# Patient Record
Sex: Female | Born: 1960 | Race: White | Hispanic: No | Marital: Single | Smoking: Current every day smoker
Health system: Southern US, Community
[De-identification: ages and names within clinical notes are randomized; demographics above are authoritative.]

## PROBLEM LIST (undated history)

## (undated) DIAGNOSIS — F329 Major depressive disorder, single episode, unspecified: Secondary | ICD-10-CM

## (undated) DIAGNOSIS — J329 Chronic sinusitis, unspecified: Secondary | ICD-10-CM

## (undated) DIAGNOSIS — T7840XA Allergy, unspecified, initial encounter: Secondary | ICD-10-CM

## (undated) DIAGNOSIS — R1115 Cyclical vomiting syndrome unrelated to migraine: Secondary | ICD-10-CM

## (undated) DIAGNOSIS — N951 Menopausal and female climacteric states: Secondary | ICD-10-CM

## (undated) DIAGNOSIS — F32A Depression, unspecified: Secondary | ICD-10-CM

## (undated) DIAGNOSIS — K219 Gastro-esophageal reflux disease without esophagitis: Secondary | ICD-10-CM

## (undated) DIAGNOSIS — N879 Dysplasia of cervix uteri, unspecified: Secondary | ICD-10-CM

## (undated) DIAGNOSIS — L309 Dermatitis, unspecified: Secondary | ICD-10-CM

## (undated) DIAGNOSIS — G43909 Migraine, unspecified, not intractable, without status migrainosus: Secondary | ICD-10-CM

## (undated) DIAGNOSIS — M5030 Other cervical disc degeneration, unspecified cervical region: Secondary | ICD-10-CM

## (undated) DIAGNOSIS — R112 Nausea with vomiting, unspecified: Secondary | ICD-10-CM

## (undated) DIAGNOSIS — Z9889 Other specified postprocedural states: Secondary | ICD-10-CM

## (undated) DIAGNOSIS — I10 Essential (primary) hypertension: Secondary | ICD-10-CM

## (undated) DIAGNOSIS — M1990 Unspecified osteoarthritis, unspecified site: Secondary | ICD-10-CM

## (undated) HISTORY — DX: Other cervical disc degeneration, unspecified cervical region: M50.30

## (undated) HISTORY — DX: Dysplasia of cervix uteri, unspecified: N87.9

## (undated) HISTORY — DX: Major depressive disorder, single episode, unspecified: F32.9

## (undated) HISTORY — PX: CERVICAL BIOPSY: SHX590

## (undated) HISTORY — DX: Gastro-esophageal reflux disease without esophagitis: K21.9

## (undated) HISTORY — DX: Unspecified osteoarthritis, unspecified site: M19.90

## (undated) HISTORY — PX: COLPOSCOPY: SHX161

## (undated) HISTORY — DX: Depression, unspecified: F32.A

## (undated) HISTORY — DX: Dermatitis, unspecified: L30.9

## (undated) HISTORY — DX: Allergy, unspecified, initial encounter: T78.40XA

## (undated) HISTORY — DX: Chronic sinusitis, unspecified: J32.9

## (undated) HISTORY — PX: KNEE SURGERY: SHX244

## (undated) HISTORY — PX: COLONOSCOPY W/ POLYPECTOMY: SHX1380

## (undated) HISTORY — DX: Migraine, unspecified, not intractable, without status migrainosus: G43.909

## (undated) HISTORY — DX: Menopausal and female climacteric states: N95.1

## (undated) HISTORY — PX: NECK SURGERY: SHX720

---

## 1988-11-18 HISTORY — PX: TONSILLECTOMY: SHX5217

## 1990-11-18 HISTORY — PX: NASAL SINUS SURGERY: SHX719

## 1998-06-16 ENCOUNTER — Other Ambulatory Visit: Admission: RE | Admit: 1998-06-16 | Discharge: 1998-06-16 | Payer: Self-pay | Admitting: *Deleted

## 1999-08-02 ENCOUNTER — Other Ambulatory Visit: Admission: RE | Admit: 1999-08-02 | Discharge: 1999-08-02 | Payer: Self-pay | Admitting: *Deleted

## 2001-05-23 ENCOUNTER — Encounter: Payer: Self-pay | Admitting: Emergency Medicine

## 2001-05-23 ENCOUNTER — Emergency Department (HOSPITAL_COMMUNITY): Admission: EM | Admit: 2001-05-23 | Discharge: 2001-05-23 | Payer: Self-pay

## 2001-05-26 ENCOUNTER — Other Ambulatory Visit: Admission: RE | Admit: 2001-05-26 | Discharge: 2001-05-26 | Payer: Self-pay

## 2002-04-17 ENCOUNTER — Emergency Department (HOSPITAL_COMMUNITY): Admission: EM | Admit: 2002-04-17 | Discharge: 2002-04-17 | Payer: Self-pay | Admitting: Emergency Medicine

## 2002-06-28 ENCOUNTER — Other Ambulatory Visit: Admission: RE | Admit: 2002-06-28 | Discharge: 2002-06-28 | Payer: Self-pay | Admitting: Obstetrics and Gynecology

## 2004-03-12 ENCOUNTER — Emergency Department (HOSPITAL_COMMUNITY): Admission: EM | Admit: 2004-03-12 | Discharge: 2004-03-12 | Payer: Self-pay

## 2005-09-17 ENCOUNTER — Other Ambulatory Visit: Admission: RE | Admit: 2005-09-17 | Discharge: 2005-09-17 | Payer: Self-pay | Admitting: Obstetrics and Gynecology

## 2006-04-29 ENCOUNTER — Other Ambulatory Visit: Admission: RE | Admit: 2006-04-29 | Discharge: 2006-04-29 | Payer: Self-pay | Admitting: Obstetrics and Gynecology

## 2008-06-15 ENCOUNTER — Other Ambulatory Visit: Admission: RE | Admit: 2008-06-15 | Discharge: 2008-06-15 | Payer: Self-pay | Admitting: Obstetrics and Gynecology

## 2008-11-15 ENCOUNTER — Encounter: Admission: RE | Admit: 2008-11-15 | Discharge: 2008-11-15 | Payer: Self-pay | Admitting: Internal Medicine

## 2009-05-30 ENCOUNTER — Ambulatory Visit: Payer: Self-pay | Admitting: Obstetrics and Gynecology

## 2009-07-26 ENCOUNTER — Other Ambulatory Visit: Admission: RE | Admit: 2009-07-26 | Discharge: 2009-07-26 | Payer: Self-pay | Admitting: Obstetrics and Gynecology

## 2009-07-26 ENCOUNTER — Ambulatory Visit: Payer: Self-pay | Admitting: Obstetrics and Gynecology

## 2009-07-26 ENCOUNTER — Encounter: Payer: Self-pay | Admitting: Obstetrics and Gynecology

## 2010-04-26 ENCOUNTER — Ambulatory Visit: Payer: Self-pay | Admitting: Obstetrics and Gynecology

## 2010-08-02 ENCOUNTER — Ambulatory Visit: Payer: Self-pay | Admitting: Obstetrics and Gynecology

## 2010-10-30 ENCOUNTER — Ambulatory Visit: Payer: Self-pay | Admitting: Obstetrics and Gynecology

## 2010-12-05 ENCOUNTER — Ambulatory Visit
Admission: RE | Admit: 2010-12-05 | Discharge: 2010-12-05 | Payer: Self-pay | Source: Home / Self Care | Attending: Obstetrics and Gynecology | Admitting: Obstetrics and Gynecology

## 2010-12-05 ENCOUNTER — Other Ambulatory Visit
Admission: RE | Admit: 2010-12-05 | Discharge: 2010-12-05 | Payer: Self-pay | Source: Home / Self Care | Admitting: Obstetrics and Gynecology

## 2010-12-05 ENCOUNTER — Other Ambulatory Visit: Payer: Self-pay | Admitting: Obstetrics and Gynecology

## 2011-05-10 ENCOUNTER — Other Ambulatory Visit: Payer: Self-pay | Admitting: Obstetrics and Gynecology

## 2011-06-10 ENCOUNTER — Other Ambulatory Visit: Payer: Self-pay

## 2011-06-10 ENCOUNTER — Other Ambulatory Visit: Payer: Self-pay | Admitting: Obstetrics and Gynecology

## 2011-06-10 DIAGNOSIS — Z139 Encounter for screening, unspecified: Secondary | ICD-10-CM

## 2011-06-11 DIAGNOSIS — G43909 Migraine, unspecified, not intractable, without status migrainosus: Secondary | ICD-10-CM | POA: Insufficient documentation

## 2011-06-11 DIAGNOSIS — N951 Menopausal and female climacteric states: Secondary | ICD-10-CM | POA: Insufficient documentation

## 2011-06-12 ENCOUNTER — Encounter: Payer: Self-pay | Admitting: Obstetrics and Gynecology

## 2011-06-21 ENCOUNTER — Encounter: Payer: Self-pay | Admitting: Obstetrics and Gynecology

## 2011-07-08 ENCOUNTER — Other Ambulatory Visit: Payer: Self-pay | Admitting: Obstetrics and Gynecology

## 2011-07-08 DIAGNOSIS — F418 Other specified anxiety disorders: Secondary | ICD-10-CM

## 2011-07-09 NOTE — Telephone Encounter (Signed)
Refill on alprazolam 0.25 #30 called to [facility] [phone] no refills per TF

## 2011-07-10 ENCOUNTER — Other Ambulatory Visit: Payer: Self-pay | Admitting: *Deleted

## 2011-08-20 ENCOUNTER — Other Ambulatory Visit: Payer: Self-pay | Admitting: Gynecology

## 2011-09-26 ENCOUNTER — Ambulatory Visit (HOSPITAL_COMMUNITY): Admit: 2011-09-26 | Payer: Self-pay | Admitting: Gastroenterology

## 2011-09-26 ENCOUNTER — Encounter (HOSPITAL_COMMUNITY): Payer: Self-pay

## 2011-09-26 SURGERY — COLONOSCOPY
Anesthesia: Moderate Sedation

## 2011-10-22 ENCOUNTER — Other Ambulatory Visit: Payer: Self-pay | Admitting: Obstetrics and Gynecology

## 2011-11-15 ENCOUNTER — Other Ambulatory Visit: Payer: Self-pay | Admitting: Obstetrics and Gynecology

## 2011-11-15 NOTE — Telephone Encounter (Signed)
rx called in

## 2011-12-23 ENCOUNTER — Other Ambulatory Visit: Payer: Self-pay | Admitting: Obstetrics and Gynecology

## 2012-01-06 ENCOUNTER — Other Ambulatory Visit (HOSPITAL_COMMUNITY)
Admission: RE | Admit: 2012-01-06 | Discharge: 2012-01-06 | Disposition: A | Discharge status: Home / Self Care | Payer: BC Managed Care – PPO | Source: Ambulatory Visit | Attending: Obstetrics and Gynecology | Admitting: Obstetrics and Gynecology

## 2012-01-06 ENCOUNTER — Ambulatory Visit (INDEPENDENT_AMBULATORY_CARE_PROVIDER_SITE_OTHER): Payer: BC Managed Care – PPO | Admitting: Obstetrics and Gynecology

## 2012-01-06 ENCOUNTER — Encounter: Payer: Self-pay | Admitting: Obstetrics and Gynecology

## 2012-01-06 VITALS — BP 130/90 | Ht 65.0 in | Wt 182.0 lb

## 2012-01-06 DIAGNOSIS — L309 Dermatitis, unspecified: Secondary | ICD-10-CM | POA: Insufficient documentation

## 2012-01-06 DIAGNOSIS — Z01419 Encounter for gynecological examination (general) (routine) without abnormal findings: Secondary | ICD-10-CM | POA: Insufficient documentation

## 2012-01-06 DIAGNOSIS — M8430XA Stress fracture, unspecified site, initial encounter for fracture: Secondary | ICD-10-CM

## 2012-01-06 DIAGNOSIS — Z13820 Encounter for screening for osteoporosis: Secondary | ICD-10-CM

## 2012-01-06 DIAGNOSIS — F329 Major depressive disorder, single episode, unspecified: Secondary | ICD-10-CM | POA: Insufficient documentation

## 2012-01-06 DIAGNOSIS — N879 Dysplasia of cervix uteri, unspecified: Secondary | ICD-10-CM | POA: Insufficient documentation

## 2012-01-06 DIAGNOSIS — J45909 Unspecified asthma, uncomplicated: Secondary | ICD-10-CM | POA: Insufficient documentation

## 2012-01-06 DIAGNOSIS — F32A Depression, unspecified: Secondary | ICD-10-CM | POA: Insufficient documentation

## 2012-01-06 DIAGNOSIS — M84378A Stress fracture, left toe(s), initial encounter for fracture: Secondary | ICD-10-CM

## 2012-01-06 LAB — URINALYSIS W MICROSCOPIC + REFLEX CULTURE
Bilirubin Urine: NEGATIVE
Casts: NONE SEEN
Glucose, UA: NEGATIVE mg/dL
Leukocytes, UA: NEGATIVE
Protein, ur: NEGATIVE mg/dL
WBC, UA: NONE SEEN WBC/hpf (ref <3)
pH: 7 (ref 5.0–8.0)

## 2012-01-06 NOTE — Progress Notes (Signed)
Patient came to see me today for her annual GYN exam. She is much happier on the Evamist then she was on oral estrogen. She uses 3 sprays a day. She does notice when she wakes up that she is more emotional. It goes away when she takes her Evamist in the morning. She is having no bleeding with her progesterone. She is having no vaginal dryness. She just saw her PCP and had all her lab work done. Her blood pressure goes up when she smokes and she is trying to stop again. She is up-to-date on mammograms. She had a bone density 11 years ago after stress fracture of her toe. She has not had one since then. She is having no pelvic pain.  Physical examination: Kennon Portela present. HEENT within normal limits. Neck: Thyroid not large. No masses. Supraclavicular nodes: not enlarged. Breasts: Examined in both sitting midline position. No skin changes and no masses. Abdomen: Soft no guarding rebound or masses or hernia. Pelvic: External: Within normal limits. BUS: Within normal limits. Vaginal:within normal limits. Good estrogen effect. No evidence of cystocele rectocele or enterocele. Cervix: clean. Uterus: Normal size and shape. Adnexa: No masses. Rectovaginal exam: Confirmatory and negative. Extremities: Within normal limits.  Assessment: Menopausal symptoms  Plan: She will do 2 sprays of estrogen in the morning and one at night to try to eliminate the emotional response in the morning. She will continue her progesterone. She was scheduled bone density.

## 2012-01-09 ENCOUNTER — Ambulatory Visit (INDEPENDENT_AMBULATORY_CARE_PROVIDER_SITE_OTHER): Payer: BC Managed Care – PPO

## 2012-01-09 DIAGNOSIS — M949 Disorder of cartilage, unspecified: Secondary | ICD-10-CM

## 2012-01-09 DIAGNOSIS — Z13820 Encounter for screening for osteoporosis: Secondary | ICD-10-CM

## 2012-01-09 DIAGNOSIS — M8580 Other specified disorders of bone density and structure, unspecified site: Secondary | ICD-10-CM

## 2012-01-09 DIAGNOSIS — M899 Disorder of bone, unspecified: Secondary | ICD-10-CM

## 2012-01-14 ENCOUNTER — Other Ambulatory Visit: Payer: Self-pay | Admitting: Gastroenterology

## 2012-01-16 ENCOUNTER — Other Ambulatory Visit: Payer: Self-pay | Admitting: Obstetrics and Gynecology

## 2012-01-20 ENCOUNTER — Other Ambulatory Visit: Payer: Self-pay | Admitting: Obstetrics and Gynecology

## 2012-01-21 NOTE — Telephone Encounter (Signed)
rx called in

## 2012-04-01 ENCOUNTER — Other Ambulatory Visit (INDEPENDENT_AMBULATORY_CARE_PROVIDER_SITE_OTHER): Payer: Self-pay | Admitting: Otolaryngology

## 2012-04-01 DIAGNOSIS — R1310 Dysphagia, unspecified: Secondary | ICD-10-CM

## 2012-04-03 ENCOUNTER — Other Ambulatory Visit (HOSPITAL_COMMUNITY): Payer: BC Managed Care – PPO

## 2012-04-06 ENCOUNTER — Other Ambulatory Visit (HOSPITAL_COMMUNITY): Payer: BC Managed Care – PPO

## 2012-04-10 ENCOUNTER — Ambulatory Visit (HOSPITAL_COMMUNITY)
Admission: RE | Admit: 2012-04-10 | Discharge: 2012-04-10 | Disposition: A | Discharge status: Home / Self Care | Payer: BC Managed Care – PPO | Source: Ambulatory Visit | Attending: Otolaryngology | Admitting: Otolaryngology

## 2012-04-10 DIAGNOSIS — K449 Diaphragmatic hernia without obstruction or gangrene: Secondary | ICD-10-CM | POA: Insufficient documentation

## 2012-04-10 DIAGNOSIS — K224 Dyskinesia of esophagus: Secondary | ICD-10-CM | POA: Insufficient documentation

## 2012-04-10 DIAGNOSIS — R1310 Dysphagia, unspecified: Secondary | ICD-10-CM | POA: Insufficient documentation

## 2012-04-25 ENCOUNTER — Emergency Department (HOSPITAL_COMMUNITY)
Admission: EM | Admit: 2012-04-25 | Discharge: 2012-04-25 | Disposition: A | Discharge status: Home / Self Care | Payer: BC Managed Care – PPO | Attending: Emergency Medicine | Admitting: Emergency Medicine

## 2012-04-25 DIAGNOSIS — R1013 Epigastric pain: Secondary | ICD-10-CM | POA: Insufficient documentation

## 2012-04-25 DIAGNOSIS — F17200 Nicotine dependence, unspecified, uncomplicated: Secondary | ICD-10-CM | POA: Insufficient documentation

## 2012-04-25 DIAGNOSIS — L259 Unspecified contact dermatitis, unspecified cause: Secondary | ICD-10-CM | POA: Insufficient documentation

## 2012-04-25 DIAGNOSIS — G43909 Migraine, unspecified, not intractable, without status migrainosus: Secondary | ICD-10-CM | POA: Insufficient documentation

## 2012-04-25 DIAGNOSIS — Z79899 Other long term (current) drug therapy: Secondary | ICD-10-CM | POA: Insufficient documentation

## 2012-04-25 DIAGNOSIS — J45909 Unspecified asthma, uncomplicated: Secondary | ICD-10-CM | POA: Insufficient documentation

## 2012-04-25 DIAGNOSIS — R1115 Cyclical vomiting syndrome unrelated to migraine: Secondary | ICD-10-CM | POA: Insufficient documentation

## 2012-04-25 LAB — URINE MICROSCOPIC-ADD ON

## 2012-04-25 LAB — CBC
HCT: 46.4 % — ABNORMAL HIGH (ref 36.0–46.0)
Hemoglobin: 16.4 g/dL — ABNORMAL HIGH (ref 12.0–15.0)
MCHC: 35.3 g/dL (ref 30.0–36.0)
RDW: 13.8 % (ref 11.5–15.5)
WBC: 16 10*3/uL — ABNORMAL HIGH (ref 4.0–10.5)

## 2012-04-25 LAB — COMPREHENSIVE METABOLIC PANEL
ALT: 33 U/L (ref 0–35)
AST: 26 U/L (ref 0–37)
Albumin: 4.5 g/dL (ref 3.5–5.2)
Alkaline Phosphatase: 77 U/L (ref 39–117)
CO2: 19 mEq/L (ref 19–32)
Chloride: 99 mEq/L (ref 96–112)
GFR calc non Af Amer: >90 mL/min (ref >90)
Potassium: 3.9 mEq/L (ref 3.5–5.1)
Total Bilirubin: 0.6 mg/dL (ref 0.3–1.2)

## 2012-04-25 LAB — DIFFERENTIAL
Basophils Absolute: 0 10*3/uL (ref 0.0–0.1)
Basophils Relative: 0 % (ref 0–1)
Lymphocytes Relative: 11 % — ABNORMAL LOW (ref 12–46)
Neutro Abs: 13.5 10*3/uL — ABNORMAL HIGH (ref 1.7–7.7)
Neutrophils Relative %: 84 % — ABNORMAL HIGH (ref 43–77)

## 2012-04-25 LAB — URINALYSIS, ROUTINE W REFLEX MICROSCOPIC
Bilirubin Urine: NEGATIVE
Glucose, UA: NEGATIVE mg/dL
Hgb urine dipstick: NEGATIVE
Ketones, ur: >80 mg/dL — AB
Leukocytes, UA: NEGATIVE
pH: 8.5 — ABNORMAL HIGH (ref 5.0–8.0)

## 2012-04-25 MED ORDER — HYDROMORPHONE HCL PF 1 MG/ML IJ SOLN
1.0000 mg | Freq: Once | INTRAMUSCULAR | Status: AC
  Administered 2012-04-25: 1 mg via INTRAVENOUS
  Filled 2012-04-25: qty 1

## 2012-04-25 MED ORDER — PROMETHAZINE HCL 25 MG PO TABS: 25.0000 mg | ORAL_TABLET | Freq: Four times a day (QID) | ORAL | Status: DC | PRN

## 2012-04-25 MED ORDER — SODIUM CHLORIDE 0.9 % IV BOLUS (SEPSIS)
1000.0000 mL | Freq: Once | INTRAVENOUS | Status: AC
  Administered 2012-04-25: 1000 mL via INTRAVENOUS

## 2012-04-25 MED ORDER — ONDANSETRON HCL 4 MG/2ML IJ SOLN
4.0000 mg | Freq: Once | INTRAMUSCULAR | Status: AC
  Administered 2012-04-25: 4 mg via INTRAVENOUS
  Filled 2012-04-25: qty 2

## 2012-04-25 MED ORDER — SODIUM CHLORIDE 0.9 % IV SOLN: Freq: Once | INTRAVENOUS | Status: DC

## 2012-04-25 MED ORDER — PANTOPRAZOLE SODIUM 40 MG IV SOLR
40.0000 mg | Freq: Once | INTRAVENOUS | Status: AC
  Administered 2012-04-25: 40 mg via INTRAVENOUS
  Filled 2012-04-25: qty 40

## 2012-04-25 NOTE — ED Notes (Signed)
Pt reports a history of cyclic vomiting. Pt reports symptoms always present in the same fashion. Pt reports vomiting, nausea and central upper abdominal pain began this AM and has persisted until now. Pt actively vomiting. Pt diaphoretic, tachypenic and anxious. Pt stating "please help me" and reports feeling weak. Pt reports dilaudid and an antinausea medication are the only thing that help her symptoms. Pt denies taking medications for pain or nausea prior to arrival at hospital. Pt reports her diagnosis of cyclic vomiting was made after ruling out pancreatitis.

## 2012-04-25 NOTE — ED Provider Notes (Signed)
History     CSN: [id number]  Arrival date & time 04/25/12  1356   First MD Initiated Contact with Patient 04/25/12 1424      Chief Complaint  Patient presents with  . Emesis    "cyclic vomiting"    (Consider location/radiation/quality/duration/timing/severity/associated sxs/prior treatment) Patient is a 51 y.o. female presenting with vomiting. The history is provided by the patient.  Emesis   She had onset this morning of epigastric pain and vomiting with diaphoresis. Pain is moderate to severe and she rates it at 8/10. She denies diarrhea. This is typical of a flare up of her cyclic vomiting syndrome. She has not had a flareup in quite some time. She has not taken anything to try and help her symptoms. Nothing makes her feel better and nothing makes her feel worse. Epigastric pain does radiate to the back and into her chest.  Past Medical History  Diagnosis Date  . Menopausal symptoms   . Migraines   . Recurrent sinus infections   . Depression   . Asthma   . Eczema   . Cervical dysplasia     Past Surgical History  Procedure Date  . Tonsillectomy 1990  . Nasal sinus surgery 1992  . Colposcopy     Family History  Problem Relation Age of Onset  . Heart disease Mother   . Hypertension Father   . Breast cancer Maternal Aunt   . Diabetes Paternal Grandfather   . Heart disease Paternal Grandfather     History  Substance Use Topics  . Smoking status: Current Everyday Smoker -- 1.5 packs/day    Types: Cigarettes  . Smokeless tobacco: Not on file  . Alcohol Use: Yes     rare    OB History    Grav Para Term Preterm Abortions TAB SAB Ect Mult Living   1    1           Review of Systems  Gastrointestinal: Positive for vomiting.  All other systems reviewed and are negative.    Allergies  Avelox  Home Medications   Current Outpatient Rx  Name Route Sig Dispense Refill  . ALPRAZOLAM 0.25 MG PO TABS  TAKE ONE TABLET BY MOUTH EVERY 8 HOURS AS NEEDED 30 tablet  3  . CARISOPRODOL 350 MG PO TABS Oral Take 350 mg by mouth as needed.    Marland Kitchen ZYRTEC PO Oral Take by mouth.      . ESTRADIOL 2 MG PO TABS Oral Take 2 mg by mouth daily.      Marland Kitchen EVAMIST 1.53 MG/SPRAY TD SOLN  USE 3 SPRAYS DAILY AS DIRECTED ON INSIDE OF FOREARM 9 mL 11  . MEDROXYPROGESTERONE ACETATE 5 MG PO TABS Oral Take 5 mg by mouth daily. Daily day 1-12 of every month.     Marland Kitchen NICOTINE POLACRILEX 4 MG MT GUM Oral Take 4 mg by mouth as needed.      Marland Kitchen PERCOCET PO Oral Take by mouth. prn     . SUDAFED 12 HOUR PO Oral Take by mouth.    . IMITREX PO Oral Take by mouth.      . VENLAFAXINE HCL 75 MG PO TABS Oral Take 75 mg by mouth 2 (two) times daily.       BP 142/81  Pulse 112  Temp(Src) 98.4 F (36.9 C) (Oral)  Resp 22  Ht 5\' 5"  (1.651 m)  Wt 185 lb (83.915 kg)  BMI 30.79 kg/m2  SpO2 100%  Physical Exam  Nursing  note and vitals reviewed.  51 year old female who appears uncomfortable. Vital signs are significant for tachycardia with heart rate of 112, tachypnea with respiratory rate of 22, and borderline systolic hypertension with blood pressure 142/81. Oxygen saturation is 100% which is normal. Head is normocephalic and atraumatic. PERRLA, EOMI. There is no scleral icterus. Neck is nontender and supple. Back is nontender. Lungs are clear without rales, wheezes, rhonchi. Heart has regular rate rhythm without murmur. Abdomen is soft, flat, with mild epigastric tenderness. There is no rebound or guarding. There is no hepatosplenomegaly. Peristalsis is decreased. There's no CVA tenderness. Extremities have no cyanosis or edema, full range of motion is present. Skin is warm and diaphoretic without rash. Neurologic: She is anxious and appears to be in pain. Cranial nerves are intact. There no motor or sensory deficits.  ED Course  Procedures (including critical care time)  Results for orders placed during the hospital encounter of 04/25/12  CBC      Component Value Range   WBC 16.0 (*) 4.0 - 10.5  (K/uL)   RBC 5.42 (*) 3.87 - 5.11 (MIL/uL)   Hemoglobin 16.4 (*) 12.0 - 15.0 (g/dL)   HCT 16.1 (*) 09.6 - 46.0 (%)   MCV 85.6  78.0 - 100.0 (fL)   MCH 30.3  26.0 - 34.0 (pg)   MCHC 35.3  30.0 - 36.0 (g/dL)   RDW 04.5  40.9 - 81.1 (%)   Platelets 332  150 - 400 (K/uL)  DIFFERENTIAL      Component Value Range   Neutrophils Relative 84 (*) 43 - 77 (%)   Neutro Abs 13.5 (*) 1.7 - 7.7 (K/uL)   Lymphocytes Relative 11 (*) 12 - 46 (%)   Lymphs Abs 1.7  0.7 - 4.0 (K/uL)   Monocytes Relative 3  3 - 12 (%)   Monocytes Absolute 0.5  0.1 - 1.0 (K/uL)   Eosinophils Relative 2  0 - 5 (%)   Eosinophils Absolute 0.3  0.0 - 0.7 (K/uL)   Basophils Relative 0  0 - 1 (%)   Basophils Absolute 0.0  0.0 - 0.1 (K/uL)  COMPREHENSIVE METABOLIC PANEL      Component Value Range   Sodium 135  135 - 145 (mEq/L)   Potassium 3.9  3.5 - 5.1 (mEq/L)   Chloride 99  96 - 112 (mEq/L)   CO2 19  19 - 32 (mEq/L)   Glucose, Bld 109 (*) 70 - 99 (mg/dL)   BUN 11  6 - 23 (mg/dL)   Creatinine, Ser 9.14  0.50 - 1.10 (mg/dL)   Calcium 9.9  8.4 - 78.2 (mg/dL)   Total Protein 7.9  6.0 - 8.3 (g/dL)   Albumin 4.5  3.5 - 5.2 (g/dL)   AST 26  0 - 37 (U/L)   ALT 33  0 - 35 (U/L)   Alkaline Phosphatase 77  39 - 117 (U/L)   Total Bilirubin 0.6  0.3 - 1.2 (mg/dL)   GFR calc non Af Amer >90  >90 (mL/min)   GFR calc Af Amer >90  >90 (mL/min)  LIPASE, BLOOD      Component Value Range   Lipase 13  11 - 59 (U/L)  URINALYSIS, ROUTINE W REFLEX MICROSCOPIC      Component Value Range   Color, Urine YELLOW  YELLOW    APPearance CLEAR  CLEAR    Specific Gravity, Urine 1.024  1.005 - 1.030    pH 8.5 (*) 5.0 - 8.0    Glucose, UA NEGATIVE  NEGATIVE (mg/dL)   Hgb urine dipstick NEGATIVE  NEGATIVE    Bilirubin Urine NEGATIVE  NEGATIVE    Ketones, ur >80 (*) NEGATIVE (mg/dL)   Protein, ur 30 (*) NEGATIVE (mg/dL)   Urobilinogen, UA 0.2  0.0 - 1.0 (mg/dL)   Nitrite NEGATIVE  NEGATIVE    Leukocytes, UA NEGATIVE  NEGATIVE   URINE  MICROSCOPIC-ADD ON      Component Value Range   Squamous Epithelial / LPF RARE  RARE    WBC, UA 0-2  <3 (WBC/hpf)   RBC / HPF 0-2  <3 (RBC/hpf)   Bacteria, UA RARE  RARE    Urine-Other MUCOUS PRESENT         1. Cyclic vomiting syndrome       MDM  Apparent exacerbation of cyclic vomiting syndrome. I reviewed her records and do not find any prior visits for same although she states that she is always a comes to the [facility] cone system for her flareups.  After IV fluids, IV ondansetron, IV pantoprazole, and IV hydromorphone, she feels much better and wants to try some oral fluids.  Laboratory workup is unremarkable. She will be sent home with a prescription for promethazine tablets.    Dione Booze, MD 04/25/12 [phone]

## 2012-04-25 NOTE — Discharge Instructions (Signed)
Cyclic Vomiting Syndrome Cyclic vomiting syndrome (CVS) is a benign condition in which patients experience bouts or cycles of severe nausea and vomiting that last for hours or even days. The bouts of nausea and vomiting alternate with longer periods of no symptoms and generally good health. CVS occurs mostly in children, but can affect adults. CVS has no known cause. Each episode is typically similar to the previous ones. The episodes tend to:   Start at about the same time of day.   Last the same length of time.   Present the same symptoms at the same level of intensity.  CVS can begin at any age in children and adults. CVS usually starts between the ages of 29 and 34. In adults, episodes tend to occur less often than they do in children, but they last longer. Furthermore, the events or situations that trigger episodes in adults cannot always be pinpointed as easily as they can in children. THE FOUR PHASES OF CVS 1. Prodrome.  2. Episode.  3. Recovery.  4. Symptom-free interval.  The prodrome phase signals that an episode of nausea and vomiting is about to begin. This phase can last from just a few minutes to several hours. This phase is often marked by belly (abdominal) pain. Sometimes taking medicine early in the prodrome phase can stop an episode in progress. However, sometimes there is no warning. A person may simply wake up in the middle of the night or early morning and begin vomiting. The episode phase consists of:  Severe vomiting.   Nausea.   Gagging (retching).  The recovery phase begins when the nausea and vomiting stop. Healthy color, appetite, and energy return. The symptom-free interval phase is the period between episodes when no symptoms are present. TRIGGERS Episodes can be triggered by an infection or event. Examples of triggers include:  Infections.   Colds, allergies, sinus problems, and the flu.   Eating certain foods such as chocolate or cheese.   Foods with MSG  or preservatives.   Fast foods.   Pre-packaged foods.   Foods with low nutritional value (junk foods).   Overeating.   Eating just before going to bed.   Hot weather.   Dehydration.   Not enough sleep or poor sleep quality.   Physical exhaustion.   Menstruation.   Motion sickness.   Emotional stress (school or home difficulties).   Excitement or stress.  SYMPTOMS  The main symptoms of CVS are:  Severe vomiting.   Nausea.   Gagging (retching).  Episodes usually begin at night or the first thing in the morning. Episodes may include vomiting or retching up to 5 or 6 times an hour during the worst of the episode. Episodes usually last anywhere from 1 to 4 days. Episodes can last for up to 10 days. Other symptoms include:  Paleness.   Exhaustion.   Listlessness.   Abdominal pain.   Loose stools or diarrhea.  Sometimes the nausea and vomiting are so severe that a person appears to be almost unconscious. Sensitivity to light, headache, fever, dizziness, may also accompany an episode. In addition, the vomiting may cause drooling and excessive thirst. Drinking water usually leads to more vomiting, though the water can dilute the acid in the vomit, making the episode a little less painful. Continuous vomiting can lead to dehydration, which means that the body has lost excessive water and salts. DIAGNOSIS  CVS is hard to diagnose because there are no clear tests to identify it. A caregiver must  diagnose CVS by looking at symptoms and medical history. A caregiver must exclude more common diseases or disorders that can also cause nausea and vomiting. Also, diagnosis takes time because caregivers need to identify a pattern or cycle to the vomiting. CVS AND MIGRAINE The relationship between migraine and CVS is still unclear. Medical researchers believe that they are related for 3 reasons: 1. Migraine headaches (which cause severe pain in the head), abdominal migraine (which causes  stomach pain), and CVS are all marked by severe symptoms that start quickly and end abruptly, followed by longer periods without pain or other symptoms.  2. Many of the situations that trigger CVS also trigger migraines. Those triggers include stress and excitement.  3. Research has shown that many children with CVS either have a family history of migraine or develop migraines as they grow older.  Because of the similarities between migraine and CVS, caregivers treat some people with severe CVS with drugs that are also used for migraine headaches. The drugs are designed to:  Prevent episodes.   Reduce their frequency.   Lessen their severity.  TREATMENT  CVS cannot be cured. Treatment varies, but people with CVS should get plenty of rest and sleep and take medications that prevent, stop, or lessen the vomiting episodes and other symptoms. Once a vomiting episode begins, treatment is supportive. It helps to stay in bed and sleep in a dark, quiet room. Severe nausea and vomiting may require hospitalization and intravenous (IV) fluids to prevent dehydration. Relaxing medications (sedatives) may help if the nausea continues. Sometimes, during the prodrome phase, it is possible to stop an episode from happening altogether. Only take over-the-counter or prescription medicines for pain, discomfort or fever as directed by your caregiver. Do not give aspirin to children. During the recovery phase, drinking water and replacing lost electrolytes (salts in the blood) are very important. Electrolytes are salts that the body needs to function well and stay healthy. Symptoms during the recovery phase can vary. Some people find that their appetites return to normal immediately, while others need to begin by drinking clear liquids and then move slowly to solid food. People whose episodes are frequent and long-lasting may be treated during the symptom-free intervals in an effort to prevent or ease future episodes.  Medications that help people with migraine headaches are sometimes used during this phase, but they do not work for everyone. Taking the medicine daily for 1 to 2 months may be necessary to see if it helps. The symptom-free phase is a good time to eliminate anything known to trigger an episode. For example, if episodes are brought on by stress or excitement, this period is the time to find ways to reduce stress and stay calm. If sinus problems or allergies cause episodes, those conditions should be treated. The triggers listed above should be avoided or prevented. RELATED COMPLICATIONS The severe vomiting that defines CVS is a risk factor for several complications:  Dehydration. Vomiting causes the body to lose water quickly.   Electrolyte imbalance. Vomiting also causes the body to lose the important salts it needs to keep working properly.   Peptic esophagitis. The tube that connects the mouth to the stomach (esophagus) becomes injured from the stomach acid that comes up with the vomit.   Hematemesis. The esophagus becomes irritated and bleeds, so blood mixes with the vomit.   Mallory-Weiss tear. The lower end of the esophagus may tear open or the stomach may bruise from vomiting or retching.   Tooth decay.  The acid in the vomit can hurt the teeth by corroding the tooth enamel.  SEEK MEDICAL CARE IF: You have questions or problems. Document Released: 01/13/2002 Document Revised: 10/24/2011 Document Reviewed: 02/11/2011 [facility] Patient Information 2012 [place], [place].  Promethazine tablets What is this medicine? PROMETHAZINE (proe METH a zeen) is an antihistamine. It is used to treat allergic reactions and to treat or prevent nausea and vomiting from illness or motion sickness. It is also used to make you sleep before surgery, and to help treat pain or nausea after surgery. This medicine may be used for other purposes; ask your health care provider or pharmacist if you have questions. What  should I tell my health care provider before I take this medicine? They need to know if you have any of these conditions: -glaucoma -high blood pressure or heart disease -kidney disease -liver disease -lung or breathing disease, like asthma -prostate trouble -pain or difficulty passing urine -seizures -an unusual or allergic reaction to promethazine or phenothiazines, other medicines, foods, dyes, or preservatives -pregnant or trying to get pregnant -breast-feeding How should I use this medicine? Take this medicine by mouth with a glass of water. Follow the directions on the prescription label. Take your doses at regular intervals. Do not take your medicine more often than directed. Talk to your pediatrician regarding the use of this medicine in children. Special care may be needed. This medicine should not be given to infants and children younger than 54 years old. Overdosage: If you think you have taken too much of this medicine contact a poison control center or emergency room at once. NOTE: This medicine is only for you. Do not share this medicine with others. What if I miss a dose? If you miss a dose, take it as soon as you can. If it is almost time for your next dose, take only that dose. Do not take double or extra doses. What may interact with this medicine? Do not take this medicine with any of the following medications: -medicines called MAO Inhibitors like Nardil, Parnate, Marplan, Eldepryl -other phenothiazines like trimethobenzamide This medicine may also interact with the following medications: -barbiturates like phenobarbital -bromocriptine -certain antidepressants -certain antihistamines used in allergy or cold medicines -epinephrine -levodopa -medicines for sleep -medicines for mental problems and psychotic disturbances -medicines for movement abnormalities as in Parkinson's disease, or for gastrointestinal problems -muscle relaxants -prescription pain  medicines This list may not describe all possible interactions. Give your health care provider a list of all the medicines, herbs, non-prescription drugs, or dietary supplements you use. Also tell them if you smoke, drink alcohol, or use illegal drugs. Some items may interact with your medicine. What should I watch for while using this medicine? Tell your doctor or health care professional if your symptoms do not start to get better in 1 to 2 days. You may get drowsy or dizzy. Do not drive, use machinery, or do anything that needs mental alertness until you know how this medicine affects you. To reduce the risk of dizzy or fainting spells, do not stand or sit up quickly, especially if you are an older patient. Alcohol may increase dizziness and drowsiness. Avoid alcoholic drinks. Your mouth may get dry. Chewing sugarless gum or sucking hard candy, and drinking plenty of water may help. Contact your doctor if the problem does not go away or is severe. This medicine may cause dry eyes and blurred vision. If you wear contact lenses you may feel some discomfort. Lubricating drops may help.  See your eye doctor if the problem does not go away or is severe. This medicine can make you more sensitive to the sun. Keep out of the sun. If you cannot avoid being in the sun, wear protective clothing and use sunscreen. Do not use sun lamps or tanning beds/booths. If you are diabetic, check your blood-sugar levels regularly. What side effects may I notice from receiving this medicine? Side effects that you should report to your doctor or health care professional as soon as possible: -blurred vision -irregular heartbeat, palpitations or chest pain -muscle or facial twitches -pain or difficulty passing urine -seizures -skin rash -slowed or shallow breathing -unusual bleeding or bruising -yellowing of the eyes or skin Side effects that usually do not require medical attention (report to your doctor or health care  professional if they continue or are bothersome): -headache -nightmares, agitation, nervousness, excitability, not able to sleep (these are more likely in children) -stuffy nose This list may not describe all possible side effects. Call your doctor for medical advice about side effects. You may report side effects to FDA at 1-800-FDA-1088. Where should I keep my medicine? Keep out of the reach of children. Store at room temperature, between 20 and 25 degrees C (68 and 77 degrees F). Protect from light. Throw away any unused medicine after the expiration date. NOTE: This sheet is a summary. It may not cover all possible information. If you have questions about this medicine, talk to your doctor, pharmacist, or health care provider.  2012, [employer]. (06/02/2008 12:05:26 PM)

## 2012-04-25 NOTE — ED Notes (Signed)
Started this am with N/V and upper abd. Pain. Chills and diaphoresis. Previously has been dx'd as "cyclic vomiting".

## 2012-05-25 ENCOUNTER — Other Ambulatory Visit: Payer: Self-pay | Admitting: *Deleted

## 2012-05-25 MED ORDER — ALPRAZOLAM 0.25 MG PO TABS: 0.2500 mg | ORAL_TABLET | Freq: Three times a day (TID) | ORAL | Status: DC | PRN

## 2012-05-25 NOTE — Telephone Encounter (Signed)
rx called in

## 2012-08-28 ENCOUNTER — Other Ambulatory Visit: Payer: Self-pay | Admitting: Obstetrics and Gynecology

## 2012-08-28 MED ORDER — ALPRAZOLAM 0.25 MG PO TABS: 0.2500 mg | ORAL_TABLET | Freq: Three times a day (TID) | ORAL | Status: DC | PRN

## 2012-08-28 NOTE — Telephone Encounter (Signed)
Refills called into pharmacy

## 2012-10-09 ENCOUNTER — Ambulatory Visit: Payer: BC Managed Care – PPO | Admitting: Obstetrics and Gynecology

## 2012-11-11 ENCOUNTER — Encounter (HOSPITAL_COMMUNITY): Payer: Self-pay | Admitting: Emergency Medicine

## 2012-11-11 ENCOUNTER — Emergency Department (HOSPITAL_COMMUNITY)
Admission: EM | Admit: 2012-11-11 | Discharge: 2012-11-11 | Disposition: A | Discharge status: Home / Self Care | Payer: BC Managed Care – PPO | Attending: Emergency Medicine | Admitting: Emergency Medicine

## 2012-11-11 DIAGNOSIS — J45909 Unspecified asthma, uncomplicated: Secondary | ICD-10-CM | POA: Insufficient documentation

## 2012-11-11 DIAGNOSIS — Z872 Personal history of diseases of the skin and subcutaneous tissue: Secondary | ICD-10-CM | POA: Insufficient documentation

## 2012-11-11 DIAGNOSIS — R6883 Chills (without fever): Secondary | ICD-10-CM | POA: Insufficient documentation

## 2012-11-11 DIAGNOSIS — F3289 Other specified depressive episodes: Secondary | ICD-10-CM | POA: Insufficient documentation

## 2012-11-11 DIAGNOSIS — Z79899 Other long term (current) drug therapy: Secondary | ICD-10-CM | POA: Insufficient documentation

## 2012-11-11 DIAGNOSIS — R1115 Cyclical vomiting syndrome unrelated to migraine: Secondary | ICD-10-CM | POA: Insufficient documentation

## 2012-11-11 DIAGNOSIS — F329 Major depressive disorder, single episode, unspecified: Secondary | ICD-10-CM | POA: Insufficient documentation

## 2012-11-11 DIAGNOSIS — F17200 Nicotine dependence, unspecified, uncomplicated: Secondary | ICD-10-CM | POA: Insufficient documentation

## 2012-11-11 DIAGNOSIS — Z8679 Personal history of other diseases of the circulatory system: Secondary | ICD-10-CM | POA: Insufficient documentation

## 2012-11-11 DIAGNOSIS — Z8619 Personal history of other infectious and parasitic diseases: Secondary | ICD-10-CM | POA: Insufficient documentation

## 2012-11-11 DIAGNOSIS — R61 Generalized hyperhidrosis: Secondary | ICD-10-CM | POA: Insufficient documentation

## 2012-11-11 DIAGNOSIS — Z8742 Personal history of other diseases of the female genital tract: Secondary | ICD-10-CM | POA: Insufficient documentation

## 2012-11-11 LAB — BASIC METABOLIC PANEL
Chloride: 99 mEq/L (ref 96–112)
GFR calc Af Amer: >90 mL/min (ref >90)
GFR calc non Af Amer: 84 mL/min — ABNORMAL LOW (ref >90)
Glucose, Bld: 145 mg/dL — ABNORMAL HIGH (ref 70–99)
Potassium: 3 mEq/L — ABNORMAL LOW (ref 3.5–5.1)
Sodium: 137 mEq/L (ref 135–145)

## 2012-11-11 LAB — CBC WITH DIFFERENTIAL/PLATELET
Basophils Relative: 0 % (ref 0–1)
Eosinophils Absolute: 0.2 10*3/uL (ref 0.0–0.7)
Lymphs Abs: 2.1 10*3/uL (ref 0.7–4.0)
MCH: 30 pg (ref 26.0–34.0)
Neutro Abs: 11.8 10*3/uL — ABNORMAL HIGH (ref 1.7–7.7)
Neutrophils Relative %: 79 % — ABNORMAL HIGH (ref 43–77)
Platelets: 289 10*3/uL (ref 150–400)
RBC: 5.07 MIL/uL (ref 3.87–5.11)

## 2012-11-11 LAB — URINALYSIS, ROUTINE W REFLEX MICROSCOPIC
Nitrite: NEGATIVE
Specific Gravity, Urine: 1.023 (ref 1.005–1.030)
Urobilinogen, UA: 0.2 mg/dL (ref 0.0–1.0)

## 2012-11-11 LAB — URINE MICROSCOPIC-ADD ON

## 2012-11-11 MED ORDER — ONDANSETRON HCL 4 MG/2ML IJ SOLN
4.0000 mg | Freq: Once | INTRAMUSCULAR | Status: AC
  Administered 2012-11-11: 4 mg via INTRAVENOUS
  Filled 2012-11-11: qty 2

## 2012-11-11 MED ORDER — ONDANSETRON HCL 4 MG PO TABS: 4.0000 mg | ORAL_TABLET | Freq: Four times a day (QID) | ORAL | Status: DC

## 2012-11-11 MED ORDER — HYDROMORPHONE HCL PF 1 MG/ML IJ SOLN
1.0000 mg | Freq: Once | INTRAMUSCULAR | Status: AC
  Administered 2012-11-11: 1 mg via INTRAVENOUS
  Filled 2012-11-11: qty 1

## 2012-11-11 MED ORDER — POTASSIUM CHLORIDE CRYS ER 20 MEQ PO TBCR
40.0000 meq | EXTENDED_RELEASE_TABLET | Freq: Once | ORAL | Status: AC
  Administered 2012-11-11: 40 meq via ORAL
  Filled 2012-11-11: qty 2

## 2012-11-11 MED ORDER — SODIUM CHLORIDE 0.9 % IV BOLUS (SEPSIS)
1000.0000 mL | Freq: Once | INTRAVENOUS | Status: AC
  Administered 2012-11-11: 1000 mL via INTRAVENOUS

## 2012-11-11 MED ORDER — PANTOPRAZOLE SODIUM 40 MG IV SOLR
40.0000 mg | Freq: Once | INTRAVENOUS | Status: AC
  Administered 2012-11-11: 40 mg via INTRAVENOUS
  Filled 2012-11-11: qty 40

## 2012-11-11 NOTE — ED Provider Notes (Signed)
Medical screening examination/treatment/procedure(s) were performed by non-physician practitioner and as supervising physician I was immediately available for consultation/collaboration. Devoria Albe, MD, [facility]   Ward Givens, MD 11/11/12 [phone]

## 2012-11-11 NOTE — ED Notes (Signed)
Tolerated ginger ale and crackers, no problems swallowing or n/v.

## 2012-11-11 NOTE — ED Provider Notes (Signed)
History     CSN: [id number]  Arrival date & time 11/11/12  0355   First MD Initiated Contact with Patient 11/11/12 0444      Chief Complaint  Patient presents with  . Emesis   HPI  History provided by the patient. Patient is a 51 year old female with past history of cyclic vomiting syndrome who presents with complaints of multiple episodes of nausea and vomiting. Patient reports having a long history of similar cyclic vomiting episodes. She reports this has been doing well and generally only affects her 1-2 times a year. Symptoms began earlier in the day and through the night. Patient did try using some of her over-the-counter antiemetic medications without improvement. She has not been able to tolerate anything to drink. Symptoms are associated with sweats and chills. She denies having any fever. Denies any diarrhea symptoms. Denies any recent travel or known sick contacts.   Past Medical History  Diagnosis Date  . Menopausal symptoms   . Migraines   . Recurrent sinus infections   . Depression   . Asthma   . Eczema   . Cervical dysplasia     Past Surgical History  Procedure Date  . Tonsillectomy 1990  . Nasal sinus surgery 1992  . Colposcopy     Family History  Problem Relation Age of Onset  . Heart disease Mother   . Hypertension Father   . Breast cancer Maternal Aunt   . Diabetes Paternal Grandfather   . Heart disease Paternal Grandfather     History  Substance Use Topics  . Smoking status: Current Every Day Smoker -- 1.5 packs/day    Types: Cigarettes  . Smokeless tobacco: Not on file  . Alcohol Use: Yes     Comment: rare    OB History    Grav Para Term Preterm Abortions TAB SAB Ect Mult Living   1    1           Review of Systems  Constitutional: Positive for chills and diaphoresis.  Respiratory: Negative for cough and shortness of breath.   Cardiovascular: Negative for chest pain and palpitations.  Gastrointestinal: Positive for nausea and  vomiting. Negative for abdominal pain, diarrhea and constipation.  All other systems reviewed and are negative.    Allergies  Avelox  Home Medications   Current Outpatient Rx  Name  Route  Sig  Dispense  Refill  . ALPRAZOLAM 0.25 MG PO TABS   Oral   Take 1 tablet (0.25 mg total) by mouth 3 (three) times daily as needed. Anxiety or sleep.   30 tablet   1   . CETIRIZINE HCL 10 MG PO TABS   Oral   Take 10 mg by mouth daily.         Marland Kitchen EVAMIST 1.53 MG/SPRAY TD SOLN      USE 3 SPRAYS DAILY AS DIRECTED ON INSIDE OF FOREARM   9 mL   11   . SUMATRIPTAN SUCCINATE 100 MG PO TABS   Oral   Take 100 mg by mouth every 2 (two) hours as needed. For migraines         . VENLAFAXINE HCL 75 MG PO TABS   Oral   Take 75 mg by mouth daily.            BP 139/85  Pulse 49  Temp 98.6 F (37 C) (Oral)  Resp 20  Ht 5\' 5"  (1.651 m)  Wt 175 lb (79.379 kg)  BMI 29.12 kg/m2  SpO2  100%  Physical Exam  Nursing note and vitals reviewed. Constitutional: She is oriented to person, place, and time. She appears well-developed and well-nourished.  HENT:  Head: Normocephalic and atraumatic.  Cardiovascular: Normal rate and regular rhythm.   No murmur heard. Pulmonary/Chest: Effort normal and breath sounds normal. No respiratory distress. She has no wheezes. She has no rales.  Abdominal: Soft. There is no tenderness. There is no rebound and no guarding.  Neurological: She is alert and oriented to person, place, and time.  Skin: Skin is warm. She is diaphoretic.  Psychiatric: She has a normal mood and affect. Her behavior is normal.    ED Course  Procedures   Results for orders placed during the hospital encounter of 11/11/12  CBC WITH DIFFERENTIAL      Component Value Range   WBC 15.0 (*) 4.0 - 10.5 K/uL   RBC 5.07  3.87 - 5.11 MIL/uL   Hemoglobin 15.2 (*) 12.0 - 15.0 g/dL   HCT 40.9  81.1 - 91.4 %   MCV 85.2  78.0 - 100.0 fL   MCH 30.0  26.0 - 34.0 pg   MCHC 35.2  30.0 - 36.0  g/dL   RDW 78.2  95.6 - 21.3 %   Platelets 289  150 - 400 K/uL   Neutrophils Relative 79 (*) 43 - 77 %   Neutro Abs 11.8 (*) 1.7 - 7.7 K/uL   Lymphocytes Relative 14  12 - 46 %   Lymphs Abs 2.1  0.7 - 4.0 K/uL   Monocytes Relative 6  3 - 12 %   Monocytes Absolute 0.9  0.1 - 1.0 K/uL   Eosinophils Relative 1  0 - 5 %   Eosinophils Absolute 0.2  0.0 - 0.7 K/uL   Basophils Relative 0  0 - 1 %   Basophils Absolute 0.0  0.0 - 0.1 K/uL  BASIC METABOLIC PANEL      Component Value Range   Sodium 137  135 - 145 mEq/L   Potassium 3.0 (*) 3.5 - 5.1 mEq/L   Chloride 99  96 - 112 mEq/L   CO2 19  19 - 32 mEq/L   Glucose, Bld 145 (*) 70 - 99 mg/dL   BUN 15  6 - 23 mg/dL   Creatinine, Ser 0.86  0.50 - 1.10 mg/dL   Calcium 57.8  8.4 - 46.9 mg/dL   GFR calc non Af Amer 84 (*) >90 mL/min   GFR calc Af Amer >90  >90 mL/min        1. Cyclic vomiting syndrome       MDM  5:15 AM patient seen and evaluated. Patient reports improvement of symptoms after fluids and medications. Still mildly diaphoretic.  Slight hypokalemia. Potassium ordered.  Patient discussed in sign out with Johnnette Gourd PA-C. She will continue to follow patient's symptoms.     Angus Seller, [place] 11/11/12 [phone]

## 2012-11-11 NOTE — ED Provider Notes (Signed)
Medical screening examination/treatment/procedure(s) were performed by non-physician practitioner and as supervising physician I was immediately available for consultation/collaboration. Devoria Albe, MD, Armando Gang   Ward Givens, MD 11/11/12 [phone]

## 2012-11-11 NOTE — ED Notes (Signed)
Pt has periods of cyclic vomiting and she has a prescription that is written by [facility] and it says she is to receive Zofran and Dilaudid

## 2012-11-11 NOTE — ED Provider Notes (Signed)
Care assumed from [facility], PA-C. Patient has a history of cyclic vomiting syndrome. Plan is to continue to manage patient's symptoms with hopes of discharging home. Potassium ordered due to mild hypokalemia. Will try PO challenge prior to discharge. 7:17 AM Patient states she is feeling better than she did when she first came in. She has had half of her potassium pill and about to take the other half. Giving PO challenge with ginger ale. If she can tolerate this, will give crackers. 7:58 AM Patient able to tolerate both crackers and ginger ale. Will walk her and check HR which has been low. 8:05 AM HR 89-94 when walking. Patient stable for discharge.  Trevor Mace, PA-C 11/11/12 [phone]

## 2012-12-01 ENCOUNTER — Other Ambulatory Visit: Payer: Self-pay | Admitting: Gynecology

## 2012-12-01 ENCOUNTER — Other Ambulatory Visit: Payer: Self-pay

## 2012-12-01 MED ORDER — ALPRAZOLAM 0.25 MG PO TABS: 0.2500 mg | ORAL_TABLET | Freq: Three times a day (TID) | ORAL | Status: DC | PRN

## 2012-12-01 NOTE — Telephone Encounter (Signed)
Patient originally received Xanax RX in August 2010 for anxiety.  She has been getting refills and Rx's since that date.

## 2012-12-01 NOTE — Telephone Encounter (Signed)
Refill for the patient for Xanax was e-prescribe. She is due for [facility] in February. Please schedule.

## 2012-12-01 NOTE — Telephone Encounter (Signed)
Needs annual exam in February

## 2012-12-02 NOTE — Telephone Encounter (Signed)
Refills called in to pharmacy.  Asked Cecil Cranker. To call her to schedule CE for Feb.

## 2013-03-11 ENCOUNTER — Ambulatory Visit: Payer: BC Managed Care – PPO | Admitting: Physician Assistant

## 2013-03-11 VITALS — BP 145/87 | HR 71 | Temp 98.0°F | Resp 18 | Ht 65.0 in | Wt 185.0 lb

## 2013-03-11 DIAGNOSIS — J019 Acute sinusitis, unspecified: Secondary | ICD-10-CM

## 2013-03-11 DIAGNOSIS — J309 Allergic rhinitis, unspecified: Secondary | ICD-10-CM

## 2013-03-11 MED ORDER — ALBUTEROL SULFATE HFA 108 (90 BASE) MCG/ACT IN AERS: 2.0000 | INHALATION_SPRAY | Freq: Four times a day (QID) | RESPIRATORY_TRACT | Status: AC | PRN

## 2013-03-11 MED ORDER — AZELASTINE HCL 0.1 % NA SOLN: 1.0000 | Freq: Two times a day (BID) | NASAL | Status: DC

## 2013-03-11 MED ORDER — PREDNISONE 20 MG PO TABS: ORAL_TABLET | ORAL | Status: DC

## 2013-03-11 MED ORDER — MONTELUKAST SODIUM 10 MG PO TABS: 10.0000 mg | ORAL_TABLET | Freq: Every day | ORAL | Status: DC

## 2013-03-11 MED ORDER — AMOXICILLIN 500 MG PO CAPS: 500.0000 mg | ORAL_CAPSULE | Freq: Three times a day (TID) | ORAL | Status: DC

## 2013-03-11 NOTE — Progress Notes (Signed)
Patient ID: Katie Chang MRN: [id number], DOB: [birth year], 52 y.o. Date of Encounter: 03/11/2013, 11:51 AM  Primary Physician: Charolett Bumpers, MD  Chief Complaint: Allergic rhinitis  HPI: 52 y.o. female with history below presents with flare up of allergic rhinitis. Long history of allergies. Notes an increase of nasal congestion, rhinorrhea, itchy, watery, eyes, maxillary sinus pressure, and upper teeth pain. Afebrile. No chills. Some cough, however she feels like this is secondary to her post nasal drip. No shortness of breath or wheezing. Has been taking Zyrtec, Benadryl, and Sudafed without much success. She has previously been on allergy injections for 2-3 years and states these did not help her much.     Past Medical History  Diagnosis Date  . Menopausal symptoms   . Migraines   . Recurrent sinus infections   . Depression   . Asthma   . Eczema   . Cervical dysplasia   . Allergy   . Arthritis   . GERD (gastroesophageal reflux disease)   . Neuromuscular disorder      Home Meds: Prior to Admission medications   Medication Sig Start Date End Date Taking? Authorizing Provider  cetirizine (ZYRTEC) 10 MG tablet Take 10 mg by mouth daily.   Yes Historical Provider, MD  EVAMIST 1.53 MG/SPRAY transdermal spray USE 3 SPRAYS DAILY AS DIRECTED ON INSIDE OF FOREARM 01/16/12  Yes Trellis Paganini, MD  pseudoephedrine (SUDAFED) 60 MG tablet Take 60 mg by mouth every 4 (four) hours as needed for congestion.   Yes Historical Provider, MD  venlafaxine (EFFEXOR) 75 MG tablet Take 75 mg by mouth daily.    Yes Historical Provider, MD  ALPRAZolam (XANAX) 0.25 MG tablet Take 1 tablet (0.25 mg total) by mouth 3 (three) times daily as needed. Anxiety or sleep. 12/01/12   Ok Edwards, MD                                     SUMAtriptan (IMITREX) 100 MG tablet Take 100 mg by mouth every 2 (two) hours as needed. For migraines    Historical Provider, MD    Allergies:  Allergies  Allergen  Reactions  . Avelox (Moxifloxacin Hcl In Nacl) Hives    No allergic reaction to Levaquin.    History   Social History  . Marital Status: Single    Spouse Name: N/A    Number of Children: N/A  . Years of Education: N/A   Occupational History  . Not on file.   Social History Main Topics  . Smoking status: Current Every Day Smoker -- 1.50 packs/day    Types: Cigarettes  . Smokeless tobacco: Not on file  . Alcohol Use: Yes     Comment: rare  . Drug Use: No  . Sexually Active: Yes    Birth Control/ Protection: Post-menopausal   Other Topics Concern  . Not on file   Social History Narrative  . No narrative on file     Review of Systems: Constitutional: negative for chills, fever, night sweats, weight changes, or fatigue  HEENT: see above Cardiovascular: negative for chest pain or palpitations Respiratory: negative for hemoptysis, wheezing, shortness of breath, or cough Abdominal: negative for abdominal pain, nausea, vomiting, or diarrhea Dermatological: negative for rash Neurologic: negative for headache, dizziness, or syncope   Physical Exam: Blood pressure 145/87, pulse 71, temperature 98 F (36.7 C), temperature source Oral, resp. rate 18, height 5\' 5"  (  1.651 m), weight 185 lb (83.915 kg), SpO2 100.00%., Body mass index is 30.79 kg/(m^2). General: Well developed, well nourished, in no acute distress. Head: Normocephalic, atraumatic, eyes without discharge, sclera non-icteric, nares are pale and boggy. Bilateral auditory canals clear, TM's are without perforation, pearly grey and translucent with reflective cone of light bilaterally. Oral cavity moist, posterior pharynx with post nasal drip. No exudate, erythema, or peritonsillar abscess. Uvula midline.   Neck: Supple. No thyromegaly. Full ROM. No lymphadenopathy. Lungs: Clear bilaterally to auscultation without wheezes, rales, or rhonchi. Breathing is unlabored. Heart: RRR with S1 S2. No murmurs, rubs, or gallops  appreciated. Msk:  Strength and tone normal for age. Extremities/Skin: Warm and dry. No clubbing or cyanosis. No edema. No rashes or suspicious lesions. Neuro: Alert and oriented X 3. Moves all extremities spontaneously. Gait is normal. CNII-XII grossly in tact. Psych:  Responds to questions appropriately with a normal affect.     ASSESSMENT AND PLAN:  52 y.o. female with allergic rhinitis and secondary sinus infection -Amoxicillin 500 mg 1 po tid #30 no RF -Prednisone 20 mg #18 3x3, 2x3, 1x3 no RF -Astepro 1 spray bid #1 RF 5 -Singulair 10 mg 1 po qhs #30 RF 11 -ProAir 2 puffs inhaled q 4-6 hours prn asthma #1 RF 2 -RTC prn  Signed, Eula Listen, PA-C 03/11/2013 11:51 AM

## 2013-04-01 ENCOUNTER — Other Ambulatory Visit: Payer: Self-pay | Admitting: *Deleted

## 2013-04-01 MED ORDER — ESTRADIOL 1.53 MG/SPRAY TD SOLN: 3.0000 | Freq: Every day | TRANSDERMAL | Status: DC

## 2013-04-01 NOTE — Telephone Encounter (Signed)
Pt will be called to schedule annual exam. KW 

## 2013-04-09 ENCOUNTER — Encounter: Payer: Self-pay | Admitting: Women's Health

## 2013-04-09 ENCOUNTER — Ambulatory Visit (INDEPENDENT_AMBULATORY_CARE_PROVIDER_SITE_OTHER): Payer: BC Managed Care – PPO | Admitting: Women's Health

## 2013-04-09 ENCOUNTER — Other Ambulatory Visit (HOSPITAL_COMMUNITY)
Admission: RE | Admit: 2013-04-09 | Discharge: 2013-04-09 | Disposition: A | Discharge status: Home / Self Care | Payer: BC Managed Care – PPO | Source: Ambulatory Visit | Attending: Women's Health | Admitting: Women's Health

## 2013-04-09 VITALS — BP 130/84 | Ht 65.0 in | Wt 186.0 lb

## 2013-04-09 DIAGNOSIS — Z01419 Encounter for gynecological examination (general) (routine) without abnormal findings: Secondary | ICD-10-CM | POA: Insufficient documentation

## 2013-04-09 DIAGNOSIS — G4700 Insomnia, unspecified: Secondary | ICD-10-CM

## 2013-04-09 DIAGNOSIS — Z79890 Hormone replacement therapy (postmenopausal): Secondary | ICD-10-CM

## 2013-04-09 MED ORDER — PROGESTERONE MICRONIZED 200 MG PO CAPS: ORAL_CAPSULE | ORAL | Status: DC

## 2013-04-09 MED ORDER — ALPRAZOLAM 0.25 MG PO TABS: 0.2500 mg | ORAL_TABLET | Freq: Three times a day (TID) | ORAL | Status: DC | PRN

## 2013-04-09 MED ORDER — ESTRADIOL 0.75 MG/1.25 GM (0.06%) TD GEL: 1.2500 g | Freq: Every day | TRANSDERMAL | Status: DC

## 2013-04-09 NOTE — Patient Instructions (Addendum)
Health Recommendations for Postmenopausal Women Respected and ongoing research has looked at the most common causes of death, disability, and poor quality of life in postmenopausal women. The causes include heart disease, diseases of blood vessels, diabetes, depression, cancer, and bone loss (osteoporosis). Many things can be done to help lower the chances of developing these and other common problems: CARDIOVASCULAR DISEASE Heart Disease: A heart attack is a medical emergency. Know the signs and symptoms of a heart attack. Below are things women can do to reduce their risk for heart disease.   Do not smoke. If you smoke, quit.  Aim for a healthy weight. Being overweight causes many preventable deaths. Eat a healthy and balanced diet and drink an adequate amount of liquids.  Get moving. Make a commitment to be more physically active. Aim for 30 minutes of activity on most, if not all days of the week.  Eat for heart health. Choose a diet that is low in saturated fat and cholesterol and eliminate trans fat. Include whole grains, vegetables, and fruits. Read and understand the labels on food containers before buying.  Know your numbers. Ask your caregiver to check your blood pressure, cholesterol (total, HDL, LDL, triglycerides) and blood glucose. Work with your caregiver on improving your entire clinical picture.  High blood pressure. Limit or stop your table salt intake (try salt substitute and food seasonings). Avoid salty foods and drinks. Read labels on food containers before buying. Eating well and exercising can help control high blood pressure. STROKE  Stroke is a medical emergency. Stroke may be the result of a blood clot in a blood vessel in the brain or by a brain hemorrhage (bleeding). Know the signs and symptoms of a stroke. To lower the risk of developing a stroke:  Avoid fatty foods.  Quit smoking.  Control your diabetes, blood pressure, and irregular heart rate. THROMBOPHLEBITIS  (BLOOD CLOT) OF THE LEG  Becoming overweight and leading a stationary lifestyle may also contribute to developing blood clots. Controlling your diet and exercising will help lower the risk of developing blood clots. CANCER SCREENING  Breast Cancer: Take steps to reduce your risk of breast cancer.  You should practice "breast self-awareness." This means understanding the normal appearance and feel of your breasts and should include breast self-examination. Any changes detected, no matter how small, should be reported to your caregiver.  After age 40, you should have a clinical breast exam (CBE) every year.  Starting at age 40, you should consider having a mammogram (breast X-ray) every year.  If you have a family history of breast cancer, talk to your caregiver about genetic screening.  If you are at high risk for breast cancer, talk to your caregiver about having an MRI and a mammogram every year.  Intestinal or Stomach Cancer: Tests to consider are a rectal exam, fecal occult blood, sigmoidoscopy, and colonoscopy. Women who are high risk may need to be screened at an earlier age and more often.  Cervical Cancer:  Beginning at age 30, you should have a Pap test every 3 years as long as the past 3 Pap tests have been normal.  If you have had past treatment for cervical cancer or a condition that could lead to cancer, you need Pap tests and screening for cancer for at least 20 years after your treatment.  If you had a hysterectomy for a problem that was not cancer or a condition that could lead to cancer, then you no longer need Pap tests.    If you are between ages 65 and 70, and you have had normal Pap tests going back 10 years, you no longer need Pap tests.  If Pap tests have been discontinued, risk factors (such as a new sexual partner) need to be reassessed to determine if screening should be resumed.  Some medical problems can increase the chance of getting cervical cancer. In these  cases, your caregiver may recommend more frequent screening and Pap tests.  Uterine Cancer: If you have vaginal bleeding after reaching menopause, you should notify your caregiver.  Ovarian cancer: Other than yearly pelvic exams, there are no reliable tests available to screen for ovarian cancer at this time except for yearly pelvic exams.  Lung Cancer: Yearly chest X-rays can detect lung cancer and should be done on high risk women, such as cigarette smokers and women with chronic lung disease (emphysema).  Skin Cancer: A complete body skin exam should be done at your yearly examination. Avoid overexposure to the sun and ultraviolet light lamps. Use a strong sun block cream when in the sun. All of these things are important in lowering the risk of skin cancer. MENOPAUSE Menopause Symptoms: Hormone therapy products are effective for treating symptoms associated with menopause:  Moderate to severe hot flashes.  Night sweats.  Mood swings.  Headaches.  Tiredness.  Loss of sex drive.  Insomnia.  Other symptoms. Hormone replacement carries certain risks, especially in older women. Women who use or are thinking about using estrogen or estrogen with progestin treatments should discuss that with their caregiver. Your caregiver will help you understand the benefits and risks. The ideal dose of hormone replacement therapy is not known. The Food and Drug Administration (FDA) has concluded that hormone therapy should be used only at the lowest doses and for the shortest amount of time to reach treatment goals.  OSTEOPOROSIS Protecting Against Bone Loss and Preventing Fracture: If you use hormone therapy for prevention of bone loss (osteoporosis), the risks for bone loss must outweigh the risk of the therapy. Ask your caregiver about other medications known to be safe and effective for preventing bone loss and fractures. To guard against bone loss or fractures, the following is recommended:  If  you are less than age 50, take 1000 mg of calcium and at least 600 mg of Vitamin D per day.  If you are greater than age 50 but less than age 70, take 1200 mg of calcium and at least 600 mg of Vitamin D per day.  If you are greater than age 70, take 1200 mg of calcium and at least 800 mg of Vitamin D per day. Smoking and excessive alcohol intake increases the risk of osteoporosis. Eat foods rich in calcium and vitamin D and do weight bearing exercises several times a week as your caregiver suggests. DIABETES Diabetes Melitus: If you have Type I or Type 2 diabetes, you should keep your blood sugar under control with diet, exercise and recommended medication. Avoid too many sweets, starchy and fatty foods. Being overweight can make control more difficult. COGNITION AND MEMORY Cognition and Memory: Menopausal hormone therapy is not recommended for the prevention of cognitive disorders such as Alzheimer's disease or memory loss.  DEPRESSION  Depression may occur at any age, but is common in elderly women. The reasons may be because of physical, medical, social (loneliness), or financial problems and needs. If you are experiencing depression because of medical problems and control of symptoms, talk to your caregiver about this. Physical activity and   exercise may help with mood and sleep. Community and volunteer involvement may help your sense of value and worth. If you have depression and you feel that the problem is getting worse or becoming severe, talk to your caregiver about treatment options that are best for you. ACCIDENTS  Accidents are common and can be serious in the elderly woman. Prepare your house to prevent accidents. Eliminate throw rugs, place hand bars in the bath, shower and toilet areas. Avoid wearing high heeled shoes or walking on wet, snowy, and icy areas. Limit or stop driving if you have vision or hearing problems, or you feel you are unsteady with you movements and  reflexes. HEPATITIS C Hepatitis C is a type of viral infection affecting the liver. It is spread mainly through contact with blood from an infected person. It can be treated, but if left untreated, it can lead to severe liver damage over years. Many people who are infected do not know that the virus is in their blood. If you are a "baby-boomer", it is recommended that you have one screening test for Hepatitis C. IMMUNIZATIONS  Several immunizations are important to consider having during your senior years, including:   Tetanus, diptheria, and pertussis booster shot.  Influenza every year before the flu season begins.  Pneumonia vaccine.  Shingles vaccine.  Others as indicated based on your specific needs. Talk to your caregiver about these. Document Released: 12/27/2005 Document Revised: 10/21/2012 Document Reviewed: 08/22/2008 Exit[employer] Patient Information 2014 [employer].  

## 2013-04-09 NOTE — Addendum Note (Signed)
Addended by: Dayna Barker on: 04/09/2013 01:01 PM   Modules accepted: Orders

## 2013-04-09 NOTE — Progress Notes (Signed)
Katie Chang 02-28-1961 [id number]    History:    The patient presents for annual exam.  Postmenopausal on Evamist and has used no progestin in the past year, prescribed but forgot to take. 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 Problems with recurrent sinus infections, anxiety/depression primary care manages. 2013 DEXA osteopenia T score -1.2 of left femoral neck, bilateral hip average -0.3 FRAX 4%/0.4%. Normal mammogram history.   Past medical history, past surgical history, family history and social history were all reviewed and documented in the EPIC chart. Currently unemployed, IT consultant and accounting. Engaged, has horses.  ROS:  A  ROS was performed and pertinent positives and negatives are included in the history.  Exam:  Filed Vitals:   04/09/13 1150  BP: 130/84    General appearance:  Normal Head/Neck:  Normal, without cervical or supraclavicular adenopathy. Thyroid:  Symmetrical, normal in size, without palpable masses or nodularity. Respiratory  Effort:  Normal  Auscultation:  Clear without wheezing or rhonchi Cardiovascular  Auscultation:  Regular rate, without rubs, murmurs or gallops  Edema/varicosities:  Not grossly evident Abdominal  Soft,nontender, without masses, guarding or rebound.  Liver/spleen:  No organomegaly noted  Hernia:  None appreciated  Skin  Inspection:  Grossly normal  Palpation:  Grossly normal Neurologic/psychiatric  Orientation:  Normal with appropriate conversation.  Mood/affect:  Normal  Genitourinary    Breasts: Examined lying and sitting.     Right: Without masses, retractions, discharge or axillary adenopathy.     Left: Without masses, retractions, discharge or axillary adenopathy.   Inguinal/mons:  Normal without inguinal adenopathy  External genitalia:  Normal  BUS/Urethra/Skene's glands:  Normal  Bladder:  Normal  Vagina:  Normal  Cervix:  Normal  Uterus:  normal in size, shape and contour.  Midline and  mobile  Adnexa/parametria:     Rt: Without masses or tenderness.   Lt: Without masses or tenderness.  Anus and perineum: Normal  Digital rectal exam: Normal sphincter tone without palpated masses or tenderness  Assessment/Plan:  51 y.o. EWF G1P0 for annual exam.     CIN-1 2006 normal Paps after Postmenopausal on unopposed estrogen Anxiety/depression/sinus issues-primary care labs and meds Osteopenia T score -1.2 left femoral neck  Plan: Reviewed importance of taking her progestin when on estrogen. Prometrium 200 HS day one through 12 of each month, will try estrogel 2 pumps daily, sample, coupon given, (Evamist $100 per month.) ultrasound to check endometrial thickness, endometrial biopsy if needed. SBE's, instructed to schedule mammogram, last mammogram normal 2012. Continue exercise as able, chronic neck pain. Home safety and fall prevention discussed, calcium rich foods, vitamin D 2000 daily encouraged. Colonoscopy, will schedule through primary care. Pap, UA[facility]rington [facility]all[facility], 12:48 PM 04/09/2013

## 2013-04-10 LAB — URINALYSIS W MICROSCOPIC + REFLEX CULTURE
Bilirubin Urine: NEGATIVE
Casts: NONE SEEN
Glucose, UA: NEGATIVE mg/dL
Hgb urine dipstick: NEGATIVE
Leukocytes, UA: NEGATIVE
pH: 7 (ref 5.0–8.0)

## 2013-04-13 ENCOUNTER — Other Ambulatory Visit: Payer: Self-pay | Admitting: *Deleted

## 2013-04-13 DIAGNOSIS — G4700 Insomnia, unspecified: Secondary | ICD-10-CM

## 2013-04-13 MED ORDER — ALPRAZOLAM 0.25 MG PO TABS: 0.2500 mg | ORAL_TABLET | Freq: Three times a day (TID) | ORAL | Status: DC | PRN

## 2013-04-13 NOTE — Telephone Encounter (Signed)
Pt needed Xanax Rx called in kw

## 2013-04-23 ENCOUNTER — Other Ambulatory Visit: Payer: BC Managed Care – PPO

## 2013-04-23 ENCOUNTER — Ambulatory Visit: Payer: BC Managed Care – PPO | Admitting: Women's Health

## 2013-04-28 ENCOUNTER — Other Ambulatory Visit: Payer: BC Managed Care – PPO

## 2013-04-28 ENCOUNTER — Ambulatory Visit: Payer: BC Managed Care – PPO | Admitting: Women's Health

## 2013-05-22 ENCOUNTER — Emergency Department (HOSPITAL_COMMUNITY)
Admission: EM | Admit: 2013-05-22 | Discharge: 2013-05-22 | Disposition: A | Discharge status: Home / Self Care | Payer: BC Managed Care – PPO | Attending: Emergency Medicine | Admitting: Emergency Medicine

## 2013-05-22 DIAGNOSIS — M545 Low back pain, unspecified: Secondary | ICD-10-CM | POA: Insufficient documentation

## 2013-05-22 DIAGNOSIS — Z79899 Other long term (current) drug therapy: Secondary | ICD-10-CM | POA: Insufficient documentation

## 2013-05-22 DIAGNOSIS — F17200 Nicotine dependence, unspecified, uncomplicated: Secondary | ICD-10-CM | POA: Insufficient documentation

## 2013-05-22 DIAGNOSIS — IMO0002 Reserved for concepts with insufficient information to code with codable children: Secondary | ICD-10-CM | POA: Insufficient documentation

## 2013-05-22 DIAGNOSIS — F3289 Other specified depressive episodes: Secondary | ICD-10-CM | POA: Insufficient documentation

## 2013-05-22 DIAGNOSIS — Z8709 Personal history of other diseases of the respiratory system: Secondary | ICD-10-CM | POA: Insufficient documentation

## 2013-05-22 DIAGNOSIS — Z8739 Personal history of other diseases of the musculoskeletal system and connective tissue: Secondary | ICD-10-CM | POA: Insufficient documentation

## 2013-05-22 DIAGNOSIS — Z87898 Personal history of other specified conditions: Secondary | ICD-10-CM | POA: Insufficient documentation

## 2013-05-22 DIAGNOSIS — Z8679 Personal history of other diseases of the circulatory system: Secondary | ICD-10-CM | POA: Insufficient documentation

## 2013-05-22 DIAGNOSIS — M79609 Pain in unspecified limb: Secondary | ICD-10-CM | POA: Insufficient documentation

## 2013-05-22 DIAGNOSIS — F329 Major depressive disorder, single episode, unspecified: Secondary | ICD-10-CM | POA: Insufficient documentation

## 2013-05-22 DIAGNOSIS — Z8719 Personal history of other diseases of the digestive system: Secondary | ICD-10-CM | POA: Insufficient documentation

## 2013-05-22 DIAGNOSIS — Z8742 Personal history of other diseases of the female genital tract: Secondary | ICD-10-CM | POA: Insufficient documentation

## 2013-05-22 DIAGNOSIS — J45909 Unspecified asthma, uncomplicated: Secondary | ICD-10-CM | POA: Insufficient documentation

## 2013-05-22 DIAGNOSIS — Z872 Personal history of diseases of the skin and subcutaneous tissue: Secondary | ICD-10-CM | POA: Insufficient documentation

## 2013-05-22 MED ORDER — HYDROMORPHONE HCL PF 1 MG/ML IJ SOLN
1.0000 mg | Freq: Once | INTRAMUSCULAR | Status: AC
  Administered 2013-05-22: 1 mg via INTRAMUSCULAR
  Filled 2013-05-22: qty 1

## 2013-05-22 MED ORDER — ONDANSETRON 4 MG PO TBDP
4.0000 mg | ORAL_TABLET | Freq: Once | ORAL | Status: AC
  Administered 2013-05-22: 4 mg via ORAL
  Filled 2013-05-22: qty 1

## 2013-05-22 NOTE — ED Provider Notes (Signed)
History    This chart was scribed for non-physician practitioner Marlon Pel PA-C working with Juliet Rude. Rubin Payor, MD by Ashley Jacobs, ED scribe. This patient was seen in room WTR6/WTR6 and the patient's care was started at 8:08 PM  CSN: [id number] Arrival date & time 05/22/13  4098    Chief Complaint  Patient presents with  . Back Pain    The history is provided by the patient and medical records. No language interpreter was used.   HPI Comments: Katie Chang is a 52 y.o. female who presents to the Emergency Department complaining of constant moderate lower back pain that started three weeks ago and has gradually worsened.  Pt is also having mild pain in left leg. Pt has visited ED previously and has no relief. Pt denies constipation, loss of bowel or bladder, shooting pain, trouble walking, or burning in her lower back. Pt has taken medications like aleve, ibuprofen, icy hot and percocet with no relief. Pt reports that nothing relieves or worsens pain. Her Orthopedist is Dr. Farris Has and she plans to see him first thing on Monday morning.  Past Medical History  Diagnosis Date  . Menopausal symptoms   . Migraines   . Recurrent sinus infections   . Depression   . Asthma   . Eczema   . Cervical dysplasia   . Allergy   . Arthritis   . GERD (gastroesophageal reflux disease)   . DDD (degenerative disc disease), cervical     Cervical and Lumbar   Past Surgical History  Procedure Laterality Date  . Tonsillectomy  1990  . Nasal sinus surgery  1992  . Colposcopy     Family History  Problem Relation Age of Onset  . Heart disease Mother   . Hypertension Father   . Breast cancer Maternal Aunt     Age 39's  . Diabetes Paternal Grandfather   . Heart disease Paternal Grandfather   . Stroke Maternal Grandmother   . Stroke Maternal Grandfather    History  Substance Use Topics  . Smoking status: Current Every Day Smoker -- 1.50 packs/day    Types: Cigarettes  . Smokeless  tobacco: Not on file  . Alcohol Use: Yes     Comment: rare   OB History   Grav Para Term Preterm Abortions TAB SAB Ect Mult Living   1    1     0     Review of Systems  Musculoskeletal: Positive for back pain.       Pain in left side of lower back Pain in left back  All other systems reviewed and are negative.    Allergies  Avelox  Home Medications   Current Outpatient Rx  Name  Route  Sig  Dispense  Refill  . albuterol (PROAIR HFA) 108 (90 BASE) MCG/ACT inhaler   Inhalation   Inhale 2 puffs into the lungs every 6 (six) hours as needed for wheezing.   1 Inhaler   2   . ALPRAZolam (XANAX) 0.25 MG tablet   Oral   Take 1 tablet (0.25 mg total) by mouth 3 (three) times daily as needed. Anxiety or sleep.   30 tablet   2   . azelastine (ASTELIN) 137 MCG/SPRAY nasal spray   Nasal   Place 1 spray into the nose 2 (two) times daily. Use in each nostril as directed   30 mL   5   . cetirizine (ZYRTEC) 10 MG tablet   Oral   Take 10  mg by mouth daily.         . Estradiol (ESTROGEL) 0.75 MG/1.25 GM (0.06%) topical gel   Transdermal   Place 1.25 g onto the skin daily.   1 Bottle   12   . HYDROcodone-acetaminophen (VICODIN) 5-500 MG per tablet   Oral   Take 2 tablets by mouth every 6 (six) hours as needed for pain.         . montelukast (SINGULAIR) 10 MG tablet   Oral   Take 1 tablet (10 mg total) by mouth at bedtime.   30 tablet   11   . naproxen sodium (ANAPROX) 220 MG tablet   Oral   Take 220 mg by mouth 2 (two) times daily with a meal.         . ondansetron (ZOFRAN) 4 MG tablet   Oral   Take 1 tablet (4 mg total) by mouth every 6 (six) hours.   12 tablet   0   . oxyCODONE-acetaminophen (PERCOCET) 10-325 MG per tablet   Oral   Take 1 tablet by mouth every 4 (four) hours as needed for pain.         . predniSONE (DELTASONE) 20 MG tablet   Oral   Take 20 mg by mouth daily.         . progesterone (PROMETRIUM) 200 MG capsule      Take day  1-12   36 capsule   4   . pseudoephedrine (SUDAFED) 60 MG tablet   Oral   Take 60 mg by mouth every 4 (four) hours as needed for congestion.         . SUMAtriptan (IMITREX) 100 MG tablet   Oral   Take 100 mg by mouth every 2 (two) hours as needed. For migraines         . venlafaxine (EFFEXOR) 75 MG tablet   Oral   Take 75 mg by mouth daily. 112.5mg           BP 147/87  Pulse 88  Temp(Src) 98.3 F (36.8 C) (Oral)  Resp 16  SpO2 97% Physical Exam  Nursing note and vitals reviewed. Constitutional: She is oriented to person, place, and time. She appears well-developed and well-nourished. No distress.  HENT:  Head: Normocephalic and atraumatic.  Eyes: EOM are normal.  Neck: Neck supple. No tracheal deviation present.  Cardiovascular: Normal rate.   Pulmonary/Chest: Effort normal. No respiratory distress.  Musculoskeletal: Normal range of motion.       Back:    Equal strength to bilateral lower extremities. Neurosensory function adequate to both legs. Skin color is normal. Skin is warm and moist. I see no step off deformity, no bony tenderness. Pt is able to ambulate without limp. Pain is relieved when sitting in certain positions. ROM is decreased due to pain. No crepitus, laceration, effusion, swelling.  Pulses are normal   Neurological: She is alert and oriented to person, place, and time.  Skin: Skin is warm and dry.  Psychiatric: She has a normal mood and affect. Her behavior is normal.    ED Course  Procedures (including critical care time) DIAGNOSTIC STUDIES: Oxygen Saturation is 97% on room air, normal by my interpretation.    COORDINATION OF CARE: 8:12 PM Discussed course of care with pt which includes pain medicine and Zofran . Pt understands and agrees.   Labs Reviewed - No data to display No results found. 1. Low back pain     MDM  Patient with back pain.  No neurological deficits. Patient is ambulatory. No warning symptoms of back pain including:  loss of bowel or bladder control, night sweats, waking from sleep with back pain, unexplained fevers or weight loss, h/o cancer, IVDU, recent significant  trauma. No concern for cauda equina, epidural abscess, or other serious cause of back pain. Conservative measures such as rest, ice/heat and pain medicine indicated with PCP follow-up if no improvement with conservative management.    Patient given 2mg  IM Dilaudid for pain and it significantly helped patients pain. She will see her orthopedist first thing on Monday for follow-up.  I personally performed the services described in this documentation, which was scribed in my presence. The recorded information has been reviewed and is accurate.    Dorthula Matas, PA-C 05/22/13 2127

## 2013-05-22 NOTE — ED Notes (Signed)
Pt states she has a ride home. 

## 2013-05-22 NOTE — ED Provider Notes (Signed)
Medical screening examination/treatment/procedure(s) were performed by non-physician practitioner and as supervising physician I was immediately available for consultation/collaboration.  Alfretta Pinch R. Ismerai Bin, MD 05/22/13 2153 

## 2013-05-22 NOTE — ED Notes (Signed)
Pt c/o lower back pain which radiates to L side. Pt states she has had this pain for the past 3 weeks. Pt states she has a hx of herniated discs in the lower lumbar region. Pt went to [facility] today and was given Prednisone and Vicodin. Pt has taken these and has also taken Percocet today, yet states that her pain persists. Pt states she drove herself here. Pt ambulatory to exam room with steady gait.

## 2013-07-08 ENCOUNTER — Ambulatory Visit (INDEPENDENT_AMBULATORY_CARE_PROVIDER_SITE_OTHER): Payer: BC Managed Care – PPO | Admitting: Family Medicine

## 2013-07-08 VITALS — BP 160/94 | HR 78 | Temp 99.5°F | Resp 16 | Ht 65.0 in | Wt 184.0 lb

## 2013-07-08 DIAGNOSIS — J0100 Acute maxillary sinusitis, unspecified: Secondary | ICD-10-CM

## 2013-07-08 DIAGNOSIS — J309 Allergic rhinitis, unspecified: Secondary | ICD-10-CM

## 2013-07-08 DIAGNOSIS — J9801 Acute bronchospasm: Secondary | ICD-10-CM

## 2013-07-08 MED ORDER — AMOXICILLIN-POT CLAVULANATE 875-125 MG PO TABS: 1.0000 | ORAL_TABLET | Freq: Two times a day (BID) | ORAL | Status: DC

## 2013-07-08 MED ORDER — PREDNISONE 20 MG PO TABS: ORAL_TABLET | ORAL | Status: DC

## 2013-07-08 MED ORDER — HYDROCOD POLST-CHLORPHEN POLST 10-8 MG/5ML PO LQCR: 5.0000 mL | Freq: Two times a day (BID) | ORAL | Status: DC | PRN

## 2013-07-08 NOTE — Progress Notes (Signed)
[address]   [place], [place]  [address]   [phone]  Subjective:    Patient ID: Katie Chang, female    DOB: May 09, 1961, 52 y.o.   MRN: [id number]  HPI This 52 y.o. female presents for evaluation of URI, sinuses, coughing.  No fever but +sweats; +chills.  Onset one weeks ago.  +HA; +sinus pressure/pain.  +ST; ear fullness but no pain. +rhinorrhea; +nasal congestion; +PND; clear.  +coughing moderate; no sputum production; chest tightness; +wheezing; no Albuterol use; no Astelin use.  No v/d.  No sneezing.  Taking Zyrtec, Singulair, sudafed 60mg  this morning.  +smoker.  Hates to use Albuterol; tastes bad.  Has not seen Imbery Callas in a while.   Review of Systems  Constitutional: Negative for chills, diaphoresis and fatigue.  HENT: Positive for congestion, sore throat, rhinorrhea, voice change, postnasal drip and sinus pressure. Negative for ear pain, sneezing, trouble swallowing and ear discharge.   Respiratory: Positive for cough and wheezing. Negative for shortness of breath and stridor.   Gastrointestinal: Negative for nausea, vomiting and diarrhea.  Neurological: Positive for headaches. Negative for dizziness.   Past Medical History  Diagnosis Date  . Menopausal symptoms   . Migraines   . Recurrent sinus infections   . Depression   . Asthma   . Eczema   . Cervical dysplasia   . Allergy   . Arthritis   . GERD (gastroesophageal reflux disease)   . DDD (degenerative disc disease), cervical     Cervical and Lumbar   Past Surgical History  Procedure Laterality Date  . Tonsillectomy  1990  . Nasal sinus surgery  1992  . Colposcopy     Allergies  Allergen Reactions  . Avelox [Moxifloxacin Hcl In Nacl] Hives    No allergic reaction to Levaquin.   Current Outpatient Prescriptions on File Prior to Visit  Medication Sig Dispense Refill  . albuterol (PROAIR HFA) 108 (90 BASE) MCG/ACT inhaler Inhale 2 puffs into the lungs every 6 (six) hours as needed for wheezing.  1 Inhaler  2    . ALPRAZolam (XANAX) 0.25 MG tablet Take 1 tablet (0.25 mg total) by mouth 3 (three) times daily as needed. Anxiety or sleep.  30 tablet  2  . cetirizine (ZYRTEC) 10 MG tablet Take 10 mg by mouth daily.      . Estradiol (ESTROGEL) 0.75 MG/1.25 GM (0.06%) topical gel Place 1.25 g onto the skin daily.  1 Bottle  12  . montelukast (SINGULAIR) 10 MG tablet Take 1 tablet (10 mg total) by mouth at bedtime.  30 tablet  11  . naproxen sodium (ANAPROX) 220 MG tablet Take 220 mg by mouth 2 (two) times daily with a meal.      . ondansetron (ZOFRAN) 4 MG tablet Take 1 tablet (4 mg total) by mouth every 6 (six) hours.  12 tablet  0  . oxyCODONE-acetaminophen (PERCOCET) 10-325 MG per tablet Take 1 tablet by mouth every 4 (four) hours as needed for pain.      . progesterone (PROMETRIUM) 200 MG capsule Take day 1-12  36 capsule  4  . pseudoephedrine (SUDAFED) 60 MG tablet Take 60 mg by mouth every 4 (four) hours as needed for congestion.      . SUMAtriptan (IMITREX) 100 MG tablet Take 100 mg by mouth every 2 (two) hours as needed. For migraines      . venlafaxine (EFFEXOR) 75 MG tablet Take 75 mg by mouth daily. 112.5mg       .  azelastine (ASTELIN) 137 MCG/SPRAY nasal spray Place 1 spray into the nose 2 (two) times daily. Use in each nostril as directed  30 mL  5  . HYDROcodone-acetaminophen (VICODIN) 5-500 MG per tablet Take 2 tablets by mouth every 6 (six) hours as needed for pain.       No current facility-administered medications on file prior to visit.       Objective:   Physical Exam  Nursing note and vitals reviewed. Constitutional: She is oriented to person, place, and time. She appears well-developed and well-nourished. No distress.  HENT:  Head: Normocephalic and atraumatic.  Right Ear: External ear normal.  Left Ear: External ear normal.  Nose: Mucosal edema and rhinorrhea present. Right sinus exhibits maxillary sinus tenderness and frontal sinus tenderness. Left sinus exhibits maxillary sinus  tenderness and frontal sinus tenderness.  Mouth/Throat: Mucous membranes are normal. Oropharyngeal exudate present. No posterior oropharyngeal edema, posterior oropharyngeal erythema or tonsillar abscesses.  Eyes: Conjunctivae and EOM are normal. Pupils are equal, round, and reactive to light.  Neck: Normal range of motion. Neck supple.  Cardiovascular: Normal rate, regular rhythm and normal heart sounds.  Exam reveals no gallop and no friction rub.   No murmur heard. Pulmonary/Chest: No respiratory distress. She has no decreased breath sounds. She has wheezes. She has rhonchi in the right upper field, the right middle field, the right lower field, the left upper field, the left middle field and the left lower field. She has no rales.  Lymphadenopathy:    She has no cervical adenopathy.  Neurological: She is alert and oriented to person, place, and time.  Skin: Skin is warm and dry. No rash noted. She is not diaphoretic.  Psychiatric: She has a normal mood and affect. Her behavior is normal.   PEAK FLOWS: 325M 270, 350; EXPECTED PEAK FLOW:  450.     Assessment & Plan:  Acute maxillary sinusitis  Bronchospasm  Allergic rhinitis, cause unspecified   1. Acute Maxillary Sinusitis:  New. Rx for Augmentin, Prednisone. Restart Astelin nasal spray. 2.  Bronchospasm:  New.  S/p Albuterol HFA in office; rx for Prednisone provided; recommend Albuterol tid scheduled for five days and then PRN.  Rx for Tussionex provided. 3.  Allergic Rhinitis:  Worsening/uncontrolled; continue Zyrtec, Singulair; restart Astelin.  Meds ordered this encounter  Medications  . amoxicillin-clavulanate (AUGMENTIN) 875-125 MG per tablet    Sig: Take 1 tablet by mouth 2 (two) times daily.    Dispense:  20 tablet    Refill:  0  . predniSONE (DELTASONE) 20 MG tablet    Sig: Three tablets daily x 1 day, then two tablets daily x 5 days, then one tablet daily x 5 days    Dispense:  18 tablet    Refill:  0  .  chlorpheniramine-HYDROcodone (TUSSIONEX PENNKINETIC ER) 10-8 MG/5ML LQCR    Sig: Take 5 mLs by mouth every 12 (twelve) hours as needed.    Dispense:  180 mL    Refill:  0

## 2013-07-12 ENCOUNTER — Other Ambulatory Visit: Payer: Self-pay

## 2013-07-12 DIAGNOSIS — G4700 Insomnia, unspecified: Secondary | ICD-10-CM

## 2013-07-12 MED ORDER — ALPRAZOLAM 0.25 MG PO TABS: 0.2500 mg | ORAL_TABLET | Freq: Three times a day (TID) | ORAL | Status: DC | PRN

## 2013-07-12 NOTE — Telephone Encounter (Signed)
CALLED INTO PHARMACY

## 2013-07-20 ENCOUNTER — Telehealth: Payer: Self-pay

## 2013-07-20 NOTE — Telephone Encounter (Signed)
PT STATES SHE HAVE FINISHED ALL HER ANTIBIOTICS AND STILL HAVE A HEADACHE, THINKS SHE NEED SOMETHING ELSE STRONGER PLEASE CALL [phone]    [place]

## 2013-07-20 NOTE — Telephone Encounter (Signed)
Dr. Katrinka Blazing,  I was going to advise this patient to RTC, unless you have other advice.

## 2013-07-21 NOTE — Telephone Encounter (Signed)
Agree; advise patient to return to clinic if symptoms persistent.

## 2013-07-21 NOTE — Telephone Encounter (Signed)
Thanks. Patient advised.  

## 2013-08-23 ENCOUNTER — Encounter (HOSPITAL_COMMUNITY): Payer: Self-pay | Admitting: Emergency Medicine

## 2013-08-23 ENCOUNTER — Emergency Department (HOSPITAL_COMMUNITY)
Admission: EM | Admit: 2013-08-23 | Discharge: 2013-08-23 | Disposition: A | Discharge status: Home / Self Care | Payer: BC Managed Care – PPO | Attending: Emergency Medicine | Admitting: Emergency Medicine

## 2013-08-23 DIAGNOSIS — J45909 Unspecified asthma, uncomplicated: Secondary | ICD-10-CM | POA: Insufficient documentation

## 2013-08-23 DIAGNOSIS — Z8719 Personal history of other diseases of the digestive system: Secondary | ICD-10-CM | POA: Insufficient documentation

## 2013-08-23 DIAGNOSIS — Z872 Personal history of diseases of the skin and subcutaneous tissue: Secondary | ICD-10-CM | POA: Insufficient documentation

## 2013-08-23 DIAGNOSIS — R109 Unspecified abdominal pain: Secondary | ICD-10-CM | POA: Insufficient documentation

## 2013-08-23 DIAGNOSIS — Z79899 Other long term (current) drug therapy: Secondary | ICD-10-CM | POA: Insufficient documentation

## 2013-08-23 DIAGNOSIS — Z8742 Personal history of other diseases of the female genital tract: Secondary | ICD-10-CM | POA: Insufficient documentation

## 2013-08-23 DIAGNOSIS — R112 Nausea with vomiting, unspecified: Secondary | ICD-10-CM | POA: Insufficient documentation

## 2013-08-23 DIAGNOSIS — Z8679 Personal history of other diseases of the circulatory system: Secondary | ICD-10-CM | POA: Insufficient documentation

## 2013-08-23 DIAGNOSIS — Z8739 Personal history of other diseases of the musculoskeletal system and connective tissue: Secondary | ICD-10-CM | POA: Insufficient documentation

## 2013-08-23 DIAGNOSIS — F3289 Other specified depressive episodes: Secondary | ICD-10-CM | POA: Insufficient documentation

## 2013-08-23 DIAGNOSIS — F329 Major depressive disorder, single episode, unspecified: Secondary | ICD-10-CM | POA: Insufficient documentation

## 2013-08-23 DIAGNOSIS — R61 Generalized hyperhidrosis: Secondary | ICD-10-CM | POA: Insufficient documentation

## 2013-08-23 DIAGNOSIS — F17200 Nicotine dependence, unspecified, uncomplicated: Secondary | ICD-10-CM | POA: Insufficient documentation

## 2013-08-23 DIAGNOSIS — M129 Arthropathy, unspecified: Secondary | ICD-10-CM | POA: Insufficient documentation

## 2013-08-23 LAB — COMPREHENSIVE METABOLIC PANEL
AST: 19 U/L (ref 0–37)
Albumin: 4.1 g/dL (ref 3.5–5.2)
Alkaline Phosphatase: 67 U/L (ref 39–117)
BUN: 9 mg/dL (ref 6–23)
Chloride: 104 mEq/L (ref 96–112)
Potassium: 3.4 mEq/L — ABNORMAL LOW (ref 3.5–5.1)
Sodium: 139 mEq/L (ref 135–145)
Total Protein: 7.1 g/dL (ref 6.0–8.3)

## 2013-08-23 LAB — CBC WITH DIFFERENTIAL/PLATELET
Basophils Absolute: 0 10*3/uL (ref 0.0–0.1)
Basophils Relative: 0 % (ref 0–1)
Eosinophils Absolute: 0.1 10*3/uL (ref 0.0–0.7)
MCH: 29.8 pg (ref 26.0–34.0)
MCHC: 34.9 g/dL (ref 30.0–36.0)
Monocytes Relative: 3 % (ref 3–12)
Neutro Abs: 8.3 10*3/uL — ABNORMAL HIGH (ref 1.7–7.7)
Neutrophils Relative %: 76 % (ref 43–77)
Platelets: 373 10*3/uL (ref 150–400)
RDW: 14 % (ref 11.5–15.5)

## 2013-08-23 LAB — LIPASE, BLOOD: Lipase: 15 U/L (ref 11–59)

## 2013-08-23 MED ORDER — ONDANSETRON 4 MG PO TBDP: 4.0000 mg | ORAL_TABLET | Freq: Three times a day (TID) | ORAL | Status: DC | PRN

## 2013-08-23 MED ORDER — MORPHINE SULFATE 4 MG/ML IJ SOLN
4.0000 mg | Freq: Once | INTRAMUSCULAR | Status: AC
  Administered 2013-08-23: 4 mg via INTRAVENOUS
  Filled 2013-08-23: qty 1

## 2013-08-23 MED ORDER — HYDROMORPHONE HCL PF 1 MG/ML IJ SOLN
1.0000 mg | Freq: Once | INTRAMUSCULAR | Status: AC
  Administered 2013-08-23: 1 mg via INTRAVENOUS
  Filled 2013-08-23: qty 1

## 2013-08-23 MED ORDER — LORAZEPAM 2 MG/ML IJ SOLN
1.0000 mg | Freq: Once | INTRAMUSCULAR | Status: AC
  Administered 2013-08-23: 1 mg via INTRAVENOUS
  Filled 2013-08-23: qty 1

## 2013-08-23 MED ORDER — ONDANSETRON HCL 4 MG/2ML IJ SOLN
4.0000 mg | Freq: Once | INTRAMUSCULAR | Status: AC
  Administered 2013-08-23: 4 mg via INTRAVENOUS
  Filled 2013-08-23: qty 2

## 2013-08-23 MED ORDER — SODIUM CHLORIDE 0.9 % IV BOLUS (SEPSIS)
1000.0000 mL | Freq: Once | INTRAVENOUS | Status: AC
  Administered 2013-08-23: 1000 mL via INTRAVENOUS

## 2013-08-23 NOTE — ED Provider Notes (Signed)
CSN: [id number]     Arrival date & time 08/23/13  7829 History   First MD Initiated Contact with Patient 08/23/13 [phone]     Chief Complaint  Patient presents with  . Emesis   (Consider location/radiation/quality/duration/timing/severity/associated sxs/prior Treatment) HPI Comments: Patient is a 52 year old female with a past medical history of cyclical vomiting syndrome who presents with abdominal pain with associated nausea and vomiting that started a few hours ago. The pain is located in her generalized abdomen and does not radiate. The pain is described as cramping and severe. The pain started gradually and progressively worsened since the onset. No alleviating/aggravating factors. The patient has tried nothing for symptoms without relief. Associated symptoms include nausea and vomiting. Patient denies fever, headache, diarrhea, chest pain, SOB, dysuria, constipation, abnormal vaginal bleeding/discharge. Patient reports this feels like her typical cyclic vomiting flare up.      Past Medical History  Diagnosis Date  . Menopausal symptoms   . Migraines   . Recurrent sinus infections   . Depression   . Asthma   . Eczema   . Cervical dysplasia   . Allergy   . Arthritis   . GERD (gastroesophageal reflux disease)   . DDD (degenerative disc disease), cervical     Cervical and Lumbar   Past Surgical History  Procedure Laterality Date  . Tonsillectomy  1990  . Nasal sinus surgery  1992  . Colposcopy     Family History  Problem Relation Age of Onset  . Heart disease Mother   . Hypertension Father   . Breast cancer Maternal Aunt     Age 50's  . Diabetes Paternal Grandfather   . Heart disease Paternal Grandfather   . Stroke Maternal Grandmother   . Stroke Maternal Grandfather    History  Substance Use Topics  . Smoking status: Current Every Day Smoker -- 1.50 packs/day for 10 years    Types: Cigarettes  . Smokeless tobacco: Not on file  . Alcohol Use: No     Comment: rare    OB History   Grav Para Term Preterm Abortions TAB SAB Ect Mult Living   1    1     0     Review of Systems  Gastrointestinal: Positive for nausea, vomiting and abdominal pain.  All other systems reviewed and are negative.    Allergies  Avelox  Home Medications   Current Outpatient Rx  Name  Route  Sig  Dispense  Refill  . albuterol (PROAIR HFA) 108 (90 BASE) MCG/ACT inhaler   Inhalation   Inhale 2 puffs into the lungs every 6 (six) hours as needed for wheezing.   1 Inhaler   2   . ALPRAZolam (XANAX) 0.25 MG tablet   Oral   Take 0.25 mg by mouth 3 (three) times daily as needed for sleep or anxiety.         . cetirizine (ZYRTEC) 10 MG tablet   Oral   Take 10 mg by mouth every morning.          . Estradiol (ESTROGEL) 0.75 MG/1.25 GM (0.06%) topical gel   Transdermal   Place 1.25 g onto the skin daily.   1 Bottle   12   . HYDROcodone-acetaminophen (VICODIN) 5-500 MG per tablet   Oral   Take 2 tablets by mouth every 6 (six) hours as needed for pain.         . montelukast (SINGULAIR) 10 MG tablet   Oral   Take  1 tablet (10 mg total) by mouth at bedtime.   30 tablet   11   . naproxen sodium (ANAPROX) 220 MG tablet   Oral   Take 220 mg by mouth 2 (two) times daily as needed (pain).          Marland Kitchen venlafaxine (EFFEXOR) 75 MG tablet   Oral   Take 112.5 mg by mouth every morning. 112.5mg           BP 166/95  Pulse 72  Temp(Src) 97.9 F (36.6 C) (Oral)  Ht 5\' 5"  (1.651 m)  Wt 180 lb (81.647 kg)  BMI 29.95 kg/m2  SpO2 100% Physical Exam  Nursing note and vitals reviewed. Constitutional: She is oriented to person, place, and time. She appears well-developed and well-nourished. No distress.  Patient is uncomfortable and dry-heaving  HENT:  Head: Normocephalic and atraumatic.  Eyes: Conjunctivae and EOM are normal.  Neck: Normal range of motion.  Cardiovascular: Normal rate and regular rhythm.  Exam reveals no gallop and no friction rub.   No murmur  heard. Pulmonary/Chest: Effort normal and breath sounds normal. She has no wheezes. She has no rales. She exhibits no tenderness.  Abdominal: Soft. She exhibits no distension. There is tenderness. There is no rebound and no guarding.  Generalized abdominal tenderness to palpation. No focal tenderness to palpation or peritoneal signs.   Musculoskeletal: Normal range of motion.  Neurological: She is alert and oriented to person, place, and time. Coordination normal.  Speech is goal-oriented. Moves limbs without ataxia.   Skin: Skin is warm. She is diaphoretic.  Psychiatric: She has a normal mood and affect. Her behavior is normal.    ED Course  Procedures (including critical care time) Labs Review Labs Reviewed  CBC WITH DIFFERENTIAL - Abnormal; Notable for the following:    WBC 10.9 (*)    Neutro Abs 8.3 (*)    All other components within normal limits  COMPREHENSIVE METABOLIC PANEL - Abnormal; Notable for the following:    Potassium 3.4 (*)    Glucose, Bld 150 (*)    All other components within normal limits  LIPASE, BLOOD   Imaging Review No results found.  MDM   1. Nausea and vomiting     6:05 AM Labs and urinalysis pending. Vitals stable and patient afebrile. Patient will have IV fluids, morphine and zofran.   9:21 AM Patient feeling better after receiving IV fluids along with pain and nausea medication. Labs unremarkable for acute changes. Patient reports this feels like her cyclic nausea and vomiting. Vitals stable. I am not concerned for any acute infectious process at this time. Patient instructed to follow up with PCP as needed and return to the ED with worsening or concerning symptoms.    Emilia Beck, [place] 08/23/13 [phone]

## 2013-08-23 NOTE — ED Provider Notes (Signed)
Medical screening examination/treatment/procedure(s) were performed by non-physician practitioner and as supervising physician I was immediately available for consultation/collaboration.   Rolan Bucco, MD 08/23/13 [phone]

## 2013-08-23 NOTE — ED Notes (Signed)
Per pt: started having nausea and vomiting around 0200, and abdominal pain.

## 2013-08-23 NOTE — ED Notes (Signed)
Patient is resting, will get urine when she wakes up.

## 2013-08-24 ENCOUNTER — Other Ambulatory Visit: Payer: Self-pay

## 2013-08-24 DIAGNOSIS — Z79890 Hormone replacement therapy (postmenopausal): Secondary | ICD-10-CM

## 2013-08-24 MED ORDER — ALPRAZOLAM 0.25 MG PO TABS: 0.2500 mg | ORAL_TABLET | Freq: Three times a day (TID) | ORAL | Status: DC | PRN

## 2013-08-24 NOTE — Telephone Encounter (Signed)
Rx called in to pharmacy. 

## 2013-08-24 NOTE — Telephone Encounter (Signed)
Patient was in the ER yesterday for nausea and vomiting. I think she was prescribed Zofran tell her not to take together with Xanax. Okay to E. Scribe Xanax. Office visit if needed.

## 2013-08-24 NOTE — Telephone Encounter (Signed)
Left message for pt to call me

## 2013-08-24 NOTE — Telephone Encounter (Signed)
I spoke with patient and advised her not to take the Xanax while taking the Zofran.

## 2013-08-25 ENCOUNTER — Other Ambulatory Visit: Payer: BC Managed Care – PPO

## 2013-08-25 ENCOUNTER — Ambulatory Visit: Payer: BC Managed Care – PPO | Admitting: Women's Health

## 2013-11-02 ENCOUNTER — Other Ambulatory Visit: Payer: Self-pay

## 2013-11-02 MED ORDER — ALPRAZOLAM 0.25 MG PO TABS: 0.2500 mg | ORAL_TABLET | Freq: Three times a day (TID) | ORAL | Status: DC | PRN

## 2013-11-03 NOTE — Telephone Encounter (Signed)
Called into pharmacy

## 2013-12-30 ENCOUNTER — Other Ambulatory Visit: Payer: Self-pay

## 2013-12-30 MED ORDER — ALPRAZOLAM 0.25 MG PO TABS: 0.2500 mg | ORAL_TABLET | Freq: Three times a day (TID) | ORAL | Status: DC | PRN

## 2013-12-30 NOTE — Telephone Encounter (Signed)
Called into pharmacy

## 2014-01-12 ENCOUNTER — Encounter (HOSPITAL_COMMUNITY): Payer: Self-pay | Admitting: Emergency Medicine

## 2014-01-12 ENCOUNTER — Emergency Department (HOSPITAL_COMMUNITY)
Admission: EM | Admit: 2014-01-12 | Discharge: 2014-01-12 | Disposition: A | Discharge status: Home / Self Care | Payer: BC Managed Care – PPO | Attending: Emergency Medicine | Admitting: Emergency Medicine

## 2014-01-12 ENCOUNTER — Emergency Department (HOSPITAL_COMMUNITY)
Admission: EM | Admit: 2014-01-12 | Discharge: 2014-01-13 | Disposition: A | Discharge status: Home / Self Care | Payer: BC Managed Care – PPO | Attending: Emergency Medicine | Admitting: Emergency Medicine

## 2014-01-12 DIAGNOSIS — F17200 Nicotine dependence, unspecified, uncomplicated: Secondary | ICD-10-CM | POA: Insufficient documentation

## 2014-01-12 DIAGNOSIS — Z8719 Personal history of other diseases of the digestive system: Secondary | ICD-10-CM | POA: Insufficient documentation

## 2014-01-12 DIAGNOSIS — R1013 Epigastric pain: Secondary | ICD-10-CM | POA: Insufficient documentation

## 2014-01-12 DIAGNOSIS — Z3202 Encounter for pregnancy test, result negative: Secondary | ICD-10-CM | POA: Insufficient documentation

## 2014-01-12 DIAGNOSIS — Z79899 Other long term (current) drug therapy: Secondary | ICD-10-CM | POA: Insufficient documentation

## 2014-01-12 DIAGNOSIS — R112 Nausea with vomiting, unspecified: Secondary | ICD-10-CM | POA: Insufficient documentation

## 2014-01-12 DIAGNOSIS — F329 Major depressive disorder, single episode, unspecified: Secondary | ICD-10-CM | POA: Insufficient documentation

## 2014-01-12 DIAGNOSIS — Z872 Personal history of diseases of the skin and subcutaneous tissue: Secondary | ICD-10-CM | POA: Insufficient documentation

## 2014-01-12 DIAGNOSIS — F3289 Other specified depressive episodes: Secondary | ICD-10-CM | POA: Insufficient documentation

## 2014-01-12 DIAGNOSIS — Z8679 Personal history of other diseases of the circulatory system: Secondary | ICD-10-CM | POA: Insufficient documentation

## 2014-01-12 DIAGNOSIS — Z8742 Personal history of other diseases of the female genital tract: Secondary | ICD-10-CM | POA: Insufficient documentation

## 2014-01-12 DIAGNOSIS — R1110 Vomiting, unspecified: Secondary | ICD-10-CM

## 2014-01-12 DIAGNOSIS — Z8709 Personal history of other diseases of the respiratory system: Secondary | ICD-10-CM | POA: Insufficient documentation

## 2014-01-12 DIAGNOSIS — J45909 Unspecified asthma, uncomplicated: Secondary | ICD-10-CM | POA: Insufficient documentation

## 2014-01-12 DIAGNOSIS — M129 Arthropathy, unspecified: Secondary | ICD-10-CM | POA: Insufficient documentation

## 2014-01-12 DIAGNOSIS — R109 Unspecified abdominal pain: Secondary | ICD-10-CM

## 2014-01-12 LAB — CBC WITH DIFFERENTIAL/PLATELET
BASOS ABS: 0 10*3/uL (ref 0.0–0.1)
BASOS PCT: 0 % (ref 0–1)
Basophils Absolute: 0 10*3/uL (ref 0.0–0.1)
Basophils Relative: 0 % (ref 0–1)
EOS ABS: 0.1 10*3/uL (ref 0.0–0.7)
EOS PCT: 0 % (ref 0–5)
Eosinophils Absolute: 0 10*3/uL (ref 0.0–0.7)
Eosinophils Relative: 1 % (ref 0–5)
HCT: 42.4 % (ref 36.0–46.0)
HEMATOCRIT: 38.2 % (ref 36.0–46.0)
HEMOGLOBIN: 13.4 g/dL (ref 12.0–15.0)
Hemoglobin: 14.8 g/dL (ref 12.0–15.0)
LYMPHS ABS: 1.8 10*3/uL (ref 0.7–4.0)
LYMPHS ABS: 1.2 10*3/uL (ref 0.7–4.0)
Lymphocytes Relative: 14 % (ref 12–46)
Lymphocytes Relative: 15 % (ref 12–46)
MCH: 29.3 pg (ref 26.0–34.0)
MCH: 29.1 pg (ref 26.0–34.0)
MCHC: 34.9 g/dL (ref 30.0–36.0)
MCHC: 35.1 g/dL (ref 30.0–36.0)
MCV: 84 fL (ref 78.0–100.0)
MCV: 82.9 fL (ref 78.0–100.0)
MONO ABS: 0.5 10*3/uL (ref 0.1–1.0)
MONOS PCT: 7 % (ref 3–12)
Monocytes Absolute: 0.7 10*3/uL (ref 0.1–1.0)
Monocytes Relative: 5 % (ref 3–12)
NEUTROS PCT: 80 % — AB (ref 43–77)
Neutro Abs: 10.2 10*3/uL — ABNORMAL HIGH (ref 1.7–7.7)
Neutro Abs: 6.1 10*3/uL (ref 1.7–7.7)
Neutrophils Relative %: 78 % — ABNORMAL HIGH (ref 43–77)
PLATELETS: 364 10*3/uL (ref 150–400)
Platelets: 306 10*3/uL (ref 150–400)
RBC: 5.05 MIL/uL (ref 3.87–5.11)
RBC: 4.61 MIL/uL (ref 3.87–5.11)
RDW: 14.2 % (ref 11.5–15.5)
RDW: 14 % (ref 11.5–15.5)
WBC: 12.8 10*3/uL — AB (ref 4.0–10.5)
WBC: 7.9 10*3/uL (ref 4.0–10.5)

## 2014-01-12 LAB — COMPREHENSIVE METABOLIC PANEL
ALBUMIN: 3.8 g/dL (ref 3.5–5.2)
ALK PHOS: 65 U/L (ref 39–117)
ALT: 12 U/L (ref 0–35)
ALT: 13 U/L (ref 0–35)
AST: 11 U/L (ref 0–37)
AST: 15 U/L (ref 0–37)
Albumin: 4.1 g/dL (ref 3.5–5.2)
Alkaline Phosphatase: 62 U/L (ref 39–117)
BUN: 9 mg/dL (ref 6–23)
BUN: 7 mg/dL (ref 6–23)
CALCIUM: 9.3 mg/dL (ref 8.4–10.5)
CALCIUM: 9.4 mg/dL (ref 8.4–10.5)
CO2: 23 mEq/L (ref 19–32)
CO2: 20 meq/L (ref 19–32)
CREATININE: 0.71 mg/dL (ref 0.50–1.10)
Chloride: 101 mEq/L (ref 96–112)
Chloride: 94 mEq/L — ABNORMAL LOW (ref 96–112)
Creatinine, Ser: 0.65 mg/dL (ref 0.50–1.10)
GFR calc Af Amer: >90 mL/min (ref >90)
GFR calc non Af Amer: >90 mL/min (ref >90)
GLUCOSE: 114 mg/dL — AB (ref 70–99)
Glucose, Bld: 114 mg/dL — ABNORMAL HIGH (ref 70–99)
Potassium: 3.3 mEq/L — ABNORMAL LOW (ref 3.7–5.3)
Potassium: 3.2 mEq/L — ABNORMAL LOW (ref 3.7–5.3)
SODIUM: 139 meq/L (ref 137–147)
Sodium: 134 mEq/L — ABNORMAL LOW (ref 137–147)
TOTAL PROTEIN: 6.7 g/dL (ref 6.0–8.3)
Total Bilirubin: 0.8 mg/dL (ref 0.3–1.2)
Total Bilirubin: 0.9 mg/dL (ref 0.3–1.2)
Total Protein: 7.1 g/dL (ref 6.0–8.3)

## 2014-01-12 LAB — URINALYSIS, ROUTINE W REFLEX MICROSCOPIC
Bilirubin Urine: NEGATIVE
GLUCOSE, UA: NEGATIVE mg/dL
Hgb urine dipstick: NEGATIVE
Ketones, ur: 40 mg/dL — AB
LEUKOCYTES UA: NEGATIVE
NITRITE: NEGATIVE
PROTEIN: NEGATIVE mg/dL
Specific Gravity, Urine: 1.016 (ref 1.005–1.030)
UROBILINOGEN UA: 0.2 mg/dL (ref 0.0–1.0)
pH: 8.5 — ABNORMAL HIGH (ref 5.0–8.0)

## 2014-01-12 LAB — POC URINE PREG, ED: PREG TEST UR: NEGATIVE

## 2014-01-12 LAB — LIPASE, BLOOD: Lipase: 12 U/L (ref 11–59)

## 2014-01-12 MED ORDER — SODIUM CHLORIDE 0.9 % IV BOLUS (SEPSIS)
1000.0000 mL | Freq: Once | INTRAVENOUS | Status: AC
  Administered 2014-01-13: 1000 mL via INTRAVENOUS

## 2014-01-12 MED ORDER — HYDROMORPHONE HCL PF 1 MG/ML IJ SOLN
1.0000 mg | Freq: Once | INTRAMUSCULAR | Status: AC
  Administered 2014-01-12: 1 mg via INTRAVENOUS
  Filled 2014-01-12: qty 1

## 2014-01-12 MED ORDER — FENTANYL CITRATE 0.05 MG/ML IJ SOLN
50.0000 ug | Freq: Once | INTRAMUSCULAR | Status: AC
  Administered 2014-01-12: 50 ug via INTRAVENOUS
  Filled 2014-01-12: qty 2

## 2014-01-12 MED ORDER — HYDROMORPHONE HCL PF 2 MG/ML IJ SOLN
2.0000 mg | Freq: Once | INTRAMUSCULAR | Status: AC
  Administered 2014-01-12: 2 mg via INTRAVENOUS
  Filled 2014-01-12: qty 1

## 2014-01-12 MED ORDER — HYDROMORPHONE HCL PF 1 MG/ML IJ SOLN
1.0000 mg | Freq: Once | INTRAMUSCULAR | Status: DC
  Filled 2014-01-12: qty 1

## 2014-01-12 MED ORDER — SODIUM CHLORIDE 0.9 % IV BOLUS (SEPSIS)
1000.0000 mL | Freq: Once | INTRAVENOUS | Status: AC
  Administered 2014-01-12: 1000 mL via INTRAVENOUS

## 2014-01-12 MED ORDER — FENTANYL CITRATE 0.05 MG/ML IJ SOLN: 100.0000 ug | Freq: Once | INTRAMUSCULAR | Status: DC

## 2014-01-12 MED ORDER — ONDANSETRON HCL 4 MG/2ML IJ SOLN
4.0000 mg | Freq: Once | INTRAMUSCULAR | Status: AC
  Administered 2014-01-12: 4 mg via INTRAVENOUS
  Filled 2014-01-12: qty 2

## 2014-01-12 NOTE — ED Notes (Addendum)
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 Pt not actively vomiting at the moment just dry heaving. Pt also c/o upper abd pain.

## 2014-01-12 NOTE — ED Provider Notes (Signed)
CSN: [id number]     Arrival date & time 01/12/14  1902 History   First MD Initiated Contact with Patient 01/12/14 2214     Chief Complaint  Patient presents with  . Abdominal Pain  . Nausea  . Emesis     (Consider location/radiation/quality/duration/timing/severity/associated sxs/prior Treatment) HPI Comments: Patient is a 53 year old female past medical history significant for cyclic vomiting syndrome, migraines, depression, asthma, eczema, and arthritis, GERD, DDD presented to the emergency department for recurrent nausea, emesis, epigastric abdominal pain since being discharged from the emergency department earlier this afternoon. Patient states she has had " a million" more episodes of emesis. She states her pain is "way more severe" now than it was before. When asking patient which is provided with at home for nausea she states "my primary care doctor will not give me Dilaudid for this." Patient denies any abdominal surgical history.  Patient is a 53 y.o. female presenting with abdominal pain and vomiting.  Abdominal Pain Associated symptoms: nausea and vomiting   Associated symptoms: no chest pain, no chills, no diarrhea, no fever and no shortness of breath   Emesis Associated symptoms: abdominal pain   Associated symptoms: no chills and no diarrhea     Past Medical History  Diagnosis Date  . Menopausal symptoms   . Migraines   . Recurrent sinus infections   . Depression   . Asthma   . Eczema   . Cervical dysplasia   . Allergy   . Arthritis   . GERD (gastroesophageal reflux disease)   . DDD (degenerative disc disease), cervical     Cervical and Lumbar   Past Surgical History  Procedure Laterality Date  . Tonsillectomy  1990  . Nasal sinus surgery  1992  . Colposcopy     Family History  Problem Relation Age of Onset  . Heart disease Mother   . Hypertension Father   . Breast cancer Maternal Aunt     Age 53's  . Diabetes Paternal Grandfather   . Heart disease  Paternal Grandfather   . Stroke Maternal Grandmother   . Stroke Maternal Grandfather    History  Substance Use Topics  . Smoking status: Current Every Day Smoker -- 1.50 packs/day for 10 years    Types: Cigarettes  . Smokeless tobacco: Not on file  . Alcohol Use: No     Comment: rare   OB History   Grav Para Term Preterm Abortions TAB SAB Ect Mult Living   1    1     0     Review of Systems  Constitutional: Negative for fever and chills.  Respiratory: Negative for shortness of breath.   Cardiovascular: Negative for chest pain.  Gastrointestinal: Positive for nausea, vomiting and abdominal pain. Negative for diarrhea.  All other systems reviewed and are negative.      Allergies  Avelox  Home Medications   Current Outpatient Rx  Name  Route  Sig  Dispense  Refill  . albuterol (PROAIR HFA) 108 (90 BASE) MCG/ACT inhaler   Inhalation   Inhale 2 puffs into the lungs every 6 (six) hours as needed for wheezing.   1 Inhaler   2   . ALPRAZolam (XANAX) 0.25 MG tablet   Oral   Take 0.25 mg by mouth at bedtime as needed for anxiety or sleep.         . cetirizine (ZYRTEC) 10 MG tablet   Oral   Take 10 mg by mouth every morning.          .Marland Kitchen  Estradiol (ESTROGEL) 0.75 MG/1.25 GM (0.06%) topical gel   Transdermal   Place 1.25 g onto the skin daily.   1 Bottle   12   . HYDROcodone-acetaminophen (VICODIN) 5-500 MG per tablet   Oral   Take 2 tablets by mouth every 6 (six) hours as needed for pain.         . montelukast (SINGULAIR) 10 MG tablet   Oral   Take 1 tablet (10 mg total) by mouth at bedtime.   30 tablet   11   . venlafaxine (EFFEXOR) 75 MG tablet   Oral   Take 112.5 mg by mouth every morning.           BP 165/97  Pulse 64  Temp(Src) 98.6 F (37 C) (Oral)  Resp 22  Ht 5\' 7"  (1.702 m)  Wt 180 lb (81.647 kg)  BMI 28.19 kg/m2  SpO2 100% Physical Exam  Constitutional: She is oriented to person, place, and time. She appears well-developed and  well-nourished. No distress.  The patient rolling around in bed dry heaving  HENT:  Head: Normocephalic and atraumatic.  Right Ear: External ear normal.  Left Ear: External ear normal.  Nose: Nose normal.  Mouth/Throat: Uvula is midline, oropharynx is clear and moist and mucous membranes are normal. Mucous membranes are not pale, not dry and not cyanotic. No oropharyngeal exudate.  Eyes: Conjunctivae are normal.  Neck: Normal range of motion. Neck supple.  Cardiovascular: Normal rate, regular rhythm and normal heart sounds.   Pulmonary/Chest: Effort normal and breath sounds normal. No respiratory distress.  Abdominal: Soft. Bowel sounds are normal. She exhibits no distension. There is no tenderness. There is no rebound and no guarding.  Musculoskeletal: Normal range of motion.  Neurological: She is alert and oriented to person, place, and time.  Skin: Skin is warm and dry. She is not diaphoretic.    ED Course  Procedures (including critical care time) Medications  sodium chloride 0.9 % bolus 1,000 mL (not administered)  HYDROmorphone (DILAUDID) injection 2 mg (not administered)  ondansetron (ZOFRAN) injection 4 mg (not administered)  sodium chloride 0.9 % bolus 1,000 mL (not administered)  fentaNYL (SUBLIMAZE) injection 50 mcg (50 mcg Intravenous Given 01/12/14 2010)    Labs Review Labs Reviewed  CBC WITH DIFFERENTIAL - Abnormal; Notable for the following:    Neutrophils Relative % 78 (*)    All other components within normal limits  COMPREHENSIVE METABOLIC PANEL - Abnormal; Notable for the following:    Sodium 134 (*)    Potassium 3.2 (*)    Chloride 94 (*)    Glucose, Bld 114 (*)    All other components within normal limits  POC URINE PREG, ED   Imaging Review No results found.  EKG Interpretation   None       MDM   Final diagnoses:  None    Filed Vitals:   01/12/14 2007  BP: 165/97  Pulse: 64  Temp: 98.6 F (37 C)  Resp: 22   10:35 PM on initial  evaluation the patient she is rolling around on the bed, dry heaving. Abdominal exam is completely benign. No peritoneal signs. Several empty emesis times noted. Mucous membranes moist. Plan at this time is to hydrate patient and insure the anion gap is closing.   Second bolus running, will let this finish prior to repeat BMP, will sign out to [employer], PA-C.   Patient d/w with Dr. Micheline Maze, agrees with plan.    Lise Auer Majesta Leichter, PA-C  01/13/14 0057 

## 2014-01-12 NOTE — ED Notes (Signed)
Pt presents with NAD. Per [facility]. [facility]. Here for the same. IV placed by EMS Zofran 4mg  given in route- Pt continues to dry heave without vomiting

## 2014-01-12 NOTE — ED Provider Notes (Signed)
CSN: [id number]     Arrival date & time 01/12/14  [id number] History   First MD Initiated Contact with Patient 01/12/14 [phone]     Chief Complaint  Patient presents with  . Nausea  . Vomiting  . Abdominal Pain     (Consider location/radiation/quality/duration/timing/severity/associated sxs/prior Treatment) Patient is a 53 y.o. female presenting with vomiting.  Emesis Severity:  Severe Duration:  5 hours Timing:  Constant Quality:  Stomach contents Progression:  Unchanged Chronicity:  Recurrent Relieved by:  Nothing Worsened by:  Nothing tried Ineffective treatments:  None tried Associated symptoms: abdominal pain   Associated symptoms: no diarrhea and no fever   Abdominal pain:    Location:  Epigastric   Quality:  Sharp   Severity:  Severe   Onset quality:  Gradual   Duration:  5 hours   Timing:  Constant   Progression:  Unchanged   Chronicity:  Recurrent   Past Medical History  Diagnosis Date  . Menopausal symptoms   . Migraines   . Recurrent sinus infections   . Depression   . Asthma   . Eczema   . Cervical dysplasia   . Allergy   . Arthritis   . GERD (gastroesophageal reflux disease)   . DDD (degenerative disc disease), cervical     Cervical and Lumbar   Past Surgical History  Procedure Laterality Date  . Tonsillectomy  1990  . Nasal sinus surgery  1992  . Colposcopy     Family History  Problem Relation Age of Onset  . Heart disease Mother   . Hypertension Father   . Breast cancer Maternal Aunt     Age 53's  . Diabetes Paternal Grandfather   . Heart disease Paternal Grandfather   . Stroke Maternal Grandmother   . Stroke Maternal Grandfather    History  Substance Use Topics  . Smoking status: Current Every Day Smoker -- 1.50 packs/day for 10 years    Types: Cigarettes  . Smokeless tobacco: Not on file  . Alcohol Use: No     Comment: rare   OB History   Grav Para Term Preterm Abortions TAB SAB Ect Mult Living   1    1     0     Review of  Systems  Constitutional: Negative for fever.  HENT: Negative for congestion.   Respiratory: Negative for cough and shortness of breath.   Cardiovascular: Negative for chest pain.  Gastrointestinal: Positive for nausea, vomiting and abdominal pain. Negative for diarrhea and constipation.  All other systems reviewed and are negative.      Allergies  Avelox  Home Medications   Current Outpatient Rx  Name  Route  Sig  Dispense  Refill  . albuterol (PROAIR HFA) 108 (90 BASE) MCG/ACT inhaler   Inhalation   Inhale 2 puffs into the lungs every 6 (six) hours as needed for wheezing.   1 Inhaler   2   . ALPRAZolam (XANAX) 0.25 MG tablet   Oral   Take 1 tablet (0.25 mg total) by mouth 3 (three) times daily as needed for sleep or anxiety.   30 tablet   1   . cetirizine (ZYRTEC) 10 MG tablet   Oral   Take 10 mg by mouth every morning.          . Estradiol (ESTROGEL) 0.75 MG/1.25 GM (0.06%) topical gel   Transdermal   Place 1.25 g onto the skin daily.   1 Bottle   12   . HYDROcodone-acetaminophen (  VICODIN) 5-500 MG per tablet   Oral   Take 2 tablets by mouth every 6 (six) hours as needed for pain.         . montelukast (SINGULAIR) 10 MG tablet   Oral   Take 1 tablet (10 mg total) by mouth at bedtime.   30 tablet   11   . naproxen sodium (ANAPROX) 220 MG tablet   Oral   Take 220 mg by mouth 2 (two) times daily as needed (pain).          . ondansetron (ZOFRAN ODT) 4 MG disintegrating tablet   Oral   Take 1 tablet (4 mg total) by mouth every 8 (eight) hours as needed for nausea.   10 tablet   0   . venlafaxine (EFFEXOR) 75 MG tablet   Oral   Take 112.5 mg by mouth every morning. 112.5mg           BP 153/73  Pulse 79  Temp(Src) 97.9 F (36.6 C) (Oral)  Resp 20  SpO2 100% Physical Exam  Nursing note and vitals reviewed. Constitutional: She is oriented to person, place, and time. She appears well-developed and well-nourished. No distress.  HENT:  Head:  Normocephalic and atraumatic.  Mouth/Throat: Oropharynx is clear and moist.  Eyes: Conjunctivae are normal. Pupils are equal, round, and reactive to light. No scleral icterus.  Neck: Neck supple.  Cardiovascular: Normal rate, regular rhythm, normal heart sounds and intact distal pulses.   No murmur heard. Pulmonary/Chest: Effort normal and breath sounds normal. No stridor. No respiratory distress. She has no rales.  Abdominal: Soft. Bowel sounds are normal. She exhibits no distension. There is tenderness in the epigastric area. There is no rigidity, no rebound and no guarding.  Musculoskeletal: Normal range of motion.  Neurological: She is alert and oriented to person, place, and time.  Skin: Skin is warm and dry. No rash noted.  Psychiatric: She has a normal mood and affect. Her behavior is normal.    ED Course  Procedures (including critical care time) Labs Review Labs Reviewed  CBC WITH DIFFERENTIAL - Abnormal; Notable for the following:    WBC 12.8 (*)    Neutrophils Relative % 80 (*)    Neutro Abs 10.2 (*)    All other components within normal limits  URINALYSIS, ROUTINE W REFLEX MICROSCOPIC - Abnormal; Notable for the following:    pH 8.5 (*)    Ketones, ur 40 (*)    All other components within normal limits  COMPREHENSIVE METABOLIC PANEL - Abnormal; Notable for the following:    Potassium 3.3 (*)    Glucose, Bld 114 (*)    All other components within normal limits  LIPASE, BLOOD   Imaging Review No results found.  EKG Interpretation   None       MDM   Final diagnoses:  Vomiting  Abdominal pain    53 yo female who states she has cyclic vomiting syndrome presenting with vomiting and abdominal pain which she describes as similar to prior episodes.  Appears somewhat histrionic on exam with frequent dry heaving episodes. Tenderness located to epigastrium, no RUQ or lower abdominal tenderness.  Plan IV symptom control and screening labwork.    Pain and nausea  resolved after 4mg  Zofran and 2mg  Dilaudid IV.  Labs unremarkable. Repeat abdominal exam with soft and nontender abdomen.  Plan DC home, with PCP follow up.    Candyce Churn III, MD 01/12/14 [phone]

## 2014-01-12 NOTE — ED Notes (Signed)
Pt states she is unable to provide urine specimen due to pain.

## 2014-01-12 NOTE — Discharge Instructions (Signed)
Abdominal Pain, Adult °Many things can cause abdominal pain. Usually, abdominal pain is not caused by a disease and will improve without treatment. It can often be observed and treated at home. Your health care provider will do a physical exam and possibly order blood tests and X-rays to help determine the seriousness of your pain. However, in many cases, more time must pass before a clear cause of the pain can be found. Before that point, your health care provider may not know if you need more testing or further treatment. °HOME CARE INSTRUCTIONS  °Monitor your abdominal pain for any changes. The following actions may help to alleviate any discomfort you are experiencing: °· Only take over-the-counter or prescription medicines as directed by your health care provider. °· Do not take laxatives unless directed to do so by your health care provider. °· Try a clear liquid diet (broth, tea, or water) as directed by your health care provider. Slowly move to a bland diet as tolerated. °SEEK MEDICAL CARE IF: °· You have unexplained abdominal pain. °· You have abdominal pain associated with nausea or diarrhea. °· You have pain when you urinate or have a bowel movement. °· You experience abdominal pain that wakes you in the night. °· You have abdominal pain that is worsened or improved by eating food. °· You have abdominal pain that is worsened with eating fatty foods. °SEEK IMMEDIATE MEDICAL CARE IF:  °· Your pain does not go away within 2 hours. °· You have a fever. °· You keep throwing up (vomiting). °· Your pain is felt only in portions of the abdomen, such as the right side or the left lower portion of the abdomen. °· You pass bloody or black tarry stools. °MAKE SURE YOU: °· Understand these instructions.   °· Will watch your condition.   °· Will get help right away if you are not doing well or get worse.   °Document Released: 08/14/2005 Document Revised: 08/25/2013 Document Reviewed: 07/14/2013 °[employer]® Patient  Information ©2014 [employer]. ° °

## 2014-01-13 ENCOUNTER — Emergency Department (HOSPITAL_COMMUNITY): Payer: BC Managed Care – PPO

## 2014-01-13 ENCOUNTER — Emergency Department (HOSPITAL_COMMUNITY)
Admission: EM | Admit: 2014-01-13 | Discharge: 2014-01-13 | Disposition: A | Discharge status: Home / Self Care | Payer: BC Managed Care – PPO | Attending: Emergency Medicine | Admitting: Emergency Medicine

## 2014-01-13 ENCOUNTER — Encounter (HOSPITAL_COMMUNITY): Payer: Self-pay | Admitting: Emergency Medicine

## 2014-01-13 DIAGNOSIS — M129 Arthropathy, unspecified: Secondary | ICD-10-CM | POA: Insufficient documentation

## 2014-01-13 DIAGNOSIS — Z872 Personal history of diseases of the skin and subcutaneous tissue: Secondary | ICD-10-CM | POA: Insufficient documentation

## 2014-01-13 DIAGNOSIS — Z8719 Personal history of other diseases of the digestive system: Secondary | ICD-10-CM | POA: Insufficient documentation

## 2014-01-13 DIAGNOSIS — R109 Unspecified abdominal pain: Secondary | ICD-10-CM

## 2014-01-13 DIAGNOSIS — F17200 Nicotine dependence, unspecified, uncomplicated: Secondary | ICD-10-CM | POA: Insufficient documentation

## 2014-01-13 DIAGNOSIS — F329 Major depressive disorder, single episode, unspecified: Secondary | ICD-10-CM | POA: Insufficient documentation

## 2014-01-13 DIAGNOSIS — Z8742 Personal history of other diseases of the female genital tract: Secondary | ICD-10-CM | POA: Insufficient documentation

## 2014-01-13 DIAGNOSIS — Z8679 Personal history of other diseases of the circulatory system: Secondary | ICD-10-CM | POA: Insufficient documentation

## 2014-01-13 DIAGNOSIS — F3289 Other specified depressive episodes: Secondary | ICD-10-CM | POA: Insufficient documentation

## 2014-01-13 DIAGNOSIS — Z79899 Other long term (current) drug therapy: Secondary | ICD-10-CM | POA: Insufficient documentation

## 2014-01-13 DIAGNOSIS — J45909 Unspecified asthma, uncomplicated: Secondary | ICD-10-CM | POA: Insufficient documentation

## 2014-01-13 DIAGNOSIS — R112 Nausea with vomiting, unspecified: Secondary | ICD-10-CM | POA: Insufficient documentation

## 2014-01-13 DIAGNOSIS — R1013 Epigastric pain: Secondary | ICD-10-CM | POA: Insufficient documentation

## 2014-01-13 LAB — COMPREHENSIVE METABOLIC PANEL
ALBUMIN: 3.9 g/dL (ref 3.5–5.2)
ALT: 14 U/L (ref 0–35)
AST: 18 U/L (ref 0–37)
Alkaline Phosphatase: 62 U/L (ref 39–117)
BUN: 6 mg/dL (ref 6–23)
CHLORIDE: 98 meq/L (ref 96–112)
CO2: 18 mEq/L — ABNORMAL LOW (ref 19–32)
Calcium: 9.4 mg/dL (ref 8.4–10.5)
Creatinine, Ser: 0.63 mg/dL (ref 0.50–1.10)
GFR calc Af Amer: >90 mL/min (ref >90)
GFR calc non Af Amer: >90 mL/min (ref >90)
Glucose, Bld: 113 mg/dL — ABNORMAL HIGH (ref 70–99)
POTASSIUM: 3.4 meq/L — AB (ref 3.7–5.3)
SODIUM: 136 meq/L — AB (ref 137–147)
TOTAL PROTEIN: 7 g/dL (ref 6.0–8.3)
Total Bilirubin: 0.5 mg/dL (ref 0.3–1.2)

## 2014-01-13 LAB — CBC WITH DIFFERENTIAL/PLATELET
BASOS ABS: 0 10*3/uL (ref 0.0–0.1)
BASOS PCT: 0 % (ref 0–1)
EOS ABS: 0.1 10*3/uL (ref 0.0–0.7)
Eosinophils Relative: 1 % (ref 0–5)
HCT: 39.2 % (ref 36.0–46.0)
HEMOGLOBIN: 13.9 g/dL (ref 12.0–15.0)
Lymphocytes Relative: 21 % (ref 12–46)
Lymphs Abs: 2.4 10*3/uL (ref 0.7–4.0)
MCH: 29.8 pg (ref 26.0–34.0)
MCHC: 35.5 g/dL (ref 30.0–36.0)
MCV: 84.1 fL (ref 78.0–100.0)
MONOS PCT: 8 % (ref 3–12)
Monocytes Absolute: 0.9 10*3/uL (ref 0.1–1.0)
NEUTROS PCT: 71 % (ref 43–77)
Neutro Abs: 8.3 10*3/uL — ABNORMAL HIGH (ref 1.7–7.7)
Platelets: 319 10*3/uL (ref 150–400)
RBC: 4.66 MIL/uL (ref 3.87–5.11)
RDW: 14.7 % (ref 11.5–15.5)
WBC: 11.7 10*3/uL — ABNORMAL HIGH (ref 4.0–10.5)

## 2014-01-13 LAB — URINALYSIS, ROUTINE W REFLEX MICROSCOPIC
BILIRUBIN URINE: NEGATIVE
Glucose, UA: NEGATIVE mg/dL
Hgb urine dipstick: NEGATIVE
Ketones, ur: >80 mg/dL — AB
Leukocytes, UA: NEGATIVE
NITRITE: NEGATIVE
PROTEIN: NEGATIVE mg/dL
SPECIFIC GRAVITY, URINE: 1.016 (ref 1.005–1.030)
UROBILINOGEN UA: 0.2 mg/dL (ref 0.0–1.0)
pH: 8.5 — ABNORMAL HIGH (ref 5.0–8.0)

## 2014-01-13 LAB — BASIC METABOLIC PANEL
BUN: 6 mg/dL (ref 6–23)
CO2: 18 mEq/L — ABNORMAL LOW (ref 19–32)
Calcium: 8 mg/dL — ABNORMAL LOW (ref 8.4–10.5)
Chloride: 101 mEq/L (ref 96–112)
Creatinine, Ser: 0.56 mg/dL (ref 0.50–1.10)
GLUCOSE: 121 mg/dL — AB (ref 70–99)
POTASSIUM: 3.1 meq/L — AB (ref 3.7–5.3)
Sodium: 135 mEq/L — ABNORMAL LOW (ref 137–147)

## 2014-01-13 LAB — LIPASE, BLOOD: LIPASE: 11 U/L (ref 11–59)

## 2014-01-13 MED ORDER — MORPHINE SULFATE 4 MG/ML IJ SOLN
4.0000 mg | Freq: Once | INTRAMUSCULAR | Status: AC
  Administered 2014-01-13: 4 mg via INTRAVENOUS
  Filled 2014-01-13: qty 1

## 2014-01-13 MED ORDER — PANTOPRAZOLE SODIUM 40 MG IV SOLR
40.0000 mg | Freq: Once | INTRAVENOUS | Status: AC
  Administered 2014-01-13: 40 mg via INTRAVENOUS
  Filled 2014-01-13: qty 40

## 2014-01-13 MED ORDER — ONDANSETRON HCL 4 MG PO TABS: 4.0000 mg | ORAL_TABLET | Freq: Four times a day (QID) | ORAL | Status: DC

## 2014-01-13 MED ORDER — OXYCODONE-ACETAMINOPHEN 5-325 MG PO TABS: 2.0000 | ORAL_TABLET | ORAL | Status: DC | PRN

## 2014-01-13 MED ORDER — HYDROMORPHONE HCL PF 1 MG/ML IJ SOLN
1.0000 mg | Freq: Once | INTRAMUSCULAR | Status: AC
  Administered 2014-01-13: 1 mg via INTRAVENOUS
  Filled 2014-01-13: qty 1

## 2014-01-13 MED ORDER — PROMETHAZINE HCL 25 MG/ML IJ SOLN
25.0000 mg | Freq: Once | INTRAMUSCULAR | Status: AC
  Administered 2014-01-13: 25 mg via INTRAVENOUS
  Filled 2014-01-13: qty 1

## 2014-01-13 MED ORDER — ONDANSETRON HCL 4 MG/2ML IJ SOLN
4.0000 mg | Freq: Once | INTRAMUSCULAR | Status: AC
  Administered 2014-01-13: 4 mg via INTRAVENOUS
  Filled 2014-01-13: qty 2

## 2014-01-13 MED ORDER — ONDANSETRON HCL 8 MG PO TABS: 8.0000 mg | ORAL_TABLET | Freq: Three times a day (TID) | ORAL | Status: DC | PRN

## 2014-01-13 MED ORDER — POTASSIUM CHLORIDE CRYS ER 20 MEQ PO TBCR
40.0000 meq | EXTENDED_RELEASE_TABLET | Freq: Once | ORAL | Status: AC
  Administered 2014-01-13: 40 meq via ORAL
  Filled 2014-01-13: qty 2

## 2014-01-13 MED ORDER — DICYCLOMINE HCL 10 MG/ML IM SOLN
20.0000 mg | Freq: Once | INTRAMUSCULAR | Status: AC
  Administered 2014-01-13: 20 mg via INTRAMUSCULAR
  Filled 2014-01-13: qty 2

## 2014-01-13 MED ORDER — SODIUM CHLORIDE 0.9 % IV BOLUS (SEPSIS)
1000.0000 mL | Freq: Once | INTRAVENOUS | Status: AC
  Administered 2014-01-13: 1000 mL via INTRAVENOUS

## 2014-01-13 NOTE — ED Provider Notes (Signed)
CSN: [id number]     Arrival date & time 01/13/14  1149 History   First MD Initiated Contact with Patient 01/13/14 1158     Chief Complaint  Patient presents with  . Emesis     (Consider location/radiation/quality/duration/timing/severity/associated sxs/prior Treatment) HPI Comments: The patient is a 53 year old female with history of cyclic vomiting syndrome, migraines, depression, asthma, DDD, and GERD who presents today for vomiting and abdominal pain. She was seen twice in the emergency department yesterday. Her pain began two days ago and is not improving. She reports that her pain is in her epigastric area. The pain does not radiate. She cannot give quality to this pain. Normally with her cyclic vomiting syndrome the symptoms improved after 24 hours, but this time it is not going away. This does feel like the same pain she has with her cyclic vomiting syndrome. She reports that she tried to go to her GI doctor today, but they were not open. She denies fevers, chills, shortness of breath, diarrhea. Her last bowel movement was the day before yesterday, but she blames that on not eating much over the past 2 days.   Patient is a 53 y.o. female presenting with vomiting. The history is provided by the patient. No language interpreter was used.  Emesis Associated symptoms: abdominal pain   Associated symptoms: no chills and no diarrhea     Past Medical History  Diagnosis Date  . Menopausal symptoms   . Migraines   . Recurrent sinus infections   . Depression   . Asthma   . Eczema   . Cervical dysplasia   . Allergy   . Arthritis   . GERD (gastroesophageal reflux disease)   . DDD (degenerative disc disease), cervical     Cervical and Lumbar   Past Surgical History  Procedure Laterality Date  . Tonsillectomy  1990  . Nasal sinus surgery  1992  . Colposcopy     Family History  Problem Relation Age of Onset  . Heart disease Mother   . Hypertension Father   . Breast cancer Maternal  Aunt     Age 53's  . Diabetes Paternal Grandfather   . Heart disease Paternal Grandfather   . Stroke Maternal Grandmother   . Stroke Maternal Grandfather    History  Substance Use Topics  . Smoking status: Current Every Day Smoker -- 1.50 packs/day for 10 years    Types: Cigarettes  . Smokeless tobacco: Not on file  . Alcohol Use: No     Comment: rare   OB History   Grav Para Term Preterm Abortions TAB SAB Ect Mult Living   1    1     0     Review of Systems  Constitutional: Positive for appetite change. Negative for fever and chills.  Respiratory: Negative for shortness of breath.   Cardiovascular: Negative for chest pain.  Gastrointestinal: Positive for nausea, vomiting and abdominal pain. Negative for diarrhea.  All other systems reviewed and are negative.      Allergies  Avelox  Home Medications   Current Outpatient Rx  Name  Route  Sig  Dispense  Refill  . albuterol (PROAIR HFA) 108 (90 BASE) MCG/ACT inhaler   Inhalation   Inhale 2 puffs into the lungs every 6 (six) hours as needed for wheezing.   1 Inhaler   2   . ALPRAZolam (XANAX) 0.25 MG tablet   Oral   Take 0.25 mg by mouth at bedtime as needed for anxiety or  sleep.         . cetirizine (ZYRTEC) 10 MG tablet   Oral   Take 10 mg by mouth every morning.          . Estradiol (ESTROGEL) 0.75 MG/1.25 GM (0.06%) topical gel   Transdermal   Place 1.25 g onto the skin daily.   1 Bottle   12   . montelukast (SINGULAIR) 10 MG tablet   Oral   Take 1 tablet (10 mg total) by mouth at bedtime.   30 tablet   11   . ondansetron (ZOFRAN) 8 MG tablet   Oral   Take 1 tablet (8 mg total) by mouth every 8 (eight) hours as needed for nausea or vomiting.   20 tablet   0   . oxyCODONE-acetaminophen (PERCOCET) 10-325 MG per tablet   Oral   Take 1 tablet by mouth every 8 (eight) hours as needed for pain.         Marland Kitchen venlafaxine (EFFEXOR) 37.5 MG tablet   Oral   Take 112.5 mg by mouth daily.           BP 191/101  Pulse 65  Temp(Src) 98.5 F (36.9 C) (Oral)  Resp 26  SpO2 100% Physical Exam  Nursing note and vitals reviewed. Constitutional: She is oriented to person, place, and time. She appears well-developed and well-nourished. She appears distressed.  Patient is rolling around on the bed dry heaving.   HENT:  Head: Normocephalic and atraumatic.  Right Ear: External ear normal.  Left Ear: External ear normal.  Nose: Nose normal.  Mouth/Throat: Oropharynx is clear and moist.  Eyes: Conjunctivae are normal.  Neck: Normal range of motion.  Cardiovascular: Normal rate, regular rhythm and normal heart sounds.   Pulmonary/Chest: Effort normal and breath sounds normal. No stridor. No respiratory distress. She has no wheezes. She has no rales.  Abdominal: Soft. Bowel sounds are normal. She exhibits no distension. There is tenderness in the epigastric area. There is no rigidity, no rebound and no guarding.  Musculoskeletal: Normal range of motion.  Neurological: She is alert and oriented to person, place, and time. She has normal strength.  Skin: Skin is warm and dry. She is not diaphoretic. No erythema.  Psychiatric: She has a normal mood and affect. Her behavior is normal.    ED Course  Procedures (including critical care time) Labs Review Labs Reviewed  CBC WITH DIFFERENTIAL - Abnormal; Notable for the following:    WBC 11.7 (*)    Neutro Abs 8.3 (*)    All other components within normal limits  COMPREHENSIVE METABOLIC PANEL - Abnormal; Notable for the following:    Sodium 136 (*)    Potassium 3.4 (*)    CO2 18 (*)    Glucose, Bld 113 (*)    All other components within normal limits  LIPASE, BLOOD   Imaging Review Dg Abd Acute W/chest  01/13/2014   CLINICAL DATA:  Upper abdominal pain.  Vomiting.  EXAM: ACUTE ABDOMEN SERIES (ABDOMEN 2 VIEW & CHEST 1 VIEW)  COMPARISON:  DG ESOPHAGUS dated 04/10/2012  FINDINGS: The lungs appear clear.  Cardiac and mediastinal contours  normal.  No pleural effusion identified.  No free intraperitoneal gas noted beneath the hemidiaphragms. There are air-fluid levels in the proximal colon, which can be normal. Formed stool noted in the distal colon. No dilated bowel observed.  Calcifications projecting in the lower urinary bladder are probably vascular. Similarly, a linear calcific density projecting below the right sacroiliac  joint is probably vascular/incidental, and less likely to be a ureteral calculus.  Lower lumbar degenerative disc disease noted with loss of intervertebral disc height.  IMPRESSION: 1. Unremarkable bowel gas pattern. 2. Calcifications in the anatomic pelvis are probably vascular. 3. Lower lumbar degenerative disc disease.   Electronically Signed   By: Herbie Baltimore M.D.   On: 01/13/2014 14:33    EKG Interpretation   None       MDM   Final diagnoses:  Nausea & vomiting  Abdominal pain    Patient is nontoxic, nonseptic appearing, in no apparent distress.  Patient's pain and other symptoms adequately managed in emergency department.  Fluid bolus given.  Labs, imaging and vitals reviewed.  Patient does not meet the SIRS or Sepsis criteria.  On repeat exam patient does not have a surgical abdomen and there are no peritoneal signs. Patient feels significantly improved and is tolerating saltines and fluids. No indication of appendicitis, bowel obstruction, bowel perforation, cholecystitis, diverticulitis, PID or ectopic pregnancy.  Pregnancy test from early today was negative. Did not repeat at this visit. Patient discharged home with symptomatic treatment and given strict instructions for follow-up with their GI physician.  I have also discussed reasons to return immediately to the ER.  Patient expresses understanding and agrees with plan. Dr. Criss Alvine evaluated patient and agrees with plan. Patient / Family / Caregiver informed of clinical course, understand medical decision-making process, and agree with  plan.   Medications  sodium chloride 0.9 % bolus 1,000 mL (1,000 mLs Intravenous New Bag/Given 01/13/14 1307)  ondansetron (ZOFRAN) injection 4 mg (4 mg Intravenous Given 01/13/14 1307)  morphine 4 MG/ML injection 4 mg (4 mg Intravenous Given 01/13/14 1307)  promethazine (PHENERGAN) injection 25 mg (25 mg Intravenous Given 01/13/14 1405)  dicyclomine (BENTYL) injection 20 mg (20 mg Intramuscular Given 01/13/14 1528)  HYDROmorphone (DILAUDID) injection 1 mg (1 mg Intravenous Given 01/13/14 1528)  ondansetron (ZOFRAN) injection 4 mg (4 mg Intravenous Given 01/13/14 1528)  pantoprazole (PROTONIX) injection 40 mg (40 mg Intravenous Given 01/13/14 1616)        Mora Bellman, PA-C 01/13/14 1650

## 2014-01-13 NOTE — ED Notes (Signed)
Patient transported to X-ray 

## 2014-01-13 NOTE — Discharge Instructions (Signed)
Abdominal Pain, Women °Abdominal (stomach, pelvic, or belly) pain can be caused by many things. It is important to tell your doctor: °· The location of the pain. °· Does it come and go or is it present all the time? °· Are there things that start the pain (eating certain foods, exercise)? °· Are there other symptoms associated with the pain (fever, nausea, vomiting, diarrhea)? °All of this is helpful to know when trying to find the cause of the pain. °CAUSES  °· Stomach: virus or bacteria infection, or ulcer. °· Intestine: appendicitis (inflamed appendix), regional ileitis (Crohn's disease), ulcerative colitis (inflamed colon), irritable bowel syndrome, diverticulitis (inflamed diverticulum of the colon), or cancer of the stomach or intestine. °· Gallbladder disease or stones in the gallbladder. °· Kidney disease, kidney stones, or infection. °· Pancreas infection or cancer. °· Fibromyalgia (pain disorder). °· Diseases of the female organs: °· Uterus: fibroid (non-cancerous) tumors or infection. °· Fallopian tubes: infection or tubal pregnancy. °· Ovary: cysts or tumors. °· Pelvic adhesions (scar tissue). °· Endometriosis (uterus lining tissue growing in the pelvis and on the pelvic organs). °· Pelvic congestion syndrome (female organs filling up with blood just before the menstrual period). °· Pain with the menstrual period. °· Pain with ovulation (producing an egg). °· Pain with an IUD (intrauterine device, birth control) in the uterus. °· Cancer of the female organs. °· Functional pain (pain not caused by a disease, may improve without treatment). °· Psychological pain. °· Depression. °DIAGNOSIS  °Your doctor will decide the seriousness of your pain by doing an examination. °· Blood tests. °· X-rays. °· Ultrasound. °· CT scan (computed tomography, special type of X-ray). °· MRI (magnetic resonance imaging). °· Cultures, for infection. °· Barium enema (dye inserted in the large intestine, to better view it with  X-rays). °· Colonoscopy (looking in intestine with a lighted tube). °· Laparoscopy (minor surgery, looking in abdomen with a lighted tube). °· Major abdominal exploratory surgery (looking in abdomen with a large incision). °TREATMENT  °The treatment will depend on the cause of the pain.  °· Many cases can be observed and treated at home. °· Over-the-counter medicines recommended by your caregiver. °· Prescription medicine. °· Antibiotics, for infection. °· Birth control pills, for painful periods or for ovulation pain. °· Hormone treatment, for endometriosis. °· Nerve blocking injections. °· Physical therapy. °· Antidepressants. °· Counseling with a psychologist or psychiatrist. °· Minor or major surgery. °HOME CARE INSTRUCTIONS  °· Do not take laxatives, unless directed by your caregiver. °· Take over-the-counter pain medicine only if ordered by your caregiver. Do not take aspirin because it can cause an upset stomach or bleeding. °· Try a clear liquid diet (broth or water) as ordered by your caregiver. Slowly move to a bland diet, as tolerated, if the pain is related to the stomach or intestine. °· Have a thermometer and take your temperature several times a day, and record it. °· Bed rest and sleep, if it helps the pain. °· Avoid sexual intercourse, if it causes pain. °· Avoid stressful situations. °· Keep your follow-up appointments and tests, as your caregiver orders. °· If the pain does not go away with medicine or surgery, you may try: °· Acupuncture. °· Relaxation exercises (yoga, meditation). °· Group therapy. °· Counseling. °SEEK MEDICAL CARE IF:  °· You notice certain foods cause stomach pain. °· Your home care treatment is not helping your pain. °· You need stronger pain medicine. °· You want your IUD removed. °· You feel faint or   lightheaded. °· You develop nausea and vomiting. °· You develop a rash. °· You are having side effects or an allergy to your medicine. °SEEK IMMEDIATE MEDICAL CARE IF:  °· Your  pain does not go away or gets worse. °· You have a fever. °· Your pain is felt only in portions of the abdomen. The right side could possibly be appendicitis. The left lower portion of the abdomen could be colitis or diverticulitis. °· You are passing blood in your stools (bright red or black tarry stools, with or without vomiting). °· You have blood in your urine. °· You develop chills, with or without a fever. °· You pass out. °MAKE SURE YOU:  °· Understand these instructions. °· Will watch your condition. °· Will get help right away if you are not doing well or get worse. °Document Released: 09/01/2007 Document Revised: 01/27/2012 Document Reviewed: 09/21/2009 °ExitCare® Patient Information ©2014 [employer]. °Nausea and Vomiting °Nausea is a sick feeling that often comes before throwing up (vomiting). Vomiting is a reflex where stomach contents come out of your mouth. Vomiting can cause severe loss of body fluids (dehydration). Children and elderly adults can become dehydrated quickly, especially if they also have diarrhea. Nausea and vomiting are symptoms of a condition or disease. It is important to find the cause of your symptoms. °CAUSES  °· Direct irritation of the stomach lining. This irritation can result from increased acid production (gastroesophageal reflux disease), infection, food poisoning, taking certain medicines (such as nonsteroidal anti-inflammatory drugs), alcohol use, or tobacco use. °· Signals from the brain. These signals could be caused by a headache, heat exposure, an inner ear disturbance, increased pressure in the brain from injury, infection, a tumor, or a concussion, pain, emotional stimulus, or metabolic problems. °· An obstruction in the gastrointestinal tract (bowel obstruction). °· Illnesses such as diabetes, hepatitis, gallbladder problems, appendicitis, kidney problems, cancer, sepsis, atypical symptoms of a heart attack, or eating disorders. °· Medical treatments such as  chemotherapy and radiation. °· Receiving medicine that makes you sleep (general anesthetic) during surgery. °DIAGNOSIS °Your caregiver may ask for tests to be done if the problems do not improve after a few days. Tests may also be done if symptoms are severe or if the reason for the nausea and vomiting is not clear. Tests may include: °· Urine tests. °· Blood tests. °· Stool tests. °· Cultures (to look for evidence of infection). °· X-rays or other imaging studies. °Test results can help your caregiver make decisions about treatment or the need for additional tests. °TREATMENT °You need to stay well hydrated. Drink frequently but in small amounts. You may wish to drink water, sports drinks, clear broth, or eat frozen ice pops or gelatin dessert to help stay hydrated. When you eat, eating slowly may help prevent nausea. There are also some antinausea medicines that may help prevent nausea. °HOME CARE INSTRUCTIONS  °· Take all medicine as directed by your caregiver. °· If you do not have an appetite, do not force yourself to eat. However, you must continue to drink fluids. °· If you have an appetite, eat a normal diet unless your caregiver tells you differently. °· Eat a variety of complex carbohydrates (rice, wheat, potatoes, bread), lean meats, yogurt, fruits, and vegetables. °· Avoid high-fat foods because they are more difficult to digest. °· Drink enough water and fluids to keep your urine clear or pale yellow. °· If you are dehydrated, ask your caregiver for specific rehydration instructions. Signs of dehydration may include: °· Severe thirst. °· Dry   lips and mouth. °· Dizziness. °· Dark urine. °· Decreasing urine frequency and amount. °· Confusion. °· Rapid breathing or pulse. °SEEK IMMEDIATE MEDICAL CARE IF:  °· You have blood or brown flecks (like coffee grounds) in your vomit. °· You have black or bloody stools. °· You have a severe headache or stiff neck. °· You are confused. °· You have severe abdominal  pain. °· You have chest pain or trouble breathing. °· You do not urinate at least once every 8 hours. °· You develop cold or clammy skin. °· You continue to vomit for longer than 24 to 48 hours. °· You have a fever. °MAKE SURE YOU:  °· Understand these instructions. °· Will watch your condition. °· Will get help right away if you are not doing well or get worse. °Document Released: 11/04/2005 Document Revised: 01/27/2012 Document Reviewed: 04/03/2011 °ExitCare® Patient Information ©2014 [employer]. ° °

## 2014-01-13 NOTE — ED Provider Notes (Signed)
Medical screening examination/treatment/procedure(s) were performed by non-physician practitioner and as supervising physician I was immediately available for consultation/collaboration.   Shanna CiscoMegan E Docherty, MD 01/13/14 [phone]

## 2014-01-13 NOTE — Discharge Instructions (Signed)
Take zofran as needed for nausea.   Drink plenty of fluids.  Follow up with your gastroenterologist.   You may return to the ER if symptoms worsen or you have any other concerns.

## 2014-01-13 NOTE — ED Provider Notes (Signed)
Medical screening examination/treatment/procedure(s) were performed by non-physician practitioner and as supervising physician I was immediately available for consultation/collaboration.   Helaman Mecca E Rayansh Herbst, MD 01/13/14 1623 

## 2014-01-13 NOTE — ED Notes (Signed)
Pt was BIB by EMS twice yesterday for vomiting.  Pt presents today w/ same complaint.  Pt states "I think there is blood in my vomit now".  Pt showed nurse and empty bag with spit in it. Pt dry heaving in triage.

## 2014-01-13 NOTE — ED Provider Notes (Signed)
Pt received from Piepenbrink, PA-C.  Pt presented w/ N/V and abdominal pain.  Has cyclic vomiting syndrome and sx are typical.  She received dilaudid and anti-emetics in ED and her sx are resolved.  She is tolerating pos.  Labs sig for anion gap and hypokalemia.  Potassium replaced and 2L NS bolus administered.  BMP rechecked and gap improved  (20 initially and now 16).  VSS.  Pt aware of test results.  D/c'd home w/ zofran.  Advised f/u w/ her gastroenterologist.  2:41 AM    Otilio Miuatherine E Orin Eberwein, PA-C 01/13/14 [phone]

## 2014-01-15 NOTE — ED Provider Notes (Signed)
Medical screening examination/treatment/procedure(s) were conducted as a shared visit with non-physician practitioner(s) and myself.  I personally evaluated the patient during the encounter.   EKG Interpretation None      Patient with recurrent abd pain and vomiting. No signs of peritonitis or other concern for surgical abdomen. No actual vomiting while in ED. Will treat symptomatically and encourage close f/u with her GI.  Katie Chang T Emauri Krygier, MD 01/15/14 [phone]

## 2014-01-17 ENCOUNTER — Other Ambulatory Visit: Payer: Self-pay | Admitting: Gastroenterology

## 2014-01-17 DIAGNOSIS — R1115 Cyclical vomiting syndrome unrelated to migraine: Secondary | ICD-10-CM

## 2014-01-20 ENCOUNTER — Other Ambulatory Visit: Payer: BC Managed Care – PPO

## 2014-01-24 ENCOUNTER — Ambulatory Visit
Admission: RE | Admit: 2014-01-24 | Discharge: 2014-01-24 | Disposition: A | Discharge status: Home / Self Care | Payer: BC Managed Care – PPO | Source: Ambulatory Visit | Attending: Gastroenterology | Admitting: Gastroenterology

## 2014-01-24 DIAGNOSIS — R1115 Cyclical vomiting syndrome unrelated to migraine: Secondary | ICD-10-CM

## 2014-03-11 ENCOUNTER — Other Ambulatory Visit: Payer: Self-pay | Admitting: *Deleted

## 2014-03-11 MED ORDER — ALPRAZOLAM 0.25 MG PO TABS: 0.2500 mg | ORAL_TABLET | Freq: Every evening | ORAL | Status: DC | PRN

## 2014-03-11 NOTE — Telephone Encounter (Signed)
Pharmacy faxed requesting refill. Last filled in May 2014. Please advise

## 2014-03-11 NOTE — Telephone Encounter (Signed)
rx called in

## 2014-04-22 ENCOUNTER — Other Ambulatory Visit: Payer: Self-pay | Admitting: Physician Assistant

## 2014-04-22 ENCOUNTER — Other Ambulatory Visit: Payer: Self-pay | Admitting: Women's Health

## 2014-05-12 ENCOUNTER — Other Ambulatory Visit: Payer: Self-pay | Admitting: Women's Health

## 2014-05-13 NOTE — Telephone Encounter (Signed)
Ok to refill rx

## 2014-06-14 ENCOUNTER — Other Ambulatory Visit: Payer: Self-pay | Admitting: Women's Health

## 2014-06-21 ENCOUNTER — Other Ambulatory Visit: Payer: Self-pay

## 2014-06-24 ENCOUNTER — Other Ambulatory Visit: Payer: Self-pay | Admitting: Women's Health

## 2014-07-13 ENCOUNTER — Telehealth: Payer: Self-pay | Admitting: Diagnostic Neuroimaging

## 2014-07-13 ENCOUNTER — Encounter: Payer: BC Managed Care – PPO | Admitting: Radiology

## 2014-07-13 ENCOUNTER — Encounter: Payer: BC Managed Care – PPO | Admitting: Diagnostic Neuroimaging

## 2014-07-13 NOTE — Telephone Encounter (Signed)
Ok. Thanks!

## 2014-07-13 NOTE — Telephone Encounter (Signed)
Patient called in to state that she is running a fever and has strep throat. She will not make it to her appointment this afternoon and will call back to reschedule.

## 2014-07-18 ENCOUNTER — Other Ambulatory Visit: Payer: Self-pay | Admitting: Women's Health

## 2014-07-19 NOTE — Telephone Encounter (Signed)
Called into pharmacy

## 2014-07-27 ENCOUNTER — Ambulatory Visit (INDEPENDENT_AMBULATORY_CARE_PROVIDER_SITE_OTHER): Payer: BC Managed Care – PPO | Admitting: Women's Health

## 2014-07-27 ENCOUNTER — Encounter: Payer: Self-pay | Admitting: Women's Health

## 2014-07-27 ENCOUNTER — Other Ambulatory Visit (HOSPITAL_COMMUNITY)
Admission: RE | Admit: 2014-07-27 | Discharge: 2014-07-27 | Disposition: A | Discharge status: Home / Self Care | Payer: BC Managed Care – PPO | Source: Ambulatory Visit | Attending: Women's Health | Admitting: Women's Health

## 2014-07-27 VITALS — BP 132/92 | Ht 65.0 in | Wt 185.4 lb

## 2014-07-27 DIAGNOSIS — Z1151 Encounter for screening for human papillomavirus (HPV): Secondary | ICD-10-CM | POA: Insufficient documentation

## 2014-07-27 DIAGNOSIS — M899 Disorder of bone, unspecified: Secondary | ICD-10-CM

## 2014-07-27 DIAGNOSIS — Z780 Asymptomatic menopausal state: Secondary | ICD-10-CM

## 2014-07-27 DIAGNOSIS — Z01419 Encounter for gynecological examination (general) (routine) without abnormal findings: Secondary | ICD-10-CM | POA: Diagnosis present

## 2014-07-27 DIAGNOSIS — M8580 Other specified disorders of bone density and structure, unspecified site: Secondary | ICD-10-CM

## 2014-07-27 DIAGNOSIS — M949 Disorder of cartilage, unspecified: Secondary | ICD-10-CM

## 2014-07-27 DIAGNOSIS — N951 Menopausal and female climacteric states: Secondary | ICD-10-CM

## 2014-07-27 MED ORDER — ESTRADIOL-NORETHINDRONE ACET 0.5-0.1 MG PO TABS: 1.0000 | ORAL_TABLET | Freq: Every day | ORAL | Status: DC

## 2014-07-27 NOTE — Patient Instructions (Signed)

## 2014-07-27 NOTE — Progress Notes (Signed)
Katie Chang [birth year] [id number]    History:    Presents for annual exam.  Postmenopausal  Since 2011 on HRT only used the estrogen evamist, states forgot most months the Prometrium. Smoker. Normal mammogram history, mammogram overdue. 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 12/2011 T score -1.2 left femoral neck, hip average -0.3, FRAX 4%/0.4%. Negative colonoscopy at age 53. Anxiety/depression managed by primary care. 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 Unemployed accountant, helping to care for elderly parents. Engaged marriage not planned at this time.  ROS:  A  12 point ROS was performed and pertinent positives and negatives are included.  Exam:  Filed Vitals:   07/27/14 1036  BP: 132/92    General appearance:  Normal Thyroid:  Symmetrical, normal in size, without palpable masses or nodularity. Respiratory  Auscultation:  Clear without wheezing or rhonchi Cardiovascular  Auscultation:  Regular rate, without rubs, murmurs or gallops  Edema/varicosities:  Not grossly evident Abdominal  Soft,nontender, without masses, guarding or rebound.  Liver/spleen:  No organomegaly noted  Hernia:  None appreciated  Skin  Inspection:  Grossly normal   Breasts: Examined lying and sitting.     Right: Without masses, retractions, discharge or axillary adenopathy.     Left: Without masses, retractions, discharge or axillary adenopathy. Gentitourinary   Inguinal/mons:  Normal without inguinal adenopathy  External genitalia:  Normal  BUS/Urethra/Skene's glands:  Normal  Vagina:  Normal  Cervix:  Normal  Uterus:   normal in size, shape and contour.  Midline and mobile  Adnexa/parametria:     Rt: Without masses or tenderness.   Lt: Without masses or tenderness.  Anus and perineum: Normal  Digital rectal exam: Normal sphincter tone without palpated masses or  tenderness  Assessment/Plan:  53 y.o. SWF G0 for annual exam.    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 Instructed to have a vitamin D level at annual exam with primary care. SBE's, schedule annual screening mammogram overdue reviewed importance. Repeat DEXA, will schedule. Ultrasound to check endometrial thickness, will schedule. HRT reviewed will try Activella 0.5/0.1 reviewed importance of both estrogen and progesterone. Instructed to call if continued hot flushes. Home safety and fall prevention discussed. Continue annual skin checks. Aware of hazards of smoking is trying to decrease the  Note: This dictation was prepared with Dragon/digital dictation.  Any transcriptional errors that result are unintentional. Harrington Challenger [facility], 11:04 AM 07/27/2014

## 2014-07-28 LAB — CYTOLOGY - PAP

## 2014-08-02 ENCOUNTER — Other Ambulatory Visit: Payer: Self-pay | Admitting: Gynecology

## 2014-08-02 DIAGNOSIS — M8580 Other specified disorders of bone density and structure, unspecified site: Secondary | ICD-10-CM

## 2014-08-03 ENCOUNTER — Other Ambulatory Visit: Payer: BC Managed Care – PPO

## 2014-08-03 ENCOUNTER — Ambulatory Visit: Payer: BC Managed Care – PPO | Admitting: Women's Health

## 2014-09-02 ENCOUNTER — Other Ambulatory Visit: Payer: Self-pay

## 2014-09-14 ENCOUNTER — Other Ambulatory Visit: Payer: Self-pay | Admitting: Women's Health

## 2014-09-15 NOTE — Telephone Encounter (Signed)
Called into pharmacy

## 2014-09-19 ENCOUNTER — Encounter: Payer: Self-pay | Admitting: Women's Health

## 2014-11-07 ENCOUNTER — Other Ambulatory Visit: Payer: Self-pay | Admitting: Women's Health

## 2014-11-08 NOTE — Telephone Encounter (Signed)
Rx called in to pharmacy. 

## 2014-12-29 ENCOUNTER — Emergency Department (HOSPITAL_COMMUNITY)
Admission: EM | Admit: 2014-12-29 | Discharge: 2014-12-29 | Disposition: A | Discharge status: Home / Self Care | Payer: BLUE CROSS/BLUE SHIELD | Attending: Emergency Medicine | Admitting: Emergency Medicine

## 2014-12-29 ENCOUNTER — Encounter (HOSPITAL_COMMUNITY): Payer: Self-pay | Admitting: Emergency Medicine

## 2014-12-29 DIAGNOSIS — G43A0 Cyclical vomiting, not intractable: Secondary | ICD-10-CM | POA: Diagnosis not present

## 2014-12-29 DIAGNOSIS — Z8742 Personal history of other diseases of the female genital tract: Secondary | ICD-10-CM | POA: Insufficient documentation

## 2014-12-29 DIAGNOSIS — Z720 Tobacco use: Secondary | ICD-10-CM | POA: Diagnosis not present

## 2014-12-29 DIAGNOSIS — K219 Gastro-esophageal reflux disease without esophagitis: Secondary | ICD-10-CM | POA: Insufficient documentation

## 2014-12-29 DIAGNOSIS — M1990 Unspecified osteoarthritis, unspecified site: Secondary | ICD-10-CM | POA: Insufficient documentation

## 2014-12-29 DIAGNOSIS — J45909 Unspecified asthma, uncomplicated: Secondary | ICD-10-CM | POA: Insufficient documentation

## 2014-12-29 DIAGNOSIS — Z872 Personal history of diseases of the skin and subcutaneous tissue: Secondary | ICD-10-CM | POA: Insufficient documentation

## 2014-12-29 DIAGNOSIS — Z8679 Personal history of other diseases of the circulatory system: Secondary | ICD-10-CM | POA: Insufficient documentation

## 2014-12-29 DIAGNOSIS — Z79899 Other long term (current) drug therapy: Secondary | ICD-10-CM | POA: Insufficient documentation

## 2014-12-29 DIAGNOSIS — F329 Major depressive disorder, single episode, unspecified: Secondary | ICD-10-CM | POA: Diagnosis not present

## 2014-12-29 DIAGNOSIS — R112 Nausea with vomiting, unspecified: Secondary | ICD-10-CM | POA: Diagnosis present

## 2014-12-29 DIAGNOSIS — R1115 Cyclical vomiting syndrome unrelated to migraine: Secondary | ICD-10-CM

## 2014-12-29 HISTORY — DX: Cyclical vomiting syndrome unrelated to migraine: R11.15

## 2014-12-29 LAB — CBC WITH DIFFERENTIAL/PLATELET
Basophils Absolute: 0 10*3/uL (ref 0.0–0.1)
Basophils Relative: 0 % (ref 0–1)
EOS ABS: 0.1 10*3/uL (ref 0.0–0.7)
EOS PCT: 1 % (ref 0–5)
HCT: 44.3 % (ref 36.0–46.0)
Hemoglobin: 14.9 g/dL (ref 12.0–15.0)
LYMPHS PCT: 16 % (ref 12–46)
Lymphs Abs: 1.6 10*3/uL (ref 0.7–4.0)
MCH: 29.4 pg (ref 26.0–34.0)
MCHC: 33.6 g/dL (ref 30.0–36.0)
MCV: 87.5 fL (ref 78.0–100.0)
MONO ABS: 0.4 10*3/uL (ref 0.1–1.0)
MONOS PCT: 4 % (ref 3–12)
Neutro Abs: 7.9 10*3/uL — ABNORMAL HIGH (ref 1.7–7.7)
Neutrophils Relative %: 79 % — ABNORMAL HIGH (ref 43–77)
Platelets: 465 10*3/uL — ABNORMAL HIGH (ref 150–400)
RBC: 5.06 MIL/uL (ref 3.87–5.11)
RDW: 13.9 % (ref 11.5–15.5)
WBC: 9.9 10*3/uL (ref 4.0–10.5)

## 2014-12-29 LAB — COMPREHENSIVE METABOLIC PANEL
ALT: 22 U/L (ref 0–35)
AST: 23 U/L (ref 0–37)
Albumin: 4.9 g/dL (ref 3.5–5.2)
Alkaline Phosphatase: 73 U/L (ref 39–117)
Anion gap: 13 (ref 5–15)
BILIRUBIN TOTAL: 0.6 mg/dL (ref 0.3–1.2)
BUN: 12 mg/dL (ref 6–23)
CHLORIDE: 106 mmol/L (ref 96–112)
CO2: 21 mmol/L (ref 19–32)
Calcium: 10.2 mg/dL (ref 8.4–10.5)
Creatinine, Ser: 0.75 mg/dL (ref 0.50–1.10)
GFR calc Af Amer: >90 mL/min (ref >90)
GFR calc non Af Amer: >90 mL/min (ref >90)
Glucose, Bld: 151 mg/dL — ABNORMAL HIGH (ref 70–99)
Potassium: 3.8 mmol/L (ref 3.5–5.1)
SODIUM: 140 mmol/L (ref 135–145)
Total Protein: 7.9 g/dL (ref 6.0–8.3)

## 2014-12-29 LAB — URINALYSIS, ROUTINE W REFLEX MICROSCOPIC
BILIRUBIN URINE: NEGATIVE
Glucose, UA: NEGATIVE mg/dL
Hgb urine dipstick: NEGATIVE
Ketones, ur: >80 mg/dL — AB
Leukocytes, UA: NEGATIVE
NITRITE: NEGATIVE
PROTEIN: NEGATIVE mg/dL
Specific Gravity, Urine: 1.017 (ref 1.005–1.030)
UROBILINOGEN UA: 0.2 mg/dL (ref 0.0–1.0)
pH: 8.5 — ABNORMAL HIGH (ref 5.0–8.0)

## 2014-12-29 LAB — LIPASE, BLOOD: LIPASE: 16 U/L (ref 11–59)

## 2014-12-29 MED ORDER — SODIUM CHLORIDE 0.9 % IV BOLUS (SEPSIS)
1000.0000 mL | Freq: Once | INTRAVENOUS | Status: AC
  Administered 2014-12-29: 1000 mL via INTRAVENOUS

## 2014-12-29 MED ORDER — HYDROXYZINE HCL 25 MG PO TABS: 25.0000 mg | ORAL_TABLET | Freq: Four times a day (QID) | ORAL | Status: DC

## 2014-12-29 MED ORDER — FAMOTIDINE IN NACL 20-0.9 MG/50ML-% IV SOLN
20.0000 mg | Freq: Once | INTRAVENOUS | Status: AC
  Administered 2014-12-29: 20 mg via INTRAVENOUS
  Filled 2014-12-29: qty 50

## 2014-12-29 MED ORDER — DIPHENHYDRAMINE HCL 50 MG/ML IJ SOLN
25.0000 mg | Freq: Once | INTRAMUSCULAR | Status: AC
  Administered 2014-12-29: 25 mg via INTRAVENOUS
  Filled 2014-12-29: qty 1

## 2014-12-29 MED ORDER — HYDROMORPHONE HCL 1 MG/ML IJ SOLN
1.0000 mg | Freq: Once | INTRAMUSCULAR | Status: AC
  Administered 2014-12-29: 1 mg via INTRAVENOUS
  Filled 2014-12-29: qty 1

## 2014-12-29 MED ORDER — METOCLOPRAMIDE HCL 5 MG/ML IJ SOLN
10.0000 mg | Freq: Once | INTRAMUSCULAR | Status: AC
  Administered 2014-12-29: 10 mg via INTRAVENOUS
  Filled 2014-12-29: qty 2

## 2014-12-29 MED ORDER — ONDANSETRON 8 MG PO TBDP
8.0000 mg | ORAL_TABLET | Freq: Once | ORAL | Status: AC
  Administered 2014-12-29: 8 mg via ORAL
  Filled 2014-12-29: qty 1

## 2014-12-29 MED ORDER — METOCLOPRAMIDE HCL 10 MG PO TABS: 10.0000 mg | ORAL_TABLET | Freq: Four times a day (QID) | ORAL | Status: DC | PRN

## 2014-12-29 MED ORDER — SODIUM CHLORIDE 0.9 % IV BOLUS (SEPSIS)
500.0000 mL | Freq: Once | INTRAVENOUS | Status: AC
  Administered 2014-12-29: 500 mL via INTRAVENOUS

## 2014-12-29 MED ORDER — ONDANSETRON HCL 4 MG/2ML IJ SOLN
4.0000 mg | Freq: Once | INTRAMUSCULAR | Status: AC
  Administered 2014-12-29: 4 mg via INTRAVENOUS
  Filled 2014-12-29: qty 2

## 2014-12-29 NOTE — ED Notes (Signed)
Pt reports severe upper abdominal pain, nausea, vomiting since 0500 today. Stated that she was seen by PCP one hour ago and given phenergan 50mg  IM and reffered to ED. Pt c/o pain and stated that she is was sweating, currently vomiting small amt liquid every few minutes. Pt is requesting Dilaudid for pain.

## 2014-12-29 NOTE — ED Notes (Signed)
Bed: WA22 Expected date:  Expected time:  Means of arrival:  Comments: EMS-N/V 

## 2014-12-29 NOTE — ED Provider Notes (Signed)
CSN: [id number]     Arrival date & time 12/29/14  1128 History   First MD Initiated Contact with Patient 12/29/14 1242     Chief Complaint  Patient presents with  . Abdominal Pain    started at 0500  . Nausea  . Emesis     (Consider location/radiation/quality/duration/timing/severity/associated sxs/prior Treatment) HPI Comments: Patient presents to the ER for evaluation of abdominal pain with vomiting. Patient reports a history of cyclic vomiting syndrome. Patient reports onset of severe upper abdominal pain with vomiting at 5 AM. She reports continuous vomiting through the course of today. She was seen her primary care doctor's office and was administered Phenergan, but did not improve. She was referred to the ER for further treatment. Patient reports multiple similar episodes. Pain that she is experiencing today is identical to pain she has had with her cyclic vomiting syndrome in the past. She reports sharp, stabbing, severe pain in the epigastric region that is constant. She cannot hold anything down.  Patient is a 54 y.o. female presenting with abdominal pain and vomiting.  Abdominal Pain Associated symptoms: nausea and vomiting   Emesis Associated symptoms: abdominal pain     Past Medical History  Diagnosis Date  . Menopausal symptoms   . Migraines   . Recurrent sinus infections   . Depression   . Asthma   . Eczema   . Cervical dysplasia   . Allergy   . Arthritis   . GERD (gastroesophageal reflux disease)   . DDD (degenerative disc disease), cervical     Cervical and Lumbar  . Cyclic vomiting syndrome    Past Surgical History  Procedure Laterality Date  . Tonsillectomy  1990  . Nasal sinus surgery  1992  . Colposcopy     Family History  Problem Relation Age of Onset  . Heart disease Mother   . Hypertension Father   . Breast cancer Maternal Aunt     Age 54's  . Diabetes Paternal Grandfather   . Heart disease Paternal Grandfather   . Stroke Maternal Grandmother    . Stroke Maternal Grandfather    History  Substance Use Topics  . Smoking status: Current Every Day Smoker -- 1.50 packs/day for 10 years    Types: Cigarettes  . Smokeless tobacco: Not on file  . Alcohol Use: No     Comment: rare   OB History    Gravida Para Term Preterm AB TAB SAB Ectopic Multiple Living   1    1     0     Review of Systems  Gastrointestinal: Positive for nausea, vomiting and abdominal pain.  All other systems reviewed and are negative.     Allergies  Avelox  Home Medications   Prior to Admission medications   Medication Sig Start Date End Date Taking? Authorizing Provider  albuterol (PROAIR HFA) 108 (90 BASE) MCG/ACT inhaler Inhale 2 puffs into the lungs every 6 (six) hours as needed for wheezing. 03/11/13  Yes Ryan M Dunn, PA-C  ALPRAZolam (XANAX) 0.25 MG tablet TAKE 1 TABLET AT BEDTIME AS NEEDED FOR ANXIETY. 11/08/14  Yes Harrington ChallengerNancy J Young, NP  estradiol-norethindrone (ACTIVELLA) 1-0.5 MG per tablet Take 1 tablet by mouth daily.   Yes Historical Provider, MD  Estradiol-Norethindrone Acet 0.5-0.1 MG per tablet Take 1 tablet by mouth daily. 07/27/14  Yes Harrington ChallengerNancy J Young, NP  levocetirizine (XYZAL) 5 MG tablet Take 1 tablet by mouth daily. 12/07/14  Yes Historical Provider, MD  montelukast (SINGULAIR) 10 MG tablet  TAKE ONE TABLET AT BEDTIME. 04/22/14  Yes Ethelda Chick, MD  NEXIUM 40 MG capsule Take 1 capsule by mouth daily. 12/07/14  Yes Historical Provider, MD  ondansetron (ZOFRAN) 8 MG tablet Take 1 tablet (8 mg total) by mouth every 8 (eight) hours as needed for nausea or vomiting. 01/13/14  Yes Arie Sabina Schinlever, PA-C  oxyCODONE-acetaminophen (PERCOCET) 10-325 MG per tablet Take 1 tablet by mouth every 8 (eight) hours as needed for pain.   Yes Historical Provider, MD  venlafaxine (EFFEXOR) 37.5 MG tablet Take 112.5 mg by mouth daily.   Yes Historical Provider, MD  ESTROGEL 0.75 MG/1.25 GM (0.06%) topical gel APPLY 1.25GM ONTO THE SKIN DAILY. Patient not  taking: Reported on 12/29/2014 06/24/14   Harrington Challenger, NP  ondansetron (ZOFRAN) 4 MG tablet Take 1 tablet (4 mg total) by mouth every 6 (six) hours. Patient not taking: Reported on 12/29/2014 01/13/14   Mora Bellman, PA-C  oxyCODONE-acetaminophen (PERCOCET/ROXICET) 5-325 MG per tablet Take 2 tablets by mouth every 4 (four) hours as needed for severe pain. Patient not taking: Reported on 12/29/2014 01/13/14   Mora Bellman, PA-C   BP 159/80 mmHg  Pulse 60  Temp(Src) 98.7 F (37.1 C) (Oral)  Resp 18  SpO2 100% Physical Exam  Constitutional: She is oriented to person, place, and time. She appears well-developed and well-nourished. She appears distressed.  HENT:  Head: Normocephalic and atraumatic.  Right Ear: Hearing normal.  Left Ear: Hearing normal.  Nose: Nose normal.  Mouth/Throat: Oropharynx is clear and moist and mucous membranes are normal.  Eyes: Conjunctivae and EOM are normal. Pupils are equal, round, and reactive to light.  Neck: Normal range of motion. Neck supple.  Cardiovascular: Regular rhythm, S1 normal and S2 normal.  Exam reveals no gallop and no friction rub.   No murmur heard. Pulmonary/Chest: Effort normal and breath sounds normal. No respiratory distress. She exhibits no tenderness.  Abdominal: Soft. Normal appearance and bowel sounds are normal. There is no hepatosplenomegaly. There is tenderness in the epigastric area. There is no rebound, no guarding, no tenderness at McBurney's point and negative Murphy's sign. No hernia.  Musculoskeletal: Normal range of motion.  Neurological: She is alert and oriented to person, place, and time. She has normal strength. No cranial nerve deficit or sensory deficit. Coordination normal. GCS eye subscore is 4. GCS verbal subscore is 5. GCS motor subscore is 6.  Skin: Skin is warm, dry and intact. No rash noted. No cyanosis.  Psychiatric: She has a normal mood and affect. Her speech is normal and behavior is normal. Thought content  normal.  Nursing note and vitals reviewed.   ED Course  Procedures (including critical care time) Labs Review Labs Reviewed  CBC WITH DIFFERENTIAL/PLATELET - Abnormal; Notable for the following:    Platelets 465 (*)    Neutrophils Relative % 79 (*)    Neutro Abs 7.9 (*)    All other components within normal limits  COMPREHENSIVE METABOLIC PANEL - Abnormal; Notable for the following:    Glucose, Bld 151 (*)    All other components within normal limits  URINALYSIS, ROUTINE W REFLEX MICROSCOPIC - Abnormal; Notable for the following:    pH 8.5 (*)    Ketones, ur >80 (*)    All other components within normal limits  LIPASE, BLOOD    Imaging Review No results found.   EKG Interpretation None      MDM   Final diagnoses:  None   cyclic  vomiting syndrome  She presents to the ER for evaluation of abdominal pain, nausea and vomiting. Patient has a history of cyclic vomiting syndrome. Symptoms are typical for her, according to the patient. She did have epigastric discomfort, but this has largely resolved with a dose of Dilantin, Reglan, Benadryl. She was also hydrated. She is feeling much improved. Vital signs are stable, lab work is normal. Patient does still have some mild discomfort, will be given an additional dose of pain medication and discharged with outpatient follow-up with PCP, continued outpatient symptomatic treatment.    Gilda Crease, MD 12/29/14 [phone]

## 2014-12-29 NOTE — Discharge Instructions (Signed)
Cyclic Vomiting Syndrome °Cyclic vomiting syndrome is a benign condition in which patients experience bouts or cycles of severe nausea and vomiting that last for hours or even days. The bouts of nausea and vomiting alternate with longer periods of no symptoms and generally good health. Cyclic vomiting syndrome occurs mostly in children, but can affect adults. °CAUSES  °CVS has no known cause. Each episode is typically similar to the previous ones. The episodes tend to:  °· Start at about the same time of day. °· Last the same length of time. °· Present the same symptoms at the same level of intensity. °Cyclic vomiting syndrome can begin at any age in children and adults. Cyclic vomiting syndrome usually starts between the ages of 3 and 7 years. In adults, episodes tend to occur less often than they do in children, but they last longer. Furthermore, the events or situations that trigger episodes in adults cannot always be pinpointed as easily as they can in children. °There are 4 phases of cyclic vomiting syndrome: °1. Prodrome. The prodrome phase signals that an episode of nausea and vomiting is about to begin. This phase can last from just a few minutes to several hours. This phase is often marked by belly (abdominal) pain. Sometimes taking medicine early in the prodrome phase can stop an episode in progress. However, sometimes there is no warning. A person may simply wake up in the middle of the night or early morning and begin vomiting. °2. Episode. The episode phase consists of: °· Severe vomiting. °· Nausea. °· Gagging (retching). °3. Recovery. The recovery phase begins when the nausea and vomiting stop. Healthy color, appetite, and energy return. °4. Symptom-free interval. The symptom-free interval phase is the period between episodes when no symptoms are present. °TRIGGERS °Episodes can be triggered by an infection or event. Examples of triggers include: °· Infections. °· Colds, allergies, sinus problems, and  the flu. °· Eating certain foods such as chocolate or cheese. °· Foods with monosodium glutamate (MSG) or preservatives. °· Fast foods. °· Pre-packaged foods. °· Foods with low nutritional value (junk foods). °· Overeating. °· Eating just before going to bed. °· Hot weather. °· Dehydration. °· Not enough sleep or poor sleep quality. °· Physical exhaustion. °· Menstruation. °· Motion sickness. °· Emotional stress (school or home difficulties). °· Excitement or stress. °SYMPTOMS  °The main symptoms of cyclic vomiting syndrome are: °· Severe vomiting. °· Nausea. °· Gagging (retching). °Episodes usually begin at night or the first thing in the morning. Episodes may include vomiting or retching up to 5 or 6 times an hour during the worst of the episode. Episodes usually last anywhere from 1 to 4 days. Episodes can last for up to 10 days. Other symptoms include: °· Paleness. °· Exhaustion. °· Listlessness. °· Abdominal pain. °· Loose stools or diarrhea. °Sometimes the nausea and vomiting are so severe that a person appears to be almost unconscious. Sensitivity to light, headache, fever, dizziness, may also accompany an episode. In addition, the vomiting may cause drooling and excessive thirst. Drinking water usually leads to more vomiting, though the water can dilute the acid in the vomit, making the episode a little less painful. Continuous vomiting can lead to dehydration, which means that the body has lost excessive water and salts. °DIAGNOSIS  °Cyclic vomiting syndrome is hard to diagnose because there are no clear tests to identify it. A caregiver must diagnose cyclic vomiting syndrome by looking at symptoms and medical history. A caregiver must exclude more common diseases   or disorders that can also cause nausea and vomiting. Also, diagnosis takes time because caregivers need to identify a pattern or cycle to the vomiting. °TREATMENT  °Cyclic vomiting syndrome cannot be cured. Treatment varies, but people with  cyclic vomiting syndrome should get plenty of rest and sleep and take medications that prevent, stop, or lessen the vomiting episodes and other symptoms. °People whose episodes are frequent and long-lasting may be treated during the symptom-free intervals in an effort to prevent or ease future episodes. The symptom-free phase is a good time to eliminate anything known to trigger an episode. For example, if episodes are brought on by stress or excitement, this period is the time to find ways to reduce stress and stay calm. If sinus problems or allergies cause episodes, those conditions should be treated. The triggers listed above should be avoided or prevented. °Because of the similarities between migraine and cyclic vomiting syndrome, caregivers treat some people with severe cyclic vomiting syndrome with drugs that are also used for migraine headaches. The drugs are designed to: °· Prevent episodes. °· Reduce their frequency. °· Lessen their severity. °HOME CARE INSTRUCTIONS °Once a vomiting episode begins, treatment is supportive. It helps to stay in bed and sleep in a dark, quiet room. Severe nausea and vomiting may require hospitalization and intravenous (IV) fluids to prevent dehydration. Relaxing medications (sedatives) may help if the nausea continues. Sometimes, during the prodrome phase, it is possible to stop an episode from happening altogether. Only take over-the-counter or prescription medicines for pain, discomfort or fever as directed by your caregiver. Do not give aspirin to children. °During the recovery phase, drinking water and replacing lost electrolytes (salts in the blood) are very important. Electrolytes are salts that the body needs to function well and stay healthy. Symptoms during the recovery phase can vary. Some people find that their appetites return to normal immediately, while others need to begin by drinking clear liquids and then move slowly to solid food. °RELATED COMPLICATIONS °The  severe vomiting that defines cyclic vomiting syndrome is a risk factor for several complications: °· Dehydration--Vomiting causes the body to lose water quickly. °· Electrolyte imbalance--Vomiting also causes the body to lose the important salts it needs to keep working properly. °· Peptic esophagitis--The tube that connects the mouth to the stomach (esophagus) becomes injured from the stomach acid that comes up with the vomit. °· Hematemesis--The esophagus becomes irritated and bleeds, so blood mixes with the vomit. °· Mallory-Weiss tear--The lower end of the esophagus may tear open or the stomach may bruise from vomiting or retching. °· Tooth decay--The acid in the vomit can hurt the teeth by corroding the tooth enamel. °SEEK MEDICAL CARE IF: °You have questions or problems. °Document Released: 01/13/2002 Document Revised: 01/27/2012 Document Reviewed: 02/11/2011 °Exit[employer]® Patient Information ©2015 [employer]. This information is not intended to replace advice given to you by your health care provider. Make sure you discuss any questions you have with your health care provider. ° °

## 2015-01-09 ENCOUNTER — Other Ambulatory Visit: Payer: Self-pay | Admitting: Women's Health

## 2015-01-09 NOTE — Telephone Encounter (Signed)
Called into pharmacy

## 2015-02-02 ENCOUNTER — Encounter: Payer: Self-pay | Admitting: Women's Health

## 2015-03-08 ENCOUNTER — Other Ambulatory Visit: Payer: Self-pay

## 2015-03-08 ENCOUNTER — Other Ambulatory Visit: Payer: Self-pay | Admitting: Women's Health

## 2015-03-08 NOTE — Telephone Encounter (Signed)
Rx called in 

## 2015-03-09 ENCOUNTER — Other Ambulatory Visit: Payer: Self-pay | Admitting: Women's Health

## 2015-05-05 ENCOUNTER — Other Ambulatory Visit: Payer: Self-pay

## 2015-05-05 MED ORDER — ALPRAZOLAM 0.25 MG PO TABS: ORAL_TABLET | ORAL | Status: DC

## 2015-05-05 NOTE — Telephone Encounter (Signed)
Rx called in to pharmacy. 

## 2015-06-21 ENCOUNTER — Other Ambulatory Visit: Payer: Self-pay | Admitting: Sports Medicine

## 2015-06-21 DIAGNOSIS — M542 Cervicalgia: Secondary | ICD-10-CM

## 2015-07-03 ENCOUNTER — Ambulatory Visit
Admission: RE | Admit: 2015-07-03 | Discharge: 2015-07-03 | Disposition: A | Discharge status: Home / Self Care | Payer: BLUE CROSS/BLUE SHIELD | Source: Ambulatory Visit | Attending: Sports Medicine | Admitting: Sports Medicine

## 2015-07-03 DIAGNOSIS — M542 Cervicalgia: Secondary | ICD-10-CM

## 2015-07-04 ENCOUNTER — Other Ambulatory Visit: Payer: Self-pay | Admitting: Women's Health

## 2015-07-04 NOTE — Telephone Encounter (Signed)
Called into pharmacy

## 2015-08-01 ENCOUNTER — Encounter: Payer: Self-pay | Admitting: Women's Health

## 2015-08-01 ENCOUNTER — Ambulatory Visit (INDEPENDENT_AMBULATORY_CARE_PROVIDER_SITE_OTHER): Payer: BLUE CROSS/BLUE SHIELD | Admitting: Women's Health

## 2015-08-01 VITALS — BP 120/78 | Ht 65.0 in | Wt 184.0 lb

## 2015-08-01 DIAGNOSIS — Z01419 Encounter for gynecological examination (general) (routine) without abnormal findings: Secondary | ICD-10-CM | POA: Diagnosis not present

## 2015-08-01 DIAGNOSIS — G4700 Insomnia, unspecified: Secondary | ICD-10-CM

## 2015-08-01 DIAGNOSIS — Z780 Asymptomatic menopausal state: Secondary | ICD-10-CM

## 2015-08-01 DIAGNOSIS — M899 Disorder of bone, unspecified: Secondary | ICD-10-CM | POA: Diagnosis not present

## 2015-08-01 DIAGNOSIS — Z13820 Encounter for screening for osteoporosis: Secondary | ICD-10-CM | POA: Diagnosis not present

## 2015-08-01 DIAGNOSIS — M8580 Other specified disorders of bone density and structure, unspecified site: Secondary | ICD-10-CM

## 2015-08-01 MED ORDER — ESTRADIOL-NORETHINDRONE ACET 0.5-0.1 MG PO TABS: 1.0000 | ORAL_TABLET | Freq: Every day | ORAL | Status: DC

## 2015-08-01 MED ORDER — ALPRAZOLAM 0.25 MG PO TABS: ORAL_TABLET | ORAL | Status: DC

## 2015-08-01 NOTE — Patient Instructions (Signed)

## 2015-08-01 NOTE — Progress Notes (Signed)
Katie Chang November 23, 1960 [id number]    History:    Presents for annual exam.  Postmenopausal with no bleeding on Activella. Normal mammogram history. 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 2013 T score -1.2 left femoral neck FRAX 4%/0.4%. Negative colonoscopy at 50. Continues to smoke approximately one pack per day. Anxiety/depression, GERD, asthma managed per primary care. Has chronic neck pain has follow-up with a surgeon for probable neck surgery involving C3-6.  Past medical history, past surgical history, family history and social history were all reviewed and documented in the EPIC chart. Unemployed accountant, caring for elderly parents. Engaged not planning marriage at this time.  ROS:  A ROS was performed and pertinent positives and negatives are included.  Exam:  Filed Vitals:   08/01/15 1028  BP: 120/78    General appearance:  Normal Thyroid:  Symmetrical, normal in size, without palpable masses or nodularity. Respiratory  Auscultation:  Clear without wheezing or rhonchi Cardiovascular  Auscultation:  Regular rate, without rubs, murmurs or gallops  Edema/varicosities:  Not grossly evident Abdominal  Soft,nontender, without masses, guarding or rebound.  Liver/spleen:  No organomegaly noted  Hernia:  None appreciated  Skin  Inspection:  Grossly normal   Breasts: Examined lying and sitting.     Right: Without masses, retractions, discharge or axillary adenopathy.     Left: Without masses, retractions, discharge or axillary adenopathy. Gentitourinary   Inguinal/mons:  Normal without inguinal adenopathy  External genitalia:  Normal  BUS/Urethra/Skene's glands:  Normal  Vagina:  Normal  Cervix:  Normal  Uterus:  normal in size, shape and contour.  Midline and mobile  Adnexa/parametria:     Rt: Without masses or tenderness.   Lt: Without masses or tenderness.  Anus and perineum: Normal  Digital rectal exam: Normal sphincter tone without palpated masses or  tenderness  Assessment/Plan:  54 y.o. S WF G1 P0 for annual exam.    Postmenopausal on HRT with no bleeding Chronic neck pain has follow-up scheduled with surgeon Osteopenia without elevated FRAX Smoker Anxiety/depression, GERD, asthma-primary care manages labs and meds  Plan: HRT reviewed risks of blood clots, strokes, breast cancer, reviewed best to use shortest amount of time, has been on Activella 1 will try Activella 0.5/0.1 prescription, proper use given and reviewed. Instructed to call if problems with change. SBE's, annual screening mammogram, calcium rich diet, vitamin D 2000 daily encouraged. Instructed to schedule DEXA, home safety, fall prevention and importance of weightbearing exercise reviewed which is difficult at this point -chronic neck and right knee pain. UA, Pap normal with negative HR HPV 2015, new screening guidelines reviewed. Aware of hazards of smoking reviewed importance of decreasing/quitting, Chantix reviewed and declined. Xanax 0.25 prescription, proper use given and reviewed, aware to use sparingly.   Harrington Challenger [name], 12:54 PM 08/01/2015

## 2015-12-05 ENCOUNTER — Other Ambulatory Visit: Payer: Self-pay | Admitting: Women's Health

## 2015-12-06 ENCOUNTER — Other Ambulatory Visit: Payer: Self-pay

## 2015-12-06 MED ORDER — ALPRAZOLAM 0.25 MG PO TABS: ORAL_TABLET | ORAL | Status: DC

## 2015-12-06 NOTE — Telephone Encounter (Signed)
Rx called in 

## 2015-12-06 NOTE — Telephone Encounter (Signed)
Okay for refill?  

## 2015-12-06 NOTE — Telephone Encounter (Signed)
Called into pharmacy

## 2015-12-06 NOTE — Telephone Encounter (Signed)
Last filled 07/22/15 with 2 refills

## 2016-01-02 ENCOUNTER — Other Ambulatory Visit: Payer: Self-pay | Admitting: Women's Health

## 2016-01-02 NOTE — Telephone Encounter (Signed)
Telephone call, states is having many stressors right now, currently seeing a counselor also, had to put her beloved dog down, breakup with long-term fianc, parents are elderly and in poor health helping to care for them. Reviewed will refill Xanax but may be best to be put on a daily medication, reviewed Xanax is addictive and not to be taking daily. Started to talk to her counselor about possible daily medication. Denies any feelings of harming self. Condolences given.  Please call in Xanax 0.25 #30 no refills.

## 2016-01-02 NOTE — Telephone Encounter (Signed)
Message left

## 2016-01-04 ENCOUNTER — Other Ambulatory Visit: Payer: Self-pay | Admitting: Women's Health

## 2016-01-05 NOTE — Telephone Encounter (Signed)
Rx was never called in from 01/02/16 per request refill encounter in patient station. Rx was called in today with no refills as notes.

## 2016-02-01 ENCOUNTER — Other Ambulatory Visit: Payer: Self-pay | Admitting: Women's Health

## 2016-02-02 NOTE — Telephone Encounter (Signed)
Message left

## 2016-02-05 ENCOUNTER — Other Ambulatory Visit: Payer: Self-pay | Admitting: Women's Health

## 2016-02-05 NOTE — Telephone Encounter (Signed)
Katie Chang, She actually had her CE in September so not overdue.  [facility] confirmed that she did pick up the Xanax prescription on 01/05/16.

## 2016-02-05 NOTE — Telephone Encounter (Signed)
I have not called it in yet. You want me to hold off until we speak with her?

## 2016-02-05 NOTE — Telephone Encounter (Signed)
Called into pharmacy

## 2016-02-05 NOTE — Telephone Encounter (Signed)
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

## 2016-02-05 NOTE — Telephone Encounter (Signed)
Please call and review overdue for annual.  (9-15 last ann)   Also xanax is not for daily use, addictive, to be used sparingly. Did she just get refill 3 wks ago?

## 2016-02-05 NOTE — Telephone Encounter (Signed)
I called both her home and cell  And no answer to either.  Thanks for clarifying.

## 2016-02-06 NOTE — Telephone Encounter (Signed)
Telephone call, reviewed addictive properties of Xanax not to use daily, states is using for sleep, helping to care for aging parents. Sleep hygiene reviewed.

## 2016-03-07 ENCOUNTER — Other Ambulatory Visit: Payer: Self-pay | Admitting: Women's Health

## 2016-03-08 ENCOUNTER — Telehealth: Payer: Self-pay | Admitting: *Deleted

## 2016-03-08 MED ORDER — ALPRAZOLAM 0.25 MG PO TABS: ORAL_TABLET | ORAL | Status: DC

## 2016-03-08 NOTE — Telephone Encounter (Signed)
Rx from [facility] today was printed, Rx for Xanax 0.25 mg tablet was called in.

## 2016-03-08 NOTE — Telephone Encounter (Signed)
Called into pharmacy

## 2016-03-18 DIAGNOSIS — S83242A Other tear of medial meniscus, current injury, left knee, initial encounter: Secondary | ICD-10-CM | POA: Diagnosis not present

## 2016-03-20 DIAGNOSIS — F4323 Adjustment disorder with mixed anxiety and depressed mood: Secondary | ICD-10-CM | POA: Diagnosis not present

## 2016-03-22 DIAGNOSIS — G43019 Migraine without aura, intractable, without status migrainosus: Secondary | ICD-10-CM | POA: Diagnosis not present

## 2016-03-22 DIAGNOSIS — G43719 Chronic migraine without aura, intractable, without status migrainosus: Secondary | ICD-10-CM | POA: Diagnosis not present

## 2016-04-09 DIAGNOSIS — F4323 Adjustment disorder with mixed anxiety and depressed mood: Secondary | ICD-10-CM | POA: Diagnosis not present

## 2016-04-18 DIAGNOSIS — L821 Other seborrheic keratosis: Secondary | ICD-10-CM | POA: Diagnosis not present

## 2016-04-18 DIAGNOSIS — D1801 Hemangioma of skin and subcutaneous tissue: Secondary | ICD-10-CM | POA: Diagnosis not present

## 2016-04-18 DIAGNOSIS — L578 Other skin changes due to chronic exposure to nonionizing radiation: Secondary | ICD-10-CM | POA: Diagnosis not present

## 2016-04-18 DIAGNOSIS — L812 Freckles: Secondary | ICD-10-CM | POA: Diagnosis not present

## 2016-04-23 DIAGNOSIS — S83242D Other tear of medial meniscus, current injury, left knee, subsequent encounter: Secondary | ICD-10-CM | POA: Diagnosis not present

## 2016-04-23 DIAGNOSIS — F4323 Adjustment disorder with mixed anxiety and depressed mood: Secondary | ICD-10-CM | POA: Diagnosis not present

## 2016-04-30 ENCOUNTER — Encounter (HOSPITAL_COMMUNITY): Payer: Self-pay

## 2016-04-30 ENCOUNTER — Emergency Department (HOSPITAL_COMMUNITY)
Admission: EM | Admit: 2016-04-30 | Discharge: 2016-05-01 | Disposition: A | Discharge status: Home / Self Care | Payer: BLUE CROSS/BLUE SHIELD | Attending: Emergency Medicine | Admitting: Emergency Medicine

## 2016-04-30 DIAGNOSIS — R1115 Cyclical vomiting syndrome unrelated to migraine: Secondary | ICD-10-CM

## 2016-04-30 DIAGNOSIS — J45909 Unspecified asthma, uncomplicated: Secondary | ICD-10-CM | POA: Insufficient documentation

## 2016-04-30 DIAGNOSIS — F1210 Cannabis abuse, uncomplicated: Secondary | ICD-10-CM | POA: Insufficient documentation

## 2016-04-30 DIAGNOSIS — F329 Major depressive disorder, single episode, unspecified: Secondary | ICD-10-CM | POA: Diagnosis not present

## 2016-04-30 DIAGNOSIS — R112 Nausea with vomiting, unspecified: Secondary | ICD-10-CM | POA: Diagnosis not present

## 2016-04-30 DIAGNOSIS — F17210 Nicotine dependence, cigarettes, uncomplicated: Secondary | ICD-10-CM | POA: Insufficient documentation

## 2016-04-30 DIAGNOSIS — Z79899 Other long term (current) drug therapy: Secondary | ICD-10-CM | POA: Diagnosis not present

## 2016-04-30 LAB — COMPREHENSIVE METABOLIC PANEL
ALK PHOS: 75 U/L (ref 38–126)
ALT: 20 U/L (ref 14–54)
AST: 27 U/L (ref 15–41)
Albumin: 4.8 g/dL (ref 3.5–5.0)
Anion gap: 16 — ABNORMAL HIGH (ref 5–15)
BILIRUBIN TOTAL: 0.7 mg/dL (ref 0.3–1.2)
BUN: 8 mg/dL (ref 6–20)
CHLORIDE: 97 mmol/L — AB (ref 101–111)
CO2: 22 mmol/L (ref 22–32)
CREATININE: 0.63 mg/dL (ref 0.44–1.00)
Calcium: 9.9 mg/dL (ref 8.9–10.3)
GFR calc non Af Amer: >60 mL/min (ref >60)
Glucose, Bld: 173 mg/dL — ABNORMAL HIGH (ref 65–99)
Potassium: 2.8 mmol/L — ABNORMAL LOW (ref 3.5–5.1)
Sodium: 135 mmol/L (ref 135–145)
Total Protein: 7.9 g/dL (ref 6.5–8.1)

## 2016-04-30 LAB — CBC
HCT: 29.5 % — ABNORMAL LOW (ref 36.0–46.0)
Hemoglobin: 10.7 g/dL — ABNORMAL LOW (ref 12.0–15.0)
MCH: 29.2 pg (ref 26.0–34.0)
MCHC: 36.3 g/dL — ABNORMAL HIGH (ref 30.0–36.0)
MCV: 80.6 fL (ref 78.0–100.0)
Platelets: 685 10*3/uL — ABNORMAL HIGH (ref 150–400)
RBC: 3.66 MIL/uL — AB (ref 3.87–5.11)
RDW: 14.6 % (ref 11.5–15.5)
WBC: 16 10*3/uL — ABNORMAL HIGH (ref 4.0–10.5)

## 2016-04-30 LAB — RAPID URINE DRUG SCREEN, HOSP PERFORMED
AMPHETAMINES: NOT DETECTED
BENZODIAZEPINES: POSITIVE — AB
Barbiturates: NOT DETECTED
COCAINE: NOT DETECTED
Opiates: NOT DETECTED
Tetrahydrocannabinol: POSITIVE — AB

## 2016-04-30 LAB — URINE MICROSCOPIC-ADD ON

## 2016-04-30 LAB — URINALYSIS, ROUTINE W REFLEX MICROSCOPIC
Bilirubin Urine: NEGATIVE
GLUCOSE, UA: NEGATIVE mg/dL
Ketones, ur: 40 mg/dL — AB
Leukocytes, UA: NEGATIVE
Nitrite: NEGATIVE
PH: 7.5 (ref 5.0–8.0)
Protein, ur: 30 mg/dL — AB
SPECIFIC GRAVITY, URINE: 1.019 (ref 1.005–1.030)

## 2016-04-30 LAB — LIPASE, BLOOD: Lipase: 19 U/L (ref 11–51)

## 2016-04-30 MED ORDER — DICYCLOMINE HCL 10 MG/ML IM SOLN
20.0000 mg | Freq: Once | INTRAMUSCULAR | Status: AC
  Administered 2016-05-01: 20 mg via INTRAMUSCULAR
  Filled 2016-04-30: qty 2

## 2016-04-30 MED ORDER — ONDANSETRON HCL 4 MG/2ML IJ SOLN
4.0000 mg | Freq: Once | INTRAMUSCULAR | Status: AC | PRN
  Administered 2016-04-30: 4 mg via INTRAVENOUS
  Filled 2016-04-30: qty 2

## 2016-04-30 MED ORDER — KETOROLAC TROMETHAMINE 30 MG/ML IJ SOLN
30.0000 mg | Freq: Once | INTRAMUSCULAR | Status: AC
  Administered 2016-05-01: 30 mg via INTRAVENOUS
  Filled 2016-04-30: qty 1

## 2016-04-30 MED ORDER — HALOPERIDOL LACTATE 5 MG/ML IJ SOLN
2.0000 mg | Freq: Once | INTRAMUSCULAR | Status: AC
  Administered 2016-05-01: 2 mg via INTRAVENOUS
  Filled 2016-04-30: qty 1

## 2016-04-30 MED ORDER — SODIUM CHLORIDE 0.9 % IV BOLUS (SEPSIS)
1000.0000 mL | Freq: Once | INTRAVENOUS | Status: AC
  Administered 2016-05-01: 1000 mL via INTRAVENOUS

## 2016-04-30 MED ORDER — SODIUM CHLORIDE 0.9 % IV BOLUS (SEPSIS)
1000.0000 mL | Freq: Once | INTRAVENOUS | Status: AC
  Administered 2016-04-30: 1000 mL via INTRAVENOUS

## 2016-04-30 NOTE — ED Notes (Signed)
Pt reminded of need for urine 

## 2016-04-30 NOTE — ED Notes (Signed)
Pt given female urinal and encouraged to urinate. Pt given privacy to do so

## 2016-04-30 NOTE — ED Notes (Signed)
Pt has cyclic vomiting syndrome and she states that she started at 8am and hasn't stopped all day

## 2016-04-30 NOTE — ED Provider Notes (Addendum)
CSN: [id number]     Arrival date & time 04/30/16  2026 History  By [id number] my name below, I, Katie Chang, attest that this documentation has been prepared under the direction and in the presence of Nox Talent, MD. Electronically Signed: Angelene GiovanniEmmanuella Chang, ED Scribe. 04/30/2016. 12:40 AM.    Chief Complaint  Patient presents with  . Emesis   Patient is a 55 y.o. female presenting with vomiting. The history is provided by the patient. No language interpreter was used.  Emesis Severity:  Moderate Duration:  1 day Timing:  Constant Quality:  Undigested food Progression:  Unchanged Chronicity:  Recurrent Recent urination:  Normal Context: not post-tussive   Relieved by:  Nothing Worsened by:  Nothing tried Ineffective treatments:  None tried Associated symptoms: no chills and no diarrhea   Risk factors: no alcohol use and no sick contacts    HPI Comments: Katie Chang is a 55 y.o. female with a hx of GERD and cyclic vomiting syndrome who presents to the Emergency Department complaining of multiple episodes of non-bloody vomiting onset yesterday morning. She states that she has a hx of cyclic vomiting syndrome and her last episode was in February 2017. She explains that she is unsure of what triggers her symptoms. No alleviating factors noted. Pt has not tried any medications for her vomiting PTA. No sick contacts noted. She denies any constipation, stating that her last BM was 2 days ago. No diarrhea, fever, or chills.    Past Medical History  Diagnosis Date  . Menopausal symptoms   . Migraines   . Recurrent sinus infections   . Depression   . Asthma   . Eczema   . Cervical dysplasia   . Allergy   . Arthritis   . GERD (gastroesophageal reflux disease)   . DDD (degenerative disc disease), cervical     Cervical and Lumbar  . Cyclic vomiting syndrome    Past Surgical History  Procedure Laterality Date  . Tonsillectomy  1990  . Nasal sinus surgery  1992  . Colposcopy      Family History  Problem Relation Age of Onset  . Heart disease Mother   . Hypertension Father   . Breast cancer Maternal Aunt     Age 55's  . Diabetes Paternal Grandfather   . Heart disease Paternal Grandfather   . Stroke Maternal Grandmother   . Stroke Maternal Grandfather    Social History  Substance Use Topics  . Smoking status: Current Every Day Smoker -- 1.50 packs/day for 10 years    Types: Cigarettes  . Smokeless tobacco: None  . Alcohol Use: No     Comment: rare   OB History    Gravida Para Term Preterm AB TAB SAB Ectopic Multiple Living   1    1     0     Review of Systems  Constitutional: Negative for fever and chills.  Gastrointestinal: Positive for nausea and vomiting. Negative for diarrhea.  All other systems reviewed and are negative.     Allergies  Avelox  Home Medications   Prior to Admission medications   Medication Sig Start Date End Date Taking? Authorizing Provider  albuterol (PROAIR HFA) 108 (90 BASE) MCG/ACT inhaler Inhale 2 puffs into the lungs every 6 (six) hours as needed for wheezing. 03/11/13  Yes Ryan M Dunn, PA-C  ALPRAZolam Prudy Feeler(XANAX) 0.25 MG tablet TAKE 1 TABLET AT BEDTIME AS NEEDED FOR ANXIETY. 03/08/16  Yes Harrington ChallengerNancy J Young, NP  celecoxib (CELEBREX) 200 MG  capsule Take 1 capsule by mouth daily. 06/23/15  Yes Historical Provider, MD  chlorthalidone (HYGROTON) 25 MG tablet Take 25 mg by mouth daily.  04/08/16  Yes Historical Provider, MD  Estradiol-Norethindrone Acet 0.5-0.1 MG per tablet Take 1 tablet by mouth daily. 08/01/15  Yes Harrington Challenger, NP  levocetirizine (XYZAL) 5 MG tablet Take 1 tablet by mouth daily. 12/07/14  Yes Historical Provider, MD  montelukast (SINGULAIR) 10 MG tablet TAKE ONE TABLET AT BEDTIME. 04/22/14  Yes Ethelda Chick, MD  NEXIUM 40 MG capsule Take 1 capsule by mouth daily. 12/07/14  Yes Historical Provider, MD  oxyCODONE-acetaminophen (PERCOCET) 10-325 MG tablet TAKE 1/2 TO 1 TABLET BY MOUTH EVERY 8-12 HOURS AS NEEDED FOR  PAIN 04/23/16  Yes Historical Provider, MD  venlafaxine (EFFEXOR) 37.5 MG tablet Take 112.5 mg by mouth daily.   Yes Historical Provider, MD  metoCLOPramide (REGLAN) 10 MG tablet Take 1 tablet (10 mg total) by mouth every 6 (six) hours as needed for nausea (nausea/headache). Patient not taking: Reported on 04/30/2016 12/29/14   Gilda Crease, MD   BP 176/95 mmHg  Pulse 100  Temp(Src) 98.9 F (37.2 C) (Oral)  Resp 20  SpO2 100% Physical Exam  Constitutional: She is oriented to person, place, and time. She appears well-developed and well-nourished.  HENT:  Head: Normocephalic and atraumatic.  Mouth/Throat: Oropharynx is clear and moist.  Eyes: Pupils are equal, round, and reactive to light.  Neck: Normal range of motion. Neck supple.  Cardiovascular: Normal rate, regular rhythm and intact distal pulses.   Pulmonary/Chest: Effort normal and breath sounds normal. No stridor. She has no wheezes. She has no rales.  Lungs clear  Abdominal: Soft. She exhibits no mass. There is no tenderness. There is no rebound, no guarding, no tenderness at McBurney's point and negative Murphy's sign.  Hyperactive bowel sounds  Musculoskeletal: Normal range of motion.  Neurological: She is alert and oriented to person, place, and time. She has normal reflexes.  Skin: Skin is warm and dry.  Psychiatric: She has a normal mood and affect.  Nursing note and vitals reviewed.   ED Course  Procedures (including critical care time) DIAGNOSTIC STUDIES: Oxygen Saturation is 100% on RA, normal by my interpretation.    COORDINATION OF CARE: 12:30 AM- Pt advised of plan for treatment and pt agrees. Asked pt's permission for mother to be present and pt requested that her mother stay before pt was informed of lab results. Educated pt on the link between marijuana use and cyclic vomiting and the new treatment recommendation (Scribe was present during conversation)  Labs Review Labs Reviewed  COMPREHENSIVE  METABOLIC PANEL - Abnormal; Notable for the following:    Potassium 2.8 (*)    Chloride 97 (*)    Glucose, Bld 173 (*)    Anion gap 16 (*)    All other components within normal limits  CBC - Abnormal; Notable for the following:    WBC 16.0 (*)    RBC 3.66 (*)    Hemoglobin 10.7 (*)    HCT 29.5 (*)    MCHC 36.3 (*)    Platelets 685 (*)    All other components within normal limits  URINALYSIS, ROUTINE W REFLEX MICROSCOPIC (NOT AT [facility]) - Abnormal; Notable for the following:    Hgb urine dipstick SMALL (*)    Ketones, ur 40 (*)    Protein, ur 30 (*)    All other components within normal limits  URINE RAPID DRUG SCREEN, HOSP  PERFORMED - Abnormal; Notable for the following:    Benzodiazepines POSITIVE (*)    Tetrahydrocannabinol POSITIVE (*)    All other components within normal limits  URINE MICROSCOPIC-ADD ON - Abnormal; Notable for the following:    Squamous Epithelial / LPF 0-5 (*)    Bacteria, UA RARE (*)    Casts HYALINE CASTS (*)    All other components within normal limits  LIPASE, BLOOD    Imaging Review No results found.   Raelee Rossmann, MD has personally reviewed and evaluated these images and lab results as part of her medical decision-making.   EKG Interpretation None      MDM   Final diagnoses:  None    Filed Vitals:   04/30/16 2036 05/01/16 0024  BP: 176/95 166/117  Pulse: 100 86  Temp: 98.9 F (37.2 C) 98.6 F (37 C)  Resp: 20 18   Results for orders placed or performed during the hospital encounter of 04/30/16  Lipase, blood  Result Value Ref Range   Lipase 19 11 - 51 U/L  Comprehensive metabolic panel  Result Value Ref Range   Sodium 135 135 - 145 mmol/L   Potassium 2.8 (L) 3.5 - 5.1 mmol/L   Chloride 97 (L) 101 - 111 mmol/L   CO2 22 22 - 32 mmol/L   Glucose, Bld 173 (H) 65 - 99 mg/dL   BUN 8 6 - 20 mg/dL   Creatinine, Ser 4.09 0.44 - 1.00 mg/dL   Calcium 9.9 8.9 - 81.1 mg/dL   Total Protein 7.9 6.5 - 8.1 g/dL   Albumin 4.8 3.5 - 5.0  g/dL   AST 27 15 - 41 U/L   ALT 20 14 - 54 U/L   Alkaline Phosphatase 75 38 - 126 U/L   Total Bilirubin 0.7 0.3 - 1.2 mg/dL   GFR calc non Af Amer >60 >60 mL/min   GFR calc Af Amer >60 >60 mL/min   Anion gap 16 (H) 5 - 15  CBC  Result Value Ref Range   WBC 16.0 (H) 4.0 - 10.5 K/uL   RBC 3.66 (L) 3.87 - 5.11 MIL/uL   Hemoglobin 10.7 (L) 12.0 - 15.0 g/dL   HCT 91.4 (L) 78.2 - 95.6 %   MCV 80.6 78.0 - 100.0 fL   MCH 29.2 26.0 - 34.0 pg   MCHC 36.3 (H) 30.0 - 36.0 g/dL   RDW 21.3 08.6 - 57.8 %   Platelets 685 (H) 150 - 400 K/uL  Urinalysis, Routine w reflex microscopic  Result Value Ref Range   Color, Urine YELLOW YELLOW   APPearance CLEAR CLEAR   Specific Gravity, Urine 1.019 1.005 - 1.030   pH 7.5 5.0 - 8.0   Glucose, UA NEGATIVE NEGATIVE mg/dL   Hgb urine dipstick SMALL (A) NEGATIVE   Bilirubin Urine NEGATIVE NEGATIVE   Ketones, ur 40 (A) NEGATIVE mg/dL   Protein, ur 30 (A) NEGATIVE mg/dL   Nitrite NEGATIVE NEGATIVE   Leukocytes, UA NEGATIVE NEGATIVE  Rapid urine drug screen (hospital performed)  Result Value Ref Range   Opiates NONE DETECTED NONE DETECTED   Cocaine NONE DETECTED NONE DETECTED   Benzodiazepines POSITIVE (A) NONE DETECTED   Amphetamines NONE DETECTED NONE DETECTED   Tetrahydrocannabinol POSITIVE (A) NONE DETECTED   Barbiturates NONE DETECTED NONE DETECTED  Urine microscopic-add on  Result Value Ref Range   Squamous Epithelial / LPF 0-5 (A) NONE SEEN   WBC, UA 0-5 0 - 5 WBC/hpf   RBC / HPF 0-5 0 -  5 RBC/hpf   Bacteria, UA RARE (A) NONE SEEN   Casts HYALINE CASTS (A) NEGATIVE   No results found.  Medications  ondansetron (ZOFRAN) injection 4 mg (4 mg Intravenous Given 04/30/16 2115)  sodium chloride 0.9 % bolus 1,000 mL (0 mLs Intravenous Stopped 05/01/16 0130)  sodium chloride 0.9 % bolus 1,000 mL (1,000 mLs Intravenous New Bag/Given 05/01/16 0130)  dicyclomine (BENTYL) injection 20 mg (20 mg Intramuscular Given 05/01/16 0007)  ketorolac (TORADOL) 30  MG/ML injection 30 mg (30 mg Intravenous Given 05/01/16 0007)  haloperidol lactate (HALDOL) injection 2 mg (2 mg Intravenous Given 05/01/16 0008)  haloperidol lactate (HALDOL) injection 3 mg (3 mg Intravenous Given 05/01/16 0133)    With scribe present as a chaperone.  EDP asked if the patient would like her mother to step out while we discussed her lab results and the cause of the repeat vomiting and pain that the patient reports "only responds to Dilaudid".  Patient states that she wanted her mother to stay.  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  Patient then stated "they have checked my CT scans and this has not affected my digestion."  EDP stated we have very good studies to support this and that it is in the patient's best interest not to use marijuana and that Dilaudid is not the cure for her symptoms.  She will be given the appropriate medication to treat the cause of her cyclic vomiting.  Patient is sleeping soundly post medication with no further episodes of emesis nor pain.  She is stable for discharge and follow up with her PMD.  Strict return precautions given  I personally performed the services described in this documentation, which was scribed in my presence. The recorded information has been reviewed and is accurate.     Cy Blamer, MD 05/01/16 1610  Cy Blamer, MD 05/01/16 [phone]

## 2016-05-01 ENCOUNTER — Encounter (HOSPITAL_COMMUNITY): Payer: Self-pay | Admitting: Emergency Medicine

## 2016-05-01 MED ORDER — HALOPERIDOL LACTATE 5 MG/ML IJ SOLN
3.0000 mg | Freq: Once | INTRAMUSCULAR | Status: AC
  Administered 2016-05-01: 3 mg via INTRAVENOUS
  Filled 2016-05-01: qty 1

## 2016-05-01 MED ORDER — METOCLOPRAMIDE HCL 10 MG PO TABS: 10.0000 mg | ORAL_TABLET | Freq: Four times a day (QID) | ORAL | Status: DC | PRN

## 2016-05-01 MED ORDER — DICYCLOMINE HCL 20 MG PO TABS: 20.0000 mg | ORAL_TABLET | Freq: Two times a day (BID) | ORAL | Status: DC

## 2016-05-03 DIAGNOSIS — R1110 Vomiting, unspecified: Secondary | ICD-10-CM | POA: Diagnosis not present

## 2016-05-08 ENCOUNTER — Other Ambulatory Visit: Payer: Self-pay | Admitting: Women's Health

## 2016-05-08 ENCOUNTER — Other Ambulatory Visit: Payer: Self-pay | Admitting: Gastroenterology

## 2016-05-08 DIAGNOSIS — G43A0 Cyclical vomiting, not intractable: Secondary | ICD-10-CM | POA: Diagnosis not present

## 2016-05-08 DIAGNOSIS — E876 Hypokalemia: Secondary | ICD-10-CM | POA: Diagnosis not present

## 2016-05-08 DIAGNOSIS — D72829 Elevated white blood cell count, unspecified: Secondary | ICD-10-CM | POA: Diagnosis not present

## 2016-05-08 DIAGNOSIS — R7301 Impaired fasting glucose: Secondary | ICD-10-CM | POA: Diagnosis not present

## 2016-05-08 NOTE — Telephone Encounter (Signed)
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 Reviewed addictive properties not to be used daily. Will try to cut back, prescription called in.

## 2016-05-09 ENCOUNTER — Encounter (HOSPITAL_COMMUNITY): Payer: Self-pay | Admitting: *Deleted

## 2016-05-09 NOTE — Telephone Encounter (Signed)
Called into pharmacy

## 2016-05-12 NOTE — Anesthesia Preprocedure Evaluation (Addendum)
Anesthesia Evaluation  Patient identified by MRN, date of birth, ID band Patient awake    Reviewed: Allergy & Precautions, NPO status , Patient's Chart, lab work & pertinent test results  History of Anesthesia Complications (+) PONV and history of anesthetic complications  Airway Mallampati: II  TM Distance: >3 FB Neck ROM: Limited    Dental  (+) Teeth Intact, Dental Advisory Given   Pulmonary asthma , Current Smoker,     + wheezing      Cardiovascular hypertension, Pt. on medications Normal cardiovascular exam Rhythm:Regular Rate:Normal     Neuro/Psych  Headaches, PSYCHIATRIC DISORDERS Depression S/p ACDF    GI/Hepatic Neg liver ROS, GERD  Medicated,Cyclic vomiting syndrome--last episode 2 weeks ago Denies nausea/vomiting today   Endo/Other  Obesity   Renal/GU negative Renal ROS     Musculoskeletal  (+) Arthritis  (Cervical DDD), Osteoarthritis,    Abdominal   Peds  Hematology  (+) Blood dyscrasia, anemia ,   Anesthesia Other Findings Day of surgery medications reviewed with the patient.  Reproductive/Obstetrics                          Anesthesia Physical Anesthesia Plan  ASA: II  Anesthesia Plan: MAC   Post-op Pain Management:    Induction: Intravenous  Airway Management Planned: Nasal Cannula  Additional Equipment:   Intra-op Plan:   Post-operative Plan:   Informed Consent: I have reviewed the patients History and Physical, chart, labs and discussed the procedure including the risks, benefits and alternatives for the proposed anesthesia with the patient or authorized representative who has indicated his/her understanding and acceptance.   Dental advisory given  Plan Discussed with: CRNA and Anesthesiologist  Anesthesia Plan Comments: (Discussed risks/benefits/alternatives to MAC sedation including need for ventilatory support, hypotension, need for conversion to  general anesthesia.  All patient questions answered.  Patient/guardian wishes to proceed.)        Anesthesia Quick Evaluation

## 2016-05-13 ENCOUNTER — Ambulatory Visit (HOSPITAL_COMMUNITY)
Admission: RE | Admit: 2016-05-13 | Discharge: 2016-05-13 | Disposition: A | Discharge status: Home / Self Care | Payer: BLUE CROSS/BLUE SHIELD | Source: Ambulatory Visit | Attending: Gastroenterology | Admitting: Gastroenterology

## 2016-05-13 ENCOUNTER — Ambulatory Visit (HOSPITAL_COMMUNITY): Payer: BLUE CROSS/BLUE SHIELD | Admitting: Anesthesiology

## 2016-05-13 ENCOUNTER — Encounter (HOSPITAL_COMMUNITY)
Admission: RE | Disposition: A | Discharge status: Home / Self Care | Payer: Self-pay | Source: Ambulatory Visit | Attending: Gastroenterology

## 2016-05-13 ENCOUNTER — Encounter (HOSPITAL_COMMUNITY): Payer: Self-pay

## 2016-05-13 DIAGNOSIS — I10 Essential (primary) hypertension: Secondary | ICD-10-CM | POA: Diagnosis not present

## 2016-05-13 DIAGNOSIS — F329 Major depressive disorder, single episode, unspecified: Secondary | ICD-10-CM | POA: Diagnosis not present

## 2016-05-13 DIAGNOSIS — E669 Obesity, unspecified: Secondary | ICD-10-CM | POA: Diagnosis not present

## 2016-05-13 DIAGNOSIS — Z6828 Body mass index (BMI) 28.0-28.9, adult: Secondary | ICD-10-CM | POA: Insufficient documentation

## 2016-05-13 DIAGNOSIS — Z79899 Other long term (current) drug therapy: Secondary | ICD-10-CM | POA: Diagnosis not present

## 2016-05-13 DIAGNOSIS — K219 Gastro-esophageal reflux disease without esophagitis: Secondary | ICD-10-CM | POA: Diagnosis not present

## 2016-05-13 DIAGNOSIS — J45909 Unspecified asthma, uncomplicated: Secondary | ICD-10-CM | POA: Insufficient documentation

## 2016-05-13 DIAGNOSIS — F17200 Nicotine dependence, unspecified, uncomplicated: Secondary | ICD-10-CM | POA: Insufficient documentation

## 2016-05-13 DIAGNOSIS — R112 Nausea with vomiting, unspecified: Secondary | ICD-10-CM | POA: Diagnosis not present

## 2016-05-13 DIAGNOSIS — G43A1 Cyclical vomiting, intractable: Secondary | ICD-10-CM | POA: Diagnosis not present

## 2016-05-13 DIAGNOSIS — G43A0 Cyclical vomiting, not intractable: Secondary | ICD-10-CM | POA: Diagnosis not present

## 2016-05-13 HISTORY — DX: Essential (primary) hypertension: I10

## 2016-05-13 HISTORY — DX: Nausea with vomiting, unspecified: R11.2

## 2016-05-13 HISTORY — DX: Other specified postprocedural states: Z98.890

## 2016-05-13 HISTORY — PX: ESOPHAGOGASTRODUODENOSCOPY (EGD) WITH PROPOFOL: SHX5813

## 2016-05-13 SURGERY — ESOPHAGOGASTRODUODENOSCOPY (EGD) WITH PROPOFOL
Anesthesia: Monitor Anesthesia Care

## 2016-05-13 MED ORDER — PROMETHAZINE HCL 25 MG/ML IJ SOLN: 6.2500 mg | INTRAMUSCULAR | Status: DC | PRN

## 2016-05-13 MED ORDER — PROPOFOL 10 MG/ML IV BOLUS
INTRAVENOUS | Status: AC
  Filled 2016-05-13: qty 40

## 2016-05-13 MED ORDER — LIDOCAINE HCL (CARDIAC) 20 MG/ML IV SOLN
INTRAVENOUS | Status: AC
Start: 2016-05-13 — End: 2016-05-13
  Filled 2016-05-13: qty 5

## 2016-05-13 MED ORDER — PROPOFOL 500 MG/50ML IV EMUL
INTRAVENOUS | Status: DC | PRN
  Administered 2016-05-13: 110 ug/kg/min via INTRAVENOUS

## 2016-05-13 MED ORDER — LACTATED RINGERS IV SOLN: INTRAVENOUS | Status: DC

## 2016-05-13 MED ORDER — SODIUM CHLORIDE 0.9 % IV SOLN: INTRAVENOUS | Status: DC

## 2016-05-13 MED ORDER — SODIUM CHLORIDE 0.9 % IJ SOLN
INTRAMUSCULAR | Status: AC
  Filled 2016-05-13: qty 10

## 2016-05-13 MED ORDER — PROPOFOL 10 MG/ML IV BOLUS
INTRAVENOUS | Status: DC | PRN
  Administered 2016-05-13: 20 mg via INTRAVENOUS
  Administered 2016-05-13 (×2): 10 mg via INTRAVENOUS
  Administered 2016-05-13: 20 mg via INTRAVENOUS

## 2016-05-13 MED ORDER — LACTATED RINGERS IV SOLN
INTRAVENOUS | Status: DC
  Administered 2016-05-13: 07:00:00 via INTRAVENOUS

## 2016-05-13 MED ORDER — EPHEDRINE SULFATE 50 MG/ML IJ SOLN
INTRAMUSCULAR | Status: AC
Start: 2016-05-13 — End: 2016-05-13
  Filled 2016-05-13: qty 1

## 2016-05-13 MED ORDER — FENTANYL CITRATE (PF) 100 MCG/2ML IJ SOLN: 25.0000 ug | INTRAMUSCULAR | Status: DC | PRN

## 2016-05-13 MED ORDER — LIDOCAINE HCL (CARDIAC) 20 MG/ML IV SOLN
INTRAVENOUS | Status: DC | PRN
  Administered 2016-05-13: 100 mg via INTRAVENOUS

## 2016-05-13 SURGICAL SUPPLY — 15 items

## 2016-05-13 NOTE — H&P (Signed)
  Procedure: Diagnostic esophagogastroduodenoscopy with screen for H. pylori gastritis to evaluate cyclic vomiting syndrome associated with migraine syndrome. 01/14/2012 normal screening colonoscopy was performed. 03/21/2012 normal barium esophagram with tablet was performed. 01/20/2014 normal barium upper GI x-ray series was performed.  History: The patient is a 55 year old female born October 19, 1961. 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 She does not have and oral. She reports no gastrointestinal bleeding.  She is scheduled to undergo a diagnostic esophagogastroduodenoscopy with screen for H. pylori gastritis.  Past medical history: Asthmatic bronchitis. Migraine headache syndrome. Allergic rhinitis. Osteoarthritis. Cervical and lumbar disc disease. Tonsillectomy. Sinus surgery. Hypertension  Exam: Patient is alert and lying comfortably on the endoscopy stretcher. Abdomen is soft and nontender to palpation. Lungs are clear to auscultation. Cardiac exam reveals a regular rhythm.  Plan: Proceed with diagnostic esophagogastroduodenoscopy.

## 2016-05-13 NOTE — Op Note (Signed)
[facility] Patient Name: Katie Chang Procedure Date: 05/13/2016 MRN: [id number] Attending MD: Charolett Bumpers , MD Date of Birth: 08-03-1961 CSN: [id number] Age: 55 Admit Type: Outpatient Procedure:                Upper GI endoscopy Indications:              Cyclic vomiting syndrome associated with migraine                            syndrome. 02/11/2012 normal screening colonoscopy                            was perfortmed. 01/20/2014 normal barium UGI xray was                            performed. Providers:                Charolett Bumpers, MD, Omelia Blackwater, RN, Clearnce Sorrel, Technician Referring MD:              Medicines:                Propofol per Anesthesia Complications:            No immediate complications. Estimated Blood Loss:     Estimated blood loss: none. Procedure:                Pre-Anesthesia Assessment:                           - Prior to the procedure, a History and Physical                            was performed, and patient medications and                            allergies were reviewed. The patient's tolerance of                            previous anesthesia was also reviewed. The risks                            and benefits of the procedure and the sedation                            options and risks were discussed with the patient.                            All questions were answered, and informed consent                            was obtained. Prior Anticoagulants: The patient has                            taken no previous  anticoagulant or antiplatelet                            agents. ASA Grade Assessment: II - A patient with                            mild systemic disease. After reviewing the risks                            and benefits, the patient was deemed in                            satisfactory condition to undergo the procedure.                           After obtaining informed  consent, the endoscope was                            passed under direct vision. Throughout the                            procedure, the patient's blood pressure, pulse, and                            oxygen saturations were monitored continuously. The                            EG-[id number] ([id number] scope was introduced through the                            mouth, and advanced to the second part of duodenum.                            The upper GI endoscopy was accomplished without                            difficulty. The patient tolerated the procedure                            well. Scope In: Scope Out: Findings:      The Z-line was regular and was found 36 cm from the incisors.      The examined esophagus was normal.      The entire examined stomach was normal. Biopsies were taken with a cold       forceps for histology.      The examined duodenum was normal. Impression:               - Z-line regular, 36 cm from the incisors.                           - Normal esophagus.                           - Normal stomach. Biopsied.                           -  Normal examined duodenum. Moderate Sedation:      N/A- Per Anesthesia Care Recommendation:           - Patient has a contact number available for                            emergencies. The signs and symptoms of potential                            delayed complications were discussed with the                            patient. Return to normal activities tomorrow.                            Written discharge instructions were provided to the                            patient.                           - Await pathology results.                           - Resume previous diet.                           - Continue present medications. Procedure Code(s):        --- Professional ---                           [phone], Esophagogastroduodenoscopy, flexible,                            transoral; with biopsy, single or  multiple Diagnosis Code(s):        --- Professional ---                           R11.2, Nausea with vomiting, unspecified CPT copyright 2016 [employer]. All rights reserved. The codes documented in this report are preliminary and upon coder review may  be revised to meet current compliance requirements. Danise EdgeMartin Johnson, MD Charolett BumpersMartin K Johnson, MD 05/13/2016 8:00:10 AM This report has been signed electronically. Number of Addenda: 0

## 2016-05-13 NOTE — Transfer of Care (Signed)
Immediate Anesthesia Transfer of Care Note  Patient: Katie Chang  Procedure(s) Performed: Procedure(s): ESOPHAGOGASTRODUODENOSCOPY (EGD) WITH PROPOFOL (N/A)  Patient Location: PACU  Anesthesia Type:MAC  Level of Consciousness: awake, alert , oriented and patient cooperative  Airway & Oxygen Therapy: Patient Spontanous Breathing and Patient connected to nasal cannula oxygen  Post-op Assessment: Report given to RN and Post -op Vital signs reviewed and stable  Post vital signs: Reviewed and stable  Last Vitals:  Filed Vitals:   05/13/16 0635  BP: 123/79  Pulse: 80  Temp: 36.7 C  Resp: 17    Last Pain: There were no vitals filed for this visit.       Complications: No apparent anesthesia complications

## 2016-05-13 NOTE — Anesthesia Postprocedure Evaluation (Signed)
Anesthesia Post Note  Patient: Katie Chang  Procedure(s) Performed: Procedure(s) (LRB): ESOPHAGOGASTRODUODENOSCOPY (EGD) WITH PROPOFOL (N/A)  Patient location during evaluation: Endoscopy Anesthesia Type: MAC Level of consciousness: awake and alert Pain management: pain level controlled Vital Signs Assessment: post-procedure vital signs reviewed and stable Respiratory status: spontaneous breathing, nonlabored ventilation, respiratory function stable and patient connected to nasal cannula oxygen Cardiovascular status: stable and blood pressure returned to baseline Anesthetic complications: no    Last Vitals:  Filed Vitals:   05/13/16 0810 05/13/16 0820  BP: 163/87 159/91  Pulse: 89   Temp:    Resp: 16     Last Pain:  Filed Vitals:   05/13/16 0822  PainSc: 0-No pain                 Cecile HearingStephen Edward Keylor Rands

## 2016-05-14 ENCOUNTER — Encounter (HOSPITAL_COMMUNITY): Payer: Self-pay | Admitting: Gastroenterology

## 2016-05-14 DIAGNOSIS — F4323 Adjustment disorder with mixed anxiety and depressed mood: Secondary | ICD-10-CM | POA: Diagnosis not present

## 2016-05-14 DIAGNOSIS — M25562 Pain in left knee: Secondary | ICD-10-CM | POA: Diagnosis not present

## 2016-05-22 DIAGNOSIS — M1712 Unilateral primary osteoarthritis, left knee: Secondary | ICD-10-CM | POA: Diagnosis not present

## 2016-05-23 ENCOUNTER — Other Ambulatory Visit: Payer: Self-pay | Admitting: Orthopedic Surgery

## 2016-05-23 DIAGNOSIS — M7122 Synovial cyst of popliteal space [Baker], left knee: Secondary | ICD-10-CM

## 2016-05-27 DIAGNOSIS — H5213 Myopia, bilateral: Secondary | ICD-10-CM | POA: Diagnosis not present

## 2016-05-28 ENCOUNTER — Ambulatory Visit
Admission: RE | Admit: 2016-05-28 | Discharge: 2016-05-28 | Disposition: A | Discharge status: Home / Self Care | Payer: BLUE CROSS/BLUE SHIELD | Source: Ambulatory Visit | Attending: Orthopedic Surgery | Admitting: Orthopedic Surgery

## 2016-05-28 DIAGNOSIS — M7122 Synovial cyst of popliteal space [Baker], left knee: Secondary | ICD-10-CM

## 2016-05-29 DIAGNOSIS — M5020 Other cervical disc displacement, unspecified cervical region: Secondary | ICD-10-CM | POA: Diagnosis not present

## 2016-05-29 DIAGNOSIS — F4323 Adjustment disorder with mixed anxiety and depressed mood: Secondary | ICD-10-CM | POA: Diagnosis not present

## 2016-07-03 ENCOUNTER — Other Ambulatory Visit: Payer: Self-pay | Admitting: Sports Medicine

## 2016-07-03 DIAGNOSIS — M5442 Lumbago with sciatica, left side: Secondary | ICD-10-CM | POA: Diagnosis not present

## 2016-07-03 DIAGNOSIS — M545 Low back pain: Secondary | ICD-10-CM

## 2016-07-03 DIAGNOSIS — Z23 Encounter for immunization: Secondary | ICD-10-CM | POA: Diagnosis not present

## 2016-07-09 ENCOUNTER — Other Ambulatory Visit: Payer: Self-pay | Admitting: Women's Health

## 2016-07-09 NOTE — Telephone Encounter (Signed)
Okay for refill, did she just have refilled last month? Remind her to take sparingly not daily, addictive drugs.

## 2016-07-10 DIAGNOSIS — M94262 Chondromalacia, left knee: Secondary | ICD-10-CM | POA: Diagnosis not present

## 2016-07-10 DIAGNOSIS — M1712 Unilateral primary osteoarthritis, left knee: Secondary | ICD-10-CM | POA: Diagnosis not present

## 2016-07-10 NOTE — Telephone Encounter (Signed)
On her med list she last received it on 05/08/2016.

## 2016-07-10 NOTE — Telephone Encounter (Signed)
Rx called in to pharmacy. 

## 2016-07-11 ENCOUNTER — Other Ambulatory Visit: Payer: BLUE CROSS/BLUE SHIELD

## 2016-07-11 DIAGNOSIS — M1712 Unilateral primary osteoarthritis, left knee: Secondary | ICD-10-CM | POA: Diagnosis not present

## 2016-07-11 DIAGNOSIS — S83289A Other tear of lateral meniscus, current injury, unspecified knee, initial encounter: Secondary | ICD-10-CM | POA: Diagnosis not present

## 2016-07-12 DIAGNOSIS — M25662 Stiffness of left knee, not elsewhere classified: Secondary | ICD-10-CM | POA: Diagnosis not present

## 2016-07-12 DIAGNOSIS — R262 Difficulty in walking, not elsewhere classified: Secondary | ICD-10-CM | POA: Diagnosis not present

## 2016-07-12 DIAGNOSIS — M25562 Pain in left knee: Secondary | ICD-10-CM | POA: Diagnosis not present

## 2016-07-12 DIAGNOSIS — M25462 Effusion, left knee: Secondary | ICD-10-CM | POA: Diagnosis not present

## 2016-07-16 DIAGNOSIS — R262 Difficulty in walking, not elsewhere classified: Secondary | ICD-10-CM | POA: Diagnosis not present

## 2016-07-16 DIAGNOSIS — M25662 Stiffness of left knee, not elsewhere classified: Secondary | ICD-10-CM | POA: Diagnosis not present

## 2016-07-16 DIAGNOSIS — M25462 Effusion, left knee: Secondary | ICD-10-CM | POA: Diagnosis not present

## 2016-07-16 DIAGNOSIS — M25562 Pain in left knee: Secondary | ICD-10-CM | POA: Diagnosis not present

## 2016-07-18 DIAGNOSIS — M25462 Effusion, left knee: Secondary | ICD-10-CM | POA: Diagnosis not present

## 2016-07-19 DIAGNOSIS — M25562 Pain in left knee: Secondary | ICD-10-CM | POA: Diagnosis not present

## 2016-07-19 DIAGNOSIS — M25462 Effusion, left knee: Secondary | ICD-10-CM | POA: Diagnosis not present

## 2016-07-19 DIAGNOSIS — M25662 Stiffness of left knee, not elsewhere classified: Secondary | ICD-10-CM | POA: Diagnosis not present

## 2016-07-19 DIAGNOSIS — R262 Difficulty in walking, not elsewhere classified: Secondary | ICD-10-CM | POA: Diagnosis not present

## 2016-07-24 DIAGNOSIS — M25662 Stiffness of left knee, not elsewhere classified: Secondary | ICD-10-CM | POA: Diagnosis not present

## 2016-07-24 DIAGNOSIS — R262 Difficulty in walking, not elsewhere classified: Secondary | ICD-10-CM | POA: Diagnosis not present

## 2016-07-24 DIAGNOSIS — M25462 Effusion, left knee: Secondary | ICD-10-CM | POA: Diagnosis not present

## 2016-07-24 DIAGNOSIS — M25562 Pain in left knee: Secondary | ICD-10-CM | POA: Diagnosis not present

## 2016-07-25 DIAGNOSIS — M25462 Effusion, left knee: Secondary | ICD-10-CM | POA: Diagnosis not present

## 2016-07-29 DIAGNOSIS — M25662 Stiffness of left knee, not elsewhere classified: Secondary | ICD-10-CM | POA: Diagnosis not present

## 2016-07-29 DIAGNOSIS — M25562 Pain in left knee: Secondary | ICD-10-CM | POA: Diagnosis not present

## 2016-07-29 DIAGNOSIS — R262 Difficulty in walking, not elsewhere classified: Secondary | ICD-10-CM | POA: Diagnosis not present

## 2016-07-29 DIAGNOSIS — M25462 Effusion, left knee: Secondary | ICD-10-CM | POA: Diagnosis not present

## 2016-07-31 DIAGNOSIS — M25662 Stiffness of left knee, not elsewhere classified: Secondary | ICD-10-CM | POA: Diagnosis not present

## 2016-07-31 DIAGNOSIS — M25562 Pain in left knee: Secondary | ICD-10-CM | POA: Diagnosis not present

## 2016-07-31 DIAGNOSIS — R262 Difficulty in walking, not elsewhere classified: Secondary | ICD-10-CM | POA: Diagnosis not present

## 2016-07-31 DIAGNOSIS — M25462 Effusion, left knee: Secondary | ICD-10-CM | POA: Diagnosis not present

## 2016-08-01 ENCOUNTER — Encounter: Payer: BLUE CROSS/BLUE SHIELD | Admitting: Women's Health

## 2016-08-07 DIAGNOSIS — M25562 Pain in left knee: Secondary | ICD-10-CM | POA: Diagnosis not present

## 2016-08-07 DIAGNOSIS — M25462 Effusion, left knee: Secondary | ICD-10-CM | POA: Diagnosis not present

## 2016-08-07 DIAGNOSIS — M25662 Stiffness of left knee, not elsewhere classified: Secondary | ICD-10-CM | POA: Diagnosis not present

## 2016-08-07 DIAGNOSIS — R262 Difficulty in walking, not elsewhere classified: Secondary | ICD-10-CM | POA: Diagnosis not present

## 2016-08-20 ENCOUNTER — Ambulatory Visit
Admission: RE | Admit: 2016-08-20 | Discharge: 2016-08-20 | Disposition: A | Discharge status: Home / Self Care | Payer: BLUE CROSS/BLUE SHIELD | Source: Ambulatory Visit | Attending: Sports Medicine | Admitting: Sports Medicine

## 2016-08-20 DIAGNOSIS — M5126 Other intervertebral disc displacement, lumbar region: Secondary | ICD-10-CM | POA: Diagnosis not present

## 2016-08-20 DIAGNOSIS — M545 Low back pain, unspecified: Secondary | ICD-10-CM

## 2016-08-21 ENCOUNTER — Other Ambulatory Visit: Payer: Self-pay | Admitting: Women's Health

## 2016-08-21 DIAGNOSIS — M545 Low back pain: Secondary | ICD-10-CM | POA: Diagnosis not present

## 2016-08-22 DIAGNOSIS — M545 Low back pain: Secondary | ICD-10-CM | POA: Diagnosis not present

## 2016-08-23 ENCOUNTER — Encounter: Payer: BLUE CROSS/BLUE SHIELD | Admitting: Women's Health

## 2016-08-29 ENCOUNTER — Telehealth: Payer: Self-pay

## 2016-08-29 ENCOUNTER — Encounter: Payer: Self-pay | Admitting: Women's Health

## 2016-08-29 ENCOUNTER — Ambulatory Visit (INDEPENDENT_AMBULATORY_CARE_PROVIDER_SITE_OTHER): Payer: BLUE CROSS/BLUE SHIELD | Admitting: Women's Health

## 2016-08-29 VITALS — BP 126/78 | Ht 65.0 in | Wt 167.0 lb

## 2016-08-29 DIAGNOSIS — Z01419 Encounter for gynecological examination (general) (routine) without abnormal findings: Secondary | ICD-10-CM

## 2016-08-29 DIAGNOSIS — Z79890 Hormone replacement therapy (postmenopausal): Secondary | ICD-10-CM | POA: Diagnosis not present

## 2016-08-29 DIAGNOSIS — Z13820 Encounter for screening for osteoporosis: Secondary | ICD-10-CM | POA: Diagnosis not present

## 2016-08-29 DIAGNOSIS — N898 Other specified noninflammatory disorders of vagina: Secondary | ICD-10-CM

## 2016-08-29 LAB — WET PREP FOR TRICH, YEAST, CLUE
Clue Cells Wet Prep HPF POC: NONE SEEN
Trich, Wet Prep: NONE SEEN
WBC, Wet Prep HPF POC: NONE SEEN
Yeast Wet Prep HPF POC: NONE SEEN

## 2016-08-29 MED ORDER — ESTRADIOL-NORETHINDRONE ACET 1-0.5 MG PO TABS: 1.0000 | ORAL_TABLET | Freq: Every day | ORAL | 4 refills | Status: DC

## 2016-08-29 NOTE — Telephone Encounter (Signed)
Pharmacy called to clarify dose on Activella that you sent in today.  Previous dose was 0.5-0.1 mg tablet. Patient told pharmacist she thought she was supposed to be weaning off of it. Pharmacist was just confused about the increased dose and wanted to clarify.

## 2016-08-29 NOTE — Patient Instructions (Addendum)
Menopause is a normal process in which your reproductive ability comes to an end. This process happens gradually over a span of months to years, usually between the ages of 48 and 55. Menopause is complete when you have missed 12 consecutive menstrual periods. It is important to talk with your health care provider about some of the most common conditions that affect postmenopausal women, such as heart disease, cancer, and bone loss (osteoporosis). Adopting a healthy lifestyle and getting preventive care can help to promote your health and wellness. Those actions can also lower your chances of developing some of these common conditions. WHAT SHOULD I KNOW ABOUT MENOPAUSE? During menopause, you may experience a number of symptoms, such as:  Moderate-to-severe hot flashes.  Night sweats.  Decrease in sex drive.  Mood swings.  Headaches.  Tiredness.  Irritability.  Memory problems.  Insomnia. Choosing to treat or not to treat menopausal changes is an individual decision that you make with your health care provider. WHAT SHOULD I KNOW ABOUT HORMONE REPLACEMENT THERAPY AND SUPPLEMENTS? Hormone therapy products are effective for treating symptoms that are associated with menopause, such as hot flashes and night sweats. Hormone replacement carries certain risks, especially as you become older. If you are thinking about using estrogen or estrogen with progestin treatments, discuss the benefits and risks with your health care provider. WHAT SHOULD I KNOW ABOUT HEART DISEASE AND STROKE? Heart disease, heart attack, and stroke become more likely as you age. This may be due, in part, to the hormonal changes that your body experiences during menopause. These can affect how your body processes dietary fats, triglycerides, and cholesterol. Heart attack and stroke are both medical emergencies. There are many things that you can do to help prevent heart disease and stroke:  Have your blood pressure  checked at least every 1-2 years. High blood pressure causes heart disease and increases the risk of stroke.  If you are 55-79 years old, ask your health care provider if you should take aspirin to prevent a heart attack or a stroke.  Do not use any tobacco products, including cigarettes, chewing tobacco, or electronic cigarettes. If you need help quitting, ask your health care provider.  It is important to eat a healthy diet and maintain a healthy weight.  Be sure to include plenty of vegetables, fruits, low-fat dairy products, and lean protein.  Avoid eating foods that are high in solid fats, added sugars, or salt (sodium).  Get regular exercise. This is one of the most important things that you can do for your health.  Try to exercise for at least 150 minutes each week. The type of exercise that you do should increase your heart rate and make you sweat. This is known as moderate-intensity exercise.  Try to do strengthening exercises at least twice each week. Do these in addition to the moderate-intensity exercise.  Know your numbers.Ask your health care provider to check your cholesterol and your blood glucose. Continue to have your blood tested as directed by your health care provider. WHAT SHOULD I KNOW ABOUT CANCER SCREENING? There are several types of cancer. Take the following steps to reduce your risk and to catch any cancer development as early as possible. Breast Cancer  Practice breast self-awareness.  This means understanding how your breasts normally appear and feel.  It also means doing regular breast self-exams. Let your health care provider know about any changes, no matter how small.  If you are 40 or older, have a clinician do a   breast exam (clinical breast exam or CBE) every year. Depending on your age, family history, and medical history, it may be recommended that you also have a yearly breast X-ray (mammogram).  If you have a family history of breast cancer,  talk with your health care provider about genetic screening.  If you are at high risk for breast cancer, talk with your health care provider about having an MRI and a mammogram every year.  Breast cancer (BRCA) gene test is recommended for women who have family members with BRCA-related cancers. Results of the assessment will determine the need for genetic counseling and BRCA1 and for BRCA2 testing. BRCA-related cancers include these types:  Breast. This occurs in males or females.  Ovarian.  Tubal. This may also be called fallopian tube cancer.  Cancer of the abdominal or pelvic lining (peritoneal cancer).  Prostate.  Pancreatic. Cervical, Uterine, and Ovarian Cancer Your health care provider may recommend that you be screened regularly for cancer of the pelvic organs. These include your ovaries, uterus, and vagina. This screening involves a pelvic exam, which includes checking for microscopic changes to the surface of your cervix (Pap test).  For women ages 21-65, health care providers may recommend a pelvic exam and a Pap test every three years. For women ages 77-65, they may recommend the Pap test and pelvic exam, combined with testing for human papilloma virus (HPV), every five years. Some types of HPV increase your risk of cervical cancer. Testing for HPV may also be done on women of any age who have unclear Pap test results.  Other health care providers may not recommend any screening for nonpregnant women who are considered low risk for pelvic cancer and have no symptoms. Ask your health care provider if a screening pelvic exam is right for you.  If you have had past treatment for cervical cancer or a condition that could lead to cancer, you need Pap tests and screening for cancer for at least 20 years after your treatment. If Pap tests have been discontinued for you, your risk factors (such as having a new sexual partner) need to be reassessed to determine if you should start having  screenings again. Some women have medical problems that increase the chance of getting cervical cancer. In these cases, your health care provider may recommend that you have screening and Pap tests more often.  If you have a family history of uterine cancer or ovarian cancer, talk with your health care provider about genetic screening.  If you have vaginal bleeding after reaching menopause, tell your health care provider.  There are currently no reliable tests available to screen for ovarian cancer. Lung Cancer Lung cancer screening is recommended for adults 3-70 years old who are at high risk for lung cancer because of a history of smoking. A yearly low-dose CT scan of the lungs is recommended if you:  Currently smoke.  Have a history of at least 30 pack-years of smoking and you currently smoke or have quit within the past 15 years. A pack-year is smoking an average of one pack of cigarettes per day for one year. Yearly screening should:  Continue until it has been 15 years since you quit.  Stop if you develop a health problem that would prevent you from having lung cancer treatment. Colorectal Cancer  This type of cancer can be detected and can often be prevented.  Routine colorectal cancer screening usually begins at age 38 and continues through age 12.  If you have  risk factors for colon cancer, your health care provider may recommend that you be screened at an earlier age.  If you have a family history of colorectal cancer, talk with your health care provider about genetic screening.  Your health care provider may also recommend using home test kits to check for hidden blood in your stool.  A small camera at the end of a tube can be used to examine your colon directly (sigmoidoscopy or colonoscopy). This is done to check for the earliest forms of colorectal cancer.  Direct examination of the colon should be repeated every 5-10 years until age 67. However, if early forms of  precancerous polyps or small growths are found or if you have a family history or genetic risk for colorectal cancer, you may need to be screened more often. Skin Cancer  Check your skin from head to toe regularly.  Monitor any moles. Be sure to tell your health care provider:  About any new moles or changes in moles, especially if there is a change in a mole's shape or color.  If you have a mole that is larger than the size of a pencil eraser.  If any of your family members has a history of skin cancer, especially at a Carrell Palmatier age, talk with your health care provider about genetic screening.  Always use sunscreen. Apply sunscreen liberally and repeatedly throughout the day.  Whenever you are outside, protect yourself by wearing long sleeves, pants, a wide-brimmed hat, and sunglasses. WHAT SHOULD I KNOW ABOUT OSTEOPOROSIS? Osteoporosis is a condition in which bone destruction happens more quickly than new bone creation. After menopause, you may be at an increased risk for osteoporosis. To help prevent osteoporosis or the bone fractures that can happen because of osteoporosis, the following is recommended:  If you are 39-61 years old, get at least 1,000 mg of calcium and at least 600 mg of vitamin D per day.  If you are older than age 16 but younger than age 7, get at least 1,200 mg of calcium and at least 600 mg of vitamin D per day.  If you are older than age 47, get at least 1,200 mg of calcium and at least 800 mg of vitamin D per day. Smoking and excessive alcohol intake increase the risk of osteoporosis. Eat foods that are rich in calcium and vitamin D, and do weight-bearing exercises several times each week as directed by your health care provider. WHAT SHOULD I KNOW ABOUT HOW MENOPAUSE AFFECTS [place]? Depression may occur at any age, but it is more common as you become older. Common symptoms of depression include:  Low or sad mood.  Changes in sleep patterns.  Changes  in appetite or eating patterns.  Feeling an overall lack of motivation or enjoyment of activities that you previously enjoyed.  Frequent crying spells. Talk with your health care provider if you think that you are experiencing depression. WHAT SHOULD I KNOW ABOUT IMMUNIZATIONS? It is important that you get and maintain your immunizations. These include:  Tetanus, diphtheria, and pertussis (Tdap) booster vaccine.  Influenza every year before the flu season begins.  Pneumonia vaccine.  Shingles vaccine. Your health care provider may also recommend other immunizations.   This information is not intended to replace advice given to you by your health care provider. Make sure you discuss any questions you have with your health care provider.   Document Released: 12/27/2005 Document Revised: 11/25/2014 Document Reviewed: 07/07/2014 Elsevier Interactive Patient Education 2016 [employer] Smoking Cessation, Tips for Success If you are ready to quit smoking, congratulations! You have chosen to help yourself be healthier. Cigarettes bring nicotine, tar, carbon monoxide, and other irritants into your body. Your lungs, heart, and blood vessels will be able to work better without these poisons. There are many different ways to quit smoking. Nicotine gum, nicotine patches, a nicotine inhaler, or nicotine nasal spray can help with physical craving. Hypnosis, support groups, and medicines help break the habit of smoking. WHAT THINGS CAN I DO TO MAKE QUITTING EASIER?  Here are some tips to help you quit for good:  Pick a date when you will quit smoking completely. Tell all of your friends and family about your plan to quit on that date.  Do not try to slowly cut down on the number of cigarettes you are smoking. Pick a quit date and quit smoking completely starting on that day.  Throw away all cigarettes.   Clean and remove all ashtrays from your home, work, and car.  On a card, write down your  reasons for quitting. Carry the card with you and read it when you get the urge to smoke.  Cleanse your body of nicotine. Drink enough water and fluids to keep your urine clear or pale yellow. Do this after quitting to flush the nicotine from your body.  Learn to predict your moods. Do not let a bad situation be your excuse to have a cigarette. Some situations in your life might tempt you into wanting a cigarette.  Never have "just one" cigarette. It leads to wanting another and another. Remind yourself of your decision to quit.  Change habits associated with smoking. If you smoked while driving or when feeling stressed, try other activities to replace smoking. Stand up when drinking your coffee. Brush your teeth after eating. Sit in a different chair when you read the paper. Avoid alcohol while trying to quit, and try to drink fewer caffeinated beverages. Alcohol and caffeine may urge you to smoke.  Avoid foods and drinks that can trigger a desire to smoke, such as sugary or spicy foods and alcohol.  Ask people who smoke not to smoke around you.  Have something planned to do right after eating or having a cup of coffee. For example, plan to take a walk or exercise.  Try a relaxation exercise to calm you down and decrease your stress. Remember, you may be tense and nervous for the first 2 weeks after you quit, but this will pass.  Find new activities to keep your hands busy. Play with a pen, coin, or rubber band. Doodle or draw things on paper.  Brush your teeth right after eating. This will help cut down on the craving for the taste of tobacco after meals. You can also try mouthwash.   Use oral substitutes in place of cigarettes. Try using lemon drops, carrots, cinnamon sticks, or chewing gum. Keep them handy so they are available when you have the urge to smoke.  When you have the urge to smoke, try deep breathing.  Designate your home as a nonsmoking area.  If you are a heavy smoker,  ask your health care provider about a prescription for nicotine chewing gum. It can ease your withdrawal from nicotine.  Reward yourself. Set aside the cigarette money you save and buy yourself something nice.  Look for support from others. Join a support group or smoking cessation program. Ask someone at home or at work to help you with   your plan to quit smoking.  Always ask yourself, "Do I need this cigarette or is this just a reflex?" Tell yourself, "Today, I choose not to smoke," or "I do not want to smoke." You are reminding yourself of your decision to quit.  Do not replace cigarette smoking with electronic cigarettes (commonly called e-cigarettes). The safety of e-cigarettes is unknown, and some may contain harmful chemicals.  If you relapse, do not give up! Plan ahead and think about what you will do the next time you get the urge to smoke. HOW WILL I FEEL WHEN I QUIT SMOKING? You may have symptoms of withdrawal because your body is used to nicotine (the addictive substance in cigarettes). You may crave cigarettes, be irritable, feel very hungry, cough often, get headaches, or have difficulty concentrating. The withdrawal symptoms are only temporary. They are strongest when you first quit but will go away within 10-14 days. When withdrawal symptoms occur, stay in control. Think about your reasons for quitting. Remind yourself that these are signs that your body is healing and getting used to being without cigarettes. Remember that withdrawal symptoms are easier to treat than the major diseases that smoking can cause.  Even after the withdrawal is over, expect periodic urges to smoke. However, these cravings are generally short lived and will go away whether you smoke or not. Do not smoke! WHAT RESOURCES ARE AVAILABLE TO HELP ME QUIT SMOKING? Your health care provider can direct you to community resources or hospitals for support, which may include:  Group  support.  Education.  Hypnosis.  Therapy.   This information is not intended to replace advice given to you by your health care provider. Make sure you discuss any questions you have with your health care provider.   Document Released: 08/02/2004 Document Revised: 11/25/2014 Document Reviewed: 04/22/2013 Elsevier Interactive Patient Education 2016 Elsevier Inc.  

## 2016-08-29 NOTE — Telephone Encounter (Signed)
0.5/0.1 is the correct dose if that's which she was on I just renewed the one that was in the chart, sorry we are trying to wean off.

## 2016-08-29 NOTE — Progress Notes (Signed)
Katie Chang 1961/03/28 [id number]    History:    Presents for annual exam.  Postmenopausal on HRT with no bleeding. Continues with some hot flushes. Hypertension/GERD/anxiety/depression managed by primary care. 2006 CIN-1 normal  Paps after. 2013 negative colonoscopy. 2013 T score -1.2 left femoral neck FRAX 4%/0.4%. Not sexually active greater than one year. History of cyclic vomiting with migraines was seen at the ER. Knee surgery and neck surgery this past year. Smoker.  Past medical history, past surgical history, family history and social history were all reviewed and documented in the EPIC chart. Retired Airline pilotaccountant. Used to ride horses. Helps care for elderly parents.  ROS:  A ROS was performed and pertinent positives and negatives are included.  Exam:  Vitals:   08/29/16 1359  BP: 126/78  Weight: 167 lb (75.8 kg)  Height: 5\' 5"  (1.651 m)   Body mass index is 27.79 kg/m.   General appearance:  Normal Thyroid:  Symmetrical, normal in size, without palpable masses or nodularity. Respiratory  Auscultation:  Clear without wheezing or rhonchi Cardiovascular  Auscultation:  Regular rate, without rubs, murmurs or gallops  Edema/varicosities:  Not grossly evident Abdominal  Soft,nontender, without masses, guarding or rebound.  Liver/spleen:  No organomegaly noted  Hernia:  None appreciated  Skin  Inspection:  Grossly normal   Breasts: Examined lying and sitting.     Right: Without masses, retractions, discharge or axillary adenopathy.     Left: Without masses, retractions, discharge or axillary adenopathy. Gentitourinary   Inguinal/mons:  Normal without inguinal adenopathy  External genitalia:  Normal  BUS/Urethra/Skene's glands:  Normal  Vagina:  Normal  Cervix:  Normal  Uterus:   normal in size, shape and contour.  Midline and mobile  Adnexa/parametria:     Rt: Without masses or tenderness.   Lt: Without masses or tenderness.  Anus and perineum: Normal  Digital rectal  exam: Normal sphincter tone without palpated masses or tenderness  Assessment/Plan:  55 y.o. S WF G1 P0 for annual exam with no complaints.  Postmenopausal/no HRT/no bleeding Hypertension/GERD/anxiety and depression- primary care labs and meds Smoker 2006 CIN-1 normal Paps after Osteopenia without elevated FRAX  Plan: Activella 1/0.5 prescription, proper use given and reviewed instructed to try half tablet, less is best, reviewed women's health initiative. Reviewed risks of blood clots, strokes and breast cancer. SBE's, continue annual 3-D screening mammogram, calcium rich diet, vitamin D 1000 daily encouraged. Repeat DEXA, home safety, fall prevention and importance of weightbearing exercise reviewed. UA, Pap with HR HPV typing, new screening guidelines reviewed. Aware of hazards of smoking tips to quit reviewed.      Harrington ChallengerYOUNG,Artyom Stencel J [facility] Regional Medical CenterWHNP, 3:48 PM 08/29/2016

## 2016-08-30 MED ORDER — ESTRADIOL-NORETHINDRONE ACET 0.5-0.1 MG PO TABS: 1.0000 | ORAL_TABLET | Freq: Every day | ORAL | 11 refills | Status: DC

## 2016-08-30 NOTE — Telephone Encounter (Signed)
Corrected Rx sent

## 2016-08-31 LAB — PAP, TP IMAGING W/ HPV RNA, RFLX HPV TYPE 16,18/45: HPV MRNA, HIGH RISK: NOT DETECTED

## 2016-09-08 ENCOUNTER — Other Ambulatory Visit: Payer: Self-pay | Admitting: Women's Health

## 2016-09-09 DIAGNOSIS — J300 Vasomotor rhinitis: Secondary | ICD-10-CM | POA: Diagnosis not present

## 2016-09-09 DIAGNOSIS — H1045 Other chronic allergic conjunctivitis: Secondary | ICD-10-CM | POA: Diagnosis not present

## 2016-09-09 DIAGNOSIS — J4520 Mild intermittent asthma, uncomplicated: Secondary | ICD-10-CM | POA: Diagnosis not present

## 2016-09-09 DIAGNOSIS — K219 Gastro-esophageal reflux disease without esophagitis: Secondary | ICD-10-CM | POA: Diagnosis not present

## 2016-09-09 NOTE — Telephone Encounter (Signed)
Last filled on 07/09/16  With 1 refill

## 2016-09-09 NOTE — Telephone Encounter (Signed)
Rx called in 

## 2016-09-09 NOTE — Telephone Encounter (Signed)
Ok for refill? 

## 2016-09-10 ENCOUNTER — Other Ambulatory Visit: Payer: Self-pay | Admitting: Neurosurgery

## 2016-09-10 DIAGNOSIS — M5126 Other intervertebral disc displacement, lumbar region: Secondary | ICD-10-CM

## 2016-09-11 ENCOUNTER — Encounter: Payer: Self-pay | Admitting: Women's Health

## 2016-09-11 DIAGNOSIS — Z1231 Encounter for screening mammogram for malignant neoplasm of breast: Secondary | ICD-10-CM | POA: Diagnosis not present

## 2016-09-17 ENCOUNTER — Ambulatory Visit
Admission: RE | Admit: 2016-09-17 | Discharge: 2016-09-17 | Disposition: A | Discharge status: Home / Self Care | Payer: BLUE CROSS/BLUE SHIELD | Source: Ambulatory Visit | Attending: Neurosurgery | Admitting: Neurosurgery

## 2016-09-17 DIAGNOSIS — M5126 Other intervertebral disc displacement, lumbar region: Secondary | ICD-10-CM

## 2016-09-17 MED ORDER — IOPAMIDOL (ISOVUE-M 200) INJECTION 41%
1.0000 mL | Freq: Once | INTRAMUSCULAR | Status: AC
  Administered 2016-09-17: 1 mL via EPIDURAL

## 2016-09-17 MED ORDER — METHYLPREDNISOLONE ACETATE 40 MG/ML INJ SUSP (RADIOLOG
120.0000 mg | Freq: Once | INTRAMUSCULAR | Status: AC
  Administered 2016-09-17: 120 mg via EPIDURAL

## 2016-09-17 NOTE — Discharge Instructions (Signed)

## 2016-10-09 DIAGNOSIS — M5126 Other intervertebral disc displacement, lumbar region: Secondary | ICD-10-CM | POA: Diagnosis not present

## 2016-10-09 DIAGNOSIS — M47816 Spondylosis without myelopathy or radiculopathy, lumbar region: Secondary | ICD-10-CM | POA: Diagnosis not present

## 2016-10-28 DIAGNOSIS — G43719 Chronic migraine without aura, intractable, without status migrainosus: Secondary | ICD-10-CM | POA: Diagnosis not present

## 2016-10-28 DIAGNOSIS — G43019 Migraine without aura, intractable, without status migrainosus: Secondary | ICD-10-CM | POA: Diagnosis not present

## 2016-11-07 ENCOUNTER — Other Ambulatory Visit: Payer: Self-pay | Admitting: Women's Health

## 2016-11-07 NOTE — Telephone Encounter (Signed)
Rx sent 

## 2016-11-07 NOTE — Telephone Encounter (Signed)
Last filled on 09/09/16 with 1 refill

## 2016-11-07 NOTE — Telephone Encounter (Signed)
Ok for refill? 

## 2016-11-21 DIAGNOSIS — E7800 Pure hypercholesterolemia, unspecified: Secondary | ICD-10-CM | POA: Diagnosis not present

## 2016-11-21 DIAGNOSIS — G44009 Cluster headache syndrome, unspecified, not intractable: Secondary | ICD-10-CM | POA: Diagnosis not present

## 2016-11-21 DIAGNOSIS — I10 Essential (primary) hypertension: Secondary | ICD-10-CM | POA: Diagnosis not present

## 2016-11-21 DIAGNOSIS — J45909 Unspecified asthma, uncomplicated: Secondary | ICD-10-CM | POA: Diagnosis not present

## 2016-11-21 DIAGNOSIS — G43A0 Cyclical vomiting, not intractable: Secondary | ICD-10-CM | POA: Diagnosis not present

## 2016-12-03 DIAGNOSIS — M25462 Effusion, left knee: Secondary | ICD-10-CM | POA: Diagnosis not present

## 2017-01-06 ENCOUNTER — Other Ambulatory Visit: Payer: Self-pay | Admitting: Women's Health

## 2017-01-06 NOTE — Telephone Encounter (Signed)
Please call and review best not to take daily, will not help with rest after a while, addictive, best to go to bed same time and get up same time, ok for refill

## 2017-01-07 ENCOUNTER — Other Ambulatory Visit: Payer: Self-pay | Admitting: Women's Health

## 2017-01-07 NOTE — Telephone Encounter (Signed)
[facility] .

## 2017-01-23 DIAGNOSIS — M5126 Other intervertebral disc displacement, lumbar region: Secondary | ICD-10-CM | POA: Diagnosis not present

## 2017-01-23 DIAGNOSIS — M5020 Other cervical disc displacement, unspecified cervical region: Secondary | ICD-10-CM | POA: Diagnosis not present

## 2017-02-16 DIAGNOSIS — S40012A Contusion of left shoulder, initial encounter: Secondary | ICD-10-CM | POA: Diagnosis not present

## 2017-02-16 DIAGNOSIS — S8002XA Contusion of left knee, initial encounter: Secondary | ICD-10-CM | POA: Diagnosis not present

## 2017-02-24 DIAGNOSIS — M7582 Other shoulder lesions, left shoulder: Secondary | ICD-10-CM | POA: Diagnosis not present

## 2017-03-19 DIAGNOSIS — H1045 Other chronic allergic conjunctivitis: Secondary | ICD-10-CM | POA: Diagnosis not present

## 2017-03-19 DIAGNOSIS — J4520 Mild intermittent asthma, uncomplicated: Secondary | ICD-10-CM | POA: Diagnosis not present

## 2017-03-19 DIAGNOSIS — J300 Vasomotor rhinitis: Secondary | ICD-10-CM | POA: Diagnosis not present

## 2017-03-19 DIAGNOSIS — K219 Gastro-esophageal reflux disease without esophagitis: Secondary | ICD-10-CM | POA: Diagnosis not present

## 2017-03-24 ENCOUNTER — Other Ambulatory Visit: Payer: Self-pay | Admitting: Women's Health

## 2017-03-24 NOTE — Telephone Encounter (Signed)
Okay for refill?  

## 2017-03-24 NOTE — Telephone Encounter (Signed)
Called into pharmacy

## 2017-04-02 ENCOUNTER — Encounter: Payer: Self-pay | Admitting: Gynecology

## 2017-04-17 IMAGING — MR MR LUMBAR SPINE W/O CM
4 of 5 series · 19 of 48 positions shown · non-contrast
Comparison: None.

CLINICAL DATA: Lumbar pain radiating into both hips

EXAM:
MRI LUMBAR SPINE WITHOUT CONTRAST
TECHNIQUE: Multiplanar, multisequence MR imaging of the lumbar spine was
performed. No intravenous contrast was administered.

[Series 6: T2 · sagittal · 4.0mm · 0.73mm/px · 7 of 15 slices shown (1 of 2)]
[im 1/15]
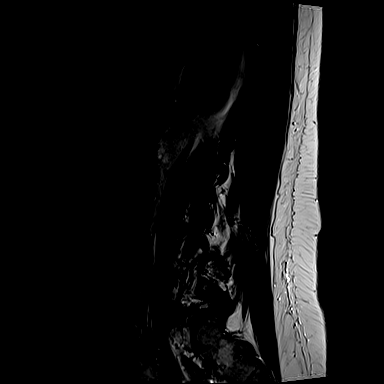
[im 3/15]
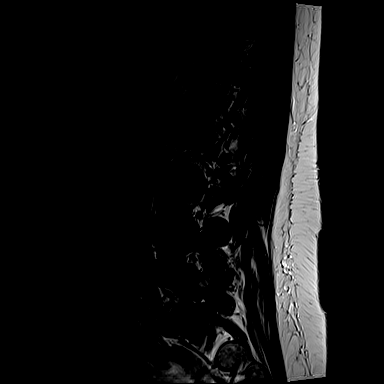
[im 5/15]
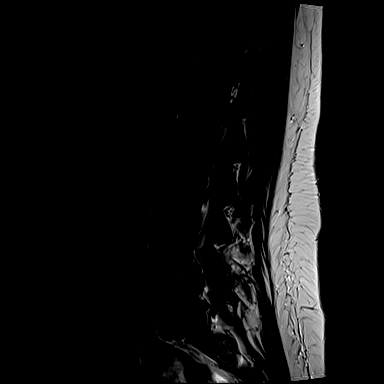
[im 8/15]
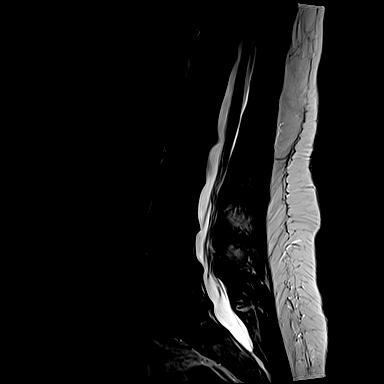
[im 10/15]
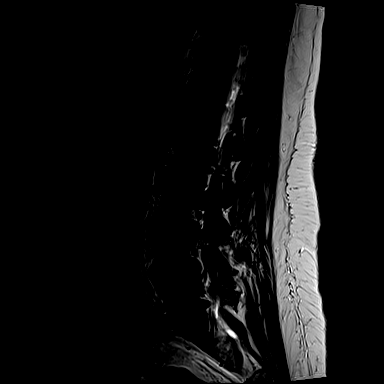
[im 12/15]
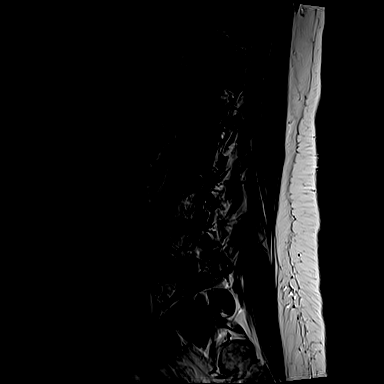
[im 15/15]
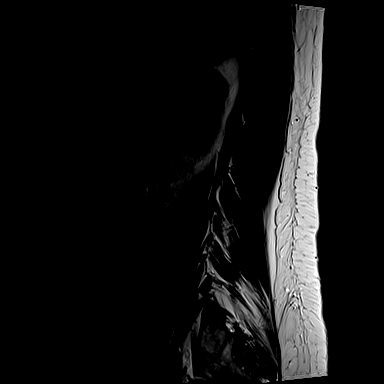

[Series 7: T1 · sagittal · 4.0mm · 0.73mm/px · 3 of 15 slices shown (1 of 2)]
[im 3/15]
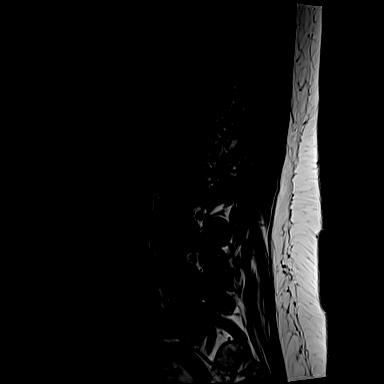
[im 8/15]
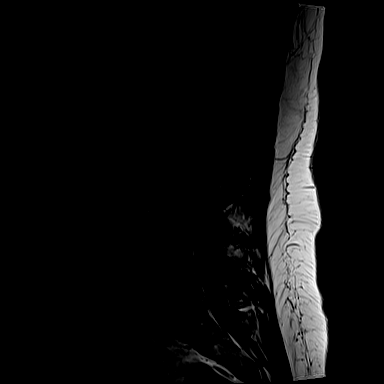
[im 12/15]
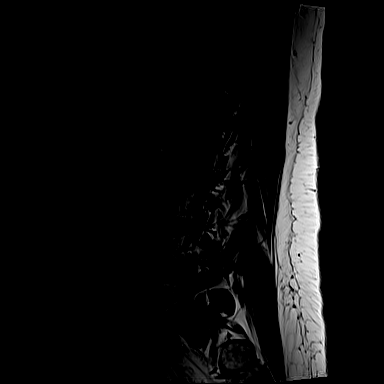

[Series 11: T1 · axial · 4.0mm · 0.28mm/px · z∈[+2,+119]mm · 3 of 34 slices shown (2 of 2)]
[im 6/34]
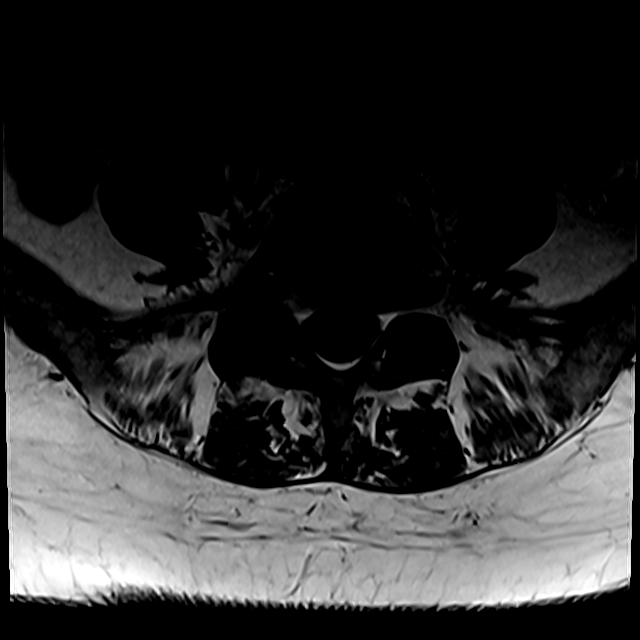
[im 18/34]
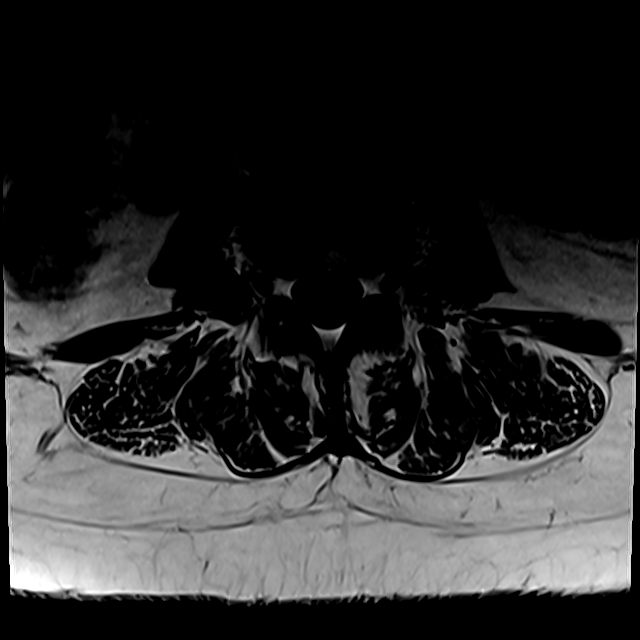
[im 28/34]
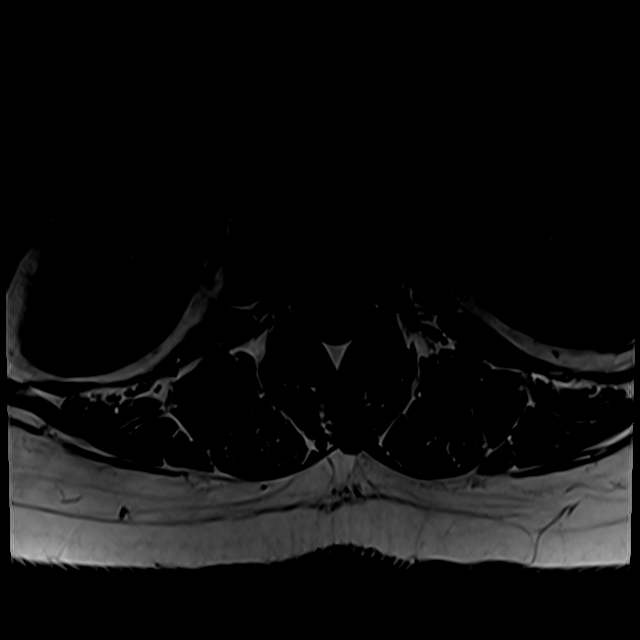

[Series 14: T2 · axial · 4.0mm · 0.28mm/px · z∈[-23,+119]mm · 6 of 34 slices shown (2 of 2)]
[im 1/34]
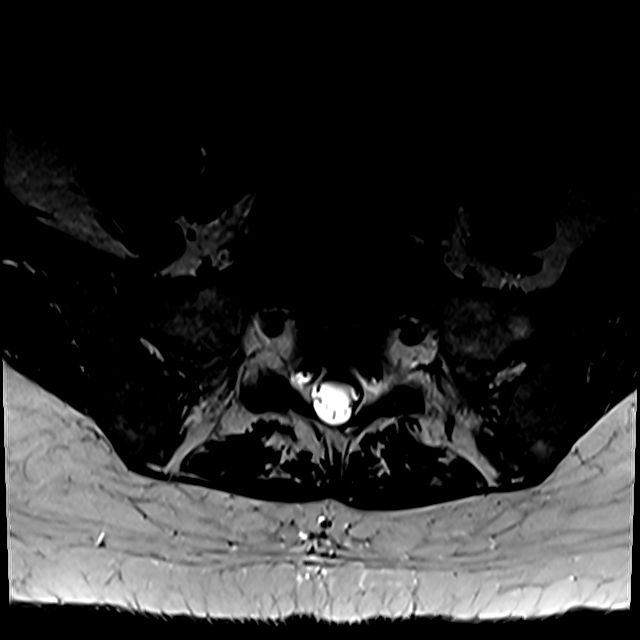
[im 6/34]
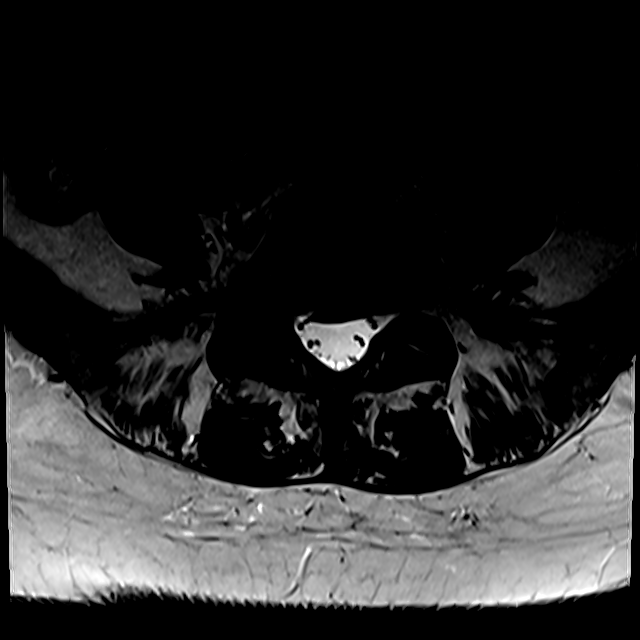
[im 11/34]
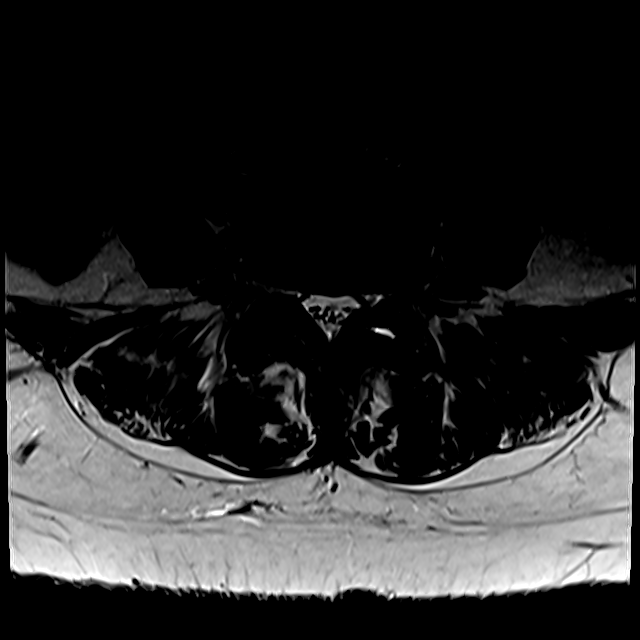
[im 16/34]
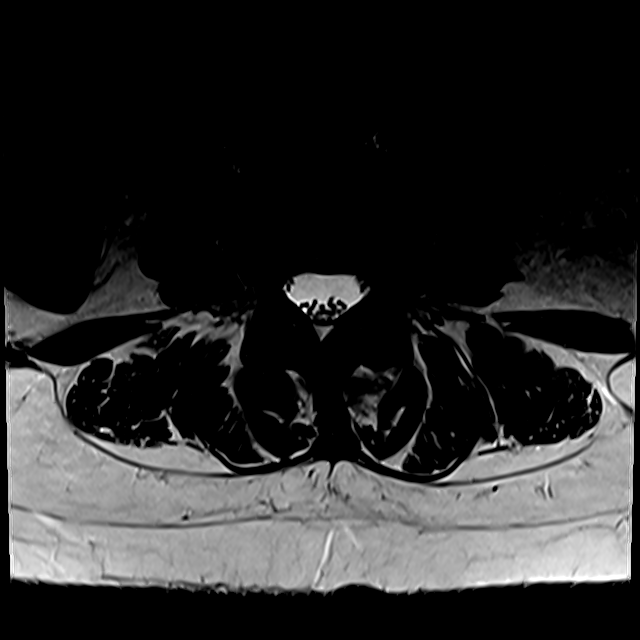
[im 18/34]
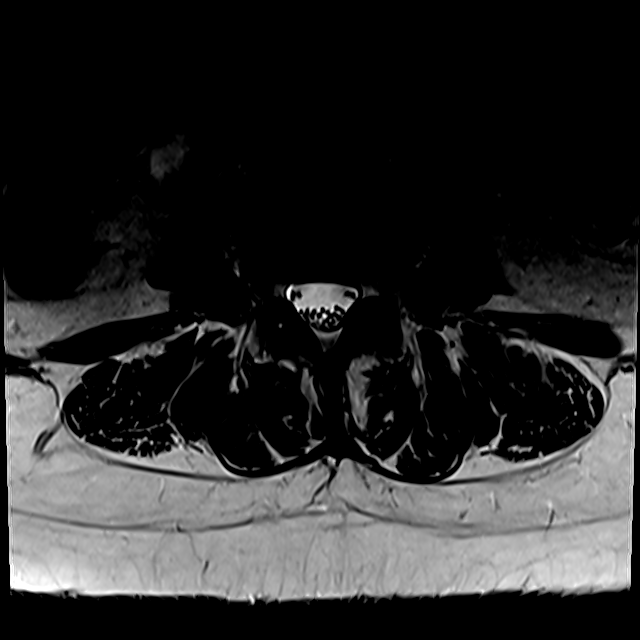
[im 28/34]
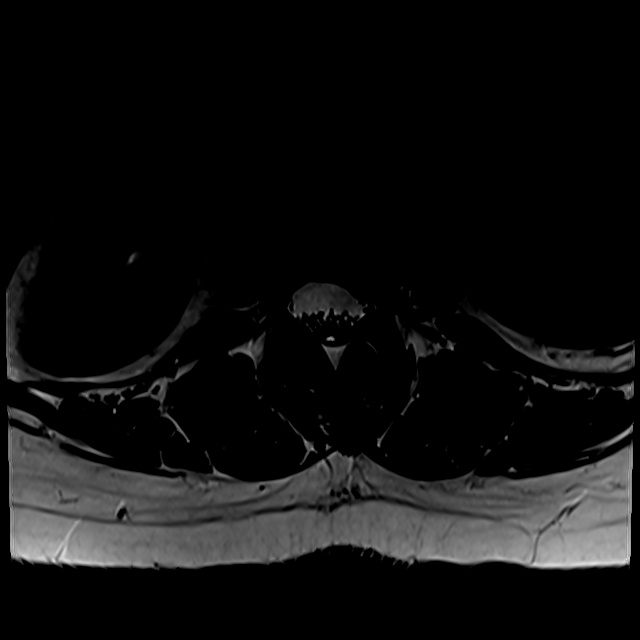

[19 of 48 positions shown; findings below may reference images not displayed]

FINDINGS: Segmentation: Normal. The lowest disc space is considered to be
L5-S1.

Alignment:  Normal

Vertebrae: Multilevel degenerative endplate signal changes. No
compression fracture or evidence of discitis osteomyelitis.

Conus medullaris: Extends to the L1 level and appears normal.

Paraspinal and other soft tissues: The visualized aorta, IVC and
iliac vessels are normal. The visualized retroperitoneal organs and
paraspinal soft tissues are normal.

Disc levels:

T10-T12: Evaluated on sagittal images only. No significant disc
herniation, spinal canal stenosis or neuroforaminal narrowing.

L1-L2: There is a medium-sized left subarticular disc extrusion
narrowing the left lateral recess. No central spinal canal stenosis.
Mild left neuroforaminal stenosis.

L2-L3: There is a medium-sized left subarticular/foraminal disc
protrusion that narrows the left lateral recess and displaces the
descending left L3 nerve root. No spinal canal stenosis. Mild left
neuroforaminal stenosis.

L3-L4: Narrowing of the intervertebral disc space with small disc
bulge. No spinal canal stenosis. Mild left neuroforaminal stenosis
due to endplate spurring and mild focality of the far lateral
portion of the disc bulge.

L4-L5: Narrowing of the intervertebral disc space with brought disc
bulge and severe bilateral facet hypertrophy. Small amount of fluid
in the left facet joint. No spinal canal stenosis. Mild bilateral
neuroforaminal stenosis.

L5-S1: Severe disc space loss with small disc bulge and severe facet
hypertrophy. No spinal canal stenosis. Moderate right and mild left
multifactorial neuroforaminal stenosis.
IMPRESSION: 1. Medium-sized left subarticular disc protrusion at L1-L2 narrowing
the left lateral recess, which may be a source of left L2
radiculopathy.
2. Medium-sized left subarticular/foraminal protrusion at L2-L3,
narrowing the left lateral recess and displacing the descending left
L3 nerve root, which may be a source of left L3 radiculopathy.
3. Mild to moderate neural foraminal stenosis at multiple levels,
worst at right L5-S1.

## 2017-04-18 DIAGNOSIS — L821 Other seborrheic keratosis: Secondary | ICD-10-CM | POA: Diagnosis not present

## 2017-04-18 DIAGNOSIS — D1801 Hemangioma of skin and subcutaneous tissue: Secondary | ICD-10-CM | POA: Diagnosis not present

## 2017-04-18 DIAGNOSIS — L812 Freckles: Secondary | ICD-10-CM | POA: Diagnosis not present

## 2017-04-18 DIAGNOSIS — L718 Other rosacea: Secondary | ICD-10-CM | POA: Diagnosis not present

## 2017-04-25 DIAGNOSIS — J4520 Mild intermittent asthma, uncomplicated: Secondary | ICD-10-CM | POA: Diagnosis not present

## 2017-04-25 DIAGNOSIS — J300 Vasomotor rhinitis: Secondary | ICD-10-CM | POA: Diagnosis not present

## 2017-04-25 DIAGNOSIS — H1045 Other chronic allergic conjunctivitis: Secondary | ICD-10-CM | POA: Diagnosis not present

## 2017-04-25 DIAGNOSIS — J019 Acute sinusitis, unspecified: Secondary | ICD-10-CM | POA: Diagnosis not present

## 2017-05-09 DIAGNOSIS — J019 Acute sinusitis, unspecified: Secondary | ICD-10-CM | POA: Diagnosis not present

## 2017-05-20 DIAGNOSIS — M47816 Spondylosis without myelopathy or radiculopathy, lumbar region: Secondary | ICD-10-CM | POA: Diagnosis not present

## 2017-05-30 DIAGNOSIS — H5213 Myopia, bilateral: Secondary | ICD-10-CM | POA: Diagnosis not present

## 2017-05-30 DIAGNOSIS — H40013 Open angle with borderline findings, low risk, bilateral: Secondary | ICD-10-CM | POA: Diagnosis not present

## 2017-06-05 ENCOUNTER — Other Ambulatory Visit: Payer: Self-pay | Admitting: Women's Health

## 2017-06-05 DIAGNOSIS — I10 Essential (primary) hypertension: Secondary | ICD-10-CM | POA: Diagnosis not present

## 2017-06-05 DIAGNOSIS — R12 Heartburn: Secondary | ICD-10-CM | POA: Diagnosis not present

## 2017-06-05 NOTE — Telephone Encounter (Signed)
Ok for refill, please call and review best not to take daily, review sleep hypgiene

## 2017-06-05 NOTE — Telephone Encounter (Signed)
Called into pharmacy

## 2017-06-09 ENCOUNTER — Other Ambulatory Visit: Payer: Self-pay | Admitting: Women's Health

## 2017-06-17 DIAGNOSIS — G43019 Migraine without aura, intractable, without status migrainosus: Secondary | ICD-10-CM | POA: Diagnosis not present

## 2017-06-17 DIAGNOSIS — G43719 Chronic migraine without aura, intractable, without status migrainosus: Secondary | ICD-10-CM | POA: Diagnosis not present

## 2017-07-02 ENCOUNTER — Encounter (HOSPITAL_COMMUNITY): Payer: Self-pay | Admitting: Emergency Medicine

## 2017-07-02 ENCOUNTER — Emergency Department (HOSPITAL_COMMUNITY)
Admission: EM | Admit: 2017-07-02 | Discharge: 2017-07-02 | Disposition: A | Discharge status: Home / Self Care | Payer: BLUE CROSS/BLUE SHIELD | Attending: Emergency Medicine | Admitting: Emergency Medicine

## 2017-07-02 DIAGNOSIS — F17210 Nicotine dependence, cigarettes, uncomplicated: Secondary | ICD-10-CM | POA: Diagnosis not present

## 2017-07-02 DIAGNOSIS — I10 Essential (primary) hypertension: Secondary | ICD-10-CM | POA: Insufficient documentation

## 2017-07-02 DIAGNOSIS — J45909 Unspecified asthma, uncomplicated: Secondary | ICD-10-CM | POA: Diagnosis not present

## 2017-07-02 DIAGNOSIS — G43A0 Cyclical vomiting, not intractable: Secondary | ICD-10-CM | POA: Diagnosis not present

## 2017-07-02 DIAGNOSIS — Z79899 Other long term (current) drug therapy: Secondary | ICD-10-CM | POA: Diagnosis not present

## 2017-07-02 DIAGNOSIS — R112 Nausea with vomiting, unspecified: Secondary | ICD-10-CM | POA: Diagnosis not present

## 2017-07-02 DIAGNOSIS — R109 Unspecified abdominal pain: Secondary | ICD-10-CM | POA: Diagnosis not present

## 2017-07-02 DIAGNOSIS — R1115 Cyclical vomiting syndrome unrelated to migraine: Secondary | ICD-10-CM

## 2017-07-02 LAB — COMPREHENSIVE METABOLIC PANEL
ALBUMIN: 4.9 g/dL (ref 3.5–5.0)
ALT: 23 U/L (ref 14–54)
ANION GAP: 15 (ref 5–15)
AST: 26 U/L (ref 15–41)
Alkaline Phosphatase: 75 U/L (ref 38–126)
BILIRUBIN TOTAL: 0.7 mg/dL (ref 0.3–1.2)
BUN: 8 mg/dL (ref 6–20)
CO2: 26 mmol/L (ref 22–32)
Calcium: 10.1 mg/dL (ref 8.9–10.3)
Chloride: 94 mmol/L — ABNORMAL LOW (ref 101–111)
Creatinine, Ser: 0.67 mg/dL (ref 0.44–1.00)
GFR calc Af Amer: >60 mL/min (ref >60)
GFR calc non Af Amer: >60 mL/min (ref >60)
GLUCOSE: 155 mg/dL — AB (ref 65–99)
POTASSIUM: 3.1 mmol/L — AB (ref 3.5–5.1)
Sodium: 135 mmol/L (ref 135–145)
Total Protein: 8.4 g/dL — ABNORMAL HIGH (ref 6.5–8.1)

## 2017-07-02 LAB — LIPASE, BLOOD: Lipase: 19 U/L (ref 11–51)

## 2017-07-02 LAB — URINALYSIS, ROUTINE W REFLEX MICROSCOPIC
BILIRUBIN URINE: NEGATIVE
GLUCOSE, UA: NEGATIVE mg/dL
HGB URINE DIPSTICK: NEGATIVE
KETONES UR: 80 mg/dL — AB
Leukocytes, UA: NEGATIVE
Nitrite: NEGATIVE
PH: 9 — AB (ref 5.0–8.0)
Protein, ur: NEGATIVE mg/dL
SPECIFIC GRAVITY, URINE: 1.011 (ref 1.005–1.030)

## 2017-07-02 LAB — CBC
HEMATOCRIT: 42.6 % (ref 36.0–46.0)
HEMOGLOBIN: 15.5 g/dL — AB (ref 12.0–15.0)
MCH: 29.8 pg (ref 26.0–34.0)
MCHC: 36.4 g/dL — AB (ref 30.0–36.0)
MCV: 81.9 fL (ref 78.0–100.0)
Platelets: 444 10*3/uL — ABNORMAL HIGH (ref 150–400)
RBC: 5.2 MIL/uL — ABNORMAL HIGH (ref 3.87–5.11)
RDW: 14.2 % (ref 11.5–15.5)
WBC: 11.9 10*3/uL — ABNORMAL HIGH (ref 4.0–10.5)

## 2017-07-02 MED ORDER — ONDANSETRON 4 MG PO TBDP: 4.0000 mg | ORAL_TABLET | Freq: Three times a day (TID) | ORAL | 0 refills | Status: DC | PRN

## 2017-07-02 MED ORDER — ONDANSETRON HCL 4 MG/2ML IJ SOLN
4.0000 mg | Freq: Once | INTRAMUSCULAR | Status: AC
  Administered 2017-07-02: 4 mg via INTRAVENOUS
  Filled 2017-07-02: qty 2

## 2017-07-02 MED ORDER — MAGNESIUM SULFATE 2 GM/50ML IV SOLN
2.0000 g | Freq: Once | INTRAVENOUS | Status: AC
  Administered 2017-07-02: 2 g via INTRAVENOUS
  Filled 2017-07-02: qty 50

## 2017-07-02 MED ORDER — POTASSIUM CHLORIDE 10 MEQ/100ML IV SOLN
10.0000 meq | Freq: Once | INTRAVENOUS | Status: AC
  Administered 2017-07-02: 10 meq via INTRAVENOUS
  Filled 2017-07-02: qty 100

## 2017-07-02 MED ORDER — DICYCLOMINE HCL 20 MG PO TABS: 20.0000 mg | ORAL_TABLET | Freq: Two times a day (BID) | ORAL | 0 refills | Status: DC

## 2017-07-02 MED ORDER — HYDROMORPHONE HCL 1 MG/ML IJ SOLN
1.0000 mg | INTRAMUSCULAR | Status: AC | PRN
  Administered 2017-07-02 (×2): 1 mg via INTRAVENOUS
  Filled 2017-07-02 (×2): qty 1

## 2017-07-02 MED ORDER — POTASSIUM CHLORIDE ER 10 MEQ PO TBCR: 20.0000 meq | EXTENDED_RELEASE_TABLET | Freq: Two times a day (BID) | ORAL | 0 refills | Status: DC

## 2017-07-02 MED ORDER — SODIUM CHLORIDE 0.9 % IV BOLUS (SEPSIS)
1000.0000 mL | Freq: Once | INTRAVENOUS | Status: AC
  Administered 2017-07-02: 1000 mL via INTRAVENOUS

## 2017-07-02 MED ORDER — ONDANSETRON 4 MG PO TBDP
4.0000 mg | ORAL_TABLET | Freq: Once | ORAL | Status: AC | PRN
  Administered 2017-07-02: 4 mg via ORAL
  Filled 2017-07-02: qty 1

## 2017-07-02 MED ORDER — HALOPERIDOL LACTATE 5 MG/ML IJ SOLN
3.0000 mg | Freq: Once | INTRAMUSCULAR | Status: AC
  Administered 2017-07-02: 3 mg via INTRAVENOUS
  Filled 2017-07-02: qty 1

## 2017-07-02 MED ORDER — POTASSIUM CHLORIDE CRYS ER 20 MEQ PO TBCR
40.0000 meq | EXTENDED_RELEASE_TABLET | Freq: Once | ORAL | Status: AC
  Administered 2017-07-02: 40 meq via ORAL
  Filled 2017-07-02: qty 2

## 2017-07-02 NOTE — ED Triage Notes (Addendum)
Pt c/o epigastric abdominal pain, emesis, diaphoresis onset at 0800. No sick contacts. Self-treated with ODT zofran at 0900 without relief. Hx cyclic vomiting syndrome, states current symptoms feel identical to her usual cyclic vomiting syndrome.

## 2017-07-02 NOTE — ED Provider Notes (Signed)
[facility]-EMERGENCY DEPT Provider Note   CSN: [id number] Arrival date & time: 07/02/17  1315     History   Chief Complaint Chief Complaint  Patient presents with  . Abdominal Pain  . Emesis    HPI Katie Chang is a 56 y.o. female.  She presents for evaluation of upper abdominal pain nausea and vomiting, both started several hours ago.  She has recurrent problems like this, since age 73.  Last episode was several months ago.  She states her GI doctor diagnosed her with cyclic vomiting syndrome because of the symptoms, and that she needs sporadic treatment, for it.  She denies blood in emesis, fever, chills, chest pain, weakness or dizziness.  She believes the trigger today for the episode, is "constipation."  She has never had a bowel obstruction.  She states that she typically gets better when receiving IV fluids and Dilaudid for this.  HPI  Past Medical History:  Diagnosis Date  . Allergy   . Arthritis   . Asthma   . Cervical dysplasia   . Cyclic vomiting syndrome   . DDD (degenerative disc disease), cervical    Cervical and Lumbar  . Depression   . Eczema   . GERD (gastroesophageal reflux disease)   . Hypertension   . Menopausal symptoms   . Migraines   . PONV (postoperative nausea and vomiting)    sometimes nausea  . Recurrent sinus infections     Patient Active Problem List   Diagnosis Date Noted  . Depression   . Asthma   . Eczema   . Cervical dysplasia   . Menopausal symptoms   . Migraines     Past Surgical History:  Procedure Laterality Date  . CERVICAL BIOPSY     Dr. Eda Paschal  . COLONOSCOPY W/ POLYPECTOMY    . COLPOSCOPY    . ESOPHAGOGASTRODUODENOSCOPY (EGD) WITH PROPOFOL N/A 05/13/2016   Procedure: ESOPHAGOGASTRODUODENOSCOPY (EGD) WITH PROPOFOL;  Surgeon: Charolett Bumpers, MD;  Location: [facility];  Service: Endoscopy;  Laterality: N/A;  . KNEE SURGERY    . NASAL SINUS SURGERY  1992  . NECK SURGERY    . TONSILLECTOMY  1990    OB History    Gravida Para Term Preterm AB Living   1       1 0   SAB TAB Ectopic Multiple Live Births                   Home Medications    Prior to Admission medications   Medication Sig Start Date End Date Taking? Authorizing Provider  albuterol (PROAIR HFA) 108 (90 BASE) MCG/ACT inhaler Inhale 2 puffs into the lungs every 6 (six) hours as needed for wheezing. 03/11/13  Yes Dunn, Raymon Mutton, PA-C  ALPRAZolam (XANAX) 0.25 MG tablet TAKE 1 TABLET AT BEDTIME AS NEEDED FOR ANXIETY. Patient taking differently: TAKE 0.25 mg AT BEDTIME AS NEEDED FOR ANXIETY. 06/05/17  Yes Harrington Challenger, NP  chlorthalidone (HYGROTON) 25 MG tablet Take 25 mg by mouth daily.  04/08/16  Yes [provider]  cholecalciferol (VITAMIN D) 1000 units tablet Take 1,000 Units by mouth daily.   Yes [provider]  Estradiol-Norethindrone Acet (ACTIVELLA) 0.5-0.1 MG tablet Take 1 tablet by mouth daily. 08/30/16  Yes Harrington Challenger, NP  levocetirizine (XYZAL) 5 MG tablet Take 5 mg by mouth daily.  12/07/14  Yes [provider]  metroNIDAZOLE (METROGEL) 0.75 % gel Apply 1 application topically daily as needed (acne).  04/18/17  Yes  [provider]  montelukast (SINGULAIR) 10 MG tablet TAKE ONE TABLET AT BEDTIME. Patient taking differently: TAKE 10 mg once daily 04/22/14  Yes Ethelda Chick, MD  NEXIUM 40 MG capsule Take 40 mg by mouth daily.  12/07/14  Yes [provider]  ondansetron (ZOFRAN-ODT) 4 MG disintegrating tablet Take 1 tablet by mouth every 8 (eight) hours as needed for nausea.  05/03/16  Yes [provider]  oxyCODONE-acetaminophen (PERCOCET/ROXICET) 5-325 MG tablet Take 1-2 tablets by mouth every 6 (six) hours as needed. 06/20/17  Yes [provider]  venlafaxine XR (EFFEXOR-XR) 37.5 MG 24 hr capsule Take 112.5 mg by mouth daily. 06/17/17  Yes [provider]  dicyclomine (BENTYL) 20 MG tablet Take 1 tablet (20 mg total) by mouth 2 (two) times daily. 07/02/17    Alvira Monday, MD  ondansetron (ZOFRAN ODT) 4 MG disintegrating tablet Take 1 tablet (4 mg total) by mouth every 8 (eight) hours as needed for nausea or vomiting. 07/02/17   Alvira Monday, MD  potassium chloride (K-DUR) 10 MEQ tablet Take 2 tablets (20 mEq total) by mouth 2 (two) times daily. 07/02/17 07/04/17  Alvira Monday, MD    Family History Family History  Problem Relation Age of Onset  . Heart disease Mother   . Hypertension Father   . Diabetes Paternal Grandfather   . Heart disease Paternal Grandfather   . Stroke Maternal Grandmother   . Stroke Maternal Grandfather   . Breast cancer Maternal Aunt        Age 35's    Social History Social History  Substance Use Topics  . Smoking status: Current Every Day Smoker    Packs/day: 1.50    Years: 10.00    Types: Cigarettes  . Smokeless tobacco: Never Used  . Alcohol use No     Comment: rare     Allergies   Avelox [moxifloxacin hcl in nacl]   Review of Systems Review of Systems  All other systems reviewed and are negative.    Physical Exam Updated Vital Signs BP 111/76   Pulse 88   Temp 98.3 F (36.8 C) (Oral)   Resp 19   SpO2 100%   Physical Exam  Constitutional: She is oriented to person, place, and time. She appears well-developed and well-nourished. She appears distressed.  Overweight  HENT:  Head: Normocephalic and atraumatic.  Eyes: Pupils are equal, round, and reactive to light. Conjunctivae and EOM are normal.  Neck: Normal range of motion and phonation normal. Neck supple.  Cardiovascular: Normal rate and regular rhythm.   Pulmonary/Chest: Effort normal and breath sounds normal. She exhibits no tenderness.  Abdominal: Soft. She exhibits no distension and no mass. There is tenderness (Diffuse, mild). There is no rebound and no guarding.  Musculoskeletal: Normal range of motion.  Neurological: She is alert and oriented to person, place, and time. She exhibits normal muscle tone.  Skin: Skin is  warm and dry.  Psychiatric: She has a normal mood and affect. Her behavior is normal. Judgment and thought content normal.  Nursing note and vitals reviewed.    ED Treatments / Results  Labs (all labs ordered are listed, but only abnormal results are displayed) Labs Reviewed  COMPREHENSIVE METABOLIC PANEL - Abnormal; Notable for the following:       Result Value   Potassium 3.1 (*)    Chloride 94 (*)    Glucose, Bld 155 (*)    Total Protein 8.4 (*)    All other components within normal  limits  CBC - Abnormal; Notable for the following:    WBC 11.9 (*)    RBC 5.20 (*)    Hemoglobin 15.5 (*)    MCHC 36.4 (*)    Platelets 444 (*)    All other components within normal limits  URINALYSIS, ROUTINE W REFLEX MICROSCOPIC - Abnormal; Notable for the following:    APPearance CLOUDY (*)    pH 9.0 (*)    Ketones, ur 80 (*)    All other components within normal limits  LIPASE, BLOOD    EKG  EKG Interpretation None       Radiology No results found.  Procedures Procedures (including critical care time)  Medications Ordered in ED Medications  ondansetron (ZOFRAN-ODT) disintegrating tablet 4 mg (4 mg Oral Given 07/02/17 1339)  sodium chloride 0.9 % bolus 1,000 mL (0 mLs Intravenous Stopped 07/02/17 2158)  ondansetron (ZOFRAN) injection 4 mg (4 mg Intravenous Given 07/02/17 1547)  HYDROmorphone (DILAUDID) injection 1 mg (1 mg Intravenous Given 07/02/17 1640)  potassium chloride SA (K-DUR,KLOR-CON) CR tablet 40 mEq (40 mEq Oral Given 07/02/17 1731)  potassium chloride 10 mEq in 100 mL IVPB (0 mEq Intravenous Stopped 07/02/17 2029)  magnesium sulfate IVPB 2 g 50 mL (0 g Intravenous Stopped 07/02/17 2059)  haloperidol lactate (HALDOL) injection 3 mg (3 mg Intravenous Given 07/02/17 1928)     Initial Impression / Assessment and Plan / ED Course  I have reviewed the triage vital signs and the nursing notes.  Pertinent labs & imaging results that were available during my care of the  patient were reviewed by me and considered in my medical decision making (see chart for details).      Final Clinical Impressions(s) / ED Diagnoses   Final diagnoses:  Non-intractable cyclical vomiting with nausea   Recurrent abdominal pain with vomiting. Mild secondary hypokalemia.  Nursing Notes Reviewed/ Care Coordinated Applicable Imaging Reviewed Interpretation of Laboratory Data incorporated into ED treatment   Care to oncoming provider team to disposition after a period of observation  New Prescriptions Discharge Medication List as of 07/02/2017  9:16 PM    START taking these medications   Details  dicyclomine (BENTYL) 20 MG tablet Take 1 tablet (20 mg total) by mouth 2 (two) times daily., Starting Wed 07/02/2017, Print    !! ondansetron (ZOFRAN ODT) 4 MG disintegrating tablet Take 1 tablet (4 mg total) by mouth every 8 (eight) hours as needed for nausea or vomiting., Starting Wed 07/02/2017, Print    potassium chloride (K-DUR) 10 MEQ tablet Take 2 tablets (20 mEq total) by mouth 2 (two) times daily., Starting Wed 07/02/2017, Until Fri 07/04/2017, Print     !! - Potential duplicate medications found. Please discuss with provider.       Mancel BaleWentz, Montague Corella, MD 07/04/17 1134

## 2017-07-03 NOTE — ED Provider Notes (Signed)
Received care of patient from Dr. Effie ShyWentz at [facility].  Patient with cyclic vomiting, low suspicion for other acute obstruction or abnormalities.   Patient with emesis of oral K.  Given haldol, IV potassium, IV magnesium with improvement in symptoms. Tolerating po. Stable for discharge. Given rx for bentyl, K,  and zofran.   Alvira MondaySchlossman, Cordai Rodrigue, MD 07/04/17 [phone]

## 2017-09-02 ENCOUNTER — Encounter: Payer: BLUE CROSS/BLUE SHIELD | Admitting: Women's Health

## 2017-09-16 ENCOUNTER — Emergency Department (HOSPITAL_COMMUNITY)
Admission: EM | Admit: 2017-09-16 | Discharge: 2017-09-16 | Disposition: A | Discharge status: Home / Self Care | Payer: BLUE CROSS/BLUE SHIELD | Attending: Emergency Medicine | Admitting: Emergency Medicine

## 2017-09-16 ENCOUNTER — Encounter (HOSPITAL_COMMUNITY): Payer: Self-pay | Admitting: Emergency Medicine

## 2017-09-16 DIAGNOSIS — Z79899 Other long term (current) drug therapy: Secondary | ICD-10-CM | POA: Insufficient documentation

## 2017-09-16 DIAGNOSIS — I10 Essential (primary) hypertension: Secondary | ICD-10-CM | POA: Diagnosis not present

## 2017-09-16 DIAGNOSIS — J45909 Unspecified asthma, uncomplicated: Secondary | ICD-10-CM | POA: Insufficient documentation

## 2017-09-16 DIAGNOSIS — F17210 Nicotine dependence, cigarettes, uncomplicated: Secondary | ICD-10-CM | POA: Diagnosis not present

## 2017-09-16 DIAGNOSIS — R1110 Vomiting, unspecified: Secondary | ICD-10-CM | POA: Diagnosis not present

## 2017-09-16 DIAGNOSIS — E876 Hypokalemia: Secondary | ICD-10-CM | POA: Insufficient documentation

## 2017-09-16 LAB — URINALYSIS, ROUTINE W REFLEX MICROSCOPIC
Bilirubin Urine: NEGATIVE
Glucose, UA: NEGATIVE mg/dL
HGB URINE DIPSTICK: NEGATIVE
Ketones, ur: 5 mg/dL — AB
LEUKOCYTES UA: NEGATIVE
Nitrite: NEGATIVE
PROTEIN: NEGATIVE mg/dL
Specific Gravity, Urine: 1.012 (ref 1.005–1.030)
pH: 9 — ABNORMAL HIGH (ref 5.0–8.0)

## 2017-09-16 LAB — COMPREHENSIVE METABOLIC PANEL
ALT: 15 U/L (ref 14–54)
ANION GAP: 11 (ref 5–15)
AST: 20 U/L (ref 15–41)
Albumin: 3.7 g/dL (ref 3.5–5.0)
Alkaline Phosphatase: 60 U/L (ref 38–126)
BILIRUBIN TOTAL: 0.7 mg/dL (ref 0.3–1.2)
BUN: 9 mg/dL (ref 6–20)
CO2: 23 mmol/L (ref 22–32)
Calcium: 8.3 mg/dL — ABNORMAL LOW (ref 8.9–10.3)
Chloride: 104 mmol/L (ref 101–111)
Creatinine, Ser: 0.52 mg/dL (ref 0.44–1.00)
Glucose, Bld: 130 mg/dL — ABNORMAL HIGH (ref 65–99)
POTASSIUM: 3.2 mmol/L — AB (ref 3.5–5.1)
Sodium: 138 mmol/L (ref 135–145)
TOTAL PROTEIN: 6.4 g/dL — AB (ref 6.5–8.1)

## 2017-09-16 LAB — CBC
HCT: 43.6 % (ref 36.0–46.0)
HEMOGLOBIN: 15.7 g/dL — AB (ref 12.0–15.0)
MCH: 30.3 pg (ref 26.0–34.0)
MCHC: 36 g/dL (ref 30.0–36.0)
MCV: 84.2 fL (ref 78.0–100.0)
Platelets: 421 10*3/uL — ABNORMAL HIGH (ref 150–400)
RBC: 5.18 MIL/uL — ABNORMAL HIGH (ref 3.87–5.11)
RDW: 13.4 % (ref 11.5–15.5)
WBC: 8.4 10*3/uL (ref 4.0–10.5)

## 2017-09-16 LAB — LIPASE, BLOOD: Lipase: 18 U/L (ref 11–51)

## 2017-09-16 LAB — MAGNESIUM: MAGNESIUM: 1.7 mg/dL (ref 1.7–2.4)

## 2017-09-16 MED ORDER — HYDROMORPHONE HCL 1 MG/ML IJ SOLN
1.0000 mg | Freq: Once | INTRAMUSCULAR | Status: AC
  Administered 2017-09-16: 1 mg via INTRAVENOUS
  Filled 2017-09-16: qty 1

## 2017-09-16 MED ORDER — POTASSIUM CHLORIDE 10 MEQ/100ML IV SOLN
10.0000 meq | Freq: Once | INTRAVENOUS | Status: AC
  Administered 2017-09-16: 10 meq via INTRAVENOUS
  Filled 2017-09-16: qty 100

## 2017-09-16 MED ORDER — POTASSIUM CHLORIDE CRYS ER 20 MEQ PO TBCR: 40.0000 meq | EXTENDED_RELEASE_TABLET | Freq: Every day | ORAL | 0 refills | Status: DC

## 2017-09-16 MED ORDER — POTASSIUM CHLORIDE CRYS ER 20 MEQ PO TBCR
40.0000 meq | EXTENDED_RELEASE_TABLET | Freq: Once | ORAL | Status: DC
  Filled 2017-09-16: qty 2

## 2017-09-16 MED ORDER — DICYCLOMINE HCL 20 MG PO TABS: 20.0000 mg | ORAL_TABLET | Freq: Three times a day (TID) | ORAL | 0 refills | Status: DC

## 2017-09-16 MED ORDER — ONDANSETRON 4 MG PO TBDP: 4.0000 mg | ORAL_TABLET | Freq: Three times a day (TID) | ORAL | 0 refills | Status: DC | PRN

## 2017-09-16 MED ORDER — METOCLOPRAMIDE HCL 5 MG/ML IJ SOLN
10.0000 mg | Freq: Once | INTRAMUSCULAR | Status: AC
  Administered 2017-09-16: 10 mg via INTRAVENOUS
  Filled 2017-09-16: qty 2

## 2017-09-16 MED ORDER — SODIUM CHLORIDE 0.9 % IV SOLN
8.0000 mg | Freq: Once | INTRAVENOUS | Status: AC
  Administered 2017-09-16: 8 mg via INTRAVENOUS
  Filled 2017-09-16: qty 4

## 2017-09-16 MED ORDER — HALOPERIDOL LACTATE 5 MG/ML IJ SOLN
5.0000 mg | Freq: Once | INTRAMUSCULAR | Status: AC
  Administered 2017-09-16: 5 mg via INTRAVENOUS
  Filled 2017-09-16: qty 1

## 2017-09-16 MED ORDER — SODIUM CHLORIDE 0.9 % IV BOLUS (SEPSIS)
2000.0000 mL | Freq: Once | INTRAVENOUS | Status: AC
  Administered 2017-09-16: 2000 mL via INTRAVENOUS

## 2017-09-16 MED ORDER — MAGNESIUM SULFATE 2 GM/50ML IV SOLN
2.0000 g | Freq: Once | INTRAVENOUS | Status: AC
  Administered 2017-09-16: 2 g via INTRAVENOUS
  Filled 2017-09-16: qty 50

## 2017-09-16 NOTE — ED Provider Notes (Signed)
On reassessment, the patient feels improved.  She is a 56 year old female with history of cyclical vomiting since age 56 here with acute on chronic vomiting.  Lab work is very reassuring.  Patient was given Haldol prior to my assessment with plan to p.o. challenge.  She is now able to drink without difficulty.  I discussed admission versus discharge with the patient detail.  She states she would like to trial outpatient treatment, which based on labs and her tolerance of p.o., I feel this is very reasonable.  Will discharge with good return precautions.   Shaune PollackIsaacs, Vasili Fok, MD 09/16/17 [phone]

## 2017-09-16 NOTE — ED Provider Notes (Signed)
Emergency Department Provider Note   I have reviewed the triage vital signs and the nursing notes.   HISTORY  Chief Complaint Emesis   HPI Katie Chang is a 56 y.o. female with past medical history of asthma, cyclic vomiting syndrome, depression, hypertension and multiple other medical problems presents emergency department with a "cyclic vomiting episode".  States that this is often initiated by having constipation which she had this morning.  She was having a bowel movement when she started vomiting and having his vomiting similar to previous episodes.  This persisted and did not get better with the Zofran so she came here for further evaluation.  She states she does have some epigastric abdominal pain associated with it as she always does.  No urinary changes.  No new back pain or other abdominal pain.  No fevers but she feels hot and is sweating.  States that Dilaudid and Zofran is usually what helps her.  Review of her records she is here approximately once or twice a year with cyclic vomiting type symptoms.  Symptoms resolved with Haldol sometimes help the Reglan sometimes help with Zofran.    Past Medical History:  Diagnosis Date  . Allergy   . Arthritis   . Asthma   . Cervical dysplasia   . Cyclic vomiting syndrome   . DDD (degenerative disc disease), cervical    Cervical and Lumbar  . Depression   . Eczema   . GERD (gastroesophageal reflux disease)   . Hypertension   . Menopausal symptoms   . Migraines   . PONV (postoperative nausea and vomiting)    sometimes nausea  . Recurrent sinus infections     Patient Active Problem List   Diagnosis Date Noted  . Depression   . Asthma   . Eczema   . Cervical dysplasia   . Menopausal symptoms   . Migraines     Past Surgical History:  Procedure Laterality Date  . CERVICAL BIOPSY     Dr. Eda PaschalGottsegen  . COLONOSCOPY W/ POLYPECTOMY    . COLPOSCOPY    . ESOPHAGOGASTRODUODENOSCOPY (EGD) WITH PROPOFOL N/A 05/13/2016   Procedure: ESOPHAGOGASTRODUODENOSCOPY (EGD) WITH PROPOFOL;  Surgeon: Charolett BumpersMartin K Johnson, MD;  Location: [facility];  Service: Endoscopy;  Laterality: N/A;  . KNEE SURGERY    . NASAL SINUS SURGERY  1992  . NECK SURGERY    . TONSILLECTOMY  1990    Current Outpatient Rx  . Order #: [id number] Class: Normal  . Order #: [id number] Class: Print  . Order #: [id number] Class: Historical Med  . Order #: [id number] Class: Historical Med  . Order #: [id number] Class: Print  . Order #: [id number] Class: Normal  . Order #: [id number] Class: Historical Med  . Order #: [id number] Class: Historical Med  . Order #: [id number] Class: Normal  . Order #: [id number] Class: Historical Med  . Order #: [id number] Class: Print  . Order #: [id number] Class: Historical Med  . Order #: [id number] Class: Historical Med  . Order #: [id number] Class: Print  . Order #: [id number] Class: Historical Med    Allergies Avelox [moxifloxacin hcl in nacl]  Family History  Problem Relation Age of Onset  . Heart disease Mother   . Hypertension Father   . Diabetes Paternal Grandfather   . Heart disease Paternal Grandfather   . Stroke Maternal Grandmother   . Stroke Maternal Grandfather   . Breast cancer Maternal Aunt        Age 56's    Social History Social History  Substance Use Topics  . Smoking status: Current Every  Day Smoker    Packs/day: 1.50    Years: 10.00    Types: Cigarettes  . Smokeless tobacco: Never Used  . Alcohol use No     Comment: rare    Review of Systems  All other systems negative except as documented in the HPI. All pertinent positives and negatives as reviewed in the HPI. ____________________________________________   PHYSICAL EXAM:  VITAL SIGNS: ED Triage Vitals  Enc Vitals Group     BP 09/16/17 1050 (!) 175/118     Pulse Rate 09/16/17 1050 100     Resp 09/16/17 1050 (!) 22     Temp 09/16/17 1050 97.6 F (36.4 C)     Temp Source 09/16/17 1050 Oral     SpO2 09/16/17 1050 100 %     Weight 09/16/17  1050 154 lb (69.9 kg)     Height 09/16/17 1050 5\' 5"  (1.651 m)     Head Circumference --      Peak Flow --      Pain Score 09/16/17 1059 8     Pain Loc --      Pain Edu? --      Excl. in GC? --     Constitutional: Alert and oriented. Well appearing, mild distress 2/2 vomiting. diaphoretic Eyes: Conjunctivae are normal. PERRL. EOMI. Head: Atraumatic. Nose: No congestion/rhinnorhea. Mouth/Throat: Mucous membranes are moist.  Oropharynx non-erythematous. Neck: No stridor.  No meningeal signs.   Cardiovascular: Normal rate, regular rhythm. Good peripheral circulation. Grossly normal heart sounds.   Respiratory: Normal respiratory effort.  No retractions. Lungs CTAB. Gastrointestinal: Soft and nontender. No distention.  Musculoskeletal: No lower extremity tenderness nor edema. No gross deformities of extremities. Neurologic:  Normal speech and language. No gross focal neurologic deficits are appreciated.  Skin:  Skin is warm, dry and intact. No rash noted.  ____________________________________________   LABS (all labs ordered are listed, but only abnormal results are displayed)  Labs Reviewed  LIPASE, BLOOD  COMPREHENSIVE METABOLIC PANEL  CBC  URINALYSIS, ROUTINE W REFLEX MICROSCOPIC   ____________________________________________  EKG   EKG Interpretation  Date/Time:    Ventricular Rate:    PR Interval:    QRS Duration:   QT Interval:    QTC Calculation:   R Axis:     Text Interpretation:         ____________________________________________  RADIOLOGY  No results found.  ____________________________________________   PROCEDURES  Procedure(s) performed:   Procedures   ____________________________________________   INITIAL IMPRESSION / ASSESSMENT AND PLAN / ED COURSE  Pertinent labs & imaging results that were available during my care of the patient were reviewed by me and considered in my medical decision making (see chart for details).   Now we  will treat with IV fluids, anti-emetics and pain medication however if her labs are reassuring we will hold on further pain medication and just treat her vomiting.  No indication for imaging at this time.  Patient with persisent vomiting, will try zofran per request.   On reevluation, states she has 'severe CVS pain' and needs more pain medication.  I discussed with her that if her pain was evaluation by doing CT scan however she refused.  I discussed that if I did not see any acute abnormalities that I was not willing to give her any more narcotic pain medication.  She then requested Haldol as this was what she had last time the seem to help.  Of note patient states she has vomited multiple times since she  has been here, however I am not seeing any of it. Will order Haldol.   Improved symptoms on reevaluation but not yet tried PO challenge. Care transferred pending PO challenge.  ____________________________________________  FINAL CLINICAL IMPRESSION(S) / ED DIAGNOSES  Final diagnoses:  Vomiting, intractability of vomiting not specified, presence of nausea not specified, unspecified vomiting type  Hypokalemia     MEDICATIONS GIVEN DURING THIS VISIT:  Medications - No data to display   NEW OUTPATIENT MEDICATIONS STARTED DURING THIS VISIT:  New Prescriptions   No medications on file    Note:  This document was prepared using Dragon voice recognition software and may include unintentional dictation errors.   Kailee Essman, Barbara Cower, MD 09/18/17 1350

## 2017-09-16 NOTE — ED Triage Notes (Signed)
Pt reports her cyclic vomiting began this am. Home zofran not helping. No diarrhea.

## 2017-09-25 ENCOUNTER — Other Ambulatory Visit: Payer: Self-pay | Admitting: Women's Health

## 2017-09-25 DIAGNOSIS — M4712 Other spondylosis with myelopathy, cervical region: Secondary | ICD-10-CM | POA: Diagnosis not present

## 2017-09-25 DIAGNOSIS — M47816 Spondylosis without myelopathy or radiculopathy, lumbar region: Secondary | ICD-10-CM | POA: Diagnosis not present

## 2017-10-02 ENCOUNTER — Other Ambulatory Visit: Payer: Self-pay | Admitting: Women's Health

## 2017-10-02 NOTE — Telephone Encounter (Signed)
Ok for refill? 

## 2017-10-02 NOTE — Telephone Encounter (Signed)
Rx called in 

## 2017-10-02 NOTE — Telephone Encounter (Signed)
Overdue for annual exam. Last one 08/29/16. Is scheduled for 11/20/2017.

## 2017-11-20 ENCOUNTER — Encounter: Payer: BLUE CROSS/BLUE SHIELD | Admitting: Women's Health

## 2017-12-16 DIAGNOSIS — G43019 Migraine without aura, intractable, without status migrainosus: Secondary | ICD-10-CM | POA: Diagnosis not present

## 2017-12-16 DIAGNOSIS — G43719 Chronic migraine without aura, intractable, without status migrainosus: Secondary | ICD-10-CM | POA: Diagnosis not present

## 2018-01-06 DIAGNOSIS — Z6826 Body mass index (BMI) 26.0-26.9, adult: Secondary | ICD-10-CM | POA: Diagnosis not present

## 2018-01-06 DIAGNOSIS — M4712 Other spondylosis with myelopathy, cervical region: Secondary | ICD-10-CM | POA: Diagnosis not present

## 2018-02-09 ENCOUNTER — Encounter (HOSPITAL_COMMUNITY): Payer: Self-pay | Admitting: Emergency Medicine

## 2018-02-09 ENCOUNTER — Observation Stay (HOSPITAL_COMMUNITY)
Admission: EM | Admit: 2018-02-09 | Discharge: 2018-02-10 | Disposition: A | Discharge status: Home / Self Care | Payer: BLUE CROSS/BLUE SHIELD | Attending: Internal Medicine | Admitting: Internal Medicine

## 2018-02-09 ENCOUNTER — Other Ambulatory Visit: Payer: Self-pay

## 2018-02-09 DIAGNOSIS — E878 Other disorders of electrolyte and fluid balance, not elsewhere classified: Secondary | ICD-10-CM | POA: Diagnosis not present

## 2018-02-09 DIAGNOSIS — E876 Hypokalemia: Secondary | ICD-10-CM | POA: Diagnosis not present

## 2018-02-09 DIAGNOSIS — J4520 Mild intermittent asthma, uncomplicated: Secondary | ICD-10-CM | POA: Diagnosis not present

## 2018-02-09 DIAGNOSIS — I10 Essential (primary) hypertension: Secondary | ICD-10-CM | POA: Diagnosis not present

## 2018-02-09 DIAGNOSIS — R1115 Cyclical vomiting syndrome unrelated to migraine: Secondary | ICD-10-CM

## 2018-02-09 DIAGNOSIS — K219 Gastro-esophageal reflux disease without esophagitis: Secondary | ICD-10-CM | POA: Insufficient documentation

## 2018-02-09 DIAGNOSIS — F32A Depression, unspecified: Secondary | ICD-10-CM | POA: Diagnosis present

## 2018-02-09 DIAGNOSIS — F329 Major depressive disorder, single episode, unspecified: Secondary | ICD-10-CM | POA: Insufficient documentation

## 2018-02-09 DIAGNOSIS — G43A0 Cyclical vomiting, not intractable: Principal | ICD-10-CM | POA: Insufficient documentation

## 2018-02-09 DIAGNOSIS — G43A1 Cyclical vomiting, intractable: Secondary | ICD-10-CM | POA: Diagnosis not present

## 2018-02-09 DIAGNOSIS — Z79899 Other long term (current) drug therapy: Secondary | ICD-10-CM | POA: Insufficient documentation

## 2018-02-09 DIAGNOSIS — F1290 Cannabis use, unspecified, uncomplicated: Secondary | ICD-10-CM | POA: Diagnosis not present

## 2018-02-09 DIAGNOSIS — F17210 Nicotine dependence, cigarettes, uncomplicated: Secondary | ICD-10-CM | POA: Insufficient documentation

## 2018-02-09 DIAGNOSIS — E860 Dehydration: Secondary | ICD-10-CM

## 2018-02-09 DIAGNOSIS — R451 Restlessness and agitation: Secondary | ICD-10-CM | POA: Diagnosis not present

## 2018-02-09 DIAGNOSIS — R112 Nausea with vomiting, unspecified: Secondary | ICD-10-CM | POA: Diagnosis not present

## 2018-02-09 DIAGNOSIS — K2970 Gastritis, unspecified, without bleeding: Secondary | ICD-10-CM | POA: Diagnosis not present

## 2018-02-09 DIAGNOSIS — R61 Generalized hyperhidrosis: Secondary | ICD-10-CM | POA: Diagnosis not present

## 2018-02-09 LAB — RAPID URINE DRUG SCREEN, HOSP PERFORMED
AMPHETAMINES: NOT DETECTED
BARBITURATES: NOT DETECTED
Benzodiazepines: NOT DETECTED
Cocaine: NOT DETECTED
Opiates: NOT DETECTED
TETRAHYDROCANNABINOL: POSITIVE — AB

## 2018-02-09 LAB — MAGNESIUM
Magnesium: 1.8 mg/dL (ref 1.7–2.4)
Magnesium: 2.9 mg/dL — ABNORMAL HIGH (ref 1.7–2.4)

## 2018-02-09 LAB — CBC
HEMATOCRIT: 43.9 % (ref 36.0–46.0)
HEMOGLOBIN: 15.6 g/dL — AB (ref 12.0–15.0)
MCH: 29.9 pg (ref 26.0–34.0)
MCHC: 35.5 g/dL (ref 30.0–36.0)
MCV: 84.3 fL (ref 78.0–100.0)
Platelets: 411 10*3/uL — ABNORMAL HIGH (ref 150–400)
RBC: 5.21 MIL/uL — ABNORMAL HIGH (ref 3.87–5.11)
RDW: 13.7 % (ref 11.5–15.5)
WBC: 8.1 10*3/uL (ref 4.0–10.5)

## 2018-02-09 LAB — URINALYSIS, ROUTINE W REFLEX MICROSCOPIC
BILIRUBIN URINE: NEGATIVE
GLUCOSE, UA: NEGATIVE mg/dL
HGB URINE DIPSTICK: NEGATIVE
Ketones, ur: 20 mg/dL — AB
Leukocytes, UA: NEGATIVE
Nitrite: NEGATIVE
PH: 8 (ref 5.0–8.0)
Protein, ur: NEGATIVE mg/dL
Specific Gravity, Urine: 1.011 (ref 1.005–1.030)

## 2018-02-09 LAB — COMPREHENSIVE METABOLIC PANEL
ALBUMIN: 4.3 g/dL (ref 3.5–5.0)
ALK PHOS: 75 U/L (ref 38–126)
ALT: 16 U/L (ref 14–54)
ANION GAP: 16 — AB (ref 5–15)
AST: 25 U/L (ref 15–41)
BUN: 11 mg/dL (ref 6–20)
CALCIUM: 9.4 mg/dL (ref 8.9–10.3)
CHLORIDE: 100 mmol/L — AB (ref 101–111)
CO2: 22 mmol/L (ref 22–32)
Creatinine, Ser: 0.65 mg/dL (ref 0.44–1.00)
GFR calc Af Amer: >60 mL/min (ref >60)
GFR calc non Af Amer: >60 mL/min (ref >60)
GLUCOSE: 121 mg/dL — AB (ref 65–99)
POTASSIUM: 2.2 mmol/L — AB (ref 3.5–5.1)
SODIUM: 138 mmol/L (ref 135–145)
Total Bilirubin: 0.6 mg/dL (ref 0.3–1.2)
Total Protein: 7.4 g/dL (ref 6.5–8.1)

## 2018-02-09 LAB — LIPASE, BLOOD: LIPASE: 20 U/L (ref 11–51)

## 2018-02-09 MED ORDER — VENLAFAXINE HCL ER 37.5 MG PO CP24
112.5000 mg | ORAL_CAPSULE | Freq: Every day | ORAL | Status: DC
  Administered 2018-02-10: 112.5 mg via ORAL
  Filled 2018-02-09: qty 1

## 2018-02-09 MED ORDER — ONDANSETRON HCL 4 MG/2ML IJ SOLN: 4.0000 mg | Freq: Four times a day (QID) | INTRAMUSCULAR | Status: DC | PRN

## 2018-02-09 MED ORDER — SODIUM CHLORIDE 0.9 % IV BOLUS
1000.0000 mL | Freq: Once | INTRAVENOUS | Status: AC
  Administered 2018-02-09: 1000 mL via INTRAVENOUS

## 2018-02-09 MED ORDER — ALBUTEROL SULFATE (2.5 MG/3ML) 0.083% IN NEBU: 2.5000 mg | INHALATION_SOLUTION | Freq: Four times a day (QID) | RESPIRATORY_TRACT | Status: DC | PRN

## 2018-02-09 MED ORDER — SODIUM CHLORIDE 0.9 % IV BOLUS
500.0000 mL | Freq: Once | INTRAVENOUS | Status: AC
  Administered 2018-02-09: 500 mL via INTRAVENOUS

## 2018-02-09 MED ORDER — ALBUTEROL SULFATE HFA 108 (90 BASE) MCG/ACT IN AERS: 2.0000 | INHALATION_SPRAY | Freq: Four times a day (QID) | RESPIRATORY_TRACT | Status: DC | PRN

## 2018-02-09 MED ORDER — MAGNESIUM SULFATE 2 GM/50ML IV SOLN
2.0000 g | Freq: Once | INTRAVENOUS | Status: AC
  Administered 2018-02-09: 2 g via INTRAVENOUS
  Filled 2018-02-09: qty 50

## 2018-02-09 MED ORDER — ONDANSETRON 4 MG PO TBDP: 4.0000 mg | ORAL_TABLET | Freq: Three times a day (TID) | ORAL | Status: DC | PRN

## 2018-02-09 MED ORDER — POLYETHYLENE GLYCOL 3350 17 G PO PACK: 17.0000 g | PACK | Freq: Every day | ORAL | Status: DC | PRN

## 2018-02-09 MED ORDER — ACETAMINOPHEN 325 MG PO TABS: 650.0000 mg | ORAL_TABLET | Freq: Four times a day (QID) | ORAL | Status: DC | PRN

## 2018-02-09 MED ORDER — HYDRALAZINE HCL 20 MG/ML IJ SOLN
10.0000 mg | Freq: Four times a day (QID) | INTRAMUSCULAR | Status: DC | PRN
  Administered 2018-02-09: 10 mg via INTRAVENOUS
  Filled 2018-02-09: qty 1

## 2018-02-09 MED ORDER — ACETAMINOPHEN 650 MG RE SUPP: 650.0000 mg | Freq: Four times a day (QID) | RECTAL | Status: DC | PRN

## 2018-02-09 MED ORDER — HALOPERIDOL LACTATE 5 MG/ML IJ SOLN
5.0000 mg | Freq: Once | INTRAMUSCULAR | Status: AC
  Administered 2018-02-09: 5 mg via INTRAVENOUS
  Filled 2018-02-09: qty 1

## 2018-02-09 MED ORDER — ALPRAZOLAM 0.25 MG PO TABS
0.2500 mg | ORAL_TABLET | Freq: Every evening | ORAL | Status: DC | PRN
Start: 2018-02-09 — End: 2018-02-10

## 2018-02-09 MED ORDER — KCL IN DEXTROSE-NACL 40-5-0.9 MEQ/L-%-% IV SOLN
INTRAVENOUS | Status: DC
  Administered 2018-02-09 – 2018-02-10 (×2): via INTRAVENOUS
  Filled 2018-02-09 (×3): qty 1000

## 2018-02-09 MED ORDER — ONDANSETRON HCL 4 MG PO TABS: 4.0000 mg | ORAL_TABLET | Freq: Four times a day (QID) | ORAL | Status: DC | PRN

## 2018-02-09 MED ORDER — HYDROMORPHONE HCL 1 MG/ML IJ SOLN
1.0000 mg | Freq: Once | INTRAMUSCULAR | Status: AC
  Administered 2018-02-09: 1 mg via INTRAVENOUS
  Filled 2018-02-09: qty 1

## 2018-02-09 MED ORDER — POTASSIUM CHLORIDE 10 MEQ/100ML IV SOLN
10.0000 meq | INTRAVENOUS | Status: AC
  Administered 2018-02-09 (×3): 10 meq via INTRAVENOUS
  Filled 2018-02-09 (×3): qty 100

## 2018-02-09 MED ORDER — MONTELUKAST SODIUM 10 MG PO TABS
10.0000 mg | ORAL_TABLET | Freq: Every day | ORAL | Status: DC
  Administered 2018-02-09: 10 mg via ORAL
  Filled 2018-02-09: qty 1

## 2018-02-09 NOTE — ED Notes (Signed)
CRITICAL VALUE STICKER  CRITICAL VALUE:  RECEIVER (on-site recipient of call):  DATE & TIME NOTIFIED: 02/09/18 1418  MESSENGER (representative from lab): Lab  MD NOTIFIED: Goldston,MD  TIME OF NOTIFICATION:02/09/18 1418  RESPONSE: See orders

## 2018-02-09 NOTE — ED Notes (Signed)
Sister Eber JonesCarolyn [phone]

## 2018-02-09 NOTE — ED Provider Notes (Addendum)
[facility]-EMERGENCY DEPT Provider Note   CSN: [id number] Arrival date & time: 02/09/18  1255     History   Chief Complaint Chief Complaint  Patient presents with  . Abdominal Pain    HPI Katie Chang is a 57 y.o. female who presents the emergency department with chief complaint of nausea and vomiting.  She has a past medical history of recurrent cyclic nausea vomiting syndrome.  I asked the patient about marijuana use and she immediately became very defensive stating "It is not that! It is not that!" She states that she is upset because the medicine for Cannabis hyperemesis doesn't work and that the only thing that works is zofran and 2mg  dilaudid. Patient had sudden onset of generalized abdominal pain, nausea, vomiting and diaphoresis.  It has been intractable since 9 AM this morning.  Her sister is by the bedside and states that they have a family history of the same and that both she and another sister get recurrent episodes of vomiting.  The patient has notable diaphoresis but denies chest pain.  States that this is the same as previous episodes of CNVS. She denies diarrhea, constipation.  HPI  Past Medical History:  Diagnosis Date  . Allergy   . Arthritis   . Asthma   . Cervical dysplasia   . Cyclic vomiting syndrome   . DDD (degenerative disc disease), cervical    Cervical and Lumbar  . Depression   . Eczema   . GERD (gastroesophageal reflux disease)   . Hypertension   . Menopausal symptoms   . Migraines   . PONV (postoperative nausea and vomiting)    sometimes nausea  . Recurrent sinus infections     Patient Active Problem List   Diagnosis Date Noted  . Depression   . Asthma   . Eczema   . Cervical dysplasia   . Menopausal symptoms   . Migraines     Past Surgical History:  Procedure Laterality Date  . CERVICAL BIOPSY     Dr. Eda Paschal  . COLONOSCOPY W/ POLYPECTOMY    . COLPOSCOPY    . ESOPHAGOGASTRODUODENOSCOPY (EGD) WITH PROPOFOL  N/A 05/13/2016   Procedure: ESOPHAGOGASTRODUODENOSCOPY (EGD) WITH PROPOFOL;  Surgeon: Charolett Bumpers, MD;  Location: [facility];  Service: Endoscopy;  Laterality: N/A;  . KNEE SURGERY    . NASAL SINUS SURGERY  1992  . NECK SURGERY    . TONSILLECTOMY  1990     OB History    Gravida  1   Para      Term      Preterm      AB  1   Living  0     SAB      TAB      Ectopic      Multiple      Live Births               Home Medications    Prior to Admission medications   Medication Sig Start Date End Date Taking? Authorizing Provider  albuterol (PROAIR HFA) 108 (90 BASE) MCG/ACT inhaler Inhale 2 puffs into the lungs every 6 (six) hours as needed for wheezing. 03/11/13  Yes Dunn, Raymon Mutton, PA-C  ALPRAZolam (XANAX) 0.25 MG tablet TAKE 1 TABLET AT BEDTIME AS NEEDED FOR ANXIETY. 10/02/17  Yes Harrington Challenger, NP  montelukast (SINGULAIR) 10 MG tablet TAKE ONE TABLET AT BEDTIME. Patient taking differently: TAKE 10 mg once daily 04/22/14  Yes Ethelda Chick, MD  oxyCODONE-acetaminophen (PERCOCET/ROXICET) 5-325 MG tablet Take 1-2 tablets by mouth every 6 (six) hours as needed for moderate pain.  06/20/17  Yes [provider]  venlafaxine XR (EFFEXOR-XR) 37.5 MG 24 hr capsule Take 112.5 mg by mouth daily. 06/17/17  Yes [provider]  dicyclomine (BENTYL) 20 MG tablet Take 1 tablet (20 mg total) by mouth 4 (four) times daily -  before meals and at bedtime. 09/16/17 09/23/17  Shaune Pollack, MD  LOPREEZA 0.5-0.1 MG tablet TAKE 1 TABLET DAILY. Patient not taking: Reported on 02/09/2018 09/26/17   Harrington Challenger, NP  ondansetron (ZOFRAN ODT) 4 MG disintegrating tablet Take 1 tablet (4 mg total) by mouth every 8 (eight) hours as needed for nausea or vomiting. Patient not taking: Reported on 02/09/2018 09/16/17   Shaune Pollack, MD  potassium chloride (K-DUR) 10 MEQ tablet Take 2 tablets (20 mEq total) by mouth 2 (two) times daily. 07/02/17 07/04/17  Alvira Monday, MD    potassium chloride SA (K-DUR,KLOR-CON) 20 MEQ tablet Take 2 tablets (40 mEq total) by mouth daily. 09/16/17 09/19/17  Shaune Pollack, MD    Family History Family History  Problem Relation Age of Onset  . Heart disease Mother   . Hypertension Father   . Diabetes Paternal Grandfather   . Heart disease Paternal Grandfather   . Stroke Maternal Grandmother   . Stroke Maternal Grandfather   . Breast cancer Maternal Aunt        Age 9's    Social History Social History   Tobacco Use  . Smoking status: Current Every Day Smoker    Packs/day: 1.50    Years: 10.00    Pack years: 15.00    Types: Cigarettes  . Smokeless tobacco: Never Used  Substance Use Topics  . Alcohol use: No    Comment: rare  . Drug use: Yes    Types: Marijuana    Comment: yes- will refrain x24 hours     Allergies   Avelox [moxifloxacin hcl in nacl]   Review of Systems Review of Systems  Ten systems reviewed and are negative for acute change, except as noted in the HPI.   Physical Exam Updated Vital Signs BP (!) 151/85   Pulse 85   Temp 97.9 F (36.6 C) (Axillary)   Resp 18   SpO2 100%   Physical Exam  Constitutional: She is oriented to person, place, and time. She appears well-developed. She appears ill.  Diaphoretic. Histrionic behavior.  HENT:  Head: Normocephalic and atraumatic.  Eyes: Pupils are equal, round, and reactive to light. EOM are normal.  Cardiovascular: Normal rate and regular rhythm.  Pulmonary/Chest: Effort normal and breath sounds normal.  Abdominal: Normal appearance, normal aorta and bowel sounds are normal. There is generalized tenderness.  Neurological: She is alert and oriented to person, place, and time.  Skin: She is diaphoretic.  Psychiatric: Her mood appears anxious. She is agitated.  Nursing note and vitals reviewed.    ED Treatments / Results  Labs (all labs ordered are listed, but only abnormal results are displayed) Labs Reviewed  COMPREHENSIVE  METABOLIC PANEL - Abnormal; Notable for the following components:      Result Value   Potassium 2.2 (*)    Chloride 100 (*)    Glucose, Bld 121 (*)    Anion gap 16 (*)    All other components within normal limits  CBC - Abnormal; Notable for the following components:   RBC 5.21 (*)    Hemoglobin 15.6 (*)  Platelets 411 (*)    All other components within normal limits  LIPASE, BLOOD  URINALYSIS, ROUTINE W REFLEX MICROSCOPIC  RAPID URINE DRUG SCREEN, HOSP PERFORMED  MAGNESIUM    EKG None  Radiology No results found.  Procedures .Critical Care Performed by: Arthor CaptainHarris, Pasco Marchitto, PA-C Authorized by: Arthor CaptainHarris, Yolande Skoda, PA-C   Critical care provider statement:    Critical care time (minutes):  45   Critical care was necessary to treat or prevent imminent or life-threatening deterioration of the following conditions: hypokalemia.   Critical care was time spent personally by me on the following activities:  Development of treatment plan with patient or surrogate, discussions with consultants, evaluation of patient's response to treatment, examination of patient, interpretation of cardiac output measurements, ordering and performing treatments and interventions, ordering and review of laboratory studies, ordering and review of radiographic studies, re-evaluation of patient's condition and review of old charts   (including critical care time)  Medications Ordered in ED Medications  haloperidol lactate (HALDOL) injection 5 mg (has no administration in time range)  sodium chloride 0.9 % bolus 1,000 mL (1,000 mLs Intravenous Given 02/09/18 1430)  potassium chloride 10 mEq in 100 mL IVPB (has no administration in time range)  HYDROmorphone (DILAUDID) injection 1 mg (has no administration in time range)     Initial Impression / Assessment and Plan / ED Course  I have reviewed the triage vital signs and the nursing notes.  Pertinent labs & imaging results that were available during my care  of the patient were reviewed by me and considered in my medical decision making (see chart for details).  Clinical Course as of Feb 09 1613  Mon Feb 09, 2018  1447 Patient noted to be hypokalemic . I have ordered 3 runs of IV potassium.  Potassium(!!): 2.2 [AH]    Clinical Course User Index [AH] Arthor CaptainHarris, Jorgina Binning, PA-C    Patient vomiting improved significantly in the emergency department.  Her EKG does not show prolonged QT however she is on the upper limit of normal in the setting of significant hypokalemia.  Although patient is not actively vomiting she is still extremely nauseous and will need multiple rounds of IV potassium for repletion.  She will need observation admission I have discussed this with the hospitalist who is agreed to bring her in. Final Clinical Impressions(s) / ED Diagnoses   Final diagnoses:  Non-intractable cyclical vomiting with nausea  Hypokalemia due to loss of potassium    ED Discharge Orders    None       Arthor CaptainHarris, Roselina Burgueno, PA-C 02/09/18 2251    Pricilla LovelessGoldston, Scott, MD 02/10/18 1933    Arthor CaptainHarris, Aurilla Coulibaly, PA-C 02/17/18 1538    Pricilla LovelessGoldston, Scott, MD 02/18/18 [phone]

## 2018-02-09 NOTE — ED Notes (Signed)
Bed: [facility] Expected date:  Expected time:  Means of arrival:  Comments: 

## 2018-02-09 NOTE — Plan of Care (Signed)
  Problem: Education: Goal: Knowledge of General Education information will improve Outcome: Progressing   Problem: Health Behavior/Discharge Planning: Goal: Ability to manage health-related needs will improve Outcome: Progressing   Problem: Clinical Measurements: Goal: Ability to maintain clinical measurements within normal limits will improve Outcome: Progressing Goal: Will remain free from infection Outcome: Progressing Goal: Diagnostic test results will improve Outcome: Progressing Goal: Respiratory complications will improve Outcome: Progressing Goal: Cardiovascular complication will be avoided Outcome: Progressing   Problem: Nutrition: Goal: Adequate nutrition will be maintained Outcome: Progressing   Problem: Coping: Goal: Level of anxiety will decrease Outcome: Progressing   Problem: Elimination: Goal: Will not experience complications related to bowel motility Outcome: Progressing Goal: Will not experience complications related to urinary retention Outcome: Progressing   Problem: Pain Managment: Goal: General experience of comfort will improve Outcome: Progressing   Problem: Safety: Goal: Ability to remain free from injury will improve Outcome: Progressing   Problem: Skin Integrity: Goal: Risk for impaired skin integrity will decrease Outcome: Progressing   

## 2018-02-09 NOTE — ED Notes (Signed)
ED TO INPATIENT HANDOFF REPORT  Name/Age/Gender Katie Chang 57 y.o. female  Code Status Advance Directive Documentation     Most Recent Value  Type of Advance Directive  Healthcare Power of Attorney, Living will  Pre-existing out of facility DNR order (yellow form or pink MOST form)  -  "MOST" Form in Place?  -      Home/SNF/Other Home  Chief Complaint N/V AbD pain   Level of Care/Admitting Diagnosis ED Disposition    ED Disposition Condition [place] [place] [100102]  Level of Care: Telemetry [5]  Admit to tele based on following criteria: Complex arrhythmia (Bradycardia/Tachycardia)  Diagnosis: Hypokalemia [086578]  Admitting Physician: Charlynne Cousins [[id number]]  Attending Physician: Charlynne Cousins [[id number]]  PT Class (Do Not Modify): Observation [104]  PT Acc Code (Do Not Modify): Observation [10022]       Medical History Past Medical History:  Diagnosis Date  . Allergy   . Arthritis   . Asthma   . Cervical dysplasia   . Cyclic vomiting syndrome   . DDD (degenerative disc disease), cervical    Cervical and Lumbar  . Depression   . Eczema   . GERD (gastroesophageal reflux disease)   . Hypertension   . Menopausal symptoms   . Migraines   . PONV (postoperative nausea and vomiting)    sometimes nausea  . Recurrent sinus infections     Allergies Allergies  Allergen Reactions  . Avelox [Moxifloxacin Hcl In Nacl] Hives    No allergic reaction to Levaquin.    IV Location/Drains/Wounds Patient Lines/Drains/Airways Status   Active Line/Drains/Airways    Name:   Placement date:   Placement time:   Site:   Days:   Peripheral IV 02/09/18 Left Hand   02/09/18    1303    Hand   less than 1          Labs/Imaging Results for orders placed or performed during the hospital encounter of 02/09/18 (from the past 48 hour(s))  Lipase, blood     Status: None   Collection Time: 02/09/18  1:01 PM  Result Value  Ref Range   Lipase 20 11 - 51 U/L    Comment: Performed at [facility], [place] [address], [place], [place] [address]  Comprehensive metabolic panel     Status: Abnormal   Collection Time: 02/09/18  1:01 PM  Result Value Ref Range   Sodium 138 135 - 145 mmol/L   Potassium 2.2 (LL) 3.5 - 5.1 mmol/L    Comment: CRITICAL RESULT CALLED TO, READ BACK BY AND VERIFIED WITH: BRAWNER,A. RN AT 1418 02/09/18 MULLINS,T    Chloride 100 (L) 101 - 111 mmol/L   CO2 22 22 - 32 mmol/L   Glucose, Bld 121 (H) 65 - 99 mg/dL   BUN 11 6 - 20 mg/dL   Creatinine, Ser 0.65 0.44 - 1.00 mg/dL   Calcium 9.4 8.9 - 10.3 mg/dL   Total Protein 7.4 6.5 - 8.1 g/dL   Albumin 4.3 3.5 - 5.0 g/dL   AST 25 15 - 41 U/L   ALT 16 14 - 54 U/L   Alkaline Phosphatase 75 38 - 126 U/L   Total Bilirubin 0.6 0.3 - 1.2 mg/dL   GFR calc non Af Amer >60 >60 mL/min   GFR calc Af Amer >60 >60 mL/min    Comment: (NOTE) The eGFR has been calculated using the CKD EPI equation. This calculation has not  been validated in all clinical situations. eGFR's persistently <60 mL/min signify possible Chronic Kidney Disease.    Anion gap 16 (H) 5 - 15    Comment: Performed at [facility], [place] [address] [place], [place] [address]  CBC     Status: Abnormal   Collection Time: 02/09/18  1:01 PM  Result Value Ref Range   WBC 8.1 4.0 - 10.5 K/uL   RBC 5.21 (H) 3.87 - 5.11 MIL/uL   Hemoglobin 15.6 (H) 12.0 - 15.0 g/dL   HCT 43.9 36.0 - 46.0 %   MCV 84.3 78.0 - 100.0 fL   MCH 29.9 26.0 - 34.0 pg   MCHC 35.5 30.0 - 36.0 g/dL   RDW 13.7 11.5 - 15.5 %   Platelets 411 (H) 150 - 400 K/uL    Comment: Performed at [facility], [place] [address] [place], [place] [address]  Magnesium     Status: None   Collection Time: 02/09/18  1:01 PM  Result Value Ref Range   Magnesium 1.8 1.7 - 2.4 mg/dL    Comment: Performed at [facility], [place] [address] [place], [place] [address]    No results found.  Pending Labs Unresulted Labs (From admission, onward)   Start     Ordered   02/09/18 1711  Respiratory Panel by PCR  (Respiratory virus panel)  Once,   R     02/09/18 1710   02/09/18 1317  Rapid urine drug screen (hospital performed)  STAT,   R     02/09/18 1316   02/09/18 1301  Urinalysis, Routine w reflex microscopic  STAT,   STAT     02/09/18 1301   Signed and Held  Magnesium  Tomorrow morning,   R     Signed and Held   Signed and Held  HIV antibody (Routine Testing)  Once,   R     Signed and Held   Signed and Held  Magnesium  Once,   R     Signed and Held   Signed and Held  CBC  Tomorrow morning,   R     Signed and Held   Signed and Held  Basic metabolic panel  Tomorrow morning,   R     Signed and Held      Vitals/Pain Today's Vitals   02/09/18 1600 02/09/18 1630 02/09/18 1700 02/09/18 1730  BP: (!) 145/92 (!) 159/94 (!) 167/95 (!) 169/97  Pulse: 88 86 88 86  Resp: 15 20 (!) 24 (!) 22  Temp:      TempSrc:      SpO2: 95% 95% 96% 96%  PainSc:        Isolation Precautions Droplet precaution  Medications Medications  potassium chloride 10 mEq in 100 mL IVPB (10 mEq Intravenous New Bag/Given 02/09/18 1703)  haloperidol lactate (HALDOL) injection 5 mg (5 mg Intravenous Given 02/09/18 1440)  sodium chloride 0.9 % bolus 1,000 mL (1,000 mLs Intravenous Given 02/09/18 1430)  HYDROmorphone (DILAUDID) injection 1 mg (1 mg Intravenous Given 02/09/18 1440)    Mobility walks

## 2018-02-09 NOTE — ED Triage Notes (Signed)
Per EMS pt complaint of abdominal pain with n/v, diaphoretic, and chills. Hx of cyclic vomiting.

## 2018-02-09 NOTE — H&P (Signed)
History and Physical    Katie Chang ZOX:[id number] DOB: 08-20-1961 DOA: 02/09/2018  PCP: Charolett Bumpers, MD  Patient coming from: home   Chief Complaint: Nausea and vomiting  HPI: Katie Chang is a 57 y.o. female with medical history significant of past medical history of cyclic vomiting syndrome  for several years and a history of migraines who comes into the hospital with ongoing nausea and vomiting for the last 24 hours unable to tolerate anything by mouth, she denies any abdominal pain, fever, shortness of breath, chest pain.  Not been able to even drink water, cannot take her medications so she decided to come to the ED.  She denies any sick contacts or changes in her medication or new medication.  She does admit to using marijuana  ED Course:  Basic metabolic panel showed hypokalemia with hypochloremia, no leukocytosis with mild thrombocytosis, twelve-lead EKG showed Q waves  Review of Systems: As per HPI otherwise 10 point review of systems negative.   Past Medical History:  Diagnosis Date  . Allergy   . Arthritis   . Asthma   . Cervical dysplasia   . Cyclic vomiting syndrome   . DDD (degenerative disc disease), cervical    Cervical and Lumbar  . Depression   . Eczema   . GERD (gastroesophageal reflux disease)   . Hypertension   . Menopausal symptoms   . Migraines   . PONV (postoperative nausea and vomiting)    sometimes nausea  . Recurrent sinus infections     Past Surgical History:  Procedure Laterality Date  . CERVICAL BIOPSY     Dr. Eda Paschal  . COLONOSCOPY W/ POLYPECTOMY    . COLPOSCOPY    . ESOPHAGOGASTRODUODENOSCOPY (EGD) WITH PROPOFOL N/A 05/13/2016   Procedure: ESOPHAGOGASTRODUODENOSCOPY (EGD) WITH PROPOFOL;  Surgeon: Charolett Bumpers, MD;  Location: [facility];  Service: Endoscopy;  Laterality: N/A;  . KNEE SURGERY    . NASAL SINUS SURGERY  1992  . NECK SURGERY    . TONSILLECTOMY  1990     reports that she has been smoking cigarettes.  She has a  15.00 pack-year smoking history. She has never used smokeless tobacco. She reports that she has current or past drug history. Drug: Marijuana. She reports that she does not drink alcohol.  Allergies  Allergen Reactions  . Avelox [Moxifloxacin Hcl In Nacl] Hives    No allergic reaction to Levaquin.    Family History  Problem Relation Age of Onset  . Heart disease Mother   . Hypertension Father   . Diabetes Paternal Grandfather   . Heart disease Paternal Grandfather   . Stroke Maternal Grandmother   . Stroke Maternal Grandfather   . Breast cancer Maternal Aunt        Age 64's    Prior to Admission medications   Medication Sig Start Date End Date Taking? Authorizing Provider  albuterol (PROAIR HFA) 108 (90 BASE) MCG/ACT inhaler Inhale 2 puffs into the lungs every 6 (six) hours as needed for wheezing. 03/11/13  Yes Dunn, Raymon Mutton, PA-C  ALPRAZolam (XANAX) 0.25 MG tablet TAKE 1 TABLET AT BEDTIME AS NEEDED FOR ANXIETY. 10/02/17  Yes Harrington Challenger, NP  montelukast (SINGULAIR) 10 MG tablet TAKE ONE TABLET AT BEDTIME. Patient taking differently: TAKE 10 mg once daily 04/22/14  Yes Ethelda Chick, MD  oxyCODONE-acetaminophen (PERCOCET/ROXICET) 5-325 MG tablet Take 1-2 tablets by mouth every 6 (six) hours as needed for moderate pain.  06/20/17  Yes [provider]  venlafaxine  XR (EFFEXOR-XR) 37.5 MG 24 hr capsule Take 112.5 mg by mouth daily. 06/17/17  Yes [provider]  dicyclomine (BENTYL) 20 MG tablet Take 1 tablet (20 mg total) by mouth 4 (four) times daily -  before meals and at bedtime. 09/16/17 09/23/17  Shaune PollackIsaacs, Cameron, MD  LOPREEZA 0.5-0.1 MG tablet TAKE 1 TABLET DAILY. Patient not taking: Reported on 02/09/2018 09/26/17   Harrington ChallengerYoung, Nancy J, NP  ondansetron (ZOFRAN ODT) 4 MG disintegrating tablet Take 1 tablet (4 mg total) by mouth every 8 (eight) hours as needed for nausea or vomiting. Patient not taking: Reported on 02/09/2018 09/16/17   Shaune PollackIsaacs, Cameron, MD  potassium  chloride (K-DUR) 10 MEQ tablet Take 2 tablets (20 mEq total) by mouth 2 (two) times daily. 07/02/17 07/04/17  Alvira MondaySchlossman, Erin, MD  potassium chloride SA (K-DUR,KLOR-CON) 20 MEQ tablet Take 2 tablets (40 mEq total) by mouth daily. 09/16/17 09/19/17  Shaune PollackIsaacs, Cameron, MD    Physical Exam: Vitals:   02/09/18 1330 02/09/18 1400 02/09/18 1430 02/09/18 1600  BP: (!) 151/85 (!) 147/85 (!) 157/93 (!) 145/92  Pulse: 85 66  88  Resp: 18 (!) 23 17 15   Temp:      TempSrc:      SpO2: 100% 100%  95%    Constitutional: NAD, calm, comfortable Vitals:   02/09/18 1330 02/09/18 1400 02/09/18 1430 02/09/18 1600  BP: (!) 151/85 (!) 147/85 (!) 157/93 (!) 145/92  Pulse: 85 66  88  Resp: 18 (!) 23 17 15   Temp:      TempSrc:      SpO2: 100% 100%  95%   Eyes: PERRL, lids and conjunctivae normal ENMT: Mucous membranes are moist. Posterior pharynx clear of any exudate or lesions.Normal dentition.  Neck: normal, supple, no masses, no thyromegaly Respiratory: clear to auscultation bilaterally, no wheezing, no crackles. Normal respiratory effort. No accessory muscle use.  Cardiovascular: Regular rate and rhythm, no murmurs / rubs / gallops. No extremity edema. 2+ pedal pulses. No carotid bruits.  Abdomen: no tenderness, no masses palpated. No hepatosplenomegaly. Bowel sounds positive.  Musculoskeletal: no clubbing / cyanosis. No joint deformity upper and lower extremities. Good ROM, no contractures. Normal muscle tone.  Skin: no rashes, lesions, ulcers. No induration Neurologic: CN 2-12 grossly intact. Sensation intact, DTR normal. Strength 5/5 in all 4.  Psychiatric: Normal judgment and insight. Alert and oriented x 3. Normal mood.     Labs on Admission: I have personally reviewed following labs and imaging studies  CBC: Recent Labs  Lab 02/09/18 1301  WBC 8.1  HGB 15.6*  HCT 43.9  MCV 84.3  PLT 411*   Basic Metabolic Panel: Recent Labs  Lab 02/09/18 1301  NA 138  K 2.2*  CL 100*  CO2 22    GLUCOSE 121*  BUN 11  CREATININE 0.65  CALCIUM 9.4  MG 1.8   GFR: CrCl cannot be calculated (Unknown ideal weight.). Liver Function Tests: Recent Labs  Lab 02/09/18 1301  AST 25  ALT 16  ALKPHOS 75  BILITOT 0.6  PROT 7.4  ALBUMIN 4.3   Recent Labs  Lab 02/09/18 1301  LIPASE 20   No results for input(s): AMMONIA in the last 168 hours. Coagulation Profile: No results for input(s): INR, PROTIME in the last 168 hours. Cardiac Enzymes: No results for input(s): CKTOTAL, CKMB, CKMBINDEX, TROPONINI in the last 168 hours. BNP (last 3 results) No results for input(s): PROBNP in the last 8760 hours. HbA1C: No results for input(s): HGBA1C in the last 72  hours. CBG: No results for input(s): GLUCAP in the last 168 hours. Lipid Profile: No results for input(s): CHOL, HDL, LDLCALC, TRIG, CHOLHDL, LDLDIRECT in the last 72 hours. Thyroid Function Tests: No results for input(s): TSH, T4TOTAL, FREET4, T3FREE, THYROIDAB in the last 72 hours. Anemia Panel: No results for input(s): VITAMINB12, FOLATE, FERRITIN, TIBC, IRON, RETICCTPCT in the last 72 hours. Urine analysis:    Component Value Date/Time   COLORURINE YELLOW 09/16/2017 1549   APPEARANCEUR CLOUDY (A) 09/16/2017 1549   LABSPEC 1.012 09/16/2017 1549   PHURINE 9.0 (H) 09/16/2017 1549   GLUCOSEU NEGATIVE 09/16/2017 1549   HGBUR NEGATIVE 09/16/2017 1549   BILIRUBINUR NEGATIVE 09/16/2017 1549   KETONESUR 5 (A) 09/16/2017 1549   PROTEINUR NEGATIVE 09/16/2017 1549   UROBILINOGEN 0.2 12/29/2014 1357   NITRITE NEGATIVE 09/16/2017 1549   LEUKOCYTESUR NEGATIVE 09/16/2017 1549    Radiological Exams on Admission: No results found.  EKG: Independently reviewed.  Sinus rhythm normal axis LVH with Q waves nonspecific T wave changes.  Assessment/Plan Severe Hypokalemia: Replete potassium IV, will change her fluids to normal saline with potassium. Magnesium IV and will recheck in the morning. Is likely due to vomiting.    Possible Cyclic vomiting syndrome: Possibly cannabis induced that she admits to using marijuana. We will check a UDS.  Will use Zofran for nausea. Her QTC is less than 480.  She got Haldol in the ED which helped with the nausea. Clear liquid diet.  Depression Continue current home meds.  Dehydration Hydrate aggressively check a basic metabolic panel in the morning.   DVT prophylaxis: SCd Code Status: full Family Communication: none Disposition Plan: home in am Consults called: none Admission status: observation   Marinda Elk MD [facility] Pager [phone]  If 7PM-7AM, please contact night-coverage [email or web address] Password [email or web address]  02/09/2018, 5:18 PM

## 2018-02-10 DIAGNOSIS — G43A0 Cyclical vomiting, not intractable: Secondary | ICD-10-CM | POA: Diagnosis not present

## 2018-02-10 LAB — RESPIRATORY PANEL BY PCR
Adenovirus: NOT DETECTED
Bordetella pertussis: NOT DETECTED
CORONAVIRUS 229E-RVPPCR: NOT DETECTED
CORONAVIRUS HKU1-RVPPCR: NOT DETECTED
CORONAVIRUS NL63-RVPPCR: NOT DETECTED
CORONAVIRUS OC43-RVPPCR: NOT DETECTED
Chlamydophila pneumoniae: NOT DETECTED
Influenza A: NOT DETECTED
Influenza B: NOT DETECTED
METAPNEUMOVIRUS-RVPPCR: NOT DETECTED
Mycoplasma pneumoniae: NOT DETECTED
PARAINFLUENZA VIRUS 1-RVPPCR: NOT DETECTED
PARAINFLUENZA VIRUS 2-RVPPCR: NOT DETECTED
Parainfluenza Virus 3: NOT DETECTED
Parainfluenza Virus 4: NOT DETECTED
Respiratory Syncytial Virus: NOT DETECTED
Rhinovirus / Enterovirus: NOT DETECTED

## 2018-02-10 LAB — CBC
HCT: 39.6 % (ref 36.0–46.0)
Hemoglobin: 13.9 g/dL (ref 12.0–15.0)
MCH: 29.5 pg (ref 26.0–34.0)
MCHC: 35.1 g/dL (ref 30.0–36.0)
MCV: 84.1 fL (ref 78.0–100.0)
PLATELETS: 405 10*3/uL — AB (ref 150–400)
RBC: 4.71 MIL/uL (ref 3.87–5.11)
RDW: 13.8 % (ref 11.5–15.5)
WBC: 13.9 10*3/uL — ABNORMAL HIGH (ref 4.0–10.5)

## 2018-02-10 LAB — BASIC METABOLIC PANEL
Anion gap: 12 (ref 5–15)
BUN: 9 mg/dL (ref 6–20)
CO2: 26 mmol/L (ref 22–32)
Calcium: 9 mg/dL (ref 8.9–10.3)
Chloride: 95 mmol/L — ABNORMAL LOW (ref 101–111)
Creatinine, Ser: 0.58 mg/dL (ref 0.44–1.00)
GFR calc Af Amer: >60 mL/min (ref >60)
GLUCOSE: 143 mg/dL — AB (ref 65–99)
POTASSIUM: 2.5 mmol/L — AB (ref 3.5–5.1)
SODIUM: 133 mmol/L — AB (ref 135–145)

## 2018-02-10 LAB — MAGNESIUM: Magnesium: 2.3 mg/dL (ref 1.7–2.4)

## 2018-02-10 LAB — POTASSIUM: Potassium: 2.9 mmol/L — ABNORMAL LOW (ref 3.5–5.1)

## 2018-02-10 MED ORDER — POTASSIUM CHLORIDE ER 10 MEQ PO TBCR: 30.0000 meq | EXTENDED_RELEASE_TABLET | Freq: Every day | ORAL | 0 refills | Status: DC

## 2018-02-10 MED ORDER — POTASSIUM CHLORIDE CRYS ER 20 MEQ PO TBCR
30.0000 meq | EXTENDED_RELEASE_TABLET | ORAL | Status: AC
  Administered 2018-02-10 (×2): 30 meq via ORAL
  Filled 2018-02-10 (×2): qty 1

## 2018-02-10 NOTE — Discharge Instructions (Signed)
Nausea and Vomiting, Adult Feeling sick to your stomach (nausea) means that your stomach is upset or you feel like you have to throw up (vomit). Feeling more and more sick to your stomach can lead to throwing up. Throwing up happens when food and liquid from your stomach are thrown up and out the mouth. Throwing up can make you feel weak and cause you to get dehydrated. Dehydration can make you tired and thirsty, make you have a dry mouth, and make it so you pee (urinate) less often. Older adults and people with other diseases or a weak defense system (immune system) are at higher risk for dehydration. If you feel sick to your stomach or if you throw up, it is important to follow instructions from your doctor about how to take care of yourself. Follow these instructions at home: Eating and drinking Follow these instructions as told by your doctor:  Take an oral rehydration solution (ORS). This is a drink that is sold at pharmacies and stores.  Drink clear fluids in small amounts as you are able, such as: ? Water. ? Ice chips. ? Diluted fruit juice. ? Low-calorie sports drinks.  Eat bland, easy-to-digest foods in small amounts as you are able, such as: ? Bananas. ? Applesauce. ? Rice. ? Low-fat (lean) meats. ? Toast. ? Crackers.  Avoid fluids that have a lot of sugar or caffeine in them.  Avoid alcohol.  Avoid spicy or fatty foods.  General instructions  Drink enough fluid to keep your pee (urine) clear or pale yellow.  Wash your hands often. If you cannot use soap and water, use hand sanitizer.  Make sure that all people in your home wash their hands well and often.  Take over-the-counter and prescription medicines only as told by your doctor.  Rest at home while you get better.  Watch your condition for any changes.  Breathe slowly and deeply when you feel sick to your stomach.  Keep all follow-up visits as told by your doctor. This is important. Contact a doctor  if:  You have a fever.  You cannot keep fluids down.  Your symptoms get worse.  You have new symptoms.  You feel sick to your stomach for more than two days.  You feel light-headed or dizzy.  You have a headache.  You have muscle cramps. Get help right away if:  You have pain in your chest, neck, arm, or jaw.  You feel very weak or you pass out (faint).  You throw up again and again.  You see blood in your throw-up.  Your throw-up looks like black coffee grounds.  You have bloody or black poop (stools) or poop that look like tar.  You have a very bad headache, a stiff neck, or both.  You have a rash.  You have very bad pain, cramping, or bloating in your belly (abdomen).  You have trouble breathing.  You are breathing very quickly.  Your heart is beating very quickly.  Your skin feels cold and clammy.  You feel confused.  You have pain when you pee.  You have signs of dehydration, such as: ? Dark pee, hardly any pee, or no pee. ? Cracked lips. ? Dry mouth. ? Sunken eyes. ? Sleepiness. ? Weakness. These symptoms may be an emergency. Do not wait to see if the symptoms will go away. Get medical help right away. Call your local emergency services (911 in the [place] Do not drive yourself to the hospital. This information is   not intended to replace advice given to you by your health care provider. Make sure you discuss any questions you have with your health care provider. Document Released: 04/22/2008 Document Revised: 05/24/2016 Document Reviewed: 07/11/2015 Elsevier Interactive Patient Education  2018 [employer]vier Inc[employer]  

## 2018-02-10 NOTE — Discharge Summary (Addendum)
Physician Discharge Summary  Katie Chang ZOX:[id number] DOB: 10-23-1961 DOA: 02/09/2018  PCP: Charolett Bumpers, MD  Admit date: 02/09/2018 Discharge date: 02/10/2018  Admitted From: Home Disposition: Home  Recommendations for Outpatient Follow-up:  1. Follow up with PCP in 1-2 weeks 2. Please obtain BMP/CBC in 2-3 days   Home Health: No Equipment/Devices: None  Discharge Condition: Stable CODE STATUS: Full Diet recommendation: Regular  Brief/Interim Summary:  #) Cyclic vomiting syndrome: Patient came in with a flareup of chronic vomiting disorder.  She has some sort of functional vomiting disorder of unclear etiology however thus far it is been called cyclic vomiting syndrome.  She currently has had it since she was 17 making this unlikely diagnosis.  She also has migraine suggesting that abdominal migraine/cyclic vomiting syndrome overlap is a likely reason.  She was given antiemetics and IV fluids with resolution of her symptoms.  She tolerated a clear liquid diet prior to discharge.  Patient was continued on home dicyclomine.  #) Hypokalemia: Patient developed mild electrolyte abnormalities secondary to her persistent nausea, vomiting, poor p.o. intake.  Her potassium was aggressively repleted.  On discharge her potassium was 2.9.  She had no evidence of ST segment changes on telemetry.  She was discharged home to restart her home potassium supplementation and told to recheck by her PCP within the next 2-3 days.  #) Psych/pain: Patient was continued on home venlafaxine extended release 112.5 mg daily, as needed Percocets and alprazolam.  #) Mild intermittent asthma/allergies: Patient was maintained on montelukast 10 mg nightly.  #) Chronic marijuana use: Discussed with patient on the differential was hyperemesis syndrome however she has had this cyclic vomiting since she was 17 for long before she smoked cannabis.  Informed her that chronic daily smoking of cannabis was unknown effect  particularly with her already underlying functional disorder.  She agreed to talk with her PCP about this.  Discharge Diagnoses:  Active Problems:   Depression   Hypokalemia   Possible Cyclic vomiting syndrome   Dehydration    Discharge Instructions   Allergies as of 02/10/2018      Reactions   Avelox [moxifloxacin Hcl In Nacl] Hives   No allergic reaction to Levaquin.      Medication List    TAKE these medications   albuterol 108 (90 Base) MCG/ACT inhaler Commonly known as:  PROAIR HFA Inhale 2 puffs into the lungs every 6 (six) hours as needed for wheezing.   ALPRAZolam 0.25 MG tablet Commonly known as:  XANAX TAKE 1 TABLET AT BEDTIME AS NEEDED FOR ANXIETY.   dicyclomine 20 MG tablet Commonly known as:  BENTYL Take 1 tablet (20 mg total) by mouth 4 (four) times daily -  before meals and at bedtime.   LOPREEZA 0.5-0.1 MG tablet Generic drug:  Estradiol-Norethindrone Acet TAKE 1 TABLET DAILY.   montelukast 10 MG tablet Commonly known as:  SINGULAIR TAKE ONE TABLET AT BEDTIME. What changed:    how much to take  how to take this  when to take this   ondansetron 4 MG disintegrating tablet Commonly known as:  ZOFRAN ODT Take 1 tablet (4 mg total) by mouth every 8 (eight) hours as needed for nausea or vomiting.   oxyCODONE-acetaminophen 5-325 MG tablet Commonly known as:  PERCOCET/ROXICET Take 1-2 tablets by mouth every 6 (six) hours as needed for moderate pain.   potassium chloride 10 MEQ tablet Commonly known as:  K-DUR Take 2 tablets (20 mEq total) by mouth 2 (two) times daily. What  changed:  Another medication with the same name was added. Make sure you understand how and when to take each.   potassium chloride 10 MEQ tablet Commonly known as:  K-DUR Take 3 tablets (30 mEq total) by mouth daily for 5 days. What changed:  You were already taking a medication with the same name, and this prescription was added. Make sure you understand how and when to  take each.   potassium chloride SA 20 MEQ tablet Commonly known as:  K-DUR,KLOR-CON Take 2 tablets (40 mEq total) by mouth daily.   venlafaxine XR 37.5 MG 24 hr capsule Commonly known as:  EFFEXOR-XR Take 112.5 mg by mouth daily.       Allergies  Allergen Reactions  . Avelox [Moxifloxacin Hcl In Nacl] Hives    No allergic reaction to Levaquin.    Consultations:  None   Procedures/Studies: No results found.    Subjective:   Discharge Exam: Vitals:   02/10/18 0604 02/10/18 1241  BP: (!) 106/55 135/86  Pulse: 98 82  Resp: 20   Temp: 99 F (37.2 C)   SpO2: 97% 100%   Vitals:   02/09/18 2026 02/09/18 2120 02/10/18 0604 02/10/18 1241  BP: (!) 177/103 (!) 159/89 (!) 106/55 135/86  Pulse: 92  98 82  Resp: 18  20   Temp: 99.1 F (37.3 C)  99 F (37.2 C)   TempSrc: Oral  Oral   SpO2: 98%  97% 100%  Weight:      Height:        General: Pt is alert, awake, not in acute distress Cardiovascular: RRR, S1/S2 +, no rubs, no gallops Respiratory: CTA bilaterally, no wheezing, no rhonchi Abdominal: Soft, NT, ND, bowel sounds + Extremities: no edema    The results of significant diagnostics from this hospitalization (including imaging, microbiology, ancillary and laboratory) are listed below for reference.     Microbiology: Recent Results (from the past 240 hour(s))  Respiratory Panel by PCR     Status: None   Collection Time: 02/09/18  6:03 PM  Result Value Ref Range Status   Adenovirus NOT DETECTED NOT DETECTED Final   Coronavirus 229E NOT DETECTED NOT DETECTED Final   Coronavirus HKU1 NOT DETECTED NOT DETECTED Final   Coronavirus NL63 NOT DETECTED NOT DETECTED Final   Coronavirus OC43 NOT DETECTED NOT DETECTED Final   Metapneumovirus NOT DETECTED NOT DETECTED Final   Rhinovirus / Enterovirus NOT DETECTED NOT DETECTED Final   Influenza A NOT DETECTED NOT DETECTED Final   Influenza B NOT DETECTED NOT DETECTED Final   Parainfluenza Virus 1 NOT DETECTED NOT  DETECTED Final   Parainfluenza Virus 2 NOT DETECTED NOT DETECTED Final   Parainfluenza Virus 3 NOT DETECTED NOT DETECTED Final   Parainfluenza Virus 4 NOT DETECTED NOT DETECTED Final   Respiratory Syncytial Virus NOT DETECTED NOT DETECTED Final   Bordetella pertussis NOT DETECTED NOT DETECTED Final   Chlamydophila pneumoniae NOT DETECTED NOT DETECTED Final   Mycoplasma pneumoniae NOT DETECTED NOT DETECTED Final    Comment: Performed at [facility], [address] [place], [place] [address]     Labs: BNP (last 3 results) No results for input(s): BNP in the last 8760 hours. Basic Metabolic Panel: Recent Labs  Lab 02/09/18 1301 02/09/18 2043 02/10/18 0554 02/10/18 1401  NA 138  --  133*  --   K 2.2*  --  2.5* 2.9*  CL 100*  --  95*  --   CO2 22  --  26  --   GLUCOSE 121*  --  143*  --   BUN 11  --  9  --   CREATININE 0.65  --  0.58  --   CALCIUM 9.4  --  9.0  --   MG 1.8 2.9* 2.3  --    Liver Function Tests: Recent Labs  Lab 02/09/18 1301  AST 25  ALT 16  ALKPHOS 75  BILITOT 0.6  PROT 7.4  ALBUMIN 4.3   Recent Labs  Lab 02/09/18 1301  LIPASE 20   No results for input(s): AMMONIA in the last 168 hours. CBC: Recent Labs  Lab 02/09/18 1301 02/10/18 0554  WBC 8.1 13.9*  HGB 15.6* 13.9  HCT 43.9 39.6  MCV 84.3 84.1  PLT 411* 405*   Cardiac Enzymes: No results for input(s): CKTOTAL, CKMB, CKMBINDEX, TROPONINI in the last 168 hours. BNP: Invalid input(s): POCBNP CBG: No results for input(s): GLUCAP in the last 168 hours. D-Dimer No results for input(s): DDIMER in the last 72 hours. Hgb A1c No results for input(s): HGBA1C in the last 72 hours. Lipid Profile No results for input(s): CHOL, HDL, LDLCALC, TRIG, CHOLHDL, LDLDIRECT in the last 72 hours. Thyroid function studies No results for input(s): TSH, T4TOTAL, T3FREE, THYROIDAB in the last 72 hours.  Invalid input(s): FREET3 Anemia work up No results for input(s): VITAMINB12, FOLATE, FERRITIN,  TIBC, IRON, RETICCTPCT in the last 72 hours. Urinalysis    Component Value Date/Time   COLORURINE YELLOW 02/09/2018 1803   APPEARANCEUR CLEAR 02/09/2018 1803   LABSPEC 1.011 02/09/2018 1803   PHURINE 8.0 02/09/2018 1803   GLUCOSEU NEGATIVE 02/09/2018 1803   HGBUR NEGATIVE 02/09/2018 1803   BILIRUBINUR NEGATIVE 02/09/2018 1803   KETONESUR 20 (A) 02/09/2018 1803   PROTEINUR NEGATIVE 02/09/2018 1803   UROBILINOGEN 0.2 12/29/2014 1357   NITRITE NEGATIVE 02/09/2018 1803   LEUKOCYTESUR NEGATIVE 02/09/2018 1803   Sepsis Labs Invalid input(s): PROCALCITONIN,  WBC,  LACTICIDVEN Microbiology Recent Results (from the past 240 hour(s))  Respiratory Panel by PCR     Status: None   Collection Time: 02/09/18  6:03 PM  Result Value Ref Range Status   Adenovirus NOT DETECTED NOT DETECTED Final   Coronavirus 229E NOT DETECTED NOT DETECTED Final   Coronavirus HKU1 NOT DETECTED NOT DETECTED Final   Coronavirus NL63 NOT DETECTED NOT DETECTED Final   Coronavirus OC43 NOT DETECTED NOT DETECTED Final   Metapneumovirus NOT DETECTED NOT DETECTED Final   Rhinovirus / Enterovirus NOT DETECTED NOT DETECTED Final   Influenza A NOT DETECTED NOT DETECTED Final   Influenza B NOT DETECTED NOT DETECTED Final   Parainfluenza Virus 1 NOT DETECTED NOT DETECTED Final   Parainfluenza Virus 2 NOT DETECTED NOT DETECTED Final   Parainfluenza Virus 3 NOT DETECTED NOT DETECTED Final   Parainfluenza Virus 4 NOT DETECTED NOT DETECTED Final   Respiratory Syncytial Virus NOT DETECTED NOT DETECTED Final   Bordetella pertussis NOT DETECTED NOT DETECTED Final   Chlamydophila pneumoniae NOT DETECTED NOT DETECTED Final   Mycoplasma pneumoniae NOT DETECTED NOT DETECTED Final    Comment: Performed at [facility], [address], [place], [place] [address]     Time coordinating discharge: Over 30 minutes  SIGNED:   Delaine Lame, MD  [facility] 02/10/2018, 2:45 PM   If 7PM-7AM, please contact  night-coverage [email or web address] Password [email or web address]

## 2018-02-10 NOTE — Progress Notes (Addendum)
CRITICAL VALUE ALERT  Critical Value:  K 2.5  Date & Time Notied:  02/10/18 [id number]  Provider Notified: Craige CottaKirby NP  Orders Received/Actions taken: PO K ordered

## 2018-02-11 LAB — HIV ANTIBODY (ROUTINE TESTING W REFLEX): HIV Screen 4th Generation wRfx: NONREACTIVE

## 2018-02-18 DIAGNOSIS — D72829 Elevated white blood cell count, unspecified: Secondary | ICD-10-CM | POA: Diagnosis not present

## 2018-02-19 DIAGNOSIS — G43D1 Abdominal migraine, intractable: Secondary | ICD-10-CM | POA: Diagnosis not present

## 2018-02-19 DIAGNOSIS — G43909 Migraine, unspecified, not intractable, without status migrainosus: Secondary | ICD-10-CM | POA: Diagnosis not present

## 2018-02-19 DIAGNOSIS — G43A0 Cyclical vomiting, not intractable: Secondary | ICD-10-CM | POA: Diagnosis not present

## 2018-02-19 DIAGNOSIS — Z720 Tobacco use: Secondary | ICD-10-CM | POA: Diagnosis not present

## 2018-03-17 DIAGNOSIS — H1045 Other chronic allergic conjunctivitis: Secondary | ICD-10-CM | POA: Diagnosis not present

## 2018-03-17 DIAGNOSIS — J300 Vasomotor rhinitis: Secondary | ICD-10-CM | POA: Diagnosis not present

## 2018-03-17 DIAGNOSIS — J4520 Mild intermittent asthma, uncomplicated: Secondary | ICD-10-CM | POA: Diagnosis not present

## 2018-03-17 DIAGNOSIS — K219 Gastro-esophageal reflux disease without esophagitis: Secondary | ICD-10-CM | POA: Diagnosis not present

## 2018-03-25 DIAGNOSIS — F12980 Cannabis use, unspecified with anxiety disorder: Secondary | ICD-10-CM | POA: Diagnosis not present

## 2018-03-25 DIAGNOSIS — Z720 Tobacco use: Secondary | ICD-10-CM | POA: Diagnosis not present

## 2018-03-25 DIAGNOSIS — F419 Anxiety disorder, unspecified: Secondary | ICD-10-CM | POA: Diagnosis not present

## 2018-03-25 DIAGNOSIS — M545 Low back pain: Secondary | ICD-10-CM | POA: Diagnosis not present

## 2018-04-10 DIAGNOSIS — M47816 Spondylosis without myelopathy or radiculopathy, lumbar region: Secondary | ICD-10-CM | POA: Diagnosis not present

## 2018-04-10 DIAGNOSIS — Z6825 Body mass index (BMI) 25.0-25.9, adult: Secondary | ICD-10-CM | POA: Diagnosis not present

## 2018-04-10 DIAGNOSIS — M5020 Other cervical disc displacement, unspecified cervical region: Secondary | ICD-10-CM | POA: Diagnosis not present

## 2018-04-12 ENCOUNTER — Emergency Department (HOSPITAL_COMMUNITY)
Admission: EM | Admit: 2018-04-12 | Discharge: 2018-04-12 | Disposition: A | Discharge status: Home / Self Care | Payer: BLUE CROSS/BLUE SHIELD | Attending: Emergency Medicine | Admitting: Emergency Medicine

## 2018-04-12 ENCOUNTER — Encounter (HOSPITAL_COMMUNITY): Payer: Self-pay | Admitting: Emergency Medicine

## 2018-04-12 DIAGNOSIS — Z79899 Other long term (current) drug therapy: Secondary | ICD-10-CM | POA: Insufficient documentation

## 2018-04-12 DIAGNOSIS — R1115 Cyclical vomiting syndrome unrelated to migraine: Secondary | ICD-10-CM

## 2018-04-12 DIAGNOSIS — I10 Essential (primary) hypertension: Secondary | ICD-10-CM | POA: Diagnosis not present

## 2018-04-12 DIAGNOSIS — R112 Nausea with vomiting, unspecified: Secondary | ICD-10-CM | POA: Diagnosis not present

## 2018-04-12 DIAGNOSIS — R110 Nausea: Secondary | ICD-10-CM | POA: Diagnosis not present

## 2018-04-12 DIAGNOSIS — F17210 Nicotine dependence, cigarettes, uncomplicated: Secondary | ICD-10-CM | POA: Diagnosis not present

## 2018-04-12 DIAGNOSIS — J45909 Unspecified asthma, uncomplicated: Secondary | ICD-10-CM | POA: Insufficient documentation

## 2018-04-12 DIAGNOSIS — G43A0 Cyclical vomiting, not intractable: Secondary | ICD-10-CM | POA: Insufficient documentation

## 2018-04-12 LAB — LIPASE, BLOOD: Lipase: 20 U/L (ref 11–51)

## 2018-04-12 LAB — CBC
HCT: 43.2 % (ref 36.0–46.0)
Hemoglobin: 15.6 g/dL — ABNORMAL HIGH (ref 12.0–15.0)
MCH: 29.9 pg (ref 26.0–34.0)
MCHC: 36.1 g/dL — ABNORMAL HIGH (ref 30.0–36.0)
MCV: 82.8 fL (ref 78.0–100.0)
PLATELETS: 507 10*3/uL — AB (ref 150–400)
RBC: 5.22 MIL/uL — ABNORMAL HIGH (ref 3.87–5.11)
RDW: 13.3 % (ref 11.5–15.5)
WBC: 11.7 10*3/uL — AB (ref 4.0–10.5)

## 2018-04-12 LAB — COMPREHENSIVE METABOLIC PANEL
ALT: 16 U/L (ref 14–54)
AST: 29 U/L (ref 15–41)
Albumin: 4.8 g/dL (ref 3.5–5.0)
Alkaline Phosphatase: 77 U/L (ref 38–126)
Anion gap: 19 — ABNORMAL HIGH (ref 5–15)
BILIRUBIN TOTAL: 1 mg/dL (ref 0.3–1.2)
BUN: 12 mg/dL (ref 6–20)
CO2: 21 mmol/L — ABNORMAL LOW (ref 22–32)
CREATININE: 0.69 mg/dL (ref 0.44–1.00)
Calcium: 10.1 mg/dL (ref 8.9–10.3)
Chloride: 91 mmol/L — ABNORMAL LOW (ref 101–111)
Glucose, Bld: 154 mg/dL — ABNORMAL HIGH (ref 65–99)
POTASSIUM: 2.9 mmol/L — AB (ref 3.5–5.1)
Sodium: 131 mmol/L — ABNORMAL LOW (ref 135–145)
Total Protein: 8.4 g/dL — ABNORMAL HIGH (ref 6.5–8.1)

## 2018-04-12 MED ORDER — SODIUM CHLORIDE 0.9 % IV BOLUS
1000.0000 mL | Freq: Once | INTRAVENOUS | Status: AC
  Administered 2018-04-12: 1000 mL via INTRAVENOUS

## 2018-04-12 MED ORDER — POTASSIUM CHLORIDE CRYS ER 20 MEQ PO TBCR
60.0000 meq | EXTENDED_RELEASE_TABLET | Freq: Once | ORAL | Status: AC
  Administered 2018-04-12: 60 meq via ORAL
  Filled 2018-04-12: qty 3

## 2018-04-12 MED ORDER — HYDROMORPHONE HCL 1 MG/ML IJ SOLN
0.5000 mg | Freq: Once | INTRAMUSCULAR | Status: AC
  Administered 2018-04-12: 0.5 mg via INTRAVENOUS
  Filled 2018-04-12: qty 1

## 2018-04-12 MED ORDER — POTASSIUM CHLORIDE 10 MEQ/100ML IV SOLN
10.0000 meq | Freq: Once | INTRAVENOUS | Status: AC
  Administered 2018-04-12: 10 meq via INTRAVENOUS
  Filled 2018-04-12: qty 100

## 2018-04-12 MED ORDER — POTASSIUM CHLORIDE ER 10 MEQ PO TBCR: 20.0000 meq | EXTENDED_RELEASE_TABLET | Freq: Two times a day (BID) | ORAL | 0 refills | Status: DC

## 2018-04-12 MED ORDER — ONDANSETRON HCL 4 MG/2ML IJ SOLN
4.0000 mg | Freq: Once | INTRAMUSCULAR | Status: AC
Start: 2018-04-12 — End: 2018-04-12
  Administered 2018-04-12: 4 mg via INTRAVENOUS
  Filled 2018-04-12: qty 2

## 2018-04-12 MED ORDER — HALOPERIDOL LACTATE 5 MG/ML IJ SOLN
5.0000 mg | Freq: Once | INTRAMUSCULAR | Status: AC
  Administered 2018-04-12: 5 mg via INTRAVENOUS
  Filled 2018-04-12: qty 1

## 2018-04-12 MED ORDER — ONDANSETRON 4 MG PO TBDP
4.0000 mg | ORAL_TABLET | Freq: Once | ORAL | Status: AC | PRN
  Administered 2018-04-12: 4 mg via ORAL
  Filled 2018-04-12: qty 1

## 2018-04-12 MED ORDER — HYDROMORPHONE HCL 1 MG/ML IJ SOLN
1.0000 mg | Freq: Once | INTRAMUSCULAR | Status: AC
  Administered 2018-04-12: 1 mg via INTRAVENOUS
  Filled 2018-04-12: qty 1

## 2018-04-12 NOTE — ED Provider Notes (Signed)
[facility]-EMERGENCY DEPT Provider Note   CSN: [id number] Arrival date & time: 04/12/18  1008     History   Chief Complaint Chief Complaint  Patient presents with  . Emesis  . Abdominal Pain    HPI Kamy Poinsett is a 57 y.o. female.  The history is provided by the patient and medical records. No language interpreter was used.  Emesis   Associated symptoms include abdominal pain. Pertinent negatives include no diarrhea.  Abdominal Pain   Associated symptoms include nausea and vomiting. Pertinent negatives include diarrhea and constipation.   Brenlynn Fake is a 57 y.o. female  with a PMH of cyclical vomiting who presents to the Emergency Department complaining of acute onset of nausea and vomiting which began this morning around 6 AM.  Associated with diffuse abdominal pain.  Symptoms similar to previous episodes of cyclical vomiting.  She denies taking any medications prior to arrival for her symptoms.  No alleviating or aggravating factors noted.  No fever, chills, diarrhea. Patient does have letter from her PCP stating that she does indeed have history of cyclic vomiting syndrome, typically with about one episode a year.  He notes that she does not have a history of drug-seeking and recommends specific therapy of IV Zofran, IV hydration and Dilaudid.    Past Medical History:  Diagnosis Date  . Allergy   . Arthritis   . Asthma   . Cervical dysplasia   . Cyclic vomiting syndrome   . DDD (degenerative disc disease), cervical    Cervical and Lumbar  . Depression   . Eczema   . GERD (gastroesophageal reflux disease)   . Hypertension   . Menopausal symptoms   . Migraines   . PONV (postoperative nausea and vomiting)    sometimes nausea  . Recurrent sinus infections     Patient Active Problem List   Diagnosis Date Noted  . Hypokalemia 02/09/2018  . Possible Cyclic vomiting syndrome 02/09/2018  . Dehydration 02/09/2018  . Depression   . Asthma   .  Eczema   . Cervical dysplasia   . Menopausal symptoms   . Migraines     Past Surgical History:  Procedure Laterality Date  . CERVICAL BIOPSY     Dr. Eda Paschal  . COLONOSCOPY W/ POLYPECTOMY    . COLPOSCOPY    . ESOPHAGOGASTRODUODENOSCOPY (EGD) WITH PROPOFOL N/A 05/13/2016   Procedure: ESOPHAGOGASTRODUODENOSCOPY (EGD) WITH PROPOFOL;  Surgeon: Charolett Bumpers, MD;  Location: [facility];  Service: Endoscopy;  Laterality: N/A;  . KNEE SURGERY    . NASAL SINUS SURGERY  1992  . NECK SURGERY    . TONSILLECTOMY  1990     OB History    Gravida  1   Para      Term      Preterm      AB  1   Living  0     SAB      TAB      Ectopic      Multiple      Live Births               Home Medications    Prior to Admission medications   Medication Sig Start Date End Date Taking? Authorizing Provider  albuterol (PROAIR HFA) 108 (90 BASE) MCG/ACT inhaler Inhale 2 puffs into the lungs every 6 (six) hours as needed for wheezing. 03/11/13  Yes Dunn, Raymon Mutton, PA-C  chlorthalidone (HYGROTON) 25 MG tablet Take 25 mg by mouth daily.  03/04/18  Yes [provider]  diclofenac (VOLTAREN) 75 MG EC tablet Take 75 mg by mouth 2 (two) times daily. 03/13/18  Yes [provider]  esomeprazole (NEXIUM) 40 MG capsule Take 40 mg by mouth daily. 03/04/18  Yes [provider]  levocetirizine (XYZAL) 5 MG tablet Take 5 mg by mouth every evening.   Yes [provider]  LORazepam (ATIVAN) 1 MG tablet Take 1 mg by mouth at bedtime. 04/09/18  Yes [provider]  montelukast (SINGULAIR) 10 MG tablet TAKE ONE TABLET AT BEDTIME. Patient taking differently: TAKE 10 mg once daily 04/22/14  Yes Ethelda Chick, MD  ondansetron (ZOFRAN) 4 MG tablet Take 4 mg by mouth daily as needed for nausea or vomiting.  02/19/18  Yes [provider]  oxyCODONE-acetaminophen (PERCOCET/ROXICET) 5-325 MG tablet Take 1-2 tablets by mouth every 6 (six) hours as needed for moderate  pain.  06/20/17  Yes [provider]  venlafaxine XR (EFFEXOR-XR) 37.5 MG 24 hr capsule Take 112.5 mg by mouth daily. 06/17/17  Yes [provider]  ALPRAZolam (XANAX) 0.25 MG tablet TAKE 1 TABLET AT BEDTIME AS NEEDED FOR ANXIETY. Patient not taking: Reported on 04/12/2018 10/02/17   Harrington Challenger, NP  dicyclomine (BENTYL) 20 MG tablet Take 1 tablet (20 mg total) by mouth 4 (four) times daily -  before meals and at bedtime. 09/16/17 09/23/17  Shaune Pollack, MD  LOPREEZA 0.5-0.1 MG tablet TAKE 1 TABLET DAILY. Patient not taking: Reported on 02/09/2018 09/26/17   Harrington Challenger, NP  ondansetron (ZOFRAN ODT) 4 MG disintegrating tablet Take 1 tablet (4 mg total) by mouth every 8 (eight) hours as needed for nausea or vomiting. Patient not taking: Reported on 02/09/2018 09/16/17   Shaune Pollack, MD  potassium chloride (K-DUR) 10 MEQ tablet Take 2 tablets (20 mEq total) by mouth 2 (two) times daily. 07/02/17 07/04/17  Alvira Monday, MD  potassium chloride (K-DUR) 10 MEQ tablet Take 3 tablets (30 mEq total) by mouth daily for 5 days. 02/10/18 02/15/18  Purohit, Salli Quarry, MD  potassium chloride SA (K-DUR,KLOR-CON) 20 MEQ tablet Take 2 tablets (40 mEq total) by mouth daily. 09/16/17 09/19/17  Shaune Pollack, MD    Family History Family History  Problem Relation Age of Onset  . Heart disease Mother   . Hypertension Father   . Diabetes Paternal Grandfather   . Heart disease Paternal Grandfather   . Stroke Maternal Grandmother   . Stroke Maternal Grandfather   . Breast cancer Maternal Aunt        Age 62's    Social History Social History   Tobacco Use  . Smoking status: Current Every Day Smoker    Packs/day: 1.50    Years: 10.00    Pack years: 15.00    Types: Cigarettes  . Smokeless tobacco: Never Used  Substance Use Topics  . Alcohol use: No    Comment: rare  . Drug use: Yes    Types: Marijuana    Comment: yes- will refrain x24 hours     Allergies   Avelox  [moxifloxacin hcl in nacl]   Review of Systems Review of Systems  Gastrointestinal: Positive for abdominal pain, nausea and vomiting. Negative for constipation and diarrhea.  All other systems reviewed and are negative.    Physical Exam Updated Vital Signs BP (!) 158/96   Pulse 97   Temp 98.9 F (37.2 C) (Oral)   Resp 17   Ht  (1.651 m)   Wt  68 kg (150 lb)   SpO2 100%   BMI 24.96 kg/m   Physical Exam  Constitutional: She is oriented to person, place, and time. She appears well-developed and well-nourished. No distress.  HENT:  Head: Normocephalic and atraumatic.  Cardiovascular: Normal rate, regular rhythm and normal heart sounds.  No murmur heard. Pulmonary/Chest: Effort normal and breath sounds normal. No respiratory distress.  Abdominal: Soft. She exhibits no distension. There is no tenderness.  Musculoskeletal: Normal range of motion.  Neurological: She is alert and oriented to person, place, and time.  Skin: Skin is warm and dry.  Nursing note and vitals reviewed.    ED Treatments / Results  Labs (all labs ordered are listed, but only abnormal results are displayed) Labs Reviewed  COMPREHENSIVE METABOLIC PANEL - Abnormal; Notable for the following components:      Result Value   Sodium 131 (*)    Potassium 2.9 (*)    Chloride 91 (*)    CO2 21 (*)    Glucose, Bld 154 (*)    Total Protein 8.4 (*)    Anion gap 19 (*)    All other components within normal limits  CBC - Abnormal; Notable for the following components:   WBC 11.7 (*)    RBC 5.22 (*)    Hemoglobin 15.6 (*)    MCHC 36.1 (*)    Platelets 507 (*)    All other components within normal limits  LIPASE, BLOOD    EKG None  Radiology No results found.  Procedures Procedures (including critical care time)  Medications Ordered in ED Medications  potassium chloride 10 mEq in 100 mL IVPB (10 mEq Intravenous New Bag/Given 04/12/18 1515)  ondansetron (ZOFRAN-ODT) disintegrating tablet 4 mg  (4 mg Oral Given 04/12/18 1037)  sodium chloride 0.9 % bolus 1,000 mL (0 mLs Intravenous Stopped 04/12/18 1306)  ondansetron (ZOFRAN) injection 4 mg (4 mg Intravenous Given 04/12/18 1200)  HYDROmorphone (DILAUDID) injection 1 mg (1 mg Intravenous Given 04/12/18 1200)  haloperidol lactate (HALDOL) injection 5 mg (5 mg Intravenous Given 04/12/18 1344)  sodium chloride 0.9 % bolus 1,000 mL (0 mLs Intravenous Stopped 04/12/18 1452)  HYDROmorphone (DILAUDID) injection 0.5 mg (0.5 mg Intravenous Given 04/12/18 1403)     Initial Impression / Assessment and Plan / ED Course  I have reviewed the triage vital signs and the nursing notes.  Pertinent labs & imaging results that were available during my care of the patient were reviewed by me and considered in my medical decision making (see chart for details).    Alexianna Nachreiner is a 57 y.o. female who presents to ED for nausea and vomiting consistent with her previous episodes of cyclical vomiting syndrome.  On exam, patient afebrile, hemodynamically stable with no abdominal tenderness.  Labs reviewed.  She does have a potassium of 2.9.  Given both IV and p.o. supplementation in the ED.  Remainder of her lab work reassuring.  Adequately hydrated and given symptomatic medications.  Reevaluated multiple times throughout ED stay.  On final reevaluation, patient feels much improved and would like to go home.  She states that she has enough nausea medicine at home as well.  She has been tolerating p.o. without difficulty.  Will finish her fluids and IV potassium then discharge home if still no further emesis. PCP follow up encouraged. All questions answered.     Final Clinical Impressions(s) / ED Diagnoses   Final diagnoses:  Non-intractable cyclical vomiting with nausea    ED Discharge Orders  None       Mairen Wallenstein, Chase Picket, PA-C 04/12/18 1608    Charlynne Pander, MD 04/13/18 [phone]

## 2018-04-12 NOTE — ED Triage Notes (Signed)
Pt has recurrent hx cyclic vomiting syndrome. Reports abd pains with n/v started around 6am today.   Has letter from her PCP with treatment plan for patient that states: "vigorous volume resuscitation intravenously, IV Dilaudid  x 1 and IV Zofran  x 1."

## 2018-04-12 NOTE — Discharge Instructions (Addendum)
It was my pleasure taking care of you today!   Increase hydration. Take potassium as directed for the next two days.   Call your primary care doctor on Tuesday to schedule a follow up appointment.   Return to ER for new or worsening symptoms, any additional concerns.

## 2018-04-12 NOTE — ED Notes (Signed)
Patient requesting additional pain medication. PA made aware. 

## 2018-04-12 NOTE — ED Notes (Signed)
Patient on cardiac monitoring. 

## 2018-04-12 NOTE — ED Notes (Signed)
Per PA request, patient provided with water.

## 2018-04-21 DIAGNOSIS — L680 Hirsutism: Secondary | ICD-10-CM | POA: Diagnosis not present

## 2018-04-21 DIAGNOSIS — L578 Other skin changes due to chronic exposure to nonionizing radiation: Secondary | ICD-10-CM | POA: Diagnosis not present

## 2018-04-21 DIAGNOSIS — L812 Freckles: Secondary | ICD-10-CM | POA: Diagnosis not present

## 2018-04-21 DIAGNOSIS — L821 Other seborrheic keratosis: Secondary | ICD-10-CM | POA: Diagnosis not present

## 2018-05-12 DIAGNOSIS — G43909 Migraine, unspecified, not intractable, without status migrainosus: Secondary | ICD-10-CM | POA: Diagnosis not present

## 2018-05-12 DIAGNOSIS — F419 Anxiety disorder, unspecified: Secondary | ICD-10-CM | POA: Diagnosis not present

## 2018-05-12 DIAGNOSIS — G43A0 Cyclical vomiting, not intractable: Secondary | ICD-10-CM | POA: Diagnosis not present

## 2018-05-12 DIAGNOSIS — M545 Low back pain: Secondary | ICD-10-CM | POA: Diagnosis not present

## 2018-06-03 DIAGNOSIS — H40023 Open angle with borderline findings, high risk, bilateral: Secondary | ICD-10-CM | POA: Diagnosis not present

## 2018-06-03 DIAGNOSIS — H5213 Myopia, bilateral: Secondary | ICD-10-CM | POA: Diagnosis not present

## 2018-06-03 DIAGNOSIS — H2513 Age-related nuclear cataract, bilateral: Secondary | ICD-10-CM | POA: Diagnosis not present

## 2018-06-18 DIAGNOSIS — H6091 Unspecified otitis externa, right ear: Secondary | ICD-10-CM | POA: Diagnosis not present

## 2018-06-18 DIAGNOSIS — M545 Low back pain: Secondary | ICD-10-CM | POA: Diagnosis not present

## 2018-06-18 DIAGNOSIS — Z0000 Encounter for general adult medical examination without abnormal findings: Secondary | ICD-10-CM | POA: Diagnosis not present

## 2018-06-19 DIAGNOSIS — I10 Essential (primary) hypertension: Secondary | ICD-10-CM | POA: Diagnosis not present

## 2018-06-19 DIAGNOSIS — E559 Vitamin D deficiency, unspecified: Secondary | ICD-10-CM | POA: Diagnosis not present

## 2018-06-19 DIAGNOSIS — G43019 Migraine without aura, intractable, without status migrainosus: Secondary | ICD-10-CM | POA: Diagnosis not present

## 2018-06-19 DIAGNOSIS — G43719 Chronic migraine without aura, intractable, without status migrainosus: Secondary | ICD-10-CM | POA: Diagnosis not present

## 2018-06-19 DIAGNOSIS — Z79899 Other long term (current) drug therapy: Secondary | ICD-10-CM | POA: Diagnosis not present

## 2018-06-19 DIAGNOSIS — E7800 Pure hypercholesterolemia, unspecified: Secondary | ICD-10-CM | POA: Diagnosis not present

## 2018-06-25 DIAGNOSIS — H16203 Unspecified keratoconjunctivitis, bilateral: Secondary | ICD-10-CM | POA: Diagnosis not present

## 2018-07-23 DIAGNOSIS — J45901 Unspecified asthma with (acute) exacerbation: Secondary | ICD-10-CM | POA: Diagnosis not present

## 2018-07-23 DIAGNOSIS — J320 Chronic maxillary sinusitis: Secondary | ICD-10-CM | POA: Diagnosis not present

## 2018-07-28 DIAGNOSIS — M47816 Spondylosis without myelopathy or radiculopathy, lumbar region: Secondary | ICD-10-CM | POA: Diagnosis not present

## 2018-07-28 DIAGNOSIS — Z6825 Body mass index (BMI) 25.0-25.9, adult: Secondary | ICD-10-CM | POA: Diagnosis not present

## 2018-07-28 DIAGNOSIS — M4712 Other spondylosis with myelopathy, cervical region: Secondary | ICD-10-CM | POA: Diagnosis not present

## 2018-08-18 DIAGNOSIS — E7800 Pure hypercholesterolemia, unspecified: Secondary | ICD-10-CM | POA: Diagnosis not present

## 2018-10-23 DIAGNOSIS — H1045 Other chronic allergic conjunctivitis: Secondary | ICD-10-CM | POA: Diagnosis not present

## 2018-10-23 DIAGNOSIS — J300 Vasomotor rhinitis: Secondary | ICD-10-CM | POA: Diagnosis not present

## 2018-10-23 DIAGNOSIS — J4520 Mild intermittent asthma, uncomplicated: Secondary | ICD-10-CM | POA: Diagnosis not present

## 2018-10-23 DIAGNOSIS — J019 Acute sinusitis, unspecified: Secondary | ICD-10-CM | POA: Diagnosis not present

## 2018-11-11 ENCOUNTER — Other Ambulatory Visit: Payer: Self-pay

## 2018-11-11 ENCOUNTER — Encounter (HOSPITAL_COMMUNITY): Payer: Self-pay

## 2018-11-11 ENCOUNTER — Emergency Department (HOSPITAL_COMMUNITY)
Admission: EM | Admit: 2018-11-11 | Discharge: 2018-11-12 | Disposition: A | Discharge status: Home / Self Care | Payer: BLUE CROSS/BLUE SHIELD | Attending: Emergency Medicine | Admitting: Emergency Medicine

## 2018-11-11 DIAGNOSIS — G43A0 Cyclical vomiting, not intractable: Secondary | ICD-10-CM | POA: Diagnosis not present

## 2018-11-11 DIAGNOSIS — R109 Unspecified abdominal pain: Secondary | ICD-10-CM | POA: Diagnosis not present

## 2018-11-11 DIAGNOSIS — E876 Hypokalemia: Secondary | ICD-10-CM

## 2018-11-11 DIAGNOSIS — E871 Hypo-osmolality and hyponatremia: Secondary | ICD-10-CM

## 2018-11-11 DIAGNOSIS — F17210 Nicotine dependence, cigarettes, uncomplicated: Secondary | ICD-10-CM | POA: Insufficient documentation

## 2018-11-11 DIAGNOSIS — I10 Essential (primary) hypertension: Secondary | ICD-10-CM | POA: Diagnosis not present

## 2018-11-11 DIAGNOSIS — Z79899 Other long term (current) drug therapy: Secondary | ICD-10-CM | POA: Diagnosis not present

## 2018-11-11 DIAGNOSIS — R1084 Generalized abdominal pain: Secondary | ICD-10-CM | POA: Diagnosis not present

## 2018-11-11 DIAGNOSIS — R110 Nausea: Secondary | ICD-10-CM | POA: Diagnosis not present

## 2018-11-11 DIAGNOSIS — R1115 Cyclical vomiting syndrome unrelated to migraine: Secondary | ICD-10-CM

## 2018-11-11 DIAGNOSIS — R52 Pain, unspecified: Secondary | ICD-10-CM | POA: Diagnosis not present

## 2018-11-11 DIAGNOSIS — R17 Unspecified jaundice: Secondary | ICD-10-CM

## 2018-11-11 MED ORDER — ONDANSETRON HCL 4 MG/2ML IJ SOLN
4.0000 mg | Freq: Once | INTRAMUSCULAR | Status: AC
  Administered 2018-11-11: 4 mg via INTRAVENOUS
  Filled 2018-11-11: qty 2

## 2018-11-11 MED ORDER — SODIUM CHLORIDE 0.9 % IV BOLUS
1000.0000 mL | Freq: Once | INTRAVENOUS | Status: AC
  Administered 2018-11-12: 1000 mL via INTRAVENOUS

## 2018-11-11 MED ORDER — HYDROMORPHONE HCL 1 MG/ML IJ SOLN
1.0000 mg | Freq: Once | INTRAMUSCULAR | Status: AC
  Administered 2018-11-11: 1 mg via INTRAVENOUS
  Filled 2018-11-11: qty 1

## 2018-11-11 NOTE — ED Provider Notes (Addendum)
[facility]-EMERGENCY DEPT Provider Note   CSN: [id number] Arrival date & time: 11/11/18  2302     History   Chief Complaint Chief Complaint  Patient presents with  . Abdominal Pain  . Emesis    HPI Katie Chang is a 57 y.o. female.  The history is provided by the patient.  She has history of cyclic vomiting syndrome, as well as hypertension, GERD, asthma and comes in with abdominal pain with vomiting typical of her cyclic vomiting syndrome.  Symptoms started about 3:30 PM.  She is vomited numerous times and is complaining of severe upper abdominal pain which she rates a 10/10.  Nothing makes it better, nothing makes it worse.  She has not had fever but has had chills.  She did take ondansetron at home with no relief.  She was brought in by ambulance who gave her additional ondansetron without relief.  Past Medical History:  Diagnosis Date  . Allergy   . Arthritis   . Asthma   . Cervical dysplasia   . Cyclic vomiting syndrome   . DDD (degenerative disc disease), cervical    Cervical and Lumbar  . Depression   . Eczema   . GERD (gastroesophageal reflux disease)   . Hypertension   . Menopausal symptoms   . Migraines   . PONV (postoperative nausea and vomiting)    sometimes nausea  . Recurrent sinus infections     Patient Active Problem List   Diagnosis Date Noted  . Hypokalemia 02/09/2018  . Possible Cyclic vomiting syndrome 02/09/2018  . Dehydration 02/09/2018  . Depression   . Asthma   . Eczema   . Cervical dysplasia   . Menopausal symptoms   . Migraines     Past Surgical History:  Procedure Laterality Date  . CERVICAL BIOPSY     Dr. Eda PaschalGottsegen  . COLONOSCOPY W/ POLYPECTOMY    . COLPOSCOPY    . ESOPHAGOGASTRODUODENOSCOPY (EGD) WITH PROPOFOL N/A 05/13/2016   Procedure: ESOPHAGOGASTRODUODENOSCOPY (EGD) WITH PROPOFOL;  Surgeon: Charolett BumpersMartin K Johnson, MD;  Location: [facility];  Service: Endoscopy;  Laterality: N/A;  . KNEE SURGERY    .  NASAL SINUS SURGERY  1992  . NECK SURGERY    . TONSILLECTOMY  1990     OB History    Gravida  1   Para      Term      Preterm      AB  1   Living  0     SAB      TAB      Ectopic      Multiple      Live Births               Home Medications    Prior to Admission medications   Medication Sig Start Date End Date Taking? Authorizing Provider  albuterol (PROAIR HFA) 108 (90 BASE) MCG/ACT inhaler Inhale 2 puffs into the lungs every 6 (six) hours as needed for wheezing. 03/11/13   Shea Evansunn, Raymon Muttonyan M, PA-C  ALPRAZolam (XANAX) 0.25 MG tablet TAKE 1 TABLET AT BEDTIME AS NEEDED FOR ANXIETY. Patient not taking: Reported on 04/12/2018 10/02/17   Harrington ChallengerYoung, Nancy J, NP  chlorthalidone (HYGROTON) 25 MG tablet Take 25 mg by mouth daily. 03/04/18   [provider]  diclofenac (VOLTAREN) 75 MG EC tablet Take 75 mg by mouth 2 (two) times daily. 03/13/18   [provider]  dicyclomine (BENTYL) 20 MG tablet Take 1 tablet (20 mg total) by  mouth 4 (four) times daily -  before meals and at bedtime. 09/16/17 09/23/17  Shaune Pollack, MD  esomeprazole (NEXIUM) 40 MG capsule Take 40 mg by mouth daily. 03/04/18   [provider]  levocetirizine (XYZAL) 5 MG tablet Take 5 mg by mouth every evening.    [provider]  LOPREEZA 0.5-0.1 MG tablet TAKE 1 TABLET DAILY. Patient not taking: Reported on 02/09/2018 09/26/17   Harrington Challenger, NP  LORazepam (ATIVAN) 1 MG tablet Take 1 mg by mouth at bedtime. 04/09/18   [provider]  montelukast (SINGULAIR) 10 MG tablet TAKE ONE TABLET AT BEDTIME. Patient taking differently: TAKE 10 mg once daily 04/22/14   Ethelda Chick, MD  ondansetron (ZOFRAN ODT) 4 MG disintegrating tablet Take 1 tablet (4 mg total) by mouth every 8 (eight) hours as needed for nausea or vomiting. Patient not taking: Reported on 02/09/2018 09/16/17   Shaune Pollack, MD  ondansetron (ZOFRAN) 4 MG tablet Take 4 mg by mouth daily as needed for nausea or  vomiting.  02/19/18   [provider]  oxyCODONE-acetaminophen (PERCOCET/ROXICET) 5-325 MG tablet Take 1-2 tablets by mouth every 6 (six) hours as needed for moderate pain.  06/20/17   [provider]  potassium chloride (K-DUR) 10 MEQ tablet Take 2 tablets (20 mEq total) by mouth 2 (two) times daily for 2 days. 04/12/18 04/14/18  Ward, Chase Picket, PA-C  potassium chloride SA (K-DUR,KLOR-CON) 20 MEQ tablet Take 2 tablets (40 mEq total) by mouth daily. 09/16/17 09/19/17  Shaune Pollack, MD  venlafaxine XR (EFFEXOR-XR) 37.5 MG 24 hr capsule Take 112.5 mg by mouth daily. 06/17/17   [provider]    Family History Family History  Problem Relation Age of Onset  . Heart disease Mother   . Hypertension Father   . Diabetes Paternal Grandfather   . Heart disease Paternal Grandfather   . Stroke Maternal Grandmother   . Stroke Maternal Grandfather   . Breast cancer Maternal Aunt        Age 45's    Social History Social History   Tobacco Use  . Smoking status: Current Every Day Smoker    Packs/day: 1.50    Years: 10.00    Pack years: 15.00    Types: Cigarettes  . Smokeless tobacco: Never Used  Substance Use Topics  . Alcohol use: No    Comment: rare  . Drug use: Yes    Types: Marijuana    Comment: yes- will refrain x24 hours     Allergies   Avelox [moxifloxacin hcl in nacl]   Review of Systems Review of Systems  All other systems reviewed and are negative.    Physical Exam Updated Vital Signs BP (!) 148/106 (BP Location: Left Arm)   Pulse 94   Temp 98.2 F (36.8 C) (Oral)   Resp 16   Ht 5\' 5"  (1.651 m)   Wt 70.3 kg   SpO2 100%   BMI 25.79 kg/m   Physical Exam Vitals signs and nursing note reviewed.    57 year old female, very uncomfortable and in obvious pain, but in no acute distress. Vital signs are significant for elevated blood pressure. Oxygen saturation is 100%, which is normal. Head is normocephalic and atraumatic. PERRLA, EOMI.  Oropharynx is clear. Neck is nontender and supple without adenopathy or JVD. Back is nontender and there is no CVA tenderness. Lungs are clear without rales, wheezes, or rhonchi. Chest is nontender. Heart has regular rate and rhythm without murmur.  Abdomen is soft, flat, with moderate tenderness diffusely.  There is no rebound or guarding.  There are no t masses or hepatosplenomegaly and peristalsis is hypoactive. Extremities have no cyanosis or edema, full range of motion is present. Skin is warm and dry without rash. Neurologic: Mental status is normal, cranial nerves are intact, there are no motor or sensory deficits.  ED Treatments / Results  Labs (all labs ordered are listed, but only abnormal results are displayed) Labs Reviewed  COMPREHENSIVE METABOLIC PANEL - Abnormal; Notable for the following components:      Result Value   Sodium 128 (*)    Potassium 3.0 (*)    Chloride 90 (*)    CO2 21 (*)    Glucose, Bld 165 (*)    AST 44 (*)    Total Bilirubin 2.0 (*)    Anion gap 17 (*)    All other components within normal limits  CBC WITH DIFFERENTIAL/PLATELET - Abnormal; Notable for the following components:   WBC 13.1 (*)    Neutro Abs 12.0 (*)    All other components within normal limits  LIPASE, BLOOD  MAGNESIUM    EKG EKG Interpretation  Date/Time:  Thursday November 12 2018 00:25:18 EST Ventricular Rate:  95 PR Interval:    QRS Duration: 98 QT Interval:  302 QTC Calculation: 380 R Axis:   81 Text Interpretation:  Atrial fibrillation Probable LVH with secondary repol abnrm When compared with ECG of 02/09/2018, No significant change was found Normal QT interval Confirmed by Dione BoozeGlick, Sitlaly Gudiel ([id number]) on 11/12/2018 12:42:15 AM  Procedures Procedures  Medications Ordered in ED Medications  potassium chloride 10 mEq in 100 mL IVPB (10 mEq Intravenous New Bag/Given 11/12/18 0334)  sodium chloride 0.9 % bolus 1,000 mL (0 mLs Intravenous Stopped 11/12/18 0049)  ondansetron  (ZOFRAN) injection 4 mg (4 mg Intravenous Given 11/11/18 2356)  HYDROmorphone (DILAUDID) injection 1 mg (1 mg Intravenous Given 11/11/18 2356)  HYDROmorphone (DILAUDID) injection 1 mg (1 mg Intravenous Given 11/12/18 0049)  metoCLOPramide (REGLAN) injection 10 mg (10 mg Intravenous Given 11/12/18 0049)  diphenhydrAMINE (BENADRYL) injection 25 mg (25 mg Intravenous Given 11/12/18 0049)     Initial Impression / Assessment and Plan / ED Course  I have reviewed the triage vital signs and the nursing notes.  Pertinent labs & imaging results that were available during my care of the patient were reviewed by me and considered in my medical decision making (see chart for details).  Abdominal pain with vomiting typical of exacerbation of cyclic vomiting syndrome.  Screening labs are obtained, and patient states that she does frequently get severe hypokalemia with these episodes.  She will be given IV fluids, hydromorphone, ondansetron.  Old records are reviewed confirming prior ED visits and hospitalizations for cyclic vomiting syndrome.  She continues to have significant nausea following ondansetron.  ECG was obtained to evaluate QT interval, which was found to be normal.  She was given metoclopramide with excellent control of nausea.  Pain was controlled with hydromorphone.  Labs show moderate hypokalemia and she is given intravenous potassium.  Magnesium was checked and was found to be normal.  Mild hyponatremia was not felt to be clinically significant.  Elevated bilirubin noted of uncertain cause, but had not been present previously.  This will need to be followed as an outpatient.  She is discharged with prescription for metoclopramide.  She has oxycodone-acetaminophen at home which she can use as needed for recurrence of pain.  Return precautions discussed.  6:04 AM As patient was being wheeled out of the department, she had sudden onset of recurrence of abdominal pain and vomiting.  IV is reinserted  and she is given additional IV fluids, ondansetron, metoclopramide.  At this point, I recommended admission, but she stated she wanted to see if additional fluids and pain and nausea medicine would settle her symptoms down to where she would be able to go home.  8:52 AM She is doing somewhat better, but is actually having moderate pain.  She also states that she feels that she is having some reflux issues and is requesting Protonix.  She is given a dose of intravenous pantoprazole.  I have discussed with her that I feel that she has failed ED management and would do best with hospital admission, she still wishes to try to avoid that.  Case is signed out to Dr. Ethelda Chick who will evaluate her response to treatment and decide whether admission is needed.  Final Clinical Impressions(s) / ED Diagnoses   Final diagnoses:  Cyclical vomiting, not intractable  Hypokalemia due to excessive gastrointestinal loss of potassium  Hyponatremia  Total bilirubin, elevated    ED Discharge Orders         Ordered    potassium chloride SA (K-DUR,KLOR-CON) 20 MEQ tablet  2 times daily     11/12/18 0350    metoCLOPramide (REGLAN) 10 MG tablet  Every 6 hours PRN     11/12/18 0351           Dione Booze, MD 11/12/18 0354    Dione Booze, MD 11/12/18 [phone]

## 2018-11-11 NOTE — ED Notes (Signed)
Placed patient in side lying position. Patient is dry heaving. Had one episode of vomiting with small amount of emesis that is clear.

## 2018-11-11 NOTE — ED Triage Notes (Signed)
Per EMS, patient coming from home complaining of abdominal pain and N/V. Her last meal was breakfast. She started vomiting around 3 PM and has not stopped.   She took 8 mg of Zofran at home and has had 4 mg of Zofran and 250 of NS with EMS.

## 2018-11-11 NOTE — ED Notes (Signed)
Bed: [id number] Expected date:  Expected time:  Means of arrival:  Comments: 57 yo F/N/V

## 2018-11-12 LAB — COMPREHENSIVE METABOLIC PANEL
ALT: 25 U/L (ref 0–44)
ANION GAP: 17 — AB (ref 5–15)
AST: 44 U/L — ABNORMAL HIGH (ref 15–41)
Albumin: 4.4 g/dL (ref 3.5–5.0)
Alkaline Phosphatase: 61 U/L (ref 38–126)
BUN: 14 mg/dL (ref 6–20)
CALCIUM: 9.2 mg/dL (ref 8.9–10.3)
CHLORIDE: 90 mmol/L — AB (ref 98–111)
CO2: 21 mmol/L — AB (ref 22–32)
CREATININE: 0.67 mg/dL (ref 0.44–1.00)
Glucose, Bld: 165 mg/dL — ABNORMAL HIGH (ref 70–99)
Potassium: 3 mmol/L — ABNORMAL LOW (ref 3.5–5.1)
SODIUM: 128 mmol/L — AB (ref 135–145)
Total Bilirubin: 2 mg/dL — ABNORMAL HIGH (ref 0.3–1.2)
Total Protein: 7.5 g/dL (ref 6.5–8.1)

## 2018-11-12 LAB — MAGNESIUM: Magnesium: 2 mg/dL (ref 1.7–2.4)

## 2018-11-12 LAB — CBC WITH DIFFERENTIAL/PLATELET
Abs Immature Granulocytes: 0.07 10*3/uL (ref 0.00–0.07)
BASOS ABS: 0 10*3/uL (ref 0.0–0.1)
BASOS PCT: 0 %
EOS PCT: 0 %
Eosinophils Absolute: 0 10*3/uL (ref 0.0–0.5)
HCT: 40.4 % (ref 36.0–46.0)
HEMOGLOBIN: 14.3 g/dL (ref 12.0–15.0)
Immature Granulocytes: 1 %
LYMPHS PCT: 5 %
Lymphs Abs: 0.7 10*3/uL (ref 0.7–4.0)
MCH: 29.7 pg (ref 26.0–34.0)
MCHC: 35.4 g/dL (ref 30.0–36.0)
MCV: 83.8 fL (ref 80.0–100.0)
MONO ABS: 0.3 10*3/uL (ref 0.1–1.0)
Monocytes Relative: 2 %
NEUTROS ABS: 12 10*3/uL — AB (ref 1.7–7.7)
NRBC: 0 % (ref 0.0–0.2)
Neutrophils Relative %: 92 %
PLATELETS: 370 10*3/uL (ref 150–400)
RBC: 4.82 MIL/uL (ref 3.87–5.11)
RDW: 13.2 % (ref 11.5–15.5)
WBC: 13.1 10*3/uL — AB (ref 4.0–10.5)

## 2018-11-12 LAB — LIPASE, BLOOD: Lipase: 26 U/L (ref 11–51)

## 2018-11-12 MED ORDER — HALOPERIDOL LACTATE 5 MG/ML IJ SOLN: 2.0000 mg | Freq: Once | INTRAMUSCULAR | Status: DC

## 2018-11-12 MED ORDER — PANTOPRAZOLE SODIUM 40 MG IV SOLR
40.0000 mg | Freq: Once | INTRAVENOUS | Status: AC
  Administered 2018-11-12: 40 mg via INTRAVENOUS
  Filled 2018-11-12: qty 40

## 2018-11-12 MED ORDER — SODIUM CHLORIDE 0.9 % IV BOLUS
1000.0000 mL | Freq: Once | INTRAVENOUS | Status: AC
  Administered 2018-11-12: 1000 mL via INTRAVENOUS

## 2018-11-12 MED ORDER — POTASSIUM CHLORIDE CRYS ER 20 MEQ PO TBCR: 20.0000 meq | EXTENDED_RELEASE_TABLET | Freq: Two times a day (BID) | ORAL | 0 refills | Status: DC

## 2018-11-12 MED ORDER — HYDROCODONE-ACETAMINOPHEN 5-325 MG PO TABS
1.0000 | ORAL_TABLET | Freq: Once | ORAL | Status: AC
  Administered 2018-11-12: 1 via ORAL
  Filled 2018-11-12: qty 1

## 2018-11-12 MED ORDER — POTASSIUM CHLORIDE 10 MEQ/100ML IV SOLN
10.0000 meq | INTRAVENOUS | Status: AC
  Administered 2018-11-12 (×2): 10 meq via INTRAVENOUS
  Filled 2018-11-12 (×2): qty 100

## 2018-11-12 MED ORDER — DIPHENHYDRAMINE HCL 50 MG/ML IJ SOLN
25.0000 mg | Freq: Once | INTRAMUSCULAR | Status: AC
  Administered 2018-11-12: 25 mg via INTRAVENOUS
  Filled 2018-11-12: qty 1

## 2018-11-12 MED ORDER — HYDROMORPHONE HCL 1 MG/ML IJ SOLN
1.0000 mg | Freq: Once | INTRAMUSCULAR | Status: AC
  Administered 2018-11-12: 1 mg via INTRAVENOUS
  Filled 2018-11-12: qty 1

## 2018-11-12 MED ORDER — METOCLOPRAMIDE HCL 5 MG/ML IJ SOLN
10.0000 mg | Freq: Once | INTRAMUSCULAR | Status: AC
  Administered 2018-11-12: 10 mg via INTRAVENOUS
  Filled 2018-11-12: qty 2

## 2018-11-12 MED ORDER — METOCLOPRAMIDE HCL 10 MG PO TABS: 10.0000 mg | ORAL_TABLET | Freq: Four times a day (QID) | ORAL | 0 refills | Status: DC | PRN

## 2018-11-12 NOTE — ED Notes (Signed)
EKG given to EDP,Glick,MD., for review. 

## 2018-11-12 NOTE — ED Notes (Signed)
Patient is sweating profusely, but states that this is normal for her when she is dry heaving/vomiting.

## 2018-11-12 NOTE — ED Provider Notes (Addendum)
9:3 AM seen by me patient reports "my stomach still does not feel right" she feels nauseated.  Symptoms identical to symptoms from cyclic vomiting syndrome she is had in the past.  Additional iv hydromorphineordered ordered patient's request.  She is declining antiemetics   Doug SouJacubowitz, Oumar Marcott, MD 11/12/18 1001 I have reviewed patient's EKG.  EKG not consistent with atrial fibrillation.  Distinct P waves present.  EKG identical to prior tracings, normal sinus rhythm   Terrion Gencarelli, MD 11/12/18 1002 11:15 AM patient is alert ambulatory pain is well controlled she is able to drink without vomiting or nausea and feels ready to go home.   Doug SouJacubowitz, Genee Rann, MD 11/12/18 1119

## 2018-11-12 NOTE — Discharge Instructions (Addendum)
Take your oxycodone-acetaminophen (Percocet) as needed.  Return if symptoms are not being adequately controlled at home.

## 2018-11-12 NOTE — ED Notes (Signed)
Patient is dry heaving and complaining of nausea/pain. EDP made aware.

## 2018-11-12 NOTE — ED Notes (Signed)
Was assisting patient to car for discharge when she started to vomit and complain of abdominal pain again. Patient moved back to room and provided emesis bag.

## 2018-12-02 DIAGNOSIS — M5020 Other cervical disc displacement, unspecified cervical region: Secondary | ICD-10-CM | POA: Diagnosis not present

## 2018-12-02 DIAGNOSIS — M4712 Other spondylosis with myelopathy, cervical region: Secondary | ICD-10-CM | POA: Diagnosis not present

## 2018-12-17 DIAGNOSIS — G43019 Migraine without aura, intractable, without status migrainosus: Secondary | ICD-10-CM | POA: Diagnosis not present

## 2018-12-17 DIAGNOSIS — G43719 Chronic migraine without aura, intractable, without status migrainosus: Secondary | ICD-10-CM | POA: Diagnosis not present

## 2019-01-26 ENCOUNTER — Other Ambulatory Visit: Payer: Self-pay | Admitting: Sports Medicine

## 2019-01-26 DIAGNOSIS — M545 Low back pain, unspecified: Secondary | ICD-10-CM

## 2019-01-27 DIAGNOSIS — M545 Low back pain: Secondary | ICD-10-CM | POA: Diagnosis not present

## 2019-01-27 DIAGNOSIS — M25552 Pain in left hip: Secondary | ICD-10-CM | POA: Diagnosis not present

## 2019-01-30 ENCOUNTER — Ambulatory Visit
Admission: RE | Admit: 2019-01-30 | Discharge: 2019-01-30 | Disposition: A | Discharge status: Home / Self Care | Payer: BLUE CROSS/BLUE SHIELD | Source: Ambulatory Visit | Attending: Sports Medicine | Admitting: Sports Medicine

## 2019-01-30 ENCOUNTER — Other Ambulatory Visit: Payer: Self-pay

## 2019-01-30 DIAGNOSIS — M545 Low back pain, unspecified: Secondary | ICD-10-CM

## 2019-01-30 DIAGNOSIS — M48061 Spinal stenosis, lumbar region without neurogenic claudication: Secondary | ICD-10-CM | POA: Diagnosis not present

## 2019-02-03 DIAGNOSIS — M545 Low back pain: Secondary | ICD-10-CM | POA: Diagnosis not present

## 2019-02-16 DIAGNOSIS — M545 Low back pain: Secondary | ICD-10-CM | POA: Diagnosis not present

## 2019-03-01 DIAGNOSIS — I10 Essential (primary) hypertension: Secondary | ICD-10-CM | POA: Diagnosis not present

## 2019-03-01 DIAGNOSIS — F419 Anxiety disorder, unspecified: Secondary | ICD-10-CM | POA: Diagnosis not present

## 2019-03-01 DIAGNOSIS — E7800 Pure hypercholesterolemia, unspecified: Secondary | ICD-10-CM | POA: Diagnosis not present

## 2019-03-01 DIAGNOSIS — G43A0 Cyclical vomiting, not intractable: Secondary | ICD-10-CM | POA: Diagnosis not present

## 2019-03-17 DIAGNOSIS — H1045 Other chronic allergic conjunctivitis: Secondary | ICD-10-CM | POA: Diagnosis not present

## 2019-03-17 DIAGNOSIS — J4520 Mild intermittent asthma, uncomplicated: Secondary | ICD-10-CM | POA: Diagnosis not present

## 2019-03-17 DIAGNOSIS — J019 Acute sinusitis, unspecified: Secondary | ICD-10-CM | POA: Diagnosis not present

## 2019-03-17 DIAGNOSIS — J300 Vasomotor rhinitis: Secondary | ICD-10-CM | POA: Diagnosis not present

## 2019-03-23 DIAGNOSIS — M545 Low back pain: Secondary | ICD-10-CM | POA: Diagnosis not present

## 2019-03-23 DIAGNOSIS — M4316 Spondylolisthesis, lumbar region: Secondary | ICD-10-CM | POA: Diagnosis not present

## 2019-04-14 DIAGNOSIS — H1045 Other chronic allergic conjunctivitis: Secondary | ICD-10-CM | POA: Diagnosis not present

## 2019-04-14 DIAGNOSIS — J019 Acute sinusitis, unspecified: Secondary | ICD-10-CM | POA: Diagnosis not present

## 2019-04-14 DIAGNOSIS — J300 Vasomotor rhinitis: Secondary | ICD-10-CM | POA: Diagnosis not present

## 2019-04-14 DIAGNOSIS — J4520 Mild intermittent asthma, uncomplicated: Secondary | ICD-10-CM | POA: Diagnosis not present

## 2019-05-12 DIAGNOSIS — M4316 Spondylolisthesis, lumbar region: Secondary | ICD-10-CM | POA: Diagnosis not present

## 2019-05-12 DIAGNOSIS — M47817 Spondylosis without myelopathy or radiculopathy, lumbosacral region: Secondary | ICD-10-CM | POA: Diagnosis not present

## 2019-05-18 DIAGNOSIS — M47817 Spondylosis without myelopathy or radiculopathy, lumbosacral region: Secondary | ICD-10-CM | POA: Diagnosis not present

## 2019-06-01 DIAGNOSIS — M47817 Spondylosis without myelopathy or radiculopathy, lumbosacral region: Secondary | ICD-10-CM | POA: Diagnosis not present

## 2019-06-16 DIAGNOSIS — Z6826 Body mass index (BMI) 26.0-26.9, adult: Secondary | ICD-10-CM | POA: Diagnosis not present

## 2019-06-16 DIAGNOSIS — M48061 Spinal stenosis, lumbar region without neurogenic claudication: Secondary | ICD-10-CM | POA: Diagnosis not present

## 2019-06-16 DIAGNOSIS — I10 Essential (primary) hypertension: Secondary | ICD-10-CM | POA: Diagnosis not present

## 2019-06-21 DIAGNOSIS — M2560 Stiffness of unspecified joint, not elsewhere classified: Secondary | ICD-10-CM | POA: Diagnosis not present

## 2019-06-21 DIAGNOSIS — M629 Disorder of muscle, unspecified: Secondary | ICD-10-CM | POA: Diagnosis not present

## 2019-06-21 DIAGNOSIS — M545 Low back pain: Secondary | ICD-10-CM | POA: Diagnosis not present

## 2019-06-22 DIAGNOSIS — M629 Disorder of muscle, unspecified: Secondary | ICD-10-CM | POA: Diagnosis not present

## 2019-06-22 DIAGNOSIS — M545 Low back pain: Secondary | ICD-10-CM | POA: Diagnosis not present

## 2019-06-22 DIAGNOSIS — M2560 Stiffness of unspecified joint, not elsewhere classified: Secondary | ICD-10-CM | POA: Diagnosis not present

## 2019-06-24 DIAGNOSIS — Z0000 Encounter for general adult medical examination without abnormal findings: Secondary | ICD-10-CM | POA: Diagnosis not present

## 2019-06-24 DIAGNOSIS — E7800 Pure hypercholesterolemia, unspecified: Secondary | ICD-10-CM | POA: Diagnosis not present

## 2019-06-24 DIAGNOSIS — G43A0 Cyclical vomiting, not intractable: Secondary | ICD-10-CM | POA: Diagnosis not present

## 2019-06-24 DIAGNOSIS — E559 Vitamin D deficiency, unspecified: Secondary | ICD-10-CM | POA: Diagnosis not present

## 2019-06-24 DIAGNOSIS — M549 Dorsalgia, unspecified: Secondary | ICD-10-CM | POA: Diagnosis not present

## 2019-06-24 DIAGNOSIS — I10 Essential (primary) hypertension: Secondary | ICD-10-CM | POA: Diagnosis not present

## 2019-06-24 DIAGNOSIS — Z720 Tobacco use: Secondary | ICD-10-CM | POA: Diagnosis not present

## 2019-06-30 DIAGNOSIS — M545 Low back pain: Secondary | ICD-10-CM | POA: Diagnosis not present

## 2019-06-30 DIAGNOSIS — M629 Disorder of muscle, unspecified: Secondary | ICD-10-CM | POA: Diagnosis not present

## 2019-06-30 DIAGNOSIS — M2560 Stiffness of unspecified joint, not elsewhere classified: Secondary | ICD-10-CM | POA: Diagnosis not present

## 2019-07-13 DIAGNOSIS — G43019 Migraine without aura, intractable, without status migrainosus: Secondary | ICD-10-CM | POA: Diagnosis not present

## 2019-07-13 DIAGNOSIS — G43719 Chronic migraine without aura, intractable, without status migrainosus: Secondary | ICD-10-CM | POA: Diagnosis not present

## 2019-07-15 DIAGNOSIS — M629 Disorder of muscle, unspecified: Secondary | ICD-10-CM | POA: Diagnosis not present

## 2019-07-15 DIAGNOSIS — M545 Low back pain: Secondary | ICD-10-CM | POA: Diagnosis not present

## 2019-07-15 DIAGNOSIS — M2560 Stiffness of unspecified joint, not elsewhere classified: Secondary | ICD-10-CM | POA: Diagnosis not present

## 2019-07-19 DIAGNOSIS — M48061 Spinal stenosis, lumbar region without neurogenic claudication: Secondary | ICD-10-CM | POA: Diagnosis not present

## 2019-07-19 DIAGNOSIS — F1120 Opioid dependence, uncomplicated: Secondary | ICD-10-CM | POA: Insufficient documentation

## 2019-07-19 DIAGNOSIS — M4316 Spondylolisthesis, lumbar region: Secondary | ICD-10-CM | POA: Diagnosis not present

## 2019-07-20 DIAGNOSIS — M545 Low back pain: Secondary | ICD-10-CM | POA: Diagnosis not present

## 2019-07-20 DIAGNOSIS — E7800 Pure hypercholesterolemia, unspecified: Secondary | ICD-10-CM | POA: Diagnosis not present

## 2019-07-20 DIAGNOSIS — M2560 Stiffness of unspecified joint, not elsewhere classified: Secondary | ICD-10-CM | POA: Diagnosis not present

## 2019-07-20 DIAGNOSIS — R899 Unspecified abnormal finding in specimens from other organs, systems and tissues: Secondary | ICD-10-CM | POA: Diagnosis not present

## 2019-07-20 DIAGNOSIS — M629 Disorder of muscle, unspecified: Secondary | ICD-10-CM | POA: Diagnosis not present

## 2019-07-23 DIAGNOSIS — M2560 Stiffness of unspecified joint, not elsewhere classified: Secondary | ICD-10-CM | POA: Diagnosis not present

## 2019-07-23 DIAGNOSIS — M545 Low back pain: Secondary | ICD-10-CM | POA: Diagnosis not present

## 2019-07-23 DIAGNOSIS — M629 Disorder of muscle, unspecified: Secondary | ICD-10-CM | POA: Diagnosis not present

## 2019-07-28 DIAGNOSIS — D1801 Hemangioma of skin and subcutaneous tissue: Secondary | ICD-10-CM | POA: Diagnosis not present

## 2019-07-28 DIAGNOSIS — L821 Other seborrheic keratosis: Secondary | ICD-10-CM | POA: Diagnosis not present

## 2019-07-28 DIAGNOSIS — L812 Freckles: Secondary | ICD-10-CM | POA: Diagnosis not present

## 2019-07-29 DIAGNOSIS — M545 Low back pain: Secondary | ICD-10-CM | POA: Diagnosis not present

## 2019-07-29 DIAGNOSIS — M629 Disorder of muscle, unspecified: Secondary | ICD-10-CM | POA: Diagnosis not present

## 2019-07-29 DIAGNOSIS — M2560 Stiffness of unspecified joint, not elsewhere classified: Secondary | ICD-10-CM | POA: Diagnosis not present

## 2019-07-30 DIAGNOSIS — R55 Syncope and collapse: Secondary | ICD-10-CM | POA: Diagnosis not present

## 2019-07-30 DIAGNOSIS — Z23 Encounter for immunization: Secondary | ICD-10-CM | POA: Diagnosis not present

## 2019-07-30 DIAGNOSIS — E871 Hypo-osmolality and hyponatremia: Secondary | ICD-10-CM | POA: Diagnosis not present

## 2019-07-30 DIAGNOSIS — Z720 Tobacco use: Secondary | ICD-10-CM | POA: Diagnosis not present

## 2019-07-30 DIAGNOSIS — E7800 Pure hypercholesterolemia, unspecified: Secondary | ICD-10-CM | POA: Diagnosis not present

## 2019-08-17 DIAGNOSIS — M545 Low back pain: Secondary | ICD-10-CM | POA: Diagnosis not present

## 2019-08-17 DIAGNOSIS — M629 Disorder of muscle, unspecified: Secondary | ICD-10-CM | POA: Diagnosis not present

## 2019-08-17 DIAGNOSIS — M2560 Stiffness of unspecified joint, not elsewhere classified: Secondary | ICD-10-CM | POA: Diagnosis not present

## 2019-08-18 DIAGNOSIS — E7800 Pure hypercholesterolemia, unspecified: Secondary | ICD-10-CM | POA: Diagnosis not present

## 2019-08-18 DIAGNOSIS — I10 Essential (primary) hypertension: Secondary | ICD-10-CM | POA: Diagnosis not present

## 2019-08-18 DIAGNOSIS — R55 Syncope and collapse: Secondary | ICD-10-CM | POA: Diagnosis not present

## 2019-08-19 DIAGNOSIS — M629 Disorder of muscle, unspecified: Secondary | ICD-10-CM | POA: Diagnosis not present

## 2019-08-19 DIAGNOSIS — M2560 Stiffness of unspecified joint, not elsewhere classified: Secondary | ICD-10-CM | POA: Diagnosis not present

## 2019-08-19 DIAGNOSIS — M545 Low back pain: Secondary | ICD-10-CM | POA: Diagnosis not present

## 2019-08-26 DIAGNOSIS — M545 Low back pain: Secondary | ICD-10-CM | POA: Diagnosis not present

## 2019-08-26 DIAGNOSIS — M629 Disorder of muscle, unspecified: Secondary | ICD-10-CM | POA: Diagnosis not present

## 2019-08-26 DIAGNOSIS — M2560 Stiffness of unspecified joint, not elsewhere classified: Secondary | ICD-10-CM | POA: Diagnosis not present

## 2019-08-27 DIAGNOSIS — M2560 Stiffness of unspecified joint, not elsewhere classified: Secondary | ICD-10-CM | POA: Diagnosis not present

## 2019-08-27 DIAGNOSIS — M545 Low back pain: Secondary | ICD-10-CM | POA: Diagnosis not present

## 2019-08-27 DIAGNOSIS — M629 Disorder of muscle, unspecified: Secondary | ICD-10-CM | POA: Diagnosis not present

## 2019-09-07 DIAGNOSIS — Z79899 Other long term (current) drug therapy: Secondary | ICD-10-CM | POA: Diagnosis not present

## 2019-09-07 DIAGNOSIS — Z6826 Body mass index (BMI) 26.0-26.9, adult: Secondary | ICD-10-CM | POA: Diagnosis not present

## 2019-09-07 DIAGNOSIS — M47817 Spondylosis without myelopathy or radiculopathy, lumbosacral region: Secondary | ICD-10-CM | POA: Diagnosis not present

## 2019-09-07 DIAGNOSIS — Z5181 Encounter for therapeutic drug level monitoring: Secondary | ICD-10-CM | POA: Diagnosis not present

## 2019-09-07 DIAGNOSIS — F1120 Opioid dependence, uncomplicated: Secondary | ICD-10-CM | POA: Diagnosis not present

## 2019-09-07 DIAGNOSIS — Z79891 Long term (current) use of opiate analgesic: Secondary | ICD-10-CM | POA: Diagnosis not present

## 2019-09-07 DIAGNOSIS — M545 Low back pain: Secondary | ICD-10-CM | POA: Diagnosis not present

## 2019-09-14 DIAGNOSIS — H1045 Other chronic allergic conjunctivitis: Secondary | ICD-10-CM | POA: Diagnosis not present

## 2019-09-14 DIAGNOSIS — J4520 Mild intermittent asthma, uncomplicated: Secondary | ICD-10-CM | POA: Diagnosis not present

## 2019-09-14 DIAGNOSIS — J300 Vasomotor rhinitis: Secondary | ICD-10-CM | POA: Diagnosis not present

## 2019-09-14 DIAGNOSIS — K219 Gastro-esophageal reflux disease without esophagitis: Secondary | ICD-10-CM | POA: Diagnosis not present

## 2019-09-23 DIAGNOSIS — F419 Anxiety disorder, unspecified: Secondary | ICD-10-CM | POA: Diagnosis not present

## 2019-10-11 DIAGNOSIS — M47817 Spondylosis without myelopathy or radiculopathy, lumbosacral region: Secondary | ICD-10-CM | POA: Diagnosis not present

## 2019-10-26 DIAGNOSIS — I10 Essential (primary) hypertension: Secondary | ICD-10-CM | POA: Diagnosis not present

## 2019-10-26 DIAGNOSIS — R55 Syncope and collapse: Secondary | ICD-10-CM | POA: Diagnosis not present

## 2019-10-26 DIAGNOSIS — F419 Anxiety disorder, unspecified: Secondary | ICD-10-CM | POA: Diagnosis not present

## 2019-10-26 DIAGNOSIS — G43A0 Cyclical vomiting, not intractable: Secondary | ICD-10-CM | POA: Diagnosis not present

## 2019-11-09 DIAGNOSIS — Z6826 Body mass index (BMI) 26.0-26.9, adult: Secondary | ICD-10-CM | POA: Diagnosis not present

## 2019-11-09 DIAGNOSIS — M4316 Spondylolisthesis, lumbar region: Secondary | ICD-10-CM | POA: Diagnosis not present

## 2019-11-09 DIAGNOSIS — M47817 Spondylosis without myelopathy or radiculopathy, lumbosacral region: Secondary | ICD-10-CM | POA: Diagnosis not present

## 2019-11-09 DIAGNOSIS — I10 Essential (primary) hypertension: Secondary | ICD-10-CM | POA: Diagnosis not present

## 2019-12-10 DIAGNOSIS — M47817 Spondylosis without myelopathy or radiculopathy, lumbosacral region: Secondary | ICD-10-CM | POA: Diagnosis not present

## 2019-12-10 DIAGNOSIS — M47816 Spondylosis without myelopathy or radiculopathy, lumbar region: Secondary | ICD-10-CM | POA: Diagnosis not present

## 2019-12-14 DIAGNOSIS — I10 Essential (primary) hypertension: Secondary | ICD-10-CM | POA: Diagnosis not present

## 2019-12-14 DIAGNOSIS — R55 Syncope and collapse: Secondary | ICD-10-CM | POA: Diagnosis not present

## 2019-12-14 DIAGNOSIS — E7800 Pure hypercholesterolemia, unspecified: Secondary | ICD-10-CM | POA: Diagnosis not present

## 2019-12-14 DIAGNOSIS — F419 Anxiety disorder, unspecified: Secondary | ICD-10-CM | POA: Diagnosis not present

## 2019-12-17 DIAGNOSIS — M47817 Spondylosis without myelopathy or radiculopathy, lumbosacral region: Secondary | ICD-10-CM | POA: Diagnosis not present

## 2019-12-20 ENCOUNTER — Ambulatory Visit: Payer: BC Managed Care – PPO | Attending: Internal Medicine

## 2019-12-20 DIAGNOSIS — Z20822 Contact with and (suspected) exposure to covid-19: Secondary | ICD-10-CM

## 2019-12-21 LAB — NOVEL CORONAVIRUS, NAA: SARS-CoV-2, NAA: NOT DETECTED

## 2019-12-23 DIAGNOSIS — G43719 Chronic migraine without aura, intractable, without status migrainosus: Secondary | ICD-10-CM | POA: Diagnosis not present

## 2019-12-23 DIAGNOSIS — G43019 Migraine without aura, intractable, without status migrainosus: Secondary | ICD-10-CM | POA: Diagnosis not present

## 2020-01-05 DIAGNOSIS — M48061 Spinal stenosis, lumbar region without neurogenic claudication: Secondary | ICD-10-CM | POA: Diagnosis not present

## 2020-01-05 DIAGNOSIS — Z6827 Body mass index (BMI) 27.0-27.9, adult: Secondary | ICD-10-CM | POA: Diagnosis not present

## 2020-01-05 DIAGNOSIS — I10 Essential (primary) hypertension: Secondary | ICD-10-CM | POA: Diagnosis not present

## 2020-01-20 DIAGNOSIS — M4316 Spondylolisthesis, lumbar region: Secondary | ICD-10-CM | POA: Diagnosis not present

## 2020-01-27 ENCOUNTER — Other Ambulatory Visit: Payer: Self-pay | Admitting: Neurological Surgery

## 2020-01-27 DIAGNOSIS — M4316 Spondylolisthesis, lumbar region: Secondary | ICD-10-CM

## 2020-01-29 ENCOUNTER — Ambulatory Visit
Admission: RE | Admit: 2020-01-29 | Discharge: 2020-01-29 | Disposition: A | Discharge status: Home / Self Care | Payer: BC Managed Care – PPO | Source: Ambulatory Visit | Attending: Neurological Surgery | Admitting: Neurological Surgery

## 2020-01-29 DIAGNOSIS — M4316 Spondylolisthesis, lumbar region: Secondary | ICD-10-CM

## 2020-01-29 DIAGNOSIS — M48061 Spinal stenosis, lumbar region without neurogenic claudication: Secondary | ICD-10-CM | POA: Diagnosis not present

## 2020-02-08 DIAGNOSIS — Z6828 Body mass index (BMI) 28.0-28.9, adult: Secondary | ICD-10-CM | POA: Diagnosis not present

## 2020-02-08 DIAGNOSIS — M4316 Spondylolisthesis, lumbar region: Secondary | ICD-10-CM | POA: Diagnosis not present

## 2020-02-08 DIAGNOSIS — I10 Essential (primary) hypertension: Secondary | ICD-10-CM | POA: Diagnosis not present

## 2020-02-14 ENCOUNTER — Ambulatory Visit: Payer: BC Managed Care – PPO

## 2020-02-22 ENCOUNTER — Other Ambulatory Visit: Payer: BC Managed Care – PPO

## 2020-02-22 ENCOUNTER — Ambulatory Visit: Payer: BC Managed Care – PPO

## 2020-02-24 ENCOUNTER — Ambulatory Visit: Payer: BC Managed Care – PPO

## 2020-02-26 ENCOUNTER — Ambulatory Visit: Payer: BC Managed Care – PPO

## 2020-02-28 DIAGNOSIS — Z01818 Encounter for other preprocedural examination: Secondary | ICD-10-CM | POA: Diagnosis not present

## 2020-02-28 DIAGNOSIS — M4316 Spondylolisthesis, lumbar region: Secondary | ICD-10-CM | POA: Diagnosis not present

## 2020-02-29 DIAGNOSIS — Z1152 Encounter for screening for COVID-19: Secondary | ICD-10-CM | POA: Diagnosis not present

## 2020-03-06 DIAGNOSIS — M4316 Spondylolisthesis, lumbar region: Secondary | ICD-10-CM | POA: Diagnosis not present

## 2020-03-06 DIAGNOSIS — M48061 Spinal stenosis, lumbar region without neurogenic claudication: Secondary | ICD-10-CM | POA: Diagnosis not present

## 2020-03-28 DIAGNOSIS — M7062 Trochanteric bursitis, left hip: Secondary | ICD-10-CM | POA: Diagnosis not present

## 2020-03-28 DIAGNOSIS — M7061 Trochanteric bursitis, right hip: Secondary | ICD-10-CM | POA: Diagnosis not present

## 2020-03-28 DIAGNOSIS — M4316 Spondylolisthesis, lumbar region: Secondary | ICD-10-CM | POA: Diagnosis not present

## 2020-05-04 DIAGNOSIS — M4316 Spondylolisthesis, lumbar region: Secondary | ICD-10-CM | POA: Diagnosis not present

## 2020-05-04 DIAGNOSIS — M7061 Trochanteric bursitis, right hip: Secondary | ICD-10-CM | POA: Diagnosis not present

## 2020-06-27 DIAGNOSIS — Z6828 Body mass index (BMI) 28.0-28.9, adult: Secondary | ICD-10-CM | POA: Diagnosis not present

## 2020-06-27 DIAGNOSIS — I10 Essential (primary) hypertension: Secondary | ICD-10-CM | POA: Diagnosis not present

## 2020-06-27 DIAGNOSIS — M7061 Trochanteric bursitis, right hip: Secondary | ICD-10-CM | POA: Diagnosis not present

## 2020-06-27 DIAGNOSIS — M546 Pain in thoracic spine: Secondary | ICD-10-CM | POA: Diagnosis not present

## 2020-07-14 DIAGNOSIS — B370 Candidal stomatitis: Secondary | ICD-10-CM | POA: Diagnosis not present

## 2020-07-14 DIAGNOSIS — E7800 Pure hypercholesterolemia, unspecified: Secondary | ICD-10-CM | POA: Diagnosis not present

## 2020-07-14 DIAGNOSIS — I10 Essential (primary) hypertension: Secondary | ICD-10-CM | POA: Diagnosis not present

## 2020-07-14 DIAGNOSIS — E559 Vitamin D deficiency, unspecified: Secondary | ICD-10-CM | POA: Diagnosis not present

## 2020-07-14 DIAGNOSIS — Z0000 Encounter for general adult medical examination without abnormal findings: Secondary | ICD-10-CM | POA: Diagnosis not present

## 2020-07-14 DIAGNOSIS — Z720 Tobacco use: Secondary | ICD-10-CM | POA: Diagnosis not present

## 2020-07-14 DIAGNOSIS — Z131 Encounter for screening for diabetes mellitus: Secondary | ICD-10-CM | POA: Diagnosis not present

## 2020-07-14 DIAGNOSIS — F419 Anxiety disorder, unspecified: Secondary | ICD-10-CM | POA: Diagnosis not present

## 2020-07-14 DIAGNOSIS — G43A0 Cyclical vomiting, not intractable: Secondary | ICD-10-CM | POA: Diagnosis not present

## 2020-08-08 DIAGNOSIS — Z1231 Encounter for screening mammogram for malignant neoplasm of breast: Secondary | ICD-10-CM | POA: Diagnosis not present

## 2020-08-11 ENCOUNTER — Other Ambulatory Visit: Payer: Self-pay | Admitting: Internal Medicine

## 2020-08-11 ENCOUNTER — Ambulatory Visit
Admission: RE | Admit: 2020-08-11 | Discharge: 2020-08-11 | Disposition: A | Discharge status: Home / Self Care | Payer: BC Managed Care – PPO | Source: Ambulatory Visit | Attending: Internal Medicine | Admitting: Internal Medicine

## 2020-08-11 DIAGNOSIS — R739 Hyperglycemia, unspecified: Secondary | ICD-10-CM | POA: Diagnosis not present

## 2020-08-11 DIAGNOSIS — Z981 Arthrodesis status: Secondary | ICD-10-CM | POA: Diagnosis not present

## 2020-08-11 DIAGNOSIS — Z1231 Encounter for screening mammogram for malignant neoplasm of breast: Secondary | ICD-10-CM | POA: Diagnosis not present

## 2020-08-11 DIAGNOSIS — Z720 Tobacco use: Secondary | ICD-10-CM | POA: Diagnosis not present

## 2020-08-11 DIAGNOSIS — R0989 Other specified symptoms and signs involving the circulatory and respiratory systems: Secondary | ICD-10-CM

## 2020-08-11 DIAGNOSIS — Z124 Encounter for screening for malignant neoplasm of cervix: Secondary | ICD-10-CM | POA: Diagnosis not present

## 2020-08-11 DIAGNOSIS — R0689 Other abnormalities of breathing: Secondary | ICD-10-CM | POA: Diagnosis not present

## 2020-08-11 DIAGNOSIS — F4321 Adjustment disorder with depressed mood: Secondary | ICD-10-CM | POA: Diagnosis not present

## 2020-08-15 DIAGNOSIS — H5213 Myopia, bilateral: Secondary | ICD-10-CM | POA: Diagnosis not present

## 2020-08-15 DIAGNOSIS — H40013 Open angle with borderline findings, low risk, bilateral: Secondary | ICD-10-CM | POA: Diagnosis not present

## 2020-08-15 DIAGNOSIS — H25013 Cortical age-related cataract, bilateral: Secondary | ICD-10-CM | POA: Diagnosis not present

## 2020-08-17 DIAGNOSIS — M4316 Spondylolisthesis, lumbar region: Secondary | ICD-10-CM | POA: Diagnosis not present

## 2020-08-23 DIAGNOSIS — G43019 Migraine without aura, intractable, without status migrainosus: Secondary | ICD-10-CM | POA: Diagnosis not present

## 2020-08-23 DIAGNOSIS — G43719 Chronic migraine without aura, intractable, without status migrainosus: Secondary | ICD-10-CM | POA: Diagnosis not present

## 2020-08-25 DIAGNOSIS — R262 Difficulty in walking, not elsewhere classified: Secondary | ICD-10-CM | POA: Diagnosis not present

## 2020-08-25 DIAGNOSIS — M545 Low back pain, unspecified: Secondary | ICD-10-CM | POA: Diagnosis not present

## 2020-08-25 DIAGNOSIS — M6281 Muscle weakness (generalized): Secondary | ICD-10-CM | POA: Diagnosis not present

## 2020-08-29 DIAGNOSIS — M6281 Muscle weakness (generalized): Secondary | ICD-10-CM | POA: Diagnosis not present

## 2020-08-29 DIAGNOSIS — R262 Difficulty in walking, not elsewhere classified: Secondary | ICD-10-CM | POA: Diagnosis not present

## 2020-08-29 DIAGNOSIS — M545 Low back pain, unspecified: Secondary | ICD-10-CM | POA: Diagnosis not present

## 2020-08-31 DIAGNOSIS — R262 Difficulty in walking, not elsewhere classified: Secondary | ICD-10-CM | POA: Diagnosis not present

## 2020-08-31 DIAGNOSIS — M545 Low back pain, unspecified: Secondary | ICD-10-CM | POA: Diagnosis not present

## 2020-08-31 DIAGNOSIS — M6281 Muscle weakness (generalized): Secondary | ICD-10-CM | POA: Diagnosis not present

## 2020-09-11 DIAGNOSIS — R262 Difficulty in walking, not elsewhere classified: Secondary | ICD-10-CM | POA: Diagnosis not present

## 2020-09-11 DIAGNOSIS — M545 Low back pain, unspecified: Secondary | ICD-10-CM | POA: Diagnosis not present

## 2020-09-11 DIAGNOSIS — M6281 Muscle weakness (generalized): Secondary | ICD-10-CM | POA: Diagnosis not present

## 2020-09-20 DIAGNOSIS — R262 Difficulty in walking, not elsewhere classified: Secondary | ICD-10-CM | POA: Diagnosis not present

## 2020-09-20 DIAGNOSIS — M6281 Muscle weakness (generalized): Secondary | ICD-10-CM | POA: Diagnosis not present

## 2020-09-20 DIAGNOSIS — M545 Low back pain, unspecified: Secondary | ICD-10-CM | POA: Diagnosis not present

## 2020-09-22 DIAGNOSIS — M545 Low back pain, unspecified: Secondary | ICD-10-CM | POA: Diagnosis not present

## 2020-09-22 DIAGNOSIS — M6281 Muscle weakness (generalized): Secondary | ICD-10-CM | POA: Diagnosis not present

## 2020-09-22 DIAGNOSIS — R262 Difficulty in walking, not elsewhere classified: Secondary | ICD-10-CM | POA: Diagnosis not present

## 2020-10-03 DIAGNOSIS — J0191 Acute recurrent sinusitis, unspecified: Secondary | ICD-10-CM | POA: Diagnosis not present

## 2020-11-24 ENCOUNTER — Ambulatory Visit: Payer: BC Managed Care – PPO

## 2021-01-16 DIAGNOSIS — J029 Acute pharyngitis, unspecified: Secondary | ICD-10-CM | POA: Diagnosis not present

## 2021-02-23 DIAGNOSIS — J019 Acute sinusitis, unspecified: Secondary | ICD-10-CM | POA: Diagnosis not present

## 2021-03-11 DIAGNOSIS — R5383 Other fatigue: Secondary | ICD-10-CM | POA: Diagnosis not present

## 2021-03-11 DIAGNOSIS — R0981 Nasal congestion: Secondary | ICD-10-CM | POA: Diagnosis not present

## 2021-03-11 DIAGNOSIS — R059 Cough, unspecified: Secondary | ICD-10-CM | POA: Diagnosis not present

## 2021-04-04 DIAGNOSIS — J3089 Other allergic rhinitis: Secondary | ICD-10-CM | POA: Diagnosis not present

## 2021-04-04 DIAGNOSIS — R519 Headache, unspecified: Secondary | ICD-10-CM | POA: Diagnosis not present

## 2021-04-04 DIAGNOSIS — R5383 Other fatigue: Secondary | ICD-10-CM | POA: Diagnosis not present

## 2021-04-04 DIAGNOSIS — J0111 Acute recurrent frontal sinusitis: Secondary | ICD-10-CM | POA: Diagnosis not present

## 2021-09-20 DIAGNOSIS — R8290 Unspecified abnormal findings in urine: Secondary | ICD-10-CM | POA: Diagnosis not present

## 2021-09-20 DIAGNOSIS — H9192 Unspecified hearing loss, left ear: Secondary | ICD-10-CM | POA: Diagnosis not present

## 2021-10-04 DIAGNOSIS — Z79899 Other long term (current) drug therapy: Secondary | ICD-10-CM | POA: Diagnosis not present

## 2021-10-04 DIAGNOSIS — Z0001 Encounter for general adult medical examination with abnormal findings: Secondary | ICD-10-CM | POA: Diagnosis not present

## 2021-10-04 DIAGNOSIS — M7061 Trochanteric bursitis, right hip: Secondary | ICD-10-CM | POA: Diagnosis not present

## 2021-10-04 DIAGNOSIS — E7800 Pure hypercholesterolemia, unspecified: Secondary | ICD-10-CM | POA: Diagnosis not present

## 2021-10-04 DIAGNOSIS — I10 Essential (primary) hypertension: Secondary | ICD-10-CM | POA: Diagnosis not present

## 2021-10-04 DIAGNOSIS — R739 Hyperglycemia, unspecified: Secondary | ICD-10-CM | POA: Diagnosis not present

## 2021-10-04 DIAGNOSIS — Z720 Tobacco use: Secondary | ICD-10-CM | POA: Diagnosis not present

## 2021-10-04 DIAGNOSIS — E559 Vitamin D deficiency, unspecified: Secondary | ICD-10-CM | POA: Diagnosis not present

## 2021-10-04 DIAGNOSIS — H9192 Unspecified hearing loss, left ear: Secondary | ICD-10-CM | POA: Diagnosis not present

## 2021-12-26 DIAGNOSIS — M7061 Trochanteric bursitis, right hip: Secondary | ICD-10-CM | POA: Diagnosis not present

## 2021-12-26 DIAGNOSIS — Z720 Tobacco use: Secondary | ICD-10-CM | POA: Diagnosis not present

## 2021-12-26 DIAGNOSIS — G43A0 Cyclical vomiting, not intractable: Secondary | ICD-10-CM | POA: Diagnosis not present

## 2021-12-26 DIAGNOSIS — F419 Anxiety disorder, unspecified: Secondary | ICD-10-CM | POA: Diagnosis not present

## 2022-01-04 DIAGNOSIS — Z03818 Encounter for observation for suspected exposure to other biological agents ruled out: Secondary | ICD-10-CM | POA: Diagnosis not present

## 2022-01-04 DIAGNOSIS — J3489 Other specified disorders of nose and nasal sinuses: Secondary | ICD-10-CM | POA: Diagnosis not present

## 2022-01-04 DIAGNOSIS — R062 Wheezing: Secondary | ICD-10-CM | POA: Diagnosis not present

## 2022-01-04 DIAGNOSIS — R051 Acute cough: Secondary | ICD-10-CM | POA: Diagnosis not present

## 2022-02-14 DIAGNOSIS — J209 Acute bronchitis, unspecified: Secondary | ICD-10-CM | POA: Diagnosis not present

## 2022-03-20 DIAGNOSIS — M7061 Trochanteric bursitis, right hip: Secondary | ICD-10-CM | POA: Diagnosis not present

## 2022-03-20 DIAGNOSIS — M25551 Pain in right hip: Secondary | ICD-10-CM | POA: Diagnosis not present

## 2022-03-20 DIAGNOSIS — M1611 Unilateral primary osteoarthritis, right hip: Secondary | ICD-10-CM | POA: Diagnosis not present

## 2022-04-15 ENCOUNTER — Emergency Department (HOSPITAL_COMMUNITY): Payer: BC Managed Care – PPO

## 2022-04-15 ENCOUNTER — Emergency Department (HOSPITAL_COMMUNITY)
Admission: EM | Admit: 2022-04-15 | Discharge: 2022-04-15 | Disposition: A | Discharge status: Home / Self Care | Payer: BC Managed Care – PPO | Attending: Emergency Medicine | Admitting: Emergency Medicine

## 2022-04-15 ENCOUNTER — Other Ambulatory Visit: Payer: Self-pay

## 2022-04-15 DIAGNOSIS — M545 Low back pain, unspecified: Secondary | ICD-10-CM | POA: Diagnosis not present

## 2022-04-15 DIAGNOSIS — M48061 Spinal stenosis, lumbar region without neurogenic claudication: Secondary | ICD-10-CM

## 2022-04-15 DIAGNOSIS — M5410 Radiculopathy, site unspecified: Secondary | ICD-10-CM

## 2022-04-15 DIAGNOSIS — R1115 Cyclical vomiting syndrome unrelated to migraine: Secondary | ICD-10-CM

## 2022-04-15 DIAGNOSIS — N879 Dysplasia of cervix uteri, unspecified: Secondary | ICD-10-CM | POA: Diagnosis not present

## 2022-04-15 DIAGNOSIS — I10 Essential (primary) hypertension: Secondary | ICD-10-CM | POA: Diagnosis not present

## 2022-04-15 DIAGNOSIS — M25551 Pain in right hip: Secondary | ICD-10-CM | POA: Diagnosis not present

## 2022-04-15 DIAGNOSIS — R531 Weakness: Secondary | ICD-10-CM | POA: Diagnosis not present

## 2022-04-15 DIAGNOSIS — R112 Nausea with vomiting, unspecified: Secondary | ICD-10-CM | POA: Diagnosis not present

## 2022-04-15 DIAGNOSIS — R079 Chest pain, unspecified: Secondary | ICD-10-CM | POA: Diagnosis not present

## 2022-04-15 LAB — COMPREHENSIVE METABOLIC PANEL
ALT: 16 U/L (ref 0–44)
AST: 24 U/L (ref 15–41)
Albumin: 4.5 g/dL (ref 3.5–5.0)
Alkaline Phosphatase: 76 U/L (ref 38–126)
Anion gap: 16 — ABNORMAL HIGH (ref 5–15)
BUN: 5 mg/dL — ABNORMAL LOW (ref 6–20)
CO2: 20 mmol/L — ABNORMAL LOW (ref 22–32)
Calcium: 10.3 mg/dL (ref 8.9–10.3)
Chloride: 100 mmol/L (ref 98–111)
Creatinine, Ser: 0.71 mg/dL (ref 0.44–1.00)
GFR, Estimated: >60 mL/min (ref >60)
Glucose, Bld: 146 mg/dL — ABNORMAL HIGH (ref 70–99)
Potassium: 3.7 mmol/L (ref 3.5–5.1)
Sodium: 136 mmol/L (ref 135–145)
Total Bilirubin: 0.4 mg/dL (ref 0.3–1.2)
Total Protein: 7.6 g/dL (ref 6.5–8.1)

## 2022-04-15 LAB — CBC WITH DIFFERENTIAL/PLATELET
Abs Immature Granulocytes: 0.07 10*3/uL (ref 0.00–0.07)
Basophils Absolute: 0.1 10*3/uL (ref 0.0–0.1)
Basophils Relative: 0 %
Eosinophils Absolute: 0.2 10*3/uL (ref 0.0–0.5)
Eosinophils Relative: 1 %
HCT: 45.4 % (ref 36.0–46.0)
Hemoglobin: 15.8 g/dL — ABNORMAL HIGH (ref 12.0–15.0)
Immature Granulocytes: 1 %
Lymphocytes Relative: 19 %
Lymphs Abs: 2.4 10*3/uL (ref 0.7–4.0)
MCH: 29.2 pg (ref 26.0–34.0)
MCHC: 34.8 g/dL (ref 30.0–36.0)
MCV: 83.9 fL (ref 80.0–100.0)
Monocytes Absolute: 0.6 10*3/uL (ref 0.1–1.0)
Monocytes Relative: 5 %
Neutro Abs: 9.4 10*3/uL — ABNORMAL HIGH (ref 1.7–7.7)
Neutrophils Relative %: 74 %
Platelets: 392 10*3/uL (ref 150–400)
RBC: 5.41 MIL/uL — ABNORMAL HIGH (ref 3.87–5.11)
RDW: 14.3 % (ref 11.5–15.5)
WBC: 12.6 10*3/uL — ABNORMAL HIGH (ref 4.0–10.5)
nRBC: 0 % (ref 0.0–0.2)

## 2022-04-15 LAB — URINALYSIS, ROUTINE W REFLEX MICROSCOPIC
Bilirubin Urine: NEGATIVE
Glucose, UA: 50 mg/dL — AB
Hgb urine dipstick: NEGATIVE
Ketones, ur: 20 mg/dL — AB
Leukocytes,Ua: NEGATIVE
Nitrite: NEGATIVE
Protein, ur: NEGATIVE mg/dL
Specific Gravity, Urine: 1.031 — ABNORMAL HIGH (ref 1.005–1.030)
pH: 8 (ref 5.0–8.0)

## 2022-04-15 LAB — LIPASE, BLOOD: Lipase: 20 U/L (ref 11–51)

## 2022-04-15 MED ORDER — DEXAMETHASONE SODIUM PHOSPHATE 10 MG/ML IJ SOLN
10.0000 mg | Freq: Once | INTRAMUSCULAR | Status: AC
  Administered 2022-04-15: 10 mg via INTRAVENOUS
  Filled 2022-04-15: qty 1

## 2022-04-15 MED ORDER — ONDANSETRON HCL 4 MG/2ML IJ SOLN
4.0000 mg | Freq: Once | INTRAMUSCULAR | Status: AC
  Administered 2022-04-15: 4 mg via INTRAVENOUS
  Filled 2022-04-15: qty 2

## 2022-04-15 MED ORDER — METHYLPREDNISOLONE 4 MG PO TBPK: ORAL_TABLET | ORAL | 0 refills | Status: DC

## 2022-04-15 MED ORDER — HYDROMORPHONE HCL 1 MG/ML IJ SOLN
1.0000 mg | Freq: Once | INTRAMUSCULAR | Status: DC
  Filled 2022-04-15: qty 1

## 2022-04-15 MED ORDER — PROCHLORPERAZINE EDISYLATE 10 MG/2ML IJ SOLN
10.0000 mg | Freq: Once | INTRAMUSCULAR | Status: AC
  Administered 2022-04-15: 10 mg via INTRAVENOUS
  Filled 2022-04-15: qty 2

## 2022-04-15 MED ORDER — FENTANYL CITRATE PF 50 MCG/ML IJ SOSY
50.0000 ug | PREFILLED_SYRINGE | Freq: Once | INTRAMUSCULAR | Status: AC
  Administered 2022-04-15: 50 ug via INTRAVENOUS
  Filled 2022-04-15: qty 1

## 2022-04-15 MED ORDER — OXYCODONE HCL 5 MG PO TABS: 5.0000 mg | ORAL_TABLET | ORAL | 0 refills | Status: DC | PRN

## 2022-04-15 MED ORDER — DIAZEPAM 5 MG/ML IJ SOLN
5.0000 mg | Freq: Once | INTRAMUSCULAR | Status: AC
  Administered 2022-04-15: 5 mg via INTRAVENOUS
  Filled 2022-04-15: qty 2

## 2022-04-15 MED ORDER — ONDANSETRON 4 MG PO TBDP: 4.0000 mg | ORAL_TABLET | Freq: Three times a day (TID) | ORAL | 0 refills | Status: DC | PRN

## 2022-04-15 MED ORDER — HYDROMORPHONE HCL 1 MG/ML IJ SOLN
1.0000 mg | Freq: Once | INTRAMUSCULAR | Status: AC
  Administered 2022-04-15: 1 mg via INTRAVENOUS
  Filled 2022-04-15: qty 1

## 2022-04-15 MED ORDER — IOHEXOL 350 MG/ML SOLN
100.0000 mL | Freq: Once | INTRAVENOUS | Status: AC | PRN
  Administered 2022-04-15: 100 mL via INTRAVENOUS

## 2022-04-15 MED ORDER — SODIUM CHLORIDE 0.9 % IV BOLUS
1000.0000 mL | Freq: Once | INTRAVENOUS | Status: AC
  Administered 2022-04-15: 1000 mL via INTRAVENOUS

## 2022-04-15 MED ORDER — HYDROMORPHONE HCL 1 MG/ML IJ SOLN
1.0000 mg | Freq: Once | INTRAMUSCULAR | Status: AC
  Administered 2022-04-15: 1 mg via INTRAVENOUS

## 2022-04-15 MED ORDER — DROPERIDOL 2.5 MG/ML IJ SOLN
1.2500 mg | Freq: Once | INTRAMUSCULAR | Status: AC
  Administered 2022-04-15: 1.25 mg via INTRAVENOUS
  Filled 2022-04-15: qty 2

## 2022-04-15 MED ORDER — DIPHENHYDRAMINE HCL 50 MG/ML IJ SOLN
25.0000 mg | Freq: Once | INTRAMUSCULAR | Status: AC
  Administered 2022-04-15: 25 mg via INTRAVENOUS
  Filled 2022-04-15: qty 1

## 2022-04-15 MED ORDER — HALOPERIDOL LACTATE 5 MG/ML IJ SOLN
4.0000 mg | Freq: Once | INTRAMUSCULAR | Status: DC
  Filled 2022-04-15: qty 1

## 2022-04-15 NOTE — Care Management (Signed)
ED RNCM received handoff from daytime RNCM concerning possible HH and DME needs.  Discussed with EDP patient stilll being worked-up. Possible admission as per EDP.  TOC team still available for any TOC needs   Michel Bickers RN, BSN  ED RN Care Manager [phone]

## 2022-04-15 NOTE — ED Provider Notes (Addendum)
[facility] EMERGENCY DEPARTMENT Provider Note   CSN: [id number] Arrival date & time: 04/15/22  1100     History  Chief Complaint  Patient presents with   Hip Pain   Emesis    Katie Chang is a 61 y.o. female.  Patient here with right hip pain and for ongoing cyclical vomiting syndrome symptoms.  Patient started to feel nausea and vomiting today.  Worsening pain in her right hip.  Denies any trauma.  She has been started on Celebrex for what is thought to be right hip bursitis.  She has a history of migraines, acid reflux.  She denies any loss of bowel or bladder.  Denies any traumas or falls.  Denies any abdominal pain, chest pain, shortness of breath.  Nothing has made it worse or better.  Walking does make it slightly worse.  The history is provided by the patient.      Home Medications Prior to Admission medications   Medication Sig Start Date End Date Taking? Authorizing Provider  albuterol (PROAIR HFA) 108 (90 BASE) MCG/ACT inhaler Inhale 2 puffs into the lungs every 6 (six) hours as needed for wheezing. 03/11/13   Sondra Barges, PA-C  atorvastatin (LIPITOR) 10 MG tablet Take 10 mg by mouth as directed. Monday, Wednesday, friday    [provider]  chlorthalidone (HYGROTON) 25 MG tablet Take 25 mg by mouth daily. 03/04/18   [provider]  cholecalciferol (VITAMIN D3) 25 MCG (1000 UT) tablet Take 2,000 Units by mouth daily.    [provider]  diclofenac (VOLTAREN) 75 MG EC tablet Take 75 mg by mouth 2 (two) times daily. 03/13/18   [provider]  esomeprazole (NEXIUM) 40 MG capsule Take 40 mg by mouth daily. 03/04/18   [provider]  fluticasone (FLONASE) 50 MCG/ACT nasal spray Place 2 sprays into both nostrils daily.    [provider]  levocetirizine (XYZAL) 5 MG tablet Take 5 mg by mouth every evening.    [provider]  LORazepam (ATIVAN) 1 MG tablet Take 1 mg by mouth at bedtime. 04/09/18    [provider]  magnesium hydroxide (MILK OF MAGNESIA) 400 MG/5ML suspension Take 30 mLs by mouth daily as needed for mild constipation.    [provider]  metoCLOPramide (REGLAN) 10 MG tablet Take 1 tablet (10 mg total) by mouth every 6 (six) hours as needed for nausea (or headache). 11/12/18   Dione Booze, MD  montelukast (SINGULAIR) 10 MG tablet TAKE ONE TABLET AT BEDTIME. Patient taking differently: TAKE 10 mg once daily 04/22/14   Ethelda Chick, MD  ondansetron (ZOFRAN) 4 MG tablet Take 4 mg by mouth daily as needed for nausea or vomiting.  02/19/18   [provider]  oxyCODONE-acetaminophen (PERCOCET/ROXICET) 5-325 MG tablet Take 1-2 tablets by mouth every 6 (six) hours as needed for moderate pain.  06/20/17   [provider]  polyethylene glycol (MIRALAX / GLYCOLAX) packet Take 17 g by mouth daily as needed for mild constipation.    [provider]  potassium chloride SA (K-DUR,KLOR-CON) 20 MEQ tablet Take 1 tablet (20 mEq total) by mouth 2 (two) times daily. 11/12/18   Dione Booze, MD  venlafaxine XR (EFFEXOR-XR) 37.5 MG 24 hr capsule Take 112.5 mg by mouth daily. 06/17/17   [provider]      Allergies    Avelox [moxifloxacin hcl in nacl]    Review of Systems   Review of Systems  Physical Exam  Updated Vital Signs BP (!) 173/145 (BP Location: Right Arm)   Pulse 68   Temp 98.3 F (36.8 C)   Resp 15   SpO2 100%  Physical Exam Vitals and nursing note reviewed.  Constitutional:      General: She is not in acute distress.    Appearance: She is well-developed. She is ill-appearing.  HENT:     Head: Normocephalic and atraumatic.     Mouth/Throat:     Mouth: Mucous membranes are moist.  Eyes:     Extraocular Movements: Extraocular movements intact.     Conjunctiva/sclera: Conjunctivae normal.     Pupils: Pupils are equal, round, and reactive to light.  Cardiovascular:     Rate and Rhythm: Normal rate and regular rhythm.      Pulses: Normal pulses.     Heart sounds: Normal heart sounds. No murmur heard. Pulmonary:     Effort: Pulmonary effort is normal. No respiratory distress.     Breath sounds: Normal breath sounds.  Abdominal:     Palpations: Abdomen is soft.     Tenderness: There is no abdominal tenderness.  Musculoskeletal:        General: Tenderness present. No swelling.     Cervical back: Neck supple.     Comments: Tenderness to the right hip  Skin:    General: Skin is warm and dry.     Capillary Refill: Capillary refill takes less than 2 seconds.  Neurological:     General: No focal deficit present.     Mental Status: She is alert and oriented to person, place, and time.     Cranial Nerves: No cranial nerve deficit.     Sensory: No sensory deficit.     Motor: No weakness.     Coordination: Coordination normal.     Comments: 5+ out of 5 strength throughout, normal sensation, strength exam is somewhat limited in the right lower extremity secondary to pain  Psychiatric:        Mood and Affect: Mood normal.    ED Results / Procedures / Treatments   Labs (all labs ordered are listed, but only abnormal results are displayed) Labs Reviewed  CBC WITH DIFFERENTIAL/PLATELET - Abnormal; Notable for the following components:      Result Value   WBC 12.6 (*)    RBC 5.41 (*)    Hemoglobin 15.8 (*)    Neutro Abs 9.4 (*)    All other components within normal limits  COMPREHENSIVE METABOLIC PANEL - Abnormal; Notable for the following components:   CO2 20 (*)    Glucose, Bld 146 (*)    BUN 5 (*)    Anion gap 16 (*)    All other components within normal limits  LIPASE, BLOOD  URINALYSIS, ROUTINE W REFLEX MICROSCOPIC    EKG None  Radiology DG Hip Unilat With Pelvis 2-3 Views Right  Result Date: 04/15/2022 CLINICAL DATA:  Right hip pain. No reported injury. Patient reports right hip bursitis. EXAM: DG HIP (WITH OR WITHOUT PELVIS) 2-3V RIGHT COMPARISON:  None Available. FINDINGS: No fracture or  bone lesion. Hip joints, SI joints and symphysis pubis are normally spaced and aligned. Partly imaged posterior fusion hardware in the lower lumbar spine. Soft tissues are unremarkable. IMPRESSION: No fracture, bone lesion or hip joint abnormality. Electronically Signed   By: Amie Portland M.D.   On: 04/15/2022 12:25    Procedures Procedures    Medications Ordered in ED Medications  ondansetron (ZOFRAN) injection 4 mg (4 mg  Intravenous Given 04/15/22 1143)  HYDROmorphone (DILAUDID) injection 1 mg (1 mg Intravenous Given 04/15/22 1143)  sodium chloride 0.9 % bolus 1,000 mL (0 mLs Intravenous Stopped 04/15/22 1418)  HYDROmorphone (DILAUDID) injection 1 mg (1 mg Intravenous Given 04/15/22 1232)  ondansetron (ZOFRAN) injection 4 mg (4 mg Intravenous Given 04/15/22 1314)  diazepam (VALIUM) injection 5 mg (5 mg Intravenous Given 04/15/22 1316)  dexamethasone (DECADRON) injection 10 mg (10 mg Intravenous Given 04/15/22 1315)    ED Course/ Medical Decision Making/ A&P                           Medical Decision Making Amount and/or Complexity of Data Reviewed Labs: ordered. Radiology: ordered.  Risk Prescription drug management.   Katie Chang is here with nausea and vomiting, right hip pain.  History of cyclical vomiting syndrome.  Recently started Celebrex for what was thought to be right hip bursitis.  She has chronic back pain and has had fusion to her low back in the past.  She states that she started with nausea and vomiting this morning and then with right hip pain.  Denies any weakness or numbness or loss of bowel or bladder.  She is tender in her low back when I push on her low back especially in the right paraspinal area and right gluteal area.  She has good pulses on exam.  She is got good strength and sensation but exam is limited due to significant pain.  She denies any trauma.  Differential diagnosis is likely sciatic pain versus inflammatory process in the hip, back spasm, pinched nerve.   She is also having nausea and vomiting but denies any abdominal pain.  Not sure if this is cyclical vomiting syndrome but could be dissection or other intra-abdominal process.  Given her extent of pain we will get a CT dissection study of her abdomen, chest, pelvis.  I have no concern for DVT or peripheral arterial process in her lower legs given that she has good pulses.  Will give IV Dilaudid, Valium, Decadron.  Will check CBC, BMP.  Will get x-ray of the right hip.  Per my review and interpretation of labs there is no significant anemia or electrolyte abnormality.  Hip x-ray per radiology report shows no acute process.  Patient awaiting CT dissection study of her chest, abdomen, pelvis.  Pain very mildly improving.  Suspect that if dissection study is unremarkable that she could be treated for back spasm/pinched nerve in the low back, right hip inflammation.  I have no concern for septic joint or cauda equina at this time.    Feeling slightly better but if dissection study is unremarkable may need to be admitted for uncontrollable nausea vomiting pain.  Not sure if her pain is triggering her cyclical vomiting syndrome.  She states that she has had this since she was 17.  Zofran has been the most successful antiemetics in the past.  She just got a dose of Valium and Decadron.  I am hoping the Valium will help with her nausea feeling as well.  Patient signed out to oncoming ED staff with patient pending reevaluation of her pain and nausea.  I have arranged for a walker to be at her house if she is comfortable going home.  This chart was dictated using voice recognition software.  Despite best efforts to proofread,  errors can occur which can change the documentation meaning.      Final Clinical Impression(s) /  ED Diagnoses Final diagnoses:  Pain of right hip  Acute right-sided low back pain, unspecified whether sciatica present    Rx / DC Orders ED Discharge Orders     None          Virgina Norfolk, DO 04/15/22 1416    Virgina Norfolk, DO 04/15/22 1440

## 2022-04-15 NOTE — ED Triage Notes (Signed)
Pt reports bursitis in R hip and pain is so severe today that it is making her vomit. Denies abdominal pain or any precipitating factors to her hip pain. Has taken Celebrex for the pain but still in pain. Pt crying in triage.

## 2022-04-15 NOTE — ED Provider Notes (Signed)
  Physical Exam  BP (!) 174/96 (BP Location: Right Wrist)   Pulse 83   Temp 98 F (36.7 C) (Oral)   Resp 17   SpO2 100%   Physical Exam  Procedures  Procedures  ED Course / MDM    Medical Decision Making Amount and/or Complexity of Data Reviewed Labs: ordered. Radiology: ordered.  Risk Prescription drug management.   Received care of patient from Dr. Lockie Mola.  Please see his note for prior history, physical and care.  Briefly this is a 61-year-old female who has had right hip pain for the past couple weeks and was diagnosed with a possible hip bursitis by Dr. Charlann Boxer.  She reports that initially she had felt better taking Celebrex, and then had to hold it due to a dental procedure and was put on ibuprofen and Tylenol, reports that over the last 2 days she has had significant worsening of her right hip pain.  She denies numbness, weakness, loss of control of bowel or bladder.  She denies fevers and trauma.  CTA dissection study with CT LSpine shows no acute findings on CTA, no fracture, does have moderate to severe right neural foraminal narrowing which could affect exiting right L5 nerve root .  She does not have pain with ROM of hip or palpation of trochanteric bursa on my exam and suspect pain secondary to radicular pain from neural foraminal narrowing.    Reviewed labs and imaging.  Suspect nausea and vomiting related to cyclic vomiting syndrome.  Improved after droperidol, compazine/benadryl. Is tolerating po.   Given rx for steroids, zofran, pain medications after review in [place] drug database.  Recommend follow up with PCP and her [facility] physician. Patient discharged in stable condition with understanding of reasons to return.          Alvira Monday, MD 04/16/22 1204

## 2022-04-16 NOTE — Discharge Planning (Signed)
Oletta Cohn, RN, BSN, [place] [phone] Pt qualifies for DME [facility]) rolling walker.  DME  ordered through [employer].  Jasmine of [facility] notified to deliver DME to pt room prior to D/C home.

## 2022-04-25 DIAGNOSIS — G43A0 Cyclical vomiting, not intractable: Secondary | ICD-10-CM | POA: Diagnosis not present

## 2022-04-25 DIAGNOSIS — M25551 Pain in right hip: Secondary | ICD-10-CM | POA: Diagnosis not present

## 2022-04-26 DIAGNOSIS — M7061 Trochanteric bursitis, right hip: Secondary | ICD-10-CM | POA: Diagnosis not present

## 2022-05-16 DIAGNOSIS — M48061 Spinal stenosis, lumbar region without neurogenic claudication: Secondary | ICD-10-CM | POA: Diagnosis not present

## 2022-05-16 DIAGNOSIS — Z6828 Body mass index (BMI) 28.0-28.9, adult: Secondary | ICD-10-CM | POA: Diagnosis not present

## 2022-05-28 ENCOUNTER — Encounter (HOSPITAL_COMMUNITY): Payer: Self-pay

## 2022-05-28 ENCOUNTER — Emergency Department (HOSPITAL_COMMUNITY)
Admission: EM | Admit: 2022-05-28 | Discharge: 2022-05-28 | Disposition: A | Discharge status: Home / Self Care | Payer: BC Managed Care – PPO | Attending: Emergency Medicine | Admitting: Emergency Medicine

## 2022-05-28 ENCOUNTER — Other Ambulatory Visit: Payer: Self-pay

## 2022-05-28 DIAGNOSIS — E860 Dehydration: Secondary | ICD-10-CM | POA: Diagnosis not present

## 2022-05-28 DIAGNOSIS — E876 Hypokalemia: Secondary | ICD-10-CM | POA: Diagnosis not present

## 2022-05-28 DIAGNOSIS — R1115 Cyclical vomiting syndrome unrelated to migraine: Secondary | ICD-10-CM | POA: Diagnosis not present

## 2022-05-28 DIAGNOSIS — Z79899 Other long term (current) drug therapy: Secondary | ICD-10-CM | POA: Insufficient documentation

## 2022-05-28 DIAGNOSIS — R1013 Epigastric pain: Secondary | ICD-10-CM | POA: Insufficient documentation

## 2022-05-28 DIAGNOSIS — R112 Nausea with vomiting, unspecified: Secondary | ICD-10-CM | POA: Diagnosis not present

## 2022-05-28 LAB — URINALYSIS, ROUTINE W REFLEX MICROSCOPIC
Bilirubin Urine: NEGATIVE
Glucose, UA: NEGATIVE mg/dL
Hgb urine dipstick: NEGATIVE
Ketones, ur: 80 mg/dL — AB
Leukocytes,Ua: NEGATIVE
Nitrite: NEGATIVE
Protein, ur: NEGATIVE mg/dL
Specific Gravity, Urine: 1.01 (ref 1.005–1.030)
pH: 8 (ref 5.0–8.0)

## 2022-05-28 LAB — CBC WITH DIFFERENTIAL/PLATELET
Abs Immature Granulocytes: 0.07 10*3/uL (ref 0.00–0.07)
Basophils Absolute: 0.1 10*3/uL (ref 0.0–0.1)
Basophils Relative: 0 %
Eosinophils Absolute: 0 10*3/uL (ref 0.0–0.5)
Eosinophils Relative: 0 %
HCT: 42.5 % (ref 36.0–46.0)
Hemoglobin: 15.2 g/dL — ABNORMAL HIGH (ref 12.0–15.0)
Immature Granulocytes: 1 %
Lymphocytes Relative: 16 %
Lymphs Abs: 1.9 10*3/uL (ref 0.7–4.0)
MCH: 28.8 pg (ref 26.0–34.0)
MCHC: 35.8 g/dL (ref 30.0–36.0)
MCV: 80.6 fL (ref 80.0–100.0)
Monocytes Absolute: 0.6 10*3/uL (ref 0.1–1.0)
Monocytes Relative: 5 %
Neutro Abs: 9.1 10*3/uL — ABNORMAL HIGH (ref 1.7–7.7)
Neutrophils Relative %: 78 %
Platelets: 496 10*3/uL — ABNORMAL HIGH (ref 150–400)
RBC: 5.27 MIL/uL — ABNORMAL HIGH (ref 3.87–5.11)
RDW: 14.4 % (ref 11.5–15.5)
WBC: 11.8 10*3/uL — ABNORMAL HIGH (ref 4.0–10.5)
nRBC: 0 % (ref 0.0–0.2)

## 2022-05-28 LAB — COMPREHENSIVE METABOLIC PANEL
ALT: 21 U/L (ref 0–44)
AST: 25 U/L (ref 15–41)
Albumin: 4.5 g/dL (ref 3.5–5.0)
Alkaline Phosphatase: 71 U/L (ref 38–126)
Anion gap: 20 — ABNORMAL HIGH (ref 5–15)
BUN: 5 mg/dL — ABNORMAL LOW (ref 6–20)
CO2: 17 mmol/L — ABNORMAL LOW (ref 22–32)
Calcium: 10.2 mg/dL (ref 8.9–10.3)
Chloride: 102 mmol/L (ref 98–111)
Creatinine, Ser: 0.76 mg/dL (ref 0.44–1.00)
GFR, Estimated: >60 mL/min (ref >60)
Glucose, Bld: 182 mg/dL — ABNORMAL HIGH (ref 70–99)
Potassium: 3.3 mmol/L — ABNORMAL LOW (ref 3.5–5.1)
Sodium: 139 mmol/L (ref 135–145)
Total Bilirubin: 0.6 mg/dL (ref 0.3–1.2)
Total Protein: 7.4 g/dL (ref 6.5–8.1)

## 2022-05-28 LAB — LIPASE, BLOOD: Lipase: 22 U/L (ref 11–51)

## 2022-05-28 MED ORDER — ONDANSETRON HCL 4 MG PO TABS
4.0000 mg | ORAL_TABLET | Freq: Once | ORAL | Status: AC
Start: 2022-05-28 — End: 2022-05-28
  Administered 2022-05-28: 4 mg via ORAL
  Filled 2022-05-28: qty 1

## 2022-05-28 MED ORDER — POTASSIUM CHLORIDE 20 MEQ PO PACK
40.0000 meq | PACK | Freq: Two times a day (BID) | ORAL | Status: DC
Start: 2022-05-28 — End: 2022-05-29

## 2022-05-28 MED ORDER — SODIUM CHLORIDE 0.9 % IV BOLUS
1000.0000 mL | Freq: Once | INTRAVENOUS | Status: AC
  Administered 2022-05-28: 1000 mL via INTRAVENOUS

## 2022-05-28 MED ORDER — DIPHENHYDRAMINE HCL 50 MG/ML IJ SOLN
12.5000 mg | Freq: Once | INTRAMUSCULAR | Status: AC
  Administered 2022-05-28: 12.5 mg via INTRAVENOUS
  Filled 2022-05-28: qty 1

## 2022-05-28 MED ORDER — MORPHINE SULFATE (PF) 4 MG/ML IV SOLN
6.0000 mg | Freq: Once | INTRAVENOUS | Status: AC
  Administered 2022-05-28: 6 mg via INTRAVENOUS
  Filled 2022-05-28: qty 2

## 2022-05-28 MED ORDER — OXYCODONE-ACETAMINOPHEN 5-325 MG PO TABS
1.0000 | ORAL_TABLET | Freq: Once | ORAL | Status: AC
  Administered 2022-05-28: 1 via ORAL
  Filled 2022-05-28: qty 1

## 2022-05-28 MED ORDER — ONDANSETRON 4 MG PO TBDP: 4.0000 mg | ORAL_TABLET | Freq: Three times a day (TID) | ORAL | 0 refills | Status: DC | PRN

## 2022-05-28 MED ORDER — METOCLOPRAMIDE HCL 5 MG/ML IJ SOLN
10.0000 mg | Freq: Once | INTRAMUSCULAR | Status: AC
  Administered 2022-05-28: 10 mg via INTRAVENOUS
  Filled 2022-05-28: qty 2

## 2022-05-28 NOTE — ED Provider Notes (Signed)
[facility] EMERGENCY DEPARTMENT Provider Note   CSN: [id number] Arrival date & time: 05/28/22  1134     History  Chief Complaint  Patient presents with   Emesis    Katie Chang is a 61 y.o. female.  Patient is a 61 year old female with past medical history of cyclical vomiting syndrome presenting for complaints of abdominal pain and vomiting.  Patient admits to epigastric pain, nausea, vomiting that started at 9 AM this morning.  Patient states " I need 10 mg of IV Dilaudid. It is the only thing that will make me feel better. I don't respond to anything else". Patient denies fevers or diarrhea.  States today's abdominal pain feels like her normal cyclical vomiting syndrome. Denies constipation.    The history is provided by the patient. No language interpreter was used.  Emesis Associated symptoms: abdominal pain   Associated symptoms: no arthralgias, no chills, no cough, no fever and no sore throat        Home Medications Prior to Admission medications   Medication Sig Start Date End Date Taking? Authorizing Provider  albuterol (PROAIR HFA) 108 (90 BASE) MCG/ACT inhaler Inhale 2 puffs into the lungs every 6 (six) hours as needed for wheezing. 03/11/13   Sondra Barges, PA-C  atorvastatin (LIPITOR) 10 MG tablet Take 10 mg by mouth as directed. Monday, Wednesday, friday    [provider]  chlorthalidone (HYGROTON) 25 MG tablet Take 25 mg by mouth daily. 03/04/18   [provider]  cholecalciferol (VITAMIN D3) 25 MCG (1000 UT) tablet Take 2,000 Units by mouth daily.    [provider]  diclofenac (VOLTAREN) 75 MG EC tablet Take 75 mg by mouth 2 (two) times daily. 03/13/18   [provider]  esomeprazole (NEXIUM) 40 MG capsule Take 40 mg by mouth daily. 03/04/18   [provider]  fluticasone (FLONASE) 50 MCG/ACT nasal spray Place 2 sprays into both nostrils daily.    [provider]  levocetirizine (XYZAL) 5 MG  tablet Take 5 mg by mouth every evening.    [provider]  LORazepam (ATIVAN) 1 MG tablet Take 1 mg by mouth at bedtime. 04/09/18   [provider]  magnesium hydroxide (MILK OF MAGNESIA) 400 MG/5ML suspension Take 30 mLs by mouth daily as needed for mild constipation.    [provider]  methylPREDNISolone (MEDROL DOSEPAK) 4 MG TBPK tablet See package instructions 04/15/22   Alvira Monday, MD  metoCLOPramide (REGLAN) 10 MG tablet Take 1 tablet (10 mg total) by mouth every 6 (six) hours as needed for nausea (or headache). 11/12/18   Dione Booze, MD  montelukast (SINGULAIR) 10 MG tablet TAKE ONE TABLET AT BEDTIME. Patient taking differently: TAKE 10 mg once daily 04/22/14   Ethelda Chick, MD  ondansetron (ZOFRAN) 4 MG tablet Take 4 mg by mouth daily as needed for nausea or vomiting.  02/19/18   [provider]  ondansetron (ZOFRAN-ODT) 4 MG disintegrating tablet Take 1 tablet (4 mg total) by mouth every 8 (eight) hours as needed for nausea or vomiting. 05/28/22   Edwin Dada P, DO  oxyCODONE (ROXICODONE) 5 MG immediate release tablet Take 1 tablet (5 mg total) by mouth every 4 (four) hours as needed for severe pain. 04/15/22   Alvira Monday, MD  polyethylene glycol (MIRALAX / GLYCOLAX) packet Take 17 g by mouth daily as needed for mild constipation.    [provider]  potassium chloride SA (K-DUR,KLOR-CON) 20 MEQ tablet Take 1  tablet (20 mEq total) by mouth 2 (two) times daily. 11/12/18   Dione Booze, MD  venlafaxine XR (EFFEXOR-XR) 37.5 MG 24 hr capsule Take 112.5 mg by mouth daily. 06/17/17   [provider]      Allergies    Avelox [moxifloxacin hcl in nacl] and Haldol [haloperidol lactate]    Review of Systems   Review of Systems  Constitutional:  Negative for chills and fever.  HENT:  Negative for ear pain and sore throat.   Eyes:  Negative for pain and visual disturbance.  Respiratory:  Negative for cough and shortness of breath.    Cardiovascular:  Negative for chest pain and palpitations.  Gastrointestinal:  Positive for abdominal pain, nausea and vomiting. Negative for constipation.  Genitourinary:  Negative for dysuria and hematuria.  Musculoskeletal:  Negative for arthralgias and back pain.  Skin:  Negative for color change and rash.  Neurological:  Negative for seizures and syncope.  All other systems reviewed and are negative.   Physical Exam Updated Vital Signs BP (!) 151/100 (BP Location: Left Arm)   Pulse 80   Temp 97.7 F (36.5 C) (Oral)   Resp 16   SpO2 100%  Physical Exam Vitals and nursing note reviewed.  Constitutional:      General: She is not in acute distress.    Appearance: She is well-developed.  HENT:     Head: Normocephalic and atraumatic.  Eyes:     Conjunctiva/sclera: Conjunctivae normal.  Cardiovascular:     Rate and Rhythm: Normal rate and regular rhythm.     Heart sounds: No murmur heard. Pulmonary:     Effort: Pulmonary effort is normal. No respiratory distress.     Breath sounds: Normal breath sounds.  Abdominal:     Palpations: Abdomen is soft.     Tenderness: There is abdominal tenderness in the epigastric area. There is no guarding or rebound.  Musculoskeletal:        General: No swelling.     Cervical back: Neck supple.  Skin:    General: Skin is warm and dry.     Capillary Refill: Capillary refill takes less than 2 seconds.  Neurological:     Mental Status: She is alert.     GCS: GCS eye subscore is 4. GCS verbal subscore is 5. GCS motor subscore is 6.  Psychiatric:        Mood and Affect: Mood normal.     ED Results / Procedures / Treatments   Labs (all labs ordered are listed, but only abnormal results are displayed) Labs Reviewed  COMPREHENSIVE METABOLIC PANEL - Abnormal; Notable for the following components:      Result Value   Potassium 3.3 (*)    CO2 17 (*)    Glucose, Bld 182 (*)    BUN 5 (*)    Anion gap 20 (*)    All other components within  normal limits  CBC WITH DIFFERENTIAL/PLATELET - Abnormal; Notable for the following components:   WBC 11.8 (*)    RBC 5.27 (*)    Hemoglobin 15.2 (*)    Platelets 496 (*)    Neutro Abs 9.1 (*)    All other components within normal limits  LIPASE, BLOOD  URINALYSIS, ROUTINE W REFLEX MICROSCOPIC    EKG None  Radiology No results found.  Procedures Procedures    Medications Ordered in ED Medications  potassium chloride (KLOR-CON) packet 40 mEq (has no administration in time range)  ondansetron (ZOFRAN) tablet 4 mg (4  mg Oral Given 05/28/22 1232)  oxyCODONE-acetaminophen (PERCOCET/ROXICET) 5-325 MG per tablet 1 tablet (1 tablet Oral Given 05/28/22 1232)  sodium chloride 0.9 % bolus 1,000 mL (1,000 mLs Intravenous New Bag/Given 05/28/22 1644)  metoCLOPramide (REGLAN) injection 10 mg (10 mg Intravenous Given 05/28/22 1639)  diphenhydrAMINE (BENADRYL) injection 12.5 mg (12.5 mg Intravenous Given 05/28/22 1638)  morphine (PF) 4 MG/ML injection 6 mg (6 mg Intravenous Given 05/28/22 1638)    ED Course/ Medical Decision Making/ A&P                           Medical Decision Making Risk Prescription drug management.   1:44 PM 61 year old female with past medical history of cyclical vomiting syndrome presenting for complaints of abdominal pain and vomiting.  Patient is alert and oriented x3, no acute distress, afebrile, stable vital signs.  Physical exam demonstrates generalized abdominal tenderness worse in the epigastric region.  IV fluids, Reglan, Benadryl, morphine given for symptomatic management.  Reevaluation patient admits to significant proving of symptoms.  Patient no longer vomiting.  Admits to resolution of abdominal pain.  Able to tolerate p.o.  She has stable laboratory studies with mild hypokalemia.  Able to tolerate by mouth at this time.  Oral potassium given.  Patient is otherwise stable for discharge at this time with close follow-up with GI specialist for continued  cyclical vomiting syndrome.  Zofran sent to pharmacy.  Patient in no distress and overall condition improved here in the ED. Detailed discussions were had with the patient regarding current findings, and need for close f/u with PCP or on call doctor. The patient has been instructed to return immediately if the symptoms worsen in any way for re-evaluation. Patient verbalized understanding and is in agreement with current care plan. All questions answered prior to discharge.         Final Clinical Impression(s) / ED Diagnoses Final diagnoses:  Dehydration  Hypokalemia  Cyclical vomiting    Rx / DC Orders ED Discharge Orders          Ordered    ondansetron (ZOFRAN-ODT) 4 MG disintegrating tablet  Every 8 hours PRN        05/28/22 1903              Franne Forts, DO 05/28/22 1903

## 2022-05-28 NOTE — ED Provider Triage Note (Signed)
Emergency Medicine Provider Triage Evaluation Note  Katie Chang , a 61 y.o. female  was evaluated in triage.  Pt complains of severe vomiting and abdominal pain that started this morning.  She states that she has cyclic vomiting syndrome flareup started earlier today.  Patient is extremely distressed in triage and she is screaming in pain.  States her pain is epigastric.  She is requesting Dilaudid and states that any other medication does not work for her.  She denies fever, chills.  Review of Systems  Positive: Abdominal pain, vomiting Negative:   Physical Exam  BP (!) 177/97 (BP Location: Right Arm)   Pulse 94   Temp (!) 97.4 F (36.3 C) (Oral)   Resp (!) 42   SpO2 100%  Gen:   Awake, screaming in pain Resp:  Normal effort  MSK:   Moves extremities without difficulty  Other:  Vomiting during evaluation  Medical Decision Making  Medically screening exam initiated at 12:22 PM.  Appropriate orders placed.  Katie Chang was informed that the remainder of the evaluation will be completed by another provider, this initial triage assessment does not replace that evaluation, and the importance of remaining in the ED until their evaluation is complete.     Katie Chang, [place] 05/28/22 1223

## 2022-05-28 NOTE — Discharge Instructions (Signed)
Please follow-up with your GI specialist of cyclical vomiting continues.  Zofran sent to your pharmacy.  Continue to hydrate with Pedialyte or Gatorade to keep your electrolytes up.

## 2022-05-28 NOTE — ED Triage Notes (Signed)
Pt arrived POV from home c/o a cyclic vomiting episode that started this morning. Pt is moaning and groaning and wants a room and bed now.

## 2022-05-30 ENCOUNTER — Other Ambulatory Visit: Payer: Self-pay

## 2022-05-30 ENCOUNTER — Observation Stay (HOSPITAL_BASED_OUTPATIENT_CLINIC_OR_DEPARTMENT_OTHER)
Admission: EM | Admit: 2022-05-30 | Discharge: 2022-05-31 | Disposition: A | Discharge status: Home / Self Care | Payer: BC Managed Care – PPO | Attending: Internal Medicine | Admitting: Internal Medicine

## 2022-05-30 ENCOUNTER — Encounter (HOSPITAL_BASED_OUTPATIENT_CLINIC_OR_DEPARTMENT_OTHER): Payer: Self-pay | Admitting: Emergency Medicine

## 2022-05-30 DIAGNOSIS — I10 Essential (primary) hypertension: Secondary | ICD-10-CM | POA: Insufficient documentation

## 2022-05-30 DIAGNOSIS — R9431 Abnormal electrocardiogram [ECG] [EKG]: Secondary | ICD-10-CM | POA: Insufficient documentation

## 2022-05-30 DIAGNOSIS — J45909 Unspecified asthma, uncomplicated: Secondary | ICD-10-CM | POA: Diagnosis not present

## 2022-05-30 DIAGNOSIS — R0689 Other abnormalities of breathing: Secondary | ICD-10-CM | POA: Diagnosis not present

## 2022-05-30 DIAGNOSIS — R1115 Cyclical vomiting syndrome unrelated to migraine: Secondary | ICD-10-CM

## 2022-05-30 DIAGNOSIS — E876 Hypokalemia: Secondary | ICD-10-CM | POA: Insufficient documentation

## 2022-05-30 DIAGNOSIS — R1084 Generalized abdominal pain: Secondary | ICD-10-CM | POA: Diagnosis not present

## 2022-05-30 DIAGNOSIS — Z79899 Other long term (current) drug therapy: Secondary | ICD-10-CM | POA: Diagnosis not present

## 2022-05-30 DIAGNOSIS — E871 Hypo-osmolality and hyponatremia: Secondary | ICD-10-CM | POA: Diagnosis not present

## 2022-05-30 DIAGNOSIS — F17210 Nicotine dependence, cigarettes, uncomplicated: Secondary | ICD-10-CM | POA: Diagnosis not present

## 2022-05-30 DIAGNOSIS — R112 Nausea with vomiting, unspecified: Principal | ICD-10-CM | POA: Insufficient documentation

## 2022-05-30 DIAGNOSIS — R0902 Hypoxemia: Secondary | ICD-10-CM | POA: Diagnosis not present

## 2022-05-30 LAB — CBC
HCT: 35.4 % — ABNORMAL LOW (ref 36.0–46.0)
Hemoglobin: 12.8 g/dL (ref 12.0–15.0)
MCH: 28.9 pg (ref 26.0–34.0)
MCHC: 36.2 g/dL — ABNORMAL HIGH (ref 30.0–36.0)
MCV: 79.9 fL — ABNORMAL LOW (ref 80.0–100.0)
Platelets: 364 10*3/uL (ref 150–400)
RBC: 4.43 MIL/uL (ref 3.87–5.11)
RDW: 14.1 % (ref 11.5–15.5)
WBC: 11.6 10*3/uL — ABNORMAL HIGH (ref 4.0–10.5)
nRBC: 0 % (ref 0.0–0.2)

## 2022-05-30 LAB — COMPREHENSIVE METABOLIC PANEL
ALT: 16 U/L (ref 0–44)
AST: 24 U/L (ref 15–41)
Albumin: 4.6 g/dL (ref 3.5–5.0)
Alkaline Phosphatase: 59 U/L (ref 38–126)
Anion gap: 13 (ref 5–15)
BUN: 6 mg/dL (ref 6–20)
CO2: 24 mmol/L (ref 22–32)
Calcium: 9.4 mg/dL (ref 8.9–10.3)
Chloride: 91 mmol/L — ABNORMAL LOW (ref 98–111)
Creatinine, Ser: 0.48 mg/dL (ref 0.44–1.00)
GFR, Estimated: >60 mL/min (ref >60)
Glucose, Bld: 113 mg/dL — ABNORMAL HIGH (ref 70–99)
Potassium: 2.8 mmol/L — ABNORMAL LOW (ref 3.5–5.1)
Sodium: 128 mmol/L — ABNORMAL LOW (ref 135–145)
Total Bilirubin: 0.7 mg/dL (ref 0.3–1.2)
Total Protein: 6.7 g/dL (ref 6.5–8.1)

## 2022-05-30 LAB — BASIC METABOLIC PANEL
Anion gap: 10 (ref 5–15)
BUN: 6 mg/dL (ref 6–20)
CO2: 24 mmol/L (ref 22–32)
Calcium: 8.8 mg/dL — ABNORMAL LOW (ref 8.9–10.3)
Chloride: 95 mmol/L — ABNORMAL LOW (ref 98–111)
Creatinine, Ser: 0.55 mg/dL (ref 0.44–1.00)
GFR, Estimated: >60 mL/min (ref >60)
Glucose, Bld: 96 mg/dL (ref 70–99)
Potassium: 2.9 mmol/L — ABNORMAL LOW (ref 3.5–5.1)
Sodium: 129 mmol/L — ABNORMAL LOW (ref 135–145)

## 2022-05-30 LAB — MAGNESIUM: Magnesium: 1.5 mg/dL — ABNORMAL LOW (ref 1.7–2.4)

## 2022-05-30 MED ORDER — METOCLOPRAMIDE HCL 5 MG/ML IJ SOLN
10.0000 mg | Freq: Once | INTRAMUSCULAR | Status: AC
  Administered 2022-05-30: 10 mg via INTRAVENOUS
  Filled 2022-05-30: qty 2

## 2022-05-30 MED ORDER — LORAZEPAM 2 MG/ML IJ SOLN
1.0000 mg | Freq: Once | INTRAMUSCULAR | Status: AC
  Administered 2022-05-30: 1 mg via INTRAVENOUS
  Filled 2022-05-30: qty 1

## 2022-05-30 MED ORDER — POTASSIUM CHLORIDE 10 MEQ/100ML IV SOLN
10.0000 meq | INTRAVENOUS | Status: AC
  Administered 2022-05-30 (×2): 10 meq via INTRAVENOUS
  Filled 2022-05-30: qty 100

## 2022-05-30 MED ORDER — SODIUM CHLORIDE 0.9 % IV SOLN: INTRAVENOUS | Status: AC

## 2022-05-30 MED ORDER — MAGNESIUM SULFATE 2 GM/50ML IV SOLN
2.0000 g | Freq: Once | INTRAVENOUS | Status: AC
  Administered 2022-05-30: 2 g via INTRAVENOUS
  Filled 2022-05-30: qty 50

## 2022-05-30 MED ORDER — LACTATED RINGERS IV BOLUS
1000.0000 mL | Freq: Once | INTRAVENOUS | Status: AC
  Administered 2022-05-30: 1000 mL via INTRAVENOUS

## 2022-05-30 MED ORDER — POTASSIUM CHLORIDE 10 MEQ/100ML IV SOLN
10.0000 meq | INTRAVENOUS | Status: DC
  Administered 2022-05-30: 10 meq via INTRAVENOUS
  Filled 2022-05-30: qty 100

## 2022-05-30 MED ORDER — HYDROMORPHONE HCL 1 MG/ML IJ SOLN
0.5000 mg | Freq: Once | INTRAMUSCULAR | Status: AC
  Administered 2022-05-30: 0.5 mg via INTRAVENOUS
  Filled 2022-05-30: qty 0.5

## 2022-05-30 MED ORDER — POTASSIUM CHLORIDE 10 MEQ/100ML IV SOLN
10.0000 meq | INTRAVENOUS | Status: AC
  Administered 2022-05-30 – 2022-05-31 (×4): 10 meq via INTRAVENOUS
  Filled 2022-05-30 (×4): qty 100

## 2022-05-30 MED ORDER — HYDROMORPHONE HCL 1 MG/ML IJ SOLN
0.5000 mg | Freq: Once | INTRAMUSCULAR | Status: AC
  Administered 2022-05-30: 0.5 mg via INTRAVENOUS
  Filled 2022-05-30: qty 1

## 2022-05-30 MED ORDER — ENOXAPARIN SODIUM 40 MG/0.4ML IJ SOSY
40.0000 mg | PREFILLED_SYRINGE | INTRAMUSCULAR | Status: DC
  Administered 2022-05-30: 40 mg via SUBCUTANEOUS
  Filled 2022-05-30: qty 0.4

## 2022-05-30 MED ORDER — HYDROMORPHONE HCL 1 MG/ML IJ SOLN
0.5000 mg | Freq: Four times a day (QID) | INTRAMUSCULAR | Status: DC | PRN
  Administered 2022-05-30 – 2022-05-31 (×2): 0.5 mg via INTRAVENOUS
  Filled 2022-05-30 (×2): qty 0.5

## 2022-05-30 MED ORDER — LORAZEPAM 2 MG/ML IJ SOLN
1.0000 mg | Freq: Four times a day (QID) | INTRAMUSCULAR | Status: DC | PRN
  Administered 2022-05-30 – 2022-05-31 (×2): 1 mg via INTRAVENOUS
  Filled 2022-05-30 (×2): qty 1

## 2022-05-30 NOTE — Progress Notes (Signed)
Accepted to medical telemetry bed  61 year old female with history of cyclic vomiting syndrome was seen in the ED on 7/11, symptoms partially improved and she was discharged home. Return back to the ED at [facility] today with similar symptoms, unable to hold down any food. Tachycardic Sodium 128, potassium 2.8, magnesium 1.5, WBC count 11.6 IV fluid, IV Dilaudid, IV Reglan, potassium replacement given  Accepted in observation status to medical telemetry bed for further monitoring and management.

## 2022-05-30 NOTE — Assessment & Plan Note (Signed)
Mild at 8.9. Has received IV Mg. Likely will improve on repeat check tomorrow.

## 2022-05-30 NOTE — ED Provider Notes (Signed)
[facility] EMERGENCY DEPT Provider Note   CSN: [id number] Arrival date & time: 05/30/22  7106     History  Chief Complaint  Patient presents with   Emesis    Katie Chang is a 61 y.o. female.   Emesis Patient presents actively vomiting and retching.  History of cyclic vomiting.  Seen in the ER yesterday for it.  Has been vomiting for a couple days now.  When patient asked why she thinks she flared up and if she was feeling better she said she was feeling little better yesterday but came back because she was not given Dilaudid in the ER.  Did not get the medicines filled.  States unless she gets Dilaudid it will not get any better.    Past Medical History:  Diagnosis Date   Allergy    Arthritis    Asthma    Cervical dysplasia    Cyclic vomiting syndrome    DDD (degenerative disc disease), cervical    Cervical and Lumbar   Depression    Eczema    GERD (gastroesophageal reflux disease)    Hypertension    Menopausal symptoms    Migraines    PONV (postoperative nausea and vomiting)    sometimes nausea   Recurrent sinus infections     Home Medications Prior to Admission medications   Medication Sig Start Date End Date Taking? Authorizing Provider  albuterol (PROAIR HFA) 108 (90 BASE) MCG/ACT inhaler Inhale 2 puffs into the lungs every 6 (six) hours as needed for wheezing. 03/11/13   Sondra Barges, PA-C  amLODipine (NORVASC) 5 MG tablet Take 5 mg by mouth daily. 03/29/22   [provider]  cholecalciferol (VITAMIN D3) 25 MCG (1000 UT) tablet Take 2,000 Units by mouth daily.    [provider]  esomeprazole (NEXIUM) 40 MG capsule Take 40 mg by mouth daily. 03/04/18   [provider]  fluticasone (FLONASE) 50 MCG/ACT nasal spray Place 2 sprays into both nostrils daily.    [provider]  levocetirizine (XYZAL) 5 MG tablet Take 5 mg by mouth every evening.    [provider]  LORazepam (ATIVAN) 1 MG tablet Take 1 mg by  mouth at bedtime. 04/09/18   [provider]  magnesium hydroxide (MILK OF MAGNESIA) 400 MG/5ML suspension Take 30 mLs by mouth daily as needed for mild constipation.    [provider]  methylPREDNISolone (MEDROL DOSEPAK) 4 MG TBPK tablet See package instructions 04/15/22   Alvira Monday, MD  metoprolol succinate (TOPROL-XL) 25 MG 24 hr tablet Take 25 mg by mouth daily. 04/12/22   [provider]  montelukast (SINGULAIR) 10 MG tablet TAKE ONE TABLET AT BEDTIME. Patient taking differently: TAKE 10 mg once daily 04/22/14   Ethelda Chick, MD  ondansetron (ZOFRAN) 4 MG tablet Take 4 mg by mouth daily as needed for nausea or vomiting.  02/19/18   [provider]  ondansetron (ZOFRAN-ODT) 4 MG disintegrating tablet Take 1 tablet (4 mg total) by mouth every 8 (eight) hours as needed for nausea or vomiting. 05/28/22   Edwin Dada P, DO  oxyCODONE (ROXICODONE) 5 MG immediate release tablet Take 1 tablet (5 mg total) by mouth every 4 (four) hours as needed for severe pain. 04/15/22   Alvira Monday, MD  rosuvastatin (CRESTOR) 10 MG tablet Take by mouth. 03/29/22   [provider]  valsartan (DIOVAN) 80 MG tablet Take 80 mg by mouth at bedtime. 03/07/22   [provider]  venlafaxine XR (  EFFEXOR-XR) 37.5 MG 24 hr capsule Take 112.5 mg by mouth daily. 06/17/17   [provider]      Allergies    Avelox [moxifloxacin hcl in nacl] and Haldol [haloperidol lactate]    Review of Systems   Review of Systems  Gastrointestinal:  Positive for vomiting.    Physical Exam Updated Vital Signs BP (!) 159/85   Pulse (!) 127   Temp 98.3 F (36.8 C) (Oral)   Resp (!) 29   Ht 5\' 5"  (1.651 m)   Wt 70.3 kg   SpO2 100%   BMI 25.79 kg/m  Physical Exam Vitals and nursing note reviewed.  Eyes:     General: No scleral icterus. Cardiovascular:     Rate and Rhythm: Regular rhythm.  Abdominal:     Tenderness: There is no abdominal tenderness.      Comments: Patient is loudly retching.  Neurological:     Mental Status: She is alert and oriented to person, place, and time.     ED Results / Procedures / Treatments   Labs (all labs ordered are listed, but only abnormal results are displayed) Labs Reviewed  CBC - Abnormal; Notable for the following components:      Result Value   WBC 11.6 (*)    HCT 35.4 (*)    MCV 79.9 (*)    MCHC 36.2 (*)    All other components within normal limits  COMPREHENSIVE METABOLIC PANEL - Abnormal; Notable for the following components:   Sodium 128 (*)    Potassium 2.8 (*)    Chloride 91 (*)    Glucose, Bld 113 (*)    All other components within normal limits  MAGNESIUM - Abnormal; Notable for the following components:   Magnesium 1.5 (*)    All other components within normal limits  URINALYSIS, ROUTINE W REFLEX MICROSCOPIC    EKG EKG Interpretation  Date/Time:  Thursday May 30 2022 09:11:16 EDT Ventricular Rate:  91 PR Interval:  163 QRS Duration: 97 QT Interval:  410 QTC Calculation: 505 R Axis:   71 Text Interpretation: Sinus rhythm Probable left atrial enlargement Left ventricular hypertrophy Borderline prolonged QT interval Confirmed by 06-08-1993 [phone]) on 05/30/2022 9:16:58 AM  Radiology No results found.  Procedures Procedures    Medications Ordered in ED Medications  magnesium sulfate IVPB 2 g 50 mL (2 g Intravenous New Bag/Given 05/30/22 1414)  metoCLOPramide (REGLAN) injection 10 mg (10 mg Intravenous Given 05/30/22 0920)  HYDROmorphone (DILAUDID) injection 0.5 mg (0.5 mg Intravenous Given 05/30/22 0923)  lactated ringers bolus 1,000 mL (0 mLs Intravenous Stopped 05/30/22 1045)  LORazepam (ATIVAN) injection 1 mg (1 mg Intravenous Given 05/30/22 0951)  potassium chloride 10 mEq in 100 mL IVPB (10 mEq Intravenous New Bag/Given 05/30/22 1311)    ED Course/ Medical Decision Making/ A&P                           Medical Decision Making Amount and/or Complexity of  Data Reviewed Labs: ordered.  Risk Prescription drug management.   Patient with nausea vomiting.  Seen yesterday at [facility] for the same.  History of cyclical vomiting although reviewing notes it appears that it is somewhat unclear cause.  Will check basic blood work.  We will give some Reglan and a small dose of Dilaudid to help just on her opiate receptors.  We will also give fluid bolus.  Lab work has resulted.  Has a hypokalemia and  hypomagnesemia.  Labs are worsened since yesterday.  Supplementing IV, however patient has continued to vomit.  Has been more comfortable after treatment but then given oral trial and began to vomit again and was witnessed by her staff.  With this I think she will require admission to the hospital.  Will discuss with hospitalist.         Final Clinical Impression(s) / ED Diagnoses Final diagnoses:  Cyclical vomiting  Hypokalemia  Hypomagnesemia    Rx / DC Orders ED Discharge Orders     None         Benjiman Core, MD 05/30/22 1449

## 2022-05-30 NOTE — Assessment & Plan Note (Signed)
QTc of 505. -We will avoid QT prolongation medication -Repeat EKG in the morning following repletion of electrolytes

## 2022-05-30 NOTE — ED Notes (Addendum)
Pt arrives  to ER via EMS from home with c/o vomiting  seen at [facility] yesterday for same and sent home , pt retching horribly and hyperventilating on arrival has 22 rt hand per ems states could  not get her rx filled yesterday for reglan, states that pain is making her vomiting worse

## 2022-05-30 NOTE — ED Notes (Signed)
Pt ambulated to bathroom. Pt took 2 swallows of water and vomit immediately ,small amount coffee grounds emesis. MD made aware

## 2022-05-30 NOTE — Assessment & Plan Note (Signed)
repleted with IV Mg 2g in ED. Check repeat in the morning.

## 2022-05-30 NOTE — ED Notes (Signed)
Pt cont to writhe, retch and hyperventilate

## 2022-05-30 NOTE — H&P (Signed)
History and Physical    Patient: Katie Chang HUD:[id number] DOB: 1961/03/05 DOA: 05/30/2022 DOS: the patient was seen and examined on 05/30/2022 PCP: Marden Noble, MD  Patient coming from: [facility]  Chief Complaint:  Chief Complaint  Patient presents with   Emesis   HPI: Katie Chang is a 61 y.o. female with medical history significant of cyclic vomiting syndrome, migraine, and asthma who presents with intractable nausea and vomiting.  She was recently evaluated in the ED on 7/11 for the same symptoms and had improvement following fluid bolus, Reglan, Benadryl, morphine for symptomatic management.  She was repleted with oral potassium for mild hypokalemia.  She was reportedly able to tolerate by mouth at time of discharge.  However woke up first thing this morning with retching and constant achy epigastric pain. States she has been dealing with cyclic vomiting since she was 61 years old.  She knows her triggers to be alcohol, constipation or not eating enough.  However reports that she has quit alcohol long time ago.  Had 3 bowel movements yesterday.  Thinks this episode was due to her not eating enough and watching her weight.  She follows with [facility] Dr. Kevan Ny.  She shows me an official letter from him stating that she does not have these episodes more than 1-2 times per year and that she does not have drug-seeking behavior.  He advised in the letter that she be given IV 2 mg of Dilaudid, IV 8 mg of Zofran and aggressive fluid management when she presents to ED.  Therefore she believes that since she was not given Dilaudid at her ED eval yesterday her symptoms would recur.  I have included letter below for review.  In ED, she was afebrile tachycardic and tachypneic at times with elevated blood pressure up to SBP of 170, room air. She was hyponatremic with sodium of 128, K of 2.8, magnesium 1.5, glucose of 113, normal creatinine of 0.48.  Has chronic  leukocytosis around her baseline 11.6.  Hemoglobin stable at 12.8. EKG shows prolonged QTc of 505.  She was given 1 L LR bolus, IV 2 g magnesium, IV 10 mEq x 2 of potassium, Reglan, IV Dilaudid, and multiple doses of IV Ativan prior to transfer here to [facility] for continued symptomatic management.  On my evaluation, patient was well-appearing and was eagerly eating multiple cups of Jell-O.  Reports that she feels improved without any nausea and has kept down ginger ale.  Denies any current abdominal pain.  Review of Systems: As mentioned in the history of present illness. All other systems reviewed and are negative. Past Medical History:  Diagnosis Date   Allergy    Arthritis    Asthma    Cervical dysplasia    Cyclic vomiting syndrome    DDD (degenerative disc disease), cervical    Cervical and Lumbar   Depression    Eczema    GERD (gastroesophageal reflux disease)    Hypertension    Menopausal symptoms    Migraines    PONV (postoperative nausea and vomiting)    sometimes nausea   Recurrent sinus infections    Past Surgical History:  Procedure Laterality Date   CERVICAL BIOPSY     Dr. Eda Paschal   COLONOSCOPY W/ POLYPECTOMY     COLPOSCOPY     ESOPHAGOGASTRODUODENOSCOPY (EGD) WITH PROPOFOL N/A 05/13/2016   Procedure: ESOPHAGOGASTRODUODENOSCOPY (EGD) WITH PROPOFOL;  Surgeon: Charolett Bumpers, MD;  Location: [facility] ENDOSCOPY;  Service: Endoscopy;  Laterality:  N/A;   KNEE SURGERY     NASAL SINUS SURGERY  1992   NECK SURGERY     TONSILLECTOMY  1990   Social History:  reports that she has been smoking cigarettes. She has a 15.00 pack-year smoking history. She has never used smokeless tobacco. She reports current drug use. Drug: Marijuana. She reports that she does not drink alcohol.  Allergies  Allergen Reactions   Avelox [Moxifloxacin Hcl In Nacl] Hives    No allergic reaction to Levaquin.   Haldol [Haloperidol Lactate] Other (See Comments)    "Night terrors"    Family  History  Problem Relation Age of Onset   Heart disease Mother    Hypertension Father    Diabetes Paternal Grandfather    Heart disease Paternal Grandfather    Stroke Maternal Grandmother    Stroke Maternal Grandfather    Breast cancer Maternal Aunt        Age 47's    Prior to Admission medications   Medication Sig Start Date End Date Taking? Authorizing Provider  amLODipine (NORVASC) 5 MG tablet Take 5 mg by mouth daily. 03/29/22  Yes [provider]  celecoxib (CELEBREX) 200 MG capsule Take 200 mg by mouth 3 (three) times daily. 05/23/22  Yes [provider]  cholecalciferol (VITAMIN D3) 25 MCG (1000 UT) tablet Take 2,000 Units by mouth daily.   Yes [provider]  esomeprazole (NEXIUM) 40 MG capsule Take 40 mg by mouth daily. 03/04/18  Yes [provider]  levocetirizine (XYZAL) 5 MG tablet Take 5 mg by mouth every evening.   Yes [provider]  LORazepam (ATIVAN) 1 MG tablet Take 1 mg by mouth at bedtime. 04/09/18  Yes [provider]  magnesium hydroxide (MILK OF MAGNESIA) 400 MG/5ML suspension Take 30 mLs by mouth daily as needed for mild constipation.   Yes [provider]  metoprolol succinate (TOPROL-XL) 25 MG 24 hr tablet Take 25 mg by mouth daily. 04/12/22  Yes [provider]  montelukast (SINGULAIR) 10 MG tablet TAKE ONE TABLET AT BEDTIME. 04/22/14  Yes Ethelda Chick, MD  ondansetron (ZOFRAN) 4 MG tablet Take 4 mg by mouth daily as needed for nausea or vomiting.  02/19/18  Yes [provider]  ondansetron (ZOFRAN-ODT) 4 MG disintegrating tablet Take 1 tablet (4 mg total) by mouth every 8 (eight) hours as needed for nausea or vomiting. 05/28/22  Yes Edwin Dada P, DO  rosuvastatin (CRESTOR) 10 MG tablet Take by mouth. 03/29/22  Yes [provider]  valsartan (DIOVAN) 80 MG tablet Take 80 mg by mouth at bedtime. 03/07/22  Yes [provider]  venlafaxine XR (EFFEXOR-XR) 37.5 MG 24 hr  capsule Take 112.5 mg by mouth daily. 06/17/17  Yes [provider]  albuterol (PROAIR HFA) 108 (90 BASE) MCG/ACT inhaler Inhale 2 puffs into the lungs every 6 (six) hours as needed for wheezing. 03/11/13   Sondra Barges, PA-C  fluticasone (FLONASE) 50 MCG/ACT nasal spray Place 2 sprays into both nostrils daily.    [provider]  methylPREDNISolone (MEDROL DOSEPAK) 4 MG TBPK tablet See package instructions Patient not taking: Reported on 05/30/2022 04/15/22   Alvira Monday, MD  oxyCODONE (ROXICODONE) 5 MG immediate release tablet Take 1 tablet (5 mg total) by mouth every 4 (four) hours as needed for severe pain. Patient not taking: Reported on 05/30/2022 04/15/22   Alvira Monday, MD     Physical Exam: Vitals:   05/30/22 1530 05/30/22 1600 05/30/22 1749 05/30/22  2049  BP: (!) 165/102 (!) 167/96 (!) 121/92 131/78  Pulse: 98 (!) 104 (!) 110 95  Resp: (!) 23 20 14 18   Temp:   98.1 F (36.7 C) 98.4 F (36.9 C)  TempSrc:    Oral  SpO2: 91% 97% 97% 100%  Weight:      Height:       Constitutional: NAD, calm, comfortable, well-appearing elderly female sitting upright in eating Jell-O Eyes: lids and conjunctivae normal ENMT: Mucous membranes are moist. Neck: normal, supple, Respiratory: clear to auscultation bilaterally, no wheezing, no crackles. Normal respiratory effort.   Cardiovascular: Regular rate and rhythm, no murmurs / rubs / gallops. No extremity edema.  Abdomen: no tenderness, no masses palpated.  No rebound tenderness, guarding or rigidity.  Bowel sounds positive.  Musculoskeletal: no clubbing / cyanosis. No joint deformity upper and lower extremities. Good ROM, no contractures. Normal muscle tone.  Skin: no rashes, lesions, ulcers.  Neurologic: CN 2-12 grossly intact.  Strength 5/5 in all 4.  Psychiatric: Normal judgment and insight. Alert and oriented x 3. Normal mood. Data Reviewed:  See HPI  Assessment and Plan: * Intractable nausea and vomiting -  Reports ongoing issue since she has been 17.  Reports that she gets triggered by alcohol, constipation or not eating enough.  However reports quitting alcohol for quite some time.  Had normal bowel movement yesterday x3.  Thinks recently she has not been eating well due to watching her weight. She also reports daily marijuana use and has continued to use throughout these episodes.  Does not think she has cannabis hyperemesis although I believe her marijuana use still is contributory some to her symptoms. -Continue fluid hydration overnight with as needed antiemetics  -Replete electrolytes as below --Follows with [facility] Dr. . Recommends continual follow up output  Hypocalcemia Mild at 8.9. Has received IV Mg. Likely will improve on repeat check tomorrow.  Prolonged QT interval QTc of 505. -We will avoid QT prolongation medication -Repeat EKG in the morning following repletion of electrolytes  Hyponatremia Sodium of 128 from a prior normal of 135 secondary to intractable vomiting. -repeat Na following 1L NS bolus shows slight improvement at 129 -keep on continuous fluid overnight  Hypomagnesemia repleted with IV Mg 2g in ED. Check repeat in the morning.   Hypokalemia K of 2.8. Repleted with IV Kevan Ny x 2 in ED but only mild improvement -give additional IV x4      Advance Care Planning:   Code Status: DNR -confirmed with pt  Consults: none  Family Communication: no family at bedside  Severity of Illness: The appropriate patient status for this patient is OBSERVATION. Observation status is judged to be reasonable and necessary in order to provide the required intensity of service to ensure the patient's safety. The patient's presenting symptoms, physical exam findings, and initial radiographic and laboratory data in the context of their medical condition is felt to place them at decreased risk for further clinical deterioration. Furthermore, it is anticipated that the  patient will be medically stable for discharge from the hospital within 2 midnights of admission.   Author: , DO 05/30/2022 9:08 PM  For on call review [email or web address].

## 2022-05-30 NOTE — Assessment & Plan Note (Addendum)
-   Reports ongoing issue since she has been 17.  Reports that she gets triggered by alcohol, constipation or not eating enough.  However reports quitting alcohol for quite some time.  Had normal bowel movement yesterday x3.  Thinks recently she has not been eating well due to watching her weight. She also reports daily marijuana use and has continued to use throughout these episodes.  Does not think she has cannabis hyperemesis although I believe her marijuana use still is contributory some to her symptoms. -Continue fluid hydration overnight with as needed antiemetics  -Replete electrolytes as below --Follows with [facility] Dr. Kevan Ny. Recommends continual follow up output

## 2022-05-30 NOTE — ED Notes (Signed)
Pt much better, sleeping

## 2022-05-30 NOTE — Assessment & Plan Note (Addendum)
K of 2.8. Repleted with IV x 2 in ED but only mild improvement -give additional IV x4

## 2022-05-30 NOTE — ED Notes (Signed)
Spoke to pts dad, who came by to see pt but she was asleep

## 2022-05-30 NOTE — ED Notes (Signed)
Updated patients sister Eber Jones via phone. [phone]

## 2022-05-30 NOTE — Assessment & Plan Note (Addendum)
Sodium of 128 from a prior normal of 135 secondary to intractable vomiting. -repeat Na following 1L NS bolus shows slight improvement at 129 -keep on continuous fluid overnight

## 2022-05-31 DIAGNOSIS — E876 Hypokalemia: Secondary | ICD-10-CM | POA: Diagnosis not present

## 2022-05-31 DIAGNOSIS — R112 Nausea with vomiting, unspecified: Secondary | ICD-10-CM | POA: Diagnosis not present

## 2022-05-31 DIAGNOSIS — E871 Hypo-osmolality and hyponatremia: Secondary | ICD-10-CM | POA: Diagnosis not present

## 2022-05-31 LAB — URINALYSIS, ROUTINE W REFLEX MICROSCOPIC
Bacteria, UA: NONE SEEN
Bilirubin Urine: NEGATIVE
Glucose, UA: NEGATIVE mg/dL
Hgb urine dipstick: NEGATIVE
Ketones, ur: NEGATIVE mg/dL
Leukocytes,Ua: NEGATIVE
Nitrite: NEGATIVE
Protein, ur: NEGATIVE mg/dL
Specific Gravity, Urine: 1.002 — ABNORMAL LOW (ref 1.005–1.030)
pH: 7 (ref 5.0–8.0)

## 2022-05-31 LAB — CBC
HCT: 35.5 % — ABNORMAL LOW (ref 36.0–46.0)
Hemoglobin: 12.4 g/dL (ref 12.0–15.0)
MCH: 28.8 pg (ref 26.0–34.0)
MCHC: 34.9 g/dL (ref 30.0–36.0)
MCV: 82.4 fL (ref 80.0–100.0)
Platelets: 350 10*3/uL (ref 150–400)
RBC: 4.31 MIL/uL (ref 3.87–5.11)
RDW: 14.7 % (ref 11.5–15.5)
WBC: 11.9 10*3/uL — ABNORMAL HIGH (ref 4.0–10.5)
nRBC: 0 % (ref 0.0–0.2)

## 2022-05-31 LAB — BASIC METABOLIC PANEL
Anion gap: 9 (ref 5–15)
BUN: <5 mg/dL — ABNORMAL LOW (ref 6–20)
CO2: 24 mmol/L (ref 22–32)
Calcium: 8.5 mg/dL — ABNORMAL LOW (ref 8.9–10.3)
Chloride: 100 mmol/L (ref 98–111)
Creatinine, Ser: 0.46 mg/dL (ref 0.44–1.00)
GFR, Estimated: >60 mL/min (ref >60)
Glucose, Bld: 119 mg/dL — ABNORMAL HIGH (ref 70–99)
Potassium: 3.2 mmol/L — ABNORMAL LOW (ref 3.5–5.1)
Sodium: 133 mmol/L — ABNORMAL LOW (ref 135–145)

## 2022-05-31 LAB — HIV ANTIBODY (ROUTINE TESTING W REFLEX): HIV Screen 4th Generation wRfx: NONREACTIVE

## 2022-05-31 LAB — MAGNESIUM: Magnesium: 2.3 mg/dL (ref 1.7–2.4)

## 2022-05-31 MED ORDER — SODIUM CHLORIDE 0.9 % IV SOLN: INTRAVENOUS | Status: DC

## 2022-05-31 MED ORDER — POTASSIUM CHLORIDE CRYS ER 20 MEQ PO TBCR
40.0000 meq | EXTENDED_RELEASE_TABLET | ORAL | Status: DC
  Administered 2022-05-31: 40 meq via ORAL
  Filled 2022-05-31: qty 2

## 2022-05-31 MED ORDER — NICOTINE 14 MG/24HR TD PT24
14.0000 mg | MEDICATED_PATCH | Freq: Every day | TRANSDERMAL | Status: DC
  Administered 2022-05-31: 14 mg via TRANSDERMAL
  Filled 2022-05-31: qty 1

## 2022-05-31 NOTE — Hospital Course (Signed)
61 y.o. female with medical history significant of cyclic vomiting syndrome, migraine, and asthma who presents with intractable nausea and vomiting. Pt was observed overnight for intractable n/v.

## 2022-05-31 NOTE — Progress Notes (Signed)
After eating jello and drinking ginger ale, pt started vomiting and with epigastric pain. PRN meds given.

## 2022-05-31 NOTE — Progress Notes (Signed)
EKG done this morning. Will not transmit online. Copy of EKG attached to chart

## 2022-05-31 NOTE — Discharge Summary (Signed)
Physician Discharge Summary   Patient: Katie Chang MRN: [id number] DOB: [birth year]  Admit date:     05/30/2022  Discharge date: 05/31/22  Discharge Physician: Rickey Barbara   PCP: Marden Noble, MD   Recommendations at discharge:    Follow up with PCP in 1-2 weeks  Discharge Diagnoses: Principal Problem:   Intractable nausea and vomiting Active Problems:   Hypokalemia   Hypomagnesemia   Hyponatremia   Prolonged QT interval   Hypocalcemia  Resolved Problems:   * No resolved hospital problems. *  Hospital Course: 61 y.o. female with medical history significant of cyclic vomiting syndrome, migraine, and asthma who presents with intractable nausea and vomiting. Pt was observed overnight for intractable n/v.   Assessment and Plan: * Intractable nausea and vomiting - Reports ongoing issue since she has been 17.  Reports that she gets triggered by alcohol, constipation or not eating enough.  However reports quitting alcohol for quite some time.  Had normal bowel movement yesterday x3.  Thinks recently she has not been eating well due to watching her weight. She also reports daily marijuana use and has continued to use throughout these episodes.  Does not think she has cannabis hyperemesis although I believe her marijuana use still is contributory some to her symptoms. -given IVF hydration  --Diet advanced to soft diet, pt tolerated   Hypocalcemia Mild. Remained stable   Prolonged QT interval QTc of 505. -Remained stable   Hyponatremia Sodium of 128 from a prior normal of 135 secondary to intractable vomiting. -repeat Na improved with hydration   Hypomagnesemia -Replaced   Hypokalemia -Replaced      Consultants:  Procedures performed:   Disposition: Home Diet recommendation:  Regular diet DISCHARGE MEDICATION: Allergies as of 05/31/2022       Reactions   Avelox [moxifloxacin Hcl In Nacl] Hives   No allergic reaction to Levaquin.   Haldol [haloperidol Lactate]  Other (See Comments)   "Night terrors"        Medication List     STOP taking these medications    methylPREDNISolone 4 MG Tbpk tablet Commonly known as: MEDROL DOSEPAK   oxyCODONE 5 MG immediate release tablet Commonly known as: Roxicodone       TAKE these medications    albuterol 108 (90 Base) MCG/ACT inhaler Commonly known as: ProAir HFA Inhale 2 puffs into the lungs every 6 (six) hours as needed for wheezing.   amLODipine 5 MG tablet Commonly known as: NORVASC Take 5 mg by mouth daily.   celecoxib 200 MG capsule Commonly known as: CELEBREX Take 200 mg by mouth 3 (three) times daily.   cholecalciferol 25 MCG (1000 UNIT) tablet Commonly known as: VITAMIN D3 Take 2,000 Units by mouth daily.   esomeprazole 40 MG capsule Commonly known as: NEXIUM Take 40 mg by mouth daily.   fluticasone 50 MCG/ACT nasal spray Commonly known as: FLONASE Place 2 sprays into both nostrils daily.   levocetirizine 5 MG tablet Commonly known as: XYZAL Take 5 mg by mouth every evening.   LORazepam 1 MG tablet Commonly known as: ATIVAN Take 1 mg by mouth at bedtime.   magnesium hydroxide 400 MG/5ML suspension Commonly known as: MILK OF MAGNESIA Take 30 mLs by mouth daily as needed for mild constipation.   metoprolol succinate 25 MG 24 hr tablet Commonly known as: TOPROL-XL Take 25 mg by mouth daily.   montelukast 10 MG tablet Commonly known as: SINGULAIR TAKE ONE TABLET AT BEDTIME.   ondansetron 4 MG disintegrating tablet  Commonly known as: ZOFRAN-ODT Take 1 tablet (4 mg total) by mouth every 8 (eight) hours as needed for nausea or vomiting.   ondansetron 4 MG tablet Commonly known as: ZOFRAN Take 4 mg by mouth daily as needed for nausea or vomiting.   rosuvastatin 10 MG tablet Commonly known as: CRESTOR Take by mouth.   valsartan 80 MG tablet Commonly known as: DIOVAN Take 80 mg by mouth at bedtime.   venlafaxine XR 37.5 MG 24 hr capsule Commonly known as:  EFFEXOR-XR Take 112.5 mg by mouth daily.        Discharge Exam: Filed Weights   05/30/22 4656  Weight: 70.3 kg   General exam: Awake, laying in bed, in nad Respiratory system: Normal respiratory effort, no wheezing Cardiovascular system: regular rate, s1, s2 Gastrointestinal system: Soft, nondistended, positive BS Central nervous system: CN2-12 grossly intact, strength intact Extremities: Perfused, no clubbing Skin: Normal skin turgor, no notable skin lesions seen Psychiatry: Mood normal // no visual hallucinations   Condition at discharge: fair  The results of significant diagnostics from this hospitalization (including imaging, microbiology, ancillary and laboratory) are listed below for reference.   Imaging Studies: No results found.  Microbiology: Results for orders placed or performed in visit on 12/20/19  Novel Coronavirus, NAA ([facility])     Status: None   Collection Time: 12/20/19 11:55 AM   Specimen: Nasopharyngeal(NP) swabs in vial transport medium   NASOPHARYNGE  TESTING  Result Value Ref Range Status   SARS-CoV-2, NAA Not Detected Not Detected Final    Comment: This nucleic acid amplification test was developed and its performance characteristics determined by [employer]. Nucleic acid amplification tests include RT-PCR and TMA. This test has not been FDA cleared or approved. This test has been authorized by FDA under an Emergency Use Authorization (EUA). This test is only authorized for the duration of time the declaration that circumstances exist justifying the authorization of the emergency use of in vitro diagnostic tests for detection of SARS-CoV-2 virus and/or diagnosis of COVID-19 infection under section 564(b)(1) of the Act, 21 U.S.C. 812XNT-7(G) (1), unless the authorization is terminated or revoked sooner. When diagnostic testing is negative, the possibility of a false negative result should be considered in the context of a  patient's recent exposures and the presence of clinical signs and symptoms consistent with COVID-19. An individual without symptoms of COVID-19 and who is not shedding SARS-CoV-2 virus wo uld expect to have a negative (not detected) result in this assay.     Labs: CBC: Recent Labs  Lab 05/28/22 1228 05/30/22 1033 05/31/22 0501  WBC 11.8* 11.6* 11.9*  NEUTROABS 9.1*  --   --   HGB 15.2* 12.8 12.4  HCT 42.5 35.4* 35.5*  MCV 80.6 79.9* 82.4  PLT 496* 364 350   Basic Metabolic Panel: Recent Labs  Lab 05/28/22 1228 05/30/22 1055 05/30/22 1905 05/31/22 0501  NA 139 128* 129* 133*  K 3.3* 2.8* 2.9* 3.2*  CL 102 91* 95* 100  CO2 17* 24 24 24   GLUCOSE 182* 113* 96 119*  BUN 5* 6 6 <5*  CREATININE 0.76 0.48 0.55 0.46  CALCIUM 10.2 9.4 8.8* 8.5*  MG  --  1.5*  --  2.3   Liver Function Tests: Recent Labs  Lab 05/28/22 1228 05/30/22 1055  AST 25 24  ALT 21 16  ALKPHOS 71 59  BILITOT 0.6 0.7  PROT 7.4 6.7  ALBUMIN 4.5 4.6   CBG: No results for input(s): "GLUCAP" in the last  168 hours.  Discharge time spent: less than 30 minutes.  Signed: Rickey Barbara, MD [facility] 05/31/2022

## 2022-05-31 NOTE — Progress Notes (Signed)
Reviewed written d/c instructions w pt and all questions answered. She verbalized understanding. D/C ambulatory w all belongings in stable condition. 

## 2022-05-31 NOTE — Progress Notes (Signed)
Transition of Care [facility]) Screening Note  Patient Details  Name: Katie Chang Date of Birth: [birth year]  Transition of Care [facility]) CM/SW Contact:    Ewing Schlein, LCSW Phone Number: 05/31/2022, 10:47 AM  Transition of Care Department [facility]) has reviewed patient and no TOC needs have been identified at this time. We will continue to monitor patient advancement through interdisciplinary progression rounds. If new patient transition needs arise, please place a TOC consult.

## 2022-08-28 ENCOUNTER — Encounter (HOSPITAL_BASED_OUTPATIENT_CLINIC_OR_DEPARTMENT_OTHER): Payer: Self-pay

## 2022-08-28 ENCOUNTER — Emergency Department (HOSPITAL_BASED_OUTPATIENT_CLINIC_OR_DEPARTMENT_OTHER)
Admission: EM | Admit: 2022-08-28 | Discharge: 2022-08-28 | Disposition: A | Discharge status: Home / Self Care | Payer: BC Managed Care – PPO | Attending: Emergency Medicine | Admitting: Emergency Medicine

## 2022-08-28 ENCOUNTER — Other Ambulatory Visit: Payer: Self-pay

## 2022-08-28 DIAGNOSIS — R1115 Cyclical vomiting syndrome unrelated to migraine: Secondary | ICD-10-CM | POA: Diagnosis not present

## 2022-08-28 DIAGNOSIS — D72829 Elevated white blood cell count, unspecified: Secondary | ICD-10-CM | POA: Insufficient documentation

## 2022-08-28 DIAGNOSIS — R1110 Vomiting, unspecified: Secondary | ICD-10-CM | POA: Diagnosis not present

## 2022-08-28 DIAGNOSIS — Z79899 Other long term (current) drug therapy: Secondary | ICD-10-CM | POA: Diagnosis not present

## 2022-08-28 DIAGNOSIS — E876 Hypokalemia: Secondary | ICD-10-CM | POA: Insufficient documentation

## 2022-08-28 LAB — COMPREHENSIVE METABOLIC PANEL
ALT: 9 U/L (ref 0–44)
AST: 17 U/L (ref 15–41)
Albumin: 5.1 g/dL — ABNORMAL HIGH (ref 3.5–5.0)
Alkaline Phosphatase: 76 U/L (ref 38–126)
Anion gap: 17 — ABNORMAL HIGH (ref 5–15)
BUN: 7 mg/dL — ABNORMAL LOW (ref 8–23)
CO2: 19 mmol/L — ABNORMAL LOW (ref 22–32)
Calcium: 10.5 mg/dL — ABNORMAL HIGH (ref 8.9–10.3)
Chloride: 95 mmol/L — ABNORMAL LOW (ref 98–111)
Creatinine, Ser: 0.68 mg/dL (ref 0.44–1.00)
GFR, Estimated: >60 mL/min (ref >60)
Glucose, Bld: 146 mg/dL — ABNORMAL HIGH (ref 70–99)
Potassium: 3.2 mmol/L — ABNORMAL LOW (ref 3.5–5.1)
Sodium: 131 mmol/L — ABNORMAL LOW (ref 135–145)
Total Bilirubin: 0.6 mg/dL (ref 0.3–1.2)
Total Protein: 8.3 g/dL — ABNORMAL HIGH (ref 6.5–8.1)

## 2022-08-28 LAB — CBC
HCT: 43.5 % (ref 36.0–46.0)
Hemoglobin: 15 g/dL (ref 12.0–15.0)
MCH: 27.4 pg (ref 26.0–34.0)
MCHC: 34.5 g/dL (ref 30.0–36.0)
MCV: 79.5 fL — ABNORMAL LOW (ref 80.0–100.0)
Platelets: 419 10*3/uL — ABNORMAL HIGH (ref 150–400)
RBC: 5.47 MIL/uL — ABNORMAL HIGH (ref 3.87–5.11)
RDW: 14.2 % (ref 11.5–15.5)
WBC: 12.6 10*3/uL — ABNORMAL HIGH (ref 4.0–10.5)
nRBC: 0 % (ref 0.0–0.2)

## 2022-08-28 LAB — LIPASE, BLOOD: Lipase: <10 U/L — ABNORMAL LOW (ref 11–51)

## 2022-08-28 MED ORDER — ONDANSETRON 4 MG PO TBDP
4.0000 mg | ORAL_TABLET | Freq: Once | ORAL | Status: AC
  Administered 2022-08-28: 4 mg via ORAL
  Filled 2022-08-28: qty 1

## 2022-08-28 MED ORDER — HYDROMORPHONE HCL 1 MG/ML IJ SOLN
1.0000 mg | Freq: Once | INTRAMUSCULAR | Status: AC
  Administered 2022-08-28: 1 mg via INTRAVENOUS
  Filled 2022-08-28: qty 1

## 2022-08-28 MED ORDER — DIPHENHYDRAMINE HCL 50 MG/ML IJ SOLN
25.0000 mg | Freq: Once | INTRAMUSCULAR | Status: AC
Start: 2022-08-28 — End: 2022-08-28
  Administered 2022-08-28: 25 mg via INTRAVENOUS
  Filled 2022-08-28: qty 1

## 2022-08-28 MED ORDER — METOCLOPRAMIDE HCL 5 MG/ML IJ SOLN
10.0000 mg | Freq: Once | INTRAMUSCULAR | Status: AC
  Administered 2022-08-28: 10 mg via INTRAVENOUS
  Filled 2022-08-28: qty 2

## 2022-08-28 MED ORDER — POTASSIUM CHLORIDE CRYS ER 20 MEQ PO TBCR
40.0000 meq | EXTENDED_RELEASE_TABLET | Freq: Once | ORAL | Status: AC
  Administered 2022-08-28: 40 meq via ORAL
  Filled 2022-08-28: qty 2

## 2022-08-28 MED ORDER — LORAZEPAM 2 MG/ML IJ SOLN
1.0000 mg | Freq: Once | INTRAMUSCULAR | Status: AC
Start: 2022-08-28 — End: 2022-08-28
  Administered 2022-08-28: 1 mg via INTRAVENOUS
  Filled 2022-08-28: qty 1

## 2022-08-28 MED ORDER — SODIUM CHLORIDE 0.9 % IV BOLUS
1000.0000 mL | Freq: Once | INTRAVENOUS | Status: AC
  Administered 2022-08-28: 1000 mL via INTRAVENOUS

## 2022-08-28 MED ORDER — ONDANSETRON 4 MG PO TBDP: 4.0000 mg | ORAL_TABLET | Freq: Three times a day (TID) | ORAL | 0 refills | Status: DC | PRN

## 2022-08-28 NOTE — ED Notes (Signed)
Patient assessed in Waiting Area and still have severe N/V. PO Zofran given during Wait to attempt to alleviate Symptoms.

## 2022-08-28 NOTE — ED Provider Notes (Signed)
[facility] EMERGENCY DEPT Provider Note   CSN: [id number] Arrival date & time: 08/28/22  1126     History  Chief Complaint  Patient presents with   Emesis    Katie Chang is a 61 y.o. female.  Patient reports she has cyclical vomiting syndrome patient reports she has been unable to control her nausea and vomiting at home.  She reports that she has had this since she was 61 years old.  Patient is a THC user.  Patient has been evaluated by GI in the past.  Patient is currently followed by Dr. Kevan Ny.  The history is provided by the patient. No language interpreter was used.  Emesis Severity:  Severe Timing:  Constant Progression:  Worsening Chronicity:  New Relieved by:  Nothing Worsened by:  Nothing Associated symptoms: abdominal pain        Home Medications Prior to Admission medications   Medication Sig Start Date End Date Taking? Authorizing Provider  ondansetron (ZOFRAN-ODT) 4 MG disintegrating tablet Take 1 tablet (4 mg total) by mouth every 8 (eight) hours as needed for nausea or vomiting. 08/28/22  Yes Cheron Schaumann K, PA-C  albuterol (PROAIR HFA) 108 (90 BASE) MCG/ACT inhaler Inhale 2 puffs into the lungs every 6 (six) hours as needed for wheezing. 03/11/13   Sondra Barges, PA-C  amLODipine (NORVASC) 5 MG tablet Take 5 mg by mouth daily. 03/29/22   [provider]  celecoxib (CELEBREX) 200 MG capsule Take 200 mg by mouth 3 (three) times daily. 05/23/22   [provider]  cholecalciferol (VITAMIN D3) 25 MCG (1000 UT) tablet Take 2,000 Units by mouth daily.    [provider]  esomeprazole (NEXIUM) 40 MG capsule Take 40 mg by mouth daily. 03/04/18   [provider]  fluticasone (FLONASE) 50 MCG/ACT nasal spray Place 2 sprays into both nostrils daily.    [provider]  levocetirizine (XYZAL) 5 MG tablet Take 5 mg by mouth every evening.    [provider]  LORazepam (ATIVAN) 1 MG tablet Take 1 mg by mouth  at bedtime. 04/09/18   [provider]  magnesium hydroxide (MILK OF MAGNESIA) 400 MG/5ML suspension Take 30 mLs by mouth daily as needed for mild constipation.    [provider]  metoprolol succinate (TOPROL-XL) 25 MG 24 hr tablet Take 25 mg by mouth daily. 04/12/22   [provider]  montelukast (SINGULAIR) 10 MG tablet TAKE ONE TABLET AT BEDTIME. 04/22/14   Ethelda Chick, MD  ondansetron (ZOFRAN) 4 MG tablet Take 4 mg by mouth daily as needed for nausea or vomiting.  02/19/18   [provider]  rosuvastatin (CRESTOR) 10 MG tablet Take by mouth. 03/29/22   [provider]  valsartan (DIOVAN) 80 MG tablet Take 80 mg by mouth at bedtime. 03/07/22   [provider]  venlafaxine XR (EFFEXOR-XR) 37.5 MG 24 hr capsule Take 112.5 mg by mouth daily. 06/17/17   [provider]      Allergies    Avelox [moxifloxacin hcl in nacl] and Haldol [haloperidol lactate]    Review of Systems   Review of Systems  Gastrointestinal:  Positive for abdominal pain and vomiting.  All other systems reviewed and are negative.   Physical Exam Updated Vital Signs BP (!) 167/86   Pulse 94   Temp 98.2 F (36.8 C)   Resp 19   Ht 5\' 5"  (1.651 m)   Wt 70.3 kg   SpO2 100%   BMI 25.79  kg/m  Physical Exam Vitals and nursing note reviewed.  Constitutional:      Appearance: She is well-developed.  HENT:     Head: Normocephalic.  Cardiovascular:     Rate and Rhythm: Normal rate.  Pulmonary:     Effort: Pulmonary effort is normal.  Abdominal:     General: Abdomen is flat. There is no distension.  Musculoskeletal:        General: Normal range of motion.     Cervical back: Normal range of motion.  Skin:    General: Skin is warm.  Neurological:     General: No focal deficit present.     Mental Status: She is alert and oriented to person, place, and time.  Psychiatric:        Mood and Affect: Mood normal.     ED Results / Procedures / Treatments    Labs (all labs ordered are listed, but only abnormal results are displayed) Labs Reviewed  LIPASE, BLOOD - Abnormal; Notable for the following components:      Result Value   Lipase <10 (*)    All other components within normal limits  COMPREHENSIVE METABOLIC PANEL - Abnormal; Notable for the following components:   Sodium 131 (*)    Potassium 3.2 (*)    Chloride 95 (*)    CO2 19 (*)    Glucose, Bld 146 (*)    BUN 7 (*)    Calcium 10.5 (*)    Total Protein 8.3 (*)    Albumin 5.1 (*)    Anion gap 17 (*)    All other components within normal limits  CBC - Abnormal; Notable for the following components:   WBC 12.6 (*)    RBC 5.47 (*)    MCV 79.5 (*)    Platelets 419 (*)    All other components within normal limits    EKG None  Radiology No results found.  Procedures Procedures    Medications Ordered in ED Medications  ondansetron (ZOFRAN-ODT) disintegrating tablet 4 mg (4 mg Oral Given 08/28/22 1401)  sodium chloride 0.9 % bolus 1,000 mL (0 mLs Intravenous Stopped 08/28/22 1903)  metoCLOPramide (REGLAN) injection 10 mg (10 mg Intravenous Given 08/28/22 1519)  diphenhydrAMINE (BENADRYL) injection 25 mg (25 mg Intravenous Given 08/28/22 1519)  HYDROmorphone (DILAUDID) injection 1 mg (1 mg Intravenous Given 08/28/22 1519)  LORazepam (ATIVAN) injection 1 mg (1 mg Intravenous Given 08/28/22 2001)  potassium chloride SA (KLOR-CON M) CR tablet 40 mEq (40 mEq Oral Given 08/28/22 2046)    ED Course/ Medical Decision Making/ A&P                           Medical Decision Making Patient complains of vomiting she is requesting medications for vomiting and pain  Amount and/or Complexity of Data Reviewed External Data Reviewed: notes.    Details: Primary care notes reviewed Labs: ordered. Decision-making details documented in ED Course.    Details: Labs ordered reviewed and interpreted patient's potassium is low at 3.2 and a slightly elevated white blood cell count of  12.6  Risk Prescription drug management. Risk Details: Given IV fluids x1 L patient is given Dilaudid 1 mg Reglan 10 mg and Benadryl 25 mg patient reports improved pain she continues to have some nausea I gave the patient 1 mg of Ativan for her symptoms.  Patient will be given potassium 40 mEq.  Patient is out of nausea medicine at home patient is given  a prescription for Zofran she states that she normally takes this without any complications.  She is advised to cease use of marijuana THC products as this can exacerbate her cyclical vomiting.           Final Clinical Impression(s) / ED Diagnoses Final diagnoses:  Cyclical vomiting syndrome not associated with migraine    Rx / DC Orders ED Discharge Orders          Ordered    ondansetron (ZOFRAN-ODT) 4 MG disintegrating tablet  Every 8 hours PRN        08/28/22 2051           An After Visit Summary was printed and given to the patient.    Elson Areas, Cordelia Poche 08/28/22 2059    Derwood Kaplan, MD 09/05/22 1558

## 2022-08-28 NOTE — ED Notes (Signed)
Pt had 2 emesis occurences and is requesting nausea medicine.

## 2022-08-28 NOTE — ED Triage Notes (Signed)
Patient here POV from Home.  Endorses Nausea and Emesis for approximately 4-5 Hours.   No Fevers. No Diarrhea. History of Cyclical Vomiting.  Tearful and Vomiting in Triage. A&Ox4. GCS 15. BIB Wheelchair.

## 2022-08-30 ENCOUNTER — Emergency Department (HOSPITAL_COMMUNITY)
Admission: EM | Admit: 2022-08-30 | Discharge: 2022-08-30 | Disposition: A | Discharge status: Home / Self Care | Payer: BC Managed Care – PPO | Attending: Emergency Medicine | Admitting: Emergency Medicine

## 2022-08-30 ENCOUNTER — Encounter (HOSPITAL_COMMUNITY): Payer: Self-pay

## 2022-08-30 ENCOUNTER — Other Ambulatory Visit: Payer: Self-pay

## 2022-08-30 DIAGNOSIS — I10 Essential (primary) hypertension: Secondary | ICD-10-CM | POA: Insufficient documentation

## 2022-08-30 DIAGNOSIS — Z79899 Other long term (current) drug therapy: Secondary | ICD-10-CM | POA: Diagnosis not present

## 2022-08-30 DIAGNOSIS — N9489 Other specified conditions associated with female genital organs and menstrual cycle: Secondary | ICD-10-CM | POA: Diagnosis not present

## 2022-08-30 DIAGNOSIS — E871 Hypo-osmolality and hyponatremia: Secondary | ICD-10-CM | POA: Diagnosis not present

## 2022-08-30 DIAGNOSIS — E876 Hypokalemia: Secondary | ICD-10-CM | POA: Diagnosis not present

## 2022-08-30 DIAGNOSIS — R1115 Cyclical vomiting syndrome unrelated to migraine: Secondary | ICD-10-CM

## 2022-08-30 LAB — URINALYSIS, ROUTINE W REFLEX MICROSCOPIC
Bilirubin Urine: NEGATIVE
Glucose, UA: NEGATIVE mg/dL
Hgb urine dipstick: NEGATIVE
Ketones, ur: 80 mg/dL — AB
Leukocytes,Ua: NEGATIVE
Nitrite: NEGATIVE
Protein, ur: 100 mg/dL — AB
Specific Gravity, Urine: 1.012 (ref 1.005–1.030)
pH: 7 (ref 5.0–8.0)

## 2022-08-30 LAB — COMPREHENSIVE METABOLIC PANEL
ALT: 21 U/L (ref 0–44)
AST: 35 U/L (ref 15–41)
Albumin: 4.7 g/dL (ref 3.5–5.0)
Alkaline Phosphatase: 68 U/L (ref 38–126)
Anion gap: 16 — ABNORMAL HIGH (ref 5–15)
BUN: 14 mg/dL (ref 8–23)
CO2: 16 mmol/L — ABNORMAL LOW (ref 22–32)
Calcium: 9.6 mg/dL (ref 8.9–10.3)
Chloride: 94 mmol/L — ABNORMAL LOW (ref 98–111)
Creatinine, Ser: 0.86 mg/dL (ref 0.44–1.00)
GFR, Estimated: >60 mL/min (ref >60)
Glucose, Bld: 142 mg/dL — ABNORMAL HIGH (ref 70–99)
Potassium: 2.7 mmol/L — CL (ref 3.5–5.1)
Sodium: 126 mmol/L — ABNORMAL LOW (ref 135–145)
Total Bilirubin: 1.3 mg/dL — ABNORMAL HIGH (ref 0.3–1.2)
Total Protein: 7.5 g/dL (ref 6.5–8.1)

## 2022-08-30 LAB — CBC WITH DIFFERENTIAL/PLATELET
Abs Immature Granulocytes: 0.09 10*3/uL — ABNORMAL HIGH (ref 0.00–0.07)
Basophils Absolute: 0.1 10*3/uL (ref 0.0–0.1)
Basophils Relative: 0 %
Eosinophils Absolute: 0 10*3/uL (ref 0.0–0.5)
Eosinophils Relative: 0 %
HCT: 42.5 % (ref 36.0–46.0)
Hemoglobin: 15.3 g/dL — ABNORMAL HIGH (ref 12.0–15.0)
Immature Granulocytes: 1 %
Lymphocytes Relative: 17 %
Lymphs Abs: 3 10*3/uL (ref 0.7–4.0)
MCH: 27.8 pg (ref 26.0–34.0)
MCHC: 36 g/dL (ref 30.0–36.0)
MCV: 77.3 fL — ABNORMAL LOW (ref 80.0–100.0)
Monocytes Absolute: 1.3 10*3/uL — ABNORMAL HIGH (ref 0.1–1.0)
Monocytes Relative: 8 %
Neutro Abs: 12.8 10*3/uL — ABNORMAL HIGH (ref 1.7–7.7)
Neutrophils Relative %: 74 %
Platelets: 444 10*3/uL — ABNORMAL HIGH (ref 150–400)
RBC: 5.5 MIL/uL — ABNORMAL HIGH (ref 3.87–5.11)
RDW: 14.3 % (ref 11.5–15.5)
WBC: 17.3 10*3/uL — ABNORMAL HIGH (ref 4.0–10.5)
nRBC: 0 % (ref 0.0–0.2)

## 2022-08-30 LAB — I-STAT BETA HCG BLOOD, ED (MC, WL, AP ONLY): I-stat hCG, quantitative: <5 m[IU]/mL (ref <5)

## 2022-08-30 LAB — BASIC METABOLIC PANEL
Anion gap: 9 (ref 5–15)
BUN: 12 mg/dL (ref 8–23)
CO2: 21 mmol/L — ABNORMAL LOW (ref 22–32)
Calcium: 8.4 mg/dL — ABNORMAL LOW (ref 8.9–10.3)
Chloride: 98 mmol/L (ref 98–111)
Creatinine, Ser: 0.59 mg/dL (ref 0.44–1.00)
GFR, Estimated: >60 mL/min (ref >60)
Glucose, Bld: 102 mg/dL — ABNORMAL HIGH (ref 70–99)
Potassium: 3.1 mmol/L — ABNORMAL LOW (ref 3.5–5.1)
Sodium: 128 mmol/L — ABNORMAL LOW (ref 135–145)

## 2022-08-30 LAB — BLOOD GAS, VENOUS
Acid-Base Excess: 1.5 mmol/L (ref 0.0–2.0)
Bicarbonate: 22 mmol/L (ref 20.0–28.0)
O2 Saturation: 77.4 %
Patient temperature: 37
pCO2, Ven: 24 mmHg — ABNORMAL LOW (ref 44–60)
pH, Ven: 7.57 — ABNORMAL HIGH (ref 7.25–7.43)
pO2, Ven: 47 mmHg — ABNORMAL HIGH (ref 32–45)

## 2022-08-30 LAB — MAGNESIUM: Magnesium: 1.7 mg/dL (ref 1.7–2.4)

## 2022-08-30 LAB — LIPASE, BLOOD: Lipase: 27 U/L (ref 11–51)

## 2022-08-30 MED ORDER — POTASSIUM CHLORIDE CRYS ER 20 MEQ PO TBCR
40.0000 meq | EXTENDED_RELEASE_TABLET | Freq: Once | ORAL | Status: DC
  Filled 2022-08-30: qty 2

## 2022-08-30 MED ORDER — ZIKS ARTHRITIS PAIN RELIEF 0.025-1-12 % EX CREA: 1.0000 | TOPICAL_CREAM | Freq: Two times a day (BID) | CUTANEOUS | 0 refills | Status: DC | PRN

## 2022-08-30 MED ORDER — HYDROMORPHONE HCL 1 MG/ML IJ SOLN
1.0000 mg | Freq: Once | INTRAMUSCULAR | Status: AC
  Administered 2022-08-30: 1 mg via INTRAVENOUS
  Filled 2022-08-30: qty 1

## 2022-08-30 MED ORDER — POTASSIUM CHLORIDE 10 MEQ/100ML IV SOLN
10.0000 meq | INTRAVENOUS | Status: AC
  Administered 2022-08-30 (×3): 10 meq via INTRAVENOUS
  Filled 2022-08-30 (×3): qty 100

## 2022-08-30 MED ORDER — LACTATED RINGERS IV BOLUS
1000.0000 mL | Freq: Once | INTRAVENOUS | Status: AC
  Administered 2022-08-30: 1000 mL via INTRAVENOUS

## 2022-08-30 MED ORDER — MAGNESIUM SULFATE 2 GM/50ML IV SOLN
2.0000 g | Freq: Once | INTRAVENOUS | Status: AC
  Administered 2022-08-30: 2 g via INTRAVENOUS
  Filled 2022-08-30: qty 50

## 2022-08-30 MED ORDER — METOCLOPRAMIDE HCL 5 MG PO TABS: 5.0000 mg | ORAL_TABLET | Freq: Four times a day (QID) | ORAL | 0 refills | Status: DC | PRN

## 2022-08-30 MED ORDER — POTASSIUM CHLORIDE CRYS ER 20 MEQ PO TBCR: 20.0000 meq | EXTENDED_RELEASE_TABLET | Freq: Every day | ORAL | 0 refills | Status: DC

## 2022-08-30 MED ORDER — ONDANSETRON HCL 4 MG/2ML IJ SOLN
4.0000 mg | Freq: Once | INTRAMUSCULAR | Status: AC
Start: 2022-08-30 — End: 2022-08-30
  Administered 2022-08-30: 4 mg via INTRAVENOUS
  Filled 2022-08-30: qty 2

## 2022-08-30 NOTE — ED Triage Notes (Signed)
Pt reports cyclical vomiting, seen at [facility] yesterday for the same, reports dry heaves and vomiting continue.

## 2022-08-30 NOTE — ED Notes (Signed)
Date and time results received: 08/30/22 5:00 AM  (use smartphrase ".now" to insert current time)  Test: Potassium Critical Value: 2.7  Name of Provider Notified: Roxanne Mins  Orders Received? Or Actions Taken?: Orders Received - See Orders for details

## 2022-08-30 NOTE — ED Provider Notes (Signed)
  Provider Note MRN:  [id number]  Arrival date & time: 08/30/22    ED Course and Medical Decision Making  Assumed care from dr Roxanne Mins at shift change.  See note from prior team for complete details, in brief:  61 yo female Hx cyclical vomiting Today her for emesis Seen at [facility] recently as well for same Hypok > replacement in progress Feeling better Repeat bmp/po challenge for dispo  Plan per prior physician recheck labs, p.o. challenge  BMP was repeated, potassium is improved.  She is able tolerate p.o. intake without difficulty.  Patient stable for discharge at this time per plan of prior team.  We will continue oral potassium at home.  Follow-up with your PCP in the next 3 days for repeat potassium check.  She is agreeable to plan.  Advised patient refrain from [facility] use in the future. Encouraged rehydration at home.   The patient improved significantly and was discharged in stable condition. Detailed discussions were had with the patient regarding current findings, and need for close f/u with PCP or on call doctor. The patient has been instructed to return immediately if the symptoms worsen in any way for re-evaluation. Patient verbalized understanding and is in agreement with current care plan. All questions answered prior to discharge.      Procedures  Final Clinical Impressions(s) / ED Diagnoses     ICD-10-CM   1. Non-intractable cyclical vomiting with nausea  R11.15     2. Hyponatremia  E87.1     3. Hypokalemia  E87.6       ED Discharge Orders          Ordered    Capsaicin-Menthol-Methyl Sal (CAPSAICIN-METHYL SAL-MENTHOL) 0.025-1-12 % CREA  2 times daily PRN        08/30/22 1128    metoCLOPramide (REGLAN) 5 MG tablet  Every 6 hours PRN        08/30/22 1128    potassium chloride SA (KLOR-CON M) 20 MEQ tablet  Daily        08/30/22 1131              Discharge Instructions      Please follow-up with your PCP for repeat labs in the next 3 to 5 days  Consider  cessation of marijuana use   It was a pleasure caring for you today in the emergency department.  Please return to the emergency department for any worsening or worrisome symptoms.          Jeanell Sparrow, DO 08/30/22 1606

## 2022-08-30 NOTE — ED Provider Notes (Signed)
[facility]-EMERGENCY DEPT Provider Note   CSN: [id number] Arrival date & time: 08/30/22  0301     History {Add pertinent medical, surgical, social history, OB history to HPI:1} Chief Complaint  Patient presents with   Emesis    Katie Chang is a 61 y.o. female.  The history is provided by the patient.  Emesis She has history of hypertension, cyclic vomiting syndrome and comes in with ongoing upper abdominal pain and vomiting.  She started vomiting yesterday and went to another emergency department and was feeling somewhat better on discharge, but she started vomiting soon after getting home.  This is typical of her cyclic vomiting syndrome exacerbations.  She denies fever but does endorse chills and sweats.  She denies any diarrhea.  There has been no blood in the emesis.   Home Medications Prior to Admission medications   Medication Sig Start Date End Date Taking? Authorizing Provider  albuterol (PROAIR HFA) 108 (90 BASE) MCG/ACT inhaler Inhale 2 puffs into the lungs every 6 (six) hours as needed for wheezing. 03/11/13   Sondra Barges, PA-C  amLODipine (NORVASC) 5 MG tablet Take 5 mg by mouth daily. 03/29/22   [provider]  celecoxib (CELEBREX) 200 MG capsule Take 200 mg by mouth 3 (three) times daily. 05/23/22   [provider]  cholecalciferol (VITAMIN D3) 25 MCG (1000 UT) tablet Take 2,000 Units by mouth daily.    [provider]  esomeprazole (NEXIUM) 40 MG capsule Take 40 mg by mouth daily. 03/04/18   [provider]  fluticasone (FLONASE) 50 MCG/ACT nasal spray Place 2 sprays into both nostrils daily.    [provider]  levocetirizine (XYZAL) 5 MG tablet Take 5 mg by mouth every evening.    [provider]  LORazepam (ATIVAN) 1 MG tablet Take 1 mg by mouth at bedtime. 04/09/18   [provider]  magnesium hydroxide (MILK OF MAGNESIA) 400 MG/5ML suspension Take 30 mLs by mouth daily as needed for  mild constipation.    [provider]  metoprolol succinate (TOPROL-XL) 25 MG 24 hr tablet Take 25 mg by mouth daily. 04/12/22   [provider]  montelukast (SINGULAIR) 10 MG tablet TAKE ONE TABLET AT BEDTIME. 04/22/14   Ethelda Chick, MD  ondansetron (ZOFRAN) 4 MG tablet Take 4 mg by mouth daily as needed for nausea or vomiting.  02/19/18   [provider]  ondansetron (ZOFRAN-ODT) 4 MG disintegrating tablet Take 1 tablet (4 mg total) by mouth every 8 (eight) hours as needed for nausea or vomiting. 08/28/22   Elson Areas, PA-C  rosuvastatin (CRESTOR) 10 MG tablet Take by mouth. 03/29/22   [provider]  valsartan (DIOVAN) 80 MG tablet Take 80 mg by mouth at bedtime. 03/07/22   [provider]  venlafaxine XR (EFFEXOR-XR) 37.5 MG 24 hr capsule Take 112.5 mg by mouth daily. 06/17/17   [provider]      Allergies    Avelox [moxifloxacin hcl in nacl] and Haldol [haloperidol lactate]    Review of Systems   Review of Systems  Gastrointestinal:  Positive for vomiting.  All other systems reviewed and are negative.   Physical Exam Updated Vital Signs BP (!) 122/96 (BP Location: Left Arm)   Pulse (!) 145   Temp 98 F (36.7 C) (Oral)   Resp 19   Ht 5\' 5"  (1.651 m)   Wt 69.9 kg   SpO2 100%   BMI 25.63 kg/m  Physical  Exam Vitals and nursing note reviewed.   61 year old female, appears uncomfortable, hyperventilating. Vital signs are significant for rapid heart rate. Oxygen saturation is 100%, which is normal. Head is normocephalic and atraumatic. PERRLA, EOMI. Oropharynx is clear. Neck is nontender and supple without adenopathy or JVD. Back is nontender and there is no CVA tenderness. Lungs are clear without rales, wheezes, or rhonchi. Chest is nontender. Heart has regular rate and rhythm without murmur. Abdomen is soft, flat, with moderate epigastric tenderness.  There is no rebound or guarding.  Peristalsis is  hypoactive. Extremities have no cyanosis or edema, full range of motion is present. Skin is warm and dry without rash. Neurologic: Mental status is normal, cranial nerves are intact, moves all extremities equally.  ED Results / Procedures / Treatments   Labs (all labs ordered are listed, but only abnormal results are displayed) Labs Reviewed  CBC WITH DIFFERENTIAL/PLATELET - Abnormal; Notable for the following components:      Result Value   WBC 17.3 (*)    RBC 5.50 (*)    Hemoglobin 15.3 (*)    MCV 77.3 (*)    Platelets 444 (*)    Neutro Abs 12.8 (*)    Monocytes Absolute 1.3 (*)    Abs Immature Granulocytes 0.09 (*)    All other components within normal limits  COMPREHENSIVE METABOLIC PANEL - Abnormal; Notable for the following components:   Sodium 126 (*)    Potassium 2.7 (*)    Chloride 94 (*)    CO2 16 (*)    Glucose, Bld 142 (*)    Total Bilirubin 1.3 (*)    Anion gap 16 (*)    All other components within normal limits  URINALYSIS, ROUTINE W REFLEX MICROSCOPIC - Abnormal; Notable for the following components:   Ketones, ur 80 (*)    Protein, ur 100 (*)    Bacteria, UA RARE (*)    All other components within normal limits  BLOOD GAS, VENOUS - Abnormal; Notable for the following components:   pH, Ven 7.57 (*)    pCO2, Ven 24 (*)    pO2, Ven 47 (*)    All other components within normal limits  LIPASE, BLOOD  MAGNESIUM  I-STAT BETA HCG BLOOD, ED ([facility], [facility], [facility] ONLY)   Procedures Procedures  Cardiac monitor shows sinus tachycardia, per my interpretation.  Medications Ordered in ED Medications  potassium chloride 10 mEq in 100 mL IVPB (has no administration in time range)  lactated ringers bolus 1,000 mL (has no administration in time range)  potassium chloride SA (KLOR-CON M) CR tablet 40 mEq (has no administration in time range)  magnesium sulfate IVPB 2 g 50 mL (has no administration in time range)  lactated ringers bolus 1,000 mL (1,000 mLs Intravenous New  Bag/Given 08/30/22 0355)  HYDROmorphone (DILAUDID) injection 1 mg (1 mg Intravenous Given 08/30/22 0357)  ondansetron (ZOFRAN) injection 4 mg (4 mg Intravenous Given 08/30/22 0357)    ED Course/ Medical Decision Making/ A&P                           Medical Decision Making Amount and/or Complexity of Data Reviewed Labs: ordered.  Risk Prescription drug management.   Ongoing epigastric pain and vomiting consistent with exacerbation of cyclic vomiting syndrome.  Differential diagnosis does include small bowel obstruction, viral gastritis, cannabis hyperemesis syndrome, ketoacidosis.  Because of her degree of hyperventilation, I have ordered a venous blood gas in addition  to screening labs of CBC, comprehensive metabolic panel, lipase, urinalysis.  I have ordered hydromorphone for pain, ondansetron for nausea, and lactated Ringer's solution.  I have reviewed her past records, and she was hospitalized on 05/30/2022 and also on 7/91/5056 for cyclical vomiting syndrome.  She was seen in the emergency department on 97/94/8016 for cyclical vomiting syndrome, treated with metoclopramide, ondansetron, hydromorphone, lorazepam.  Following above-noted treatment, patient stated that she was feeling much better.  Heart rate has come down and is now normal.  I have reviewed and interpreted her laboratory tests and my interpretation is moderate leukocytosis, which is nonspecific.  Thrombocytosis not significantly changed from recent value.  Hyponatremia and hypokalemia as well as metabolic acidosis and borderline elevated total bilirubin.  Electrolyte disturbances slightly worse than 2 days ago although anion gap has decreased.  Venous blood gas shows respiratory alkalosis.  I have ordered additional IV fluids, oral and intravenous potassium.  Magnesium is normal, but at the lower end of normal.  I have also ordered some intravenous magnesium.  Final Clinical Impression(s) / ED Diagnoses Final diagnoses:  None     Rx / DC Orders ED Discharge Orders     None

## 2022-08-30 NOTE — Discharge Instructions (Addendum)
Please follow-up with your PCP for repeat labs in the next 3 to 5 days  Consider cessation of marijuana use   It was a pleasure caring for you today in the emergency department.  Please return to the emergency department for any worsening or worrisome symptoms.

## 2022-09-13 DIAGNOSIS — R509 Fever, unspecified: Secondary | ICD-10-CM | POA: Diagnosis not present

## 2022-09-13 DIAGNOSIS — R52 Pain, unspecified: Secondary | ICD-10-CM | POA: Diagnosis not present

## 2022-09-13 DIAGNOSIS — U071 COVID-19: Secondary | ICD-10-CM | POA: Diagnosis not present

## 2022-11-05 ENCOUNTER — Telehealth: Payer: Self-pay

## 2022-11-05 NOTE — Patient Outreach (Signed)
  Care Coordination   11/05/2022 Name: Katie Chang MRN: [id number] DOB: 01/20/1961   Care Coordination Outreach Attempts:  An unsuccessful telephone outreach was attempted today to offer the patient information about available care coordination services as a benefit of their health plan.   Follow Up Plan:  Additional outreach attempts will be made to offer the patient care coordination information and services.   Encounter Outcome:  No Answer   Care Coordination Interventions:  No, not indicated    Dudley Major RN, BSN,CCM, CDE Care Management Coordinator [facility] Care Management [phone]

## 2022-11-13 ENCOUNTER — Telehealth: Payer: Self-pay

## 2022-11-13 NOTE — Patient Outreach (Signed)
  Care Coordination   11/13/2022 Name: Katie Chang MRN: [id number] DOB: May 04, 1961   Care Coordination Outreach Attempts:  A second unsuccessful outreach was attempted today to offer the patient with information about available care coordination services as a benefit of their health plan.     Follow Up Plan:  Additional outreach attempts will be made to offer the patient care coordination information and services.   Encounter Outcome:  No Answer   Care Coordination Interventions:  No, not indicated    Dudley Major RN, BSN,CCM, CDE Care Management Coordinator [facility] Care Management [phone]

## 2022-12-13 ENCOUNTER — Other Ambulatory Visit: Payer: Self-pay

## 2022-12-13 ENCOUNTER — Emergency Department (HOSPITAL_COMMUNITY)
Admission: EM | Admit: 2022-12-13 | Discharge: 2022-12-13 | Disposition: A | Discharge status: Home / Self Care | Payer: BC Managed Care – PPO | Source: Home / Self Care | Attending: Emergency Medicine | Admitting: Emergency Medicine

## 2022-12-13 DIAGNOSIS — Z716 Tobacco abuse counseling: Secondary | ICD-10-CM | POA: Diagnosis not present

## 2022-12-13 DIAGNOSIS — F32A Depression, unspecified: Secondary | ICD-10-CM | POA: Diagnosis not present

## 2022-12-13 DIAGNOSIS — E876 Hypokalemia: Secondary | ICD-10-CM | POA: Insufficient documentation

## 2022-12-13 DIAGNOSIS — E871 Hypo-osmolality and hyponatremia: Secondary | ICD-10-CM | POA: Diagnosis not present

## 2022-12-13 DIAGNOSIS — J45909 Unspecified asthma, uncomplicated: Secondary | ICD-10-CM | POA: Insufficient documentation

## 2022-12-13 DIAGNOSIS — R064 Hyperventilation: Secondary | ICD-10-CM | POA: Diagnosis not present

## 2022-12-13 DIAGNOSIS — K219 Gastro-esophageal reflux disease without esophagitis: Secondary | ICD-10-CM | POA: Diagnosis not present

## 2022-12-13 DIAGNOSIS — Z87410 Personal history of cervical dysplasia: Secondary | ICD-10-CM | POA: Diagnosis not present

## 2022-12-13 DIAGNOSIS — I10 Essential (primary) hypertension: Secondary | ICD-10-CM | POA: Insufficient documentation

## 2022-12-13 DIAGNOSIS — Z7951 Long term (current) use of inhaled steroids: Secondary | ICD-10-CM | POA: Insufficient documentation

## 2022-12-13 DIAGNOSIS — Z7151 Drug abuse counseling and surveillance of drug abuser: Secondary | ICD-10-CM | POA: Diagnosis not present

## 2022-12-13 DIAGNOSIS — R1115 Cyclical vomiting syndrome unrelated to migraine: Secondary | ICD-10-CM | POA: Diagnosis not present

## 2022-12-13 DIAGNOSIS — Z888 Allergy status to other drugs, medicaments and biological substances status: Secondary | ICD-10-CM | POA: Diagnosis not present

## 2022-12-13 DIAGNOSIS — R1111 Vomiting without nausea: Secondary | ICD-10-CM | POA: Diagnosis not present

## 2022-12-13 DIAGNOSIS — Z79899 Other long term (current) drug therapy: Secondary | ICD-10-CM | POA: Insufficient documentation

## 2022-12-13 DIAGNOSIS — F17200 Nicotine dependence, unspecified, uncomplicated: Secondary | ICD-10-CM | POA: Insufficient documentation

## 2022-12-13 DIAGNOSIS — M1990 Unspecified osteoarthritis, unspecified site: Secondary | ICD-10-CM | POA: Diagnosis not present

## 2022-12-13 DIAGNOSIS — R1013 Epigastric pain: Secondary | ICD-10-CM | POA: Insufficient documentation

## 2022-12-13 DIAGNOSIS — F1210 Cannabis abuse, uncomplicated: Secondary | ICD-10-CM | POA: Diagnosis not present

## 2022-12-13 DIAGNOSIS — R9431 Abnormal electrocardiogram [ECG] [EKG]: Secondary | ICD-10-CM | POA: Diagnosis not present

## 2022-12-13 DIAGNOSIS — R112 Nausea with vomiting, unspecified: Secondary | ICD-10-CM | POA: Diagnosis not present

## 2022-12-13 DIAGNOSIS — F17210 Nicotine dependence, cigarettes, uncomplicated: Secondary | ICD-10-CM | POA: Diagnosis not present

## 2022-12-13 DIAGNOSIS — F419 Anxiety disorder, unspecified: Secondary | ICD-10-CM | POA: Diagnosis not present

## 2022-12-13 DIAGNOSIS — R000 Tachycardia, unspecified: Secondary | ICD-10-CM | POA: Diagnosis not present

## 2022-12-13 DIAGNOSIS — Z881 Allergy status to other antibiotic agents status: Secondary | ICD-10-CM | POA: Diagnosis not present

## 2022-12-13 DIAGNOSIS — Z8249 Family history of ischemic heart disease and other diseases of the circulatory system: Secondary | ICD-10-CM | POA: Diagnosis not present

## 2022-12-13 DIAGNOSIS — J302 Other seasonal allergic rhinitis: Secondary | ICD-10-CM | POA: Diagnosis not present

## 2022-12-13 LAB — LIPASE, BLOOD: Lipase: 25 U/L (ref 11–51)

## 2022-12-13 LAB — COMPREHENSIVE METABOLIC PANEL
ALT: 18 U/L (ref 0–44)
AST: 27 U/L (ref 15–41)
Albumin: 4.3 g/dL (ref 3.5–5.0)
Alkaline Phosphatase: 68 U/L (ref 38–126)
Anion gap: 13 (ref 5–15)
BUN: 12 mg/dL (ref 8–23)
CO2: 19 mmol/L — ABNORMAL LOW (ref 22–32)
Calcium: 9.6 mg/dL (ref 8.9–10.3)
Chloride: 104 mmol/L (ref 98–111)
Creatinine, Ser: 0.68 mg/dL (ref 0.44–1.00)
GFR, Estimated: >60 mL/min (ref >60)
Glucose, Bld: 146 mg/dL — ABNORMAL HIGH (ref 70–99)
Potassium: 3.2 mmol/L — ABNORMAL LOW (ref 3.5–5.1)
Sodium: 136 mmol/L (ref 135–145)
Total Bilirubin: 0.6 mg/dL (ref 0.3–1.2)
Total Protein: 7.3 g/dL (ref 6.5–8.1)

## 2022-12-13 LAB — URINALYSIS, ROUTINE W REFLEX MICROSCOPIC
Bilirubin Urine: NEGATIVE
Glucose, UA: NEGATIVE mg/dL
Hgb urine dipstick: NEGATIVE
Ketones, ur: 20 mg/dL — AB
Leukocytes,Ua: NEGATIVE
Nitrite: NEGATIVE
Protein, ur: NEGATIVE mg/dL
Specific Gravity, Urine: 1.01 (ref 1.005–1.030)
pH: 8 (ref 5.0–8.0)

## 2022-12-13 LAB — CBC
HCT: 39.3 % (ref 36.0–46.0)
Hemoglobin: 13.4 g/dL (ref 12.0–15.0)
MCH: 27.8 pg (ref 26.0–34.0)
MCHC: 34.1 g/dL (ref 30.0–36.0)
MCV: 81.5 fL (ref 80.0–100.0)
Platelets: 361 10*3/uL (ref 150–400)
RBC: 4.82 MIL/uL (ref 3.87–5.11)
RDW: 14.7 % (ref 11.5–15.5)
WBC: 10.1 10*3/uL (ref 4.0–10.5)
nRBC: 0 % (ref 0.0–0.2)

## 2022-12-13 LAB — RAPID URINE DRUG SCREEN, HOSP PERFORMED
Amphetamines: NOT DETECTED
Barbiturates: NOT DETECTED
Benzodiazepines: NOT DETECTED
Cocaine: NOT DETECTED
Opiates: POSITIVE — AB
Tetrahydrocannabinol: POSITIVE — AB

## 2022-12-13 MED ORDER — HYDROMORPHONE HCL 1 MG/ML IJ SOLN
1.0000 mg | Freq: Once | INTRAMUSCULAR | Status: AC
  Administered 2022-12-13: 1 mg via INTRAVENOUS
  Filled 2022-12-13: qty 1

## 2022-12-13 MED ORDER — SODIUM CHLORIDE 0.9 % IV BOLUS
1000.0000 mL | Freq: Once | INTRAVENOUS | Status: AC
  Administered 2022-12-13: 1000 mL via INTRAVENOUS

## 2022-12-13 MED ORDER — POTASSIUM CHLORIDE 10 MEQ/100ML IV SOLN
10.0000 meq | Freq: Once | INTRAVENOUS | Status: AC
  Administered 2022-12-13: 10 meq via INTRAVENOUS
  Filled 2022-12-13: qty 100

## 2022-12-13 MED ORDER — POTASSIUM CHLORIDE CRYS ER 20 MEQ PO TBCR: 20.0000 meq | EXTENDED_RELEASE_TABLET | Freq: Two times a day (BID) | ORAL | 0 refills | Status: DC

## 2022-12-13 MED ORDER — DIPHENHYDRAMINE HCL 50 MG/ML IJ SOLN
25.0000 mg | Freq: Once | INTRAMUSCULAR | Status: AC
  Administered 2022-12-13: 25 mg via INTRAVENOUS
  Filled 2022-12-13: qty 1

## 2022-12-13 MED ORDER — METOCLOPRAMIDE HCL 5 MG/ML IJ SOLN
10.0000 mg | Freq: Once | INTRAMUSCULAR | Status: AC
  Administered 2022-12-13: 10 mg via INTRAVENOUS
  Filled 2022-12-13: qty 2

## 2022-12-13 MED ORDER — SODIUM CHLORIDE 0.9 % IV SOLN: INTRAVENOUS | Status: DC

## 2022-12-13 MED ORDER — ONDANSETRON HCL 4 MG/2ML IJ SOLN
4.0000 mg | Freq: Once | INTRAMUSCULAR | Status: AC
  Administered 2022-12-13: 4 mg via INTRAVENOUS
  Filled 2022-12-13: qty 2

## 2022-12-13 NOTE — ED Provider Notes (Addendum)
EMERGENCY DEPARTMENT AT [facility] Provider Note   CSN: [id number] Arrival date & time: 12/13/22  1430     History  Chief Complaint  Patient presents with   Abdominal Pain   Emesis    Katie Chang is a 62 y.o. female.  Patient with onset severe abdominal pain epigastric area multiple episodes of vomiting starting at 9 this morning.  Patient has a history of cyclical vomiting syndrome and last seen in October with difficulties.  Patient states this is very similar to what is occurred in the past.  Patient's every day smoker of tobacco.  And patient does use marijuana.  Review of her visits in October very similar presentation with severe abdominal pain.  Past medical history significant for depression asthma reflux disease degenerative disc disease cervical cyclical vomiting syndrome hypertension.       Home Medications Prior to Admission medications   Medication Sig Start Date End Date Taking? Authorizing Provider  albuterol (PROAIR HFA) 108 (90 BASE) MCG/ACT inhaler Inhale 2 puffs into the lungs every 6 (six) hours as needed for wheezing. 03/11/13   Sondra Barges, PA-C  amLODipine (NORVASC) 5 MG tablet Take 5 mg by mouth daily. 03/29/22   [provider]  Capsaicin-Menthol-Methyl Sal (CAPSAICIN-METHYL SAL-MENTHOL) 0.025-1-12 % CREA Apply 1 Application topically 2 (two) times daily as needed. 08/30/22   Sloan Leiter, DO  celecoxib (CELEBREX) 200 MG capsule Take 200 mg by mouth daily. 05/23/22   [provider]  cholecalciferol (VITAMIN D3) 25 MCG (1000 UT) tablet Take 2,000 Units by mouth daily.    [provider]  esomeprazole (NEXIUM) 40 MG capsule Take 40 mg by mouth daily. 03/04/18   [provider]  fluticasone (FLONASE) 50 MCG/ACT nasal spray Place 2 sprays into both nostrils daily as needed for allergies.    [provider]  levocetirizine (XYZAL) 5 MG tablet Take 5 mg by mouth every evening.    [provider]  LORazepam (ATIVAN) 1 MG tablet Take 1 mg by mouth at bedtime. 04/09/18   [provider]  magnesium hydroxide (MILK OF MAGNESIA) 400 MG/5ML suspension Take 30 mLs by mouth daily as needed for mild constipation.    [provider]  metoCLOPramide (REGLAN) 5 MG tablet Take 1 tablet (5 mg total) by mouth every 6 (six) hours as needed for nausea. 08/30/22   Sloan Leiter, DO  metoprolol succinate (TOPROL-XL) 25 MG 24 hr tablet Take 25 mg by mouth daily. 04/12/22   [provider]  montelukast (SINGULAIR) 10 MG tablet TAKE ONE TABLET AT BEDTIME. 04/22/14   Ethelda Chick, MD  ondansetron (ZOFRAN-ODT) 4 MG disintegrating tablet Take 1 tablet (4 mg total) by mouth every 8 (eight) hours as needed for nausea or vomiting. 08/28/22   Elson Areas, PA-C  potassium chloride SA (KLOR-CON M) 20 MEQ tablet Take 1 tablet (20 mEq total) by mouth daily for 5 days. 08/30/22 09/04/22  Tanda Rockers A, DO  rosuvastatin (CRESTOR) 10 MG tablet Take 10 mg by mouth as directed. Take Mon-Fri Only. 03/29/22   [provider]  valsartan (DIOVAN) 80 MG tablet Take 80 mg by mouth at bedtime. 03/07/22   [provider]  venlafaxine (EFFEXOR) 37.5 MG tablet Take 112.5 mg by mouth daily. 07/31/22   [provider]      Allergies    Avelox [moxifloxacin hcl in nacl] and Haldol [haloperidol lactate]    Review of Systems   Review of  Systems  Constitutional:  Negative for chills and fever.  HENT:  Negative for ear pain and sore throat.   Eyes:  Negative for pain and visual disturbance.  Respiratory:  Negative for cough and shortness of breath.   Cardiovascular:  Negative for chest pain and palpitations.  Gastrointestinal:  Positive for abdominal pain, nausea and vomiting.  Genitourinary:  Negative for dysuria and hematuria.  Musculoskeletal:  Negative for arthralgias and back pain.  Skin:  Negative for color change and rash.  Neurological:  Negative for  seizures and syncope.  All other systems reviewed and are negative.   Physical Exam Updated Vital Signs BP (!) 170/91 (BP Location: Left Arm)   Pulse 87   Temp 98 F (36.7 C) (Oral)   Resp 20   SpO2 100%  Physical Exam Vitals and nursing note reviewed.  Constitutional:      General: She is not in acute distress.    Appearance: Normal appearance. She is well-developed.  HENT:     Head: Normocephalic and atraumatic.     Mouth/Throat:     Mouth: Mucous membranes are dry.  Eyes:     Extraocular Movements: Extraocular movements intact.     Conjunctiva/sclera: Conjunctivae normal.     Pupils: Pupils are equal, round, and reactive to light.  Cardiovascular:     Rate and Rhythm: Normal rate and regular rhythm.     Heart sounds: No murmur heard. Pulmonary:     Effort: Pulmonary effort is normal. No respiratory distress.     Breath sounds: Normal breath sounds.  Abdominal:     Palpations: Abdomen is soft.     Tenderness: There is abdominal tenderness.     Comments: Epigastric area  Musculoskeletal:        General: No swelling.     Cervical back: Normal range of motion and neck supple.  Skin:    General: Skin is warm and dry.     Capillary Refill: Capillary refill takes less than 2 seconds.  Neurological:     General: No focal deficit present.     Mental Status: She is alert and oriented to person, place, and time.  Psychiatric:        Mood and Affect: Mood normal.     ED Results / Procedures / Treatments   Labs (all labs ordered are listed, but only abnormal results are displayed) Labs Reviewed  COMPREHENSIVE METABOLIC PANEL - Abnormal; Notable for the following components:      Result Value   Potassium 3.2 (*)    CO2 19 (*)    Glucose, Bld 146 (*)    All other components within normal limits  LIPASE, BLOOD  CBC  URINALYSIS, ROUTINE W REFLEX MICROSCOPIC  RAPID URINE DRUG SCREEN, HOSP PERFORMED    EKG EKG Interpretation  Date/Time:  Friday December 13 2022  15:31:40 EST Ventricular Rate:  68 PR Interval:  171 QRS Duration: 102 QT Interval:  457 QTC Calculation: 487 R Axis:   68 Text Interpretation: Sinus rhythm Probable left ventricular hypertrophy Borderline prolonged QT interval Baseline wander in lead(s) V1 Confirmed by Fredia Sorrow [phone]) on 12/13/2022 6:23:48 PM  Radiology No results found.  Procedures Procedures    Medications Ordered in ED Medications  0.9 %  sodium chloride infusion (has no administration in time range)  sodium chloride 0.9 % bolus 1,000 mL (has no administration in time range)  HYDROmorphone (DILAUDID) injection 1 mg (has no administration in time range)  metoCLOPramide (REGLAN) injection 10 mg (has no  administration in time range)  diphenhydrAMINE (BENADRYL) injection 25 mg (has no administration in time range)  potassium chloride 10 mEq in 100 mL IVPB (has no administration in time range)    ED Course/ Medical Decision Making/ A&P                             Medical Decision Making Amount and/or Complexity of Data Reviewed Labs: ordered.  Risk Prescription drug management.   Patient with history of sickle vomiting syndrome.  Today's presentation seems to be identical to presentations in October.  Will give IV fluids.  Will treat with IV Reglan IV Benadryl IV Dilaudid.  This is seem to have helped her in the past.  Patient's labs here today complete metabolic panel potassium down a little bit at 3.2 will give some IV potassium.  Glucose 146 CO2 19 renal function normal LFTs normal lipase normal.  No leukocytosis on the CBC.  Hemoglobin 13.4.  EKG without any acute abnormalities.  As they have felt that her marijuana use was playing a significant role with these episodes.  Will reevaluate the abdomen after she has had some treatment if there is any concerns for an acute abdominal process will get CT scan of the abdomen pelvis.  CRITICAL CARE Performed by: Fredia Sorrow Total critical care  time: 35 minutes Critical care time was exclusive of separately billable procedures and treating other patients. Critical care was necessary to treat or prevent imminent or life-threatening deterioration. Critical care was time spent personally by me on the following activities: development of treatment plan with patient and/or surrogate as well as nursing, discussions with consultants, evaluation of patient's response to treatment, examination of patient, obtaining history from patient or surrogate, ordering and performing treatments and interventions, ordering and review of laboratory studies, ordering and review of radiographic studies, pulse oximetry and re-evaluation of patient's condition.  Patient responded very well to IV fluids to the Reglan and the Benadryl and the hydromorphone.  Patient with a little bit of pain coming back a little bit of nausea coming back.  Says that she uses Zofran at home so we will give IV Zofran and will give 1 more dose of the Dilaudid.  Patient did receive some IV potassium for her potassium of 3.2.  Will have patient take oral potassium at home.  Patient feels comfortable for discharge.  Rechecked her abdomen soft and nontender.  No concern for acute abdominal process.  Final Clinical Impression(s) / ED Diagnoses Final diagnoses:  Cyclical vomiting syndrome  Hypokalemia    Rx / DC Orders ED Discharge Orders     None         Fredia Sorrow, MD 12/13/22 1919    Fredia Sorrow, MD 12/13/22 2232

## 2022-12-13 NOTE — ED Triage Notes (Signed)
Patient has been vomiting since 9am today, now he has severe abdominal pain. She has nothing left to vomit up. Visitor is speaking for her and reports this is d/t stress.

## 2022-12-13 NOTE — Discharge Instructions (Signed)
Take the oral potassium as directed for the next 2 days.  Take your Zofran to help with the nausea vomiting.  Return for any recurrent vomiting that persist.  Return for any new or worse symptoms.

## 2022-12-13 NOTE — ED Provider Triage Note (Signed)
Emergency Medicine Provider Triage Evaluation Note  Katie Chang , a 62 y.o. female  was evaluated in triage.  Pt complains of abdominal pain.  Patient reports abdominal pain began this morning.  Patient states abdominal pain is generalized in nature.  Patient states she has thrown up 5 times.  Patient denies fevers, diarrhea, dysuria, chest pain or shortness of breath.  Patient here with friend who states that the patient sometimes gets like this when she is stressed.  Patient states that abdominal pain is nonfocal in nature.  Review of Systems  Positive:  Negative:   Physical Exam  BP (!) 170/91 (BP Location: Left Arm)   Pulse 87   Temp 98 F (36.7 C) (Oral)   Resp 20   SpO2 100%  Gen:   Awake, no distress   Resp:  Normal effort  MSK:   Moves extremities without difficulty  Other:    Medical Decision Making  Medically screening exam initiated at 3:12 PM.  Appropriate orders placed.  Katie Chang was informed that the remainder of the evaluation will be completed by another provider, this initial triage assessment does not replace that evaluation, and the importance of remaining in the ED until their evaluation is complete.     Azucena Cecil, PA-C 12/13/22 1513

## 2022-12-15 ENCOUNTER — Encounter (HOSPITAL_COMMUNITY): Payer: Self-pay

## 2022-12-15 ENCOUNTER — Inpatient Hospital Stay (HOSPITAL_BASED_OUTPATIENT_CLINIC_OR_DEPARTMENT_OTHER)
Admission: EM | Admit: 2022-12-15 | Discharge: 2022-12-17 | DRG: 394 | Disposition: A | Discharge status: Home / Self Care | Payer: BC Managed Care – PPO | Attending: Internal Medicine | Admitting: Internal Medicine

## 2022-12-15 ENCOUNTER — Encounter (HOSPITAL_BASED_OUTPATIENT_CLINIC_OR_DEPARTMENT_OTHER): Payer: Self-pay | Admitting: Emergency Medicine

## 2022-12-15 ENCOUNTER — Other Ambulatory Visit: Payer: Self-pay

## 2022-12-15 DIAGNOSIS — R1115 Cyclical vomiting syndrome unrelated to migraine: Principal | ICD-10-CM | POA: Diagnosis present

## 2022-12-15 DIAGNOSIS — Z8249 Family history of ischemic heart disease and other diseases of the circulatory system: Secondary | ICD-10-CM | POA: Diagnosis not present

## 2022-12-15 DIAGNOSIS — F1290 Cannabis use, unspecified, uncomplicated: Secondary | ICD-10-CM | POA: Diagnosis present

## 2022-12-15 DIAGNOSIS — Z881 Allergy status to other antibiotic agents status: Secondary | ICD-10-CM

## 2022-12-15 DIAGNOSIS — E876 Hypokalemia: Secondary | ICD-10-CM | POA: Diagnosis present

## 2022-12-15 DIAGNOSIS — F1210 Cannabis abuse, uncomplicated: Secondary | ICD-10-CM | POA: Diagnosis present

## 2022-12-15 DIAGNOSIS — K219 Gastro-esophageal reflux disease without esophagitis: Secondary | ICD-10-CM | POA: Diagnosis present

## 2022-12-15 DIAGNOSIS — Z716 Tobacco abuse counseling: Secondary | ICD-10-CM

## 2022-12-15 DIAGNOSIS — M5126 Other intervertebral disc displacement, lumbar region: Secondary | ICD-10-CM | POA: Insufficient documentation

## 2022-12-15 DIAGNOSIS — M5020 Other cervical disc displacement, unspecified cervical region: Secondary | ICD-10-CM | POA: Insufficient documentation

## 2022-12-15 DIAGNOSIS — E871 Hypo-osmolality and hyponatremia: Secondary | ICD-10-CM | POA: Diagnosis present

## 2022-12-15 DIAGNOSIS — Z79899 Other long term (current) drug therapy: Secondary | ICD-10-CM | POA: Diagnosis not present

## 2022-12-15 DIAGNOSIS — I10 Essential (primary) hypertension: Secondary | ICD-10-CM | POA: Diagnosis present

## 2022-12-15 DIAGNOSIS — J302 Other seasonal allergic rhinitis: Secondary | ICD-10-CM | POA: Diagnosis not present

## 2022-12-15 DIAGNOSIS — Z888 Allergy status to other drugs, medicaments and biological substances status: Secondary | ICD-10-CM

## 2022-12-15 DIAGNOSIS — Z7151 Drug abuse counseling and surveillance of drug abuser: Secondary | ICD-10-CM

## 2022-12-15 DIAGNOSIS — Z87410 Personal history of cervical dysplasia: Secondary | ICD-10-CM | POA: Diagnosis not present

## 2022-12-15 DIAGNOSIS — F32A Depression, unspecified: Secondary | ICD-10-CM | POA: Diagnosis present

## 2022-12-15 DIAGNOSIS — F419 Anxiety disorder, unspecified: Secondary | ICD-10-CM | POA: Diagnosis not present

## 2022-12-15 DIAGNOSIS — F17210 Nicotine dependence, cigarettes, uncomplicated: Secondary | ICD-10-CM | POA: Diagnosis not present

## 2022-12-15 DIAGNOSIS — M1990 Unspecified osteoarthritis, unspecified site: Secondary | ICD-10-CM | POA: Diagnosis present

## 2022-12-15 DIAGNOSIS — R9431 Abnormal electrocardiogram [ECG] [EKG]: Secondary | ICD-10-CM | POA: Diagnosis present

## 2022-12-15 DIAGNOSIS — M47816 Spondylosis without myelopathy or radiculopathy, lumbar region: Secondary | ICD-10-CM | POA: Insufficient documentation

## 2022-12-15 LAB — COMPREHENSIVE METABOLIC PANEL
ALT: 22 U/L (ref 0–44)
AST: 38 U/L (ref 15–41)
Albumin: 4.9 g/dL (ref 3.5–5.0)
Alkaline Phosphatase: 65 U/L (ref 38–126)
Anion gap: 19 — ABNORMAL HIGH (ref 5–15)
BUN: 18 mg/dL (ref 8–23)
CO2: 18 mmol/L — ABNORMAL LOW (ref 22–32)
Calcium: 9.8 mg/dL (ref 8.9–10.3)
Chloride: 91 mmol/L — ABNORMAL LOW (ref 98–111)
Creatinine, Ser: 0.8 mg/dL (ref 0.44–1.00)
GFR, Estimated: >60 mL/min (ref >60)
Glucose, Bld: 133 mg/dL — ABNORMAL HIGH (ref 70–99)
Potassium: 2.4 mmol/L — CL (ref 3.5–5.1)
Sodium: 128 mmol/L — ABNORMAL LOW (ref 135–145)
Total Bilirubin: 1.1 mg/dL (ref 0.3–1.2)
Total Protein: 7.6 g/dL (ref 6.5–8.1)

## 2022-12-15 LAB — CBC WITH DIFFERENTIAL/PLATELET
Abs Immature Granulocytes: 0.11 10*3/uL — ABNORMAL HIGH (ref 0.00–0.07)
Basophils Absolute: 0 10*3/uL (ref 0.0–0.1)
Basophils Relative: 0 %
Eosinophils Absolute: 0.1 10*3/uL (ref 0.0–0.5)
Eosinophils Relative: 0 %
HCT: 34.5 % — ABNORMAL LOW (ref 36.0–46.0)
Hemoglobin: 12.6 g/dL (ref 12.0–15.0)
Immature Granulocytes: 1 %
Lymphocytes Relative: 17 %
Lymphs Abs: 2.6 10*3/uL (ref 0.7–4.0)
MCH: 28 pg (ref 26.0–34.0)
MCHC: 36.5 g/dL — ABNORMAL HIGH (ref 30.0–36.0)
MCV: 76.7 fL — ABNORMAL LOW (ref 80.0–100.0)
Monocytes Absolute: 1 10*3/uL (ref 0.1–1.0)
Monocytes Relative: 6 %
Neutro Abs: 11.4 10*3/uL — ABNORMAL HIGH (ref 1.7–7.7)
Neutrophils Relative %: 76 %
Platelets: 367 10*3/uL (ref 150–400)
RBC: 4.5 MIL/uL (ref 3.87–5.11)
RDW: 14.4 % (ref 11.5–15.5)
WBC: 14.7 10*3/uL — ABNORMAL HIGH (ref 4.0–10.5)
nRBC: 0 % (ref 0.0–0.2)

## 2022-12-15 LAB — MAGNESIUM
Magnesium: 2.5 mg/dL — ABNORMAL HIGH (ref 1.7–2.4)
Magnesium: 1.7 mg/dL (ref 1.7–2.4)

## 2022-12-15 LAB — BASIC METABOLIC PANEL
Anion gap: 11 (ref 5–15)
BUN: 13 mg/dL (ref 8–23)
CO2: 21 mmol/L — ABNORMAL LOW (ref 22–32)
Calcium: 8.1 mg/dL — ABNORMAL LOW (ref 8.9–10.3)
Chloride: 96 mmol/L — ABNORMAL LOW (ref 98–111)
Creatinine, Ser: 0.7 mg/dL (ref 0.44–1.00)
GFR, Estimated: >60 mL/min (ref >60)
Glucose, Bld: 142 mg/dL — ABNORMAL HIGH (ref 70–99)
Potassium: 2.7 mmol/L — CL (ref 3.5–5.1)
Sodium: 128 mmol/L — ABNORMAL LOW (ref 135–145)

## 2022-12-15 LAB — LIPASE, BLOOD: Lipase: <10 U/L — ABNORMAL LOW (ref 11–51)

## 2022-12-15 LAB — PHOSPHORUS
Phosphorus: 3.8 mg/dL (ref 2.5–4.6)
Phosphorus: 2.1 mg/dL — ABNORMAL LOW (ref 2.5–4.6)

## 2022-12-15 MED ORDER — POTASSIUM CHLORIDE IN NACL 20-0.9 MEQ/L-% IV SOLN: INTRAVENOUS | Status: AC

## 2022-12-15 MED ORDER — DROPERIDOL 2.5 MG/ML IJ SOLN
1.2500 mg | Freq: Once | INTRAMUSCULAR | Status: AC
  Administered 2022-12-15: 1.25 mg via INTRAVENOUS
  Filled 2022-12-15: qty 2

## 2022-12-15 MED ORDER — POTASSIUM CHLORIDE CRYS ER 20 MEQ PO TBCR: 40.0000 meq | EXTENDED_RELEASE_TABLET | Freq: Once | ORAL | Status: DC

## 2022-12-15 MED ORDER — PROCHLORPERAZINE EDISYLATE 10 MG/2ML IJ SOLN
5.0000 mg | INTRAMUSCULAR | Status: DC | PRN
  Administered 2022-12-15 – 2022-12-16 (×2): 5 mg via INTRAVENOUS
  Filled 2022-12-15 (×2): qty 2

## 2022-12-15 MED ORDER — ACETAMINOPHEN 650 MG RE SUPP: 650.0000 mg | Freq: Four times a day (QID) | RECTAL | Status: DC | PRN

## 2022-12-15 MED ORDER — NICOTINE 21 MG/24HR TD PT24: 21.0000 mg | MEDICATED_PATCH | Freq: Every day | TRANSDERMAL | Status: DC | PRN

## 2022-12-15 MED ORDER — METOPROLOL TARTRATE 5 MG/5ML IV SOLN: 2.5000 mg | Freq: Four times a day (QID) | INTRAVENOUS | Status: DC

## 2022-12-15 MED ORDER — POTASSIUM CHLORIDE IN NACL 20-0.9 MEQ/L-% IV SOLN: INTRAVENOUS | Status: DC

## 2022-12-15 MED ORDER — DIPHENHYDRAMINE HCL 50 MG/ML IJ SOLN
25.0000 mg | Freq: Once | INTRAMUSCULAR | Status: AC
  Administered 2022-12-15: 25 mg via INTRAVENOUS
  Filled 2022-12-15: qty 1

## 2022-12-15 MED ORDER — SODIUM CHLORIDE 0.9 % IV BOLUS
1000.0000 mL | Freq: Once | INTRAVENOUS | Status: AC
  Administered 2022-12-15: 1000 mL via INTRAVENOUS

## 2022-12-15 MED ORDER — POTASSIUM CHLORIDE 10 MEQ/100ML IV SOLN
10.0000 meq | INTRAVENOUS | Status: AC
  Administered 2022-12-15 (×4): 10 meq via INTRAVENOUS
  Filled 2022-12-15 (×4): qty 100

## 2022-12-15 MED ORDER — NICOTINE 21 MG/24HR TD PT24
21.0000 mg | MEDICATED_PATCH | Freq: Every day | TRANSDERMAL | Status: DC
  Administered 2022-12-15 – 2022-12-17 (×3): 21 mg via TRANSDERMAL
  Filled 2022-12-15 (×3): qty 1

## 2022-12-15 MED ORDER — HYDROMORPHONE HCL 1 MG/ML IJ SOLN
1.0000 mg | Freq: Once | INTRAMUSCULAR | Status: AC
  Administered 2022-12-15: 1 mg via INTRAVENOUS
  Filled 2022-12-15: qty 1

## 2022-12-15 MED ORDER — DIAZEPAM 5 MG/ML IJ SOLN
5.0000 mg | Freq: Four times a day (QID) | INTRAMUSCULAR | Status: DC | PRN
  Administered 2022-12-15: 5 mg via INTRAVENOUS

## 2022-12-15 MED ORDER — POTASSIUM CHLORIDE IN NACL 40-0.9 MEQ/L-% IV SOLN
INTRAVENOUS | Status: DC
  Filled 2022-12-15 (×5): qty 1000

## 2022-12-15 MED ORDER — POTASSIUM CHLORIDE IN NACL 20-0.9 MEQ/L-% IV SOLN
INTRAVENOUS | Status: AC
  Filled 2022-12-15: qty 1000

## 2022-12-15 MED ORDER — PANTOPRAZOLE SODIUM 40 MG IV SOLR
40.0000 mg | INTRAVENOUS | Status: DC
  Administered 2022-12-15: 40 mg via INTRAVENOUS
  Filled 2022-12-15: qty 10

## 2022-12-15 MED ORDER — PROCHLORPERAZINE EDISYLATE 10 MG/2ML IJ SOLN
5.0000 mg | Freq: Once | INTRAMUSCULAR | Status: AC
  Administered 2022-12-15: 5 mg via INTRAVENOUS
  Filled 2022-12-15: qty 2

## 2022-12-15 MED ORDER — PANTOPRAZOLE SODIUM 40 MG IV SOLR
40.0000 mg | Freq: Two times a day (BID) | INTRAVENOUS | Status: DC
  Administered 2022-12-16 – 2022-12-17 (×2): 40 mg via INTRAVENOUS
  Filled 2022-12-15 (×2): qty 10

## 2022-12-15 MED ORDER — MAGNESIUM SULFATE 2 GM/50ML IV SOLN
2.0000 g | Freq: Once | INTRAVENOUS | Status: AC
  Administered 2022-12-15: 2 g via INTRAVENOUS
  Filled 2022-12-15: qty 50

## 2022-12-15 MED ORDER — POTASSIUM CHLORIDE CRYS ER 20 MEQ PO TBCR
20.0000 meq | EXTENDED_RELEASE_TABLET | Freq: Once | ORAL | Status: AC
  Administered 2022-12-15: 20 meq via ORAL
  Filled 2022-12-15: qty 1

## 2022-12-15 MED ORDER — ACETAMINOPHEN 325 MG PO TABS
650.0000 mg | ORAL_TABLET | Freq: Four times a day (QID) | ORAL | Status: DC | PRN
  Administered 2022-12-15 – 2022-12-16 (×2): 650 mg via ORAL
  Filled 2022-12-15 (×2): qty 2

## 2022-12-15 NOTE — Progress Notes (Signed)
Plan of Care Note for accepted transfer   Patient: Katie Chang MRN: [id number]   [place]: 12/15/2022  Facility requesting transfer: [facility]. Requesting Provider: Deno Etienne, DO. Reason for transfer: Intractable nausea and vomiting with hyperkalemia and prolonged QTc on EKG. Facility course:  Per Dr. Tyrone Nine: " Katie Chang is a 62 y.o. female.   47 yoF with a chief complaints of abdominal pain nausea and vomiting.  Patient unfortunately has a history of cyclic vomiting syndrome.  Feels similar.  Was just seen a couple days ago.  Feels like her pain is not improved.  Unable to keep anything down at home.  Back to the ED for evaluation."  Lab work:  CBC with Differential [[id number]] (Abnormal)   Collected: 12/15/22 1153   Updated: 12/15/22 1232   Specimen Type: Blood   Specimen Source: Vein    WBC 14.7 High  K/uL   RBC 4.50 MIL/uL   Hemoglobin 12.6 g/dL   HCT 34.5 Low  %   MCV 76.7 Low  fL   MCH 28.0 pg   MCHC 36.5 High  g/dL   RDW 14.4 %   Platelets 367 K/uL   nRBC 0.0 %   Neutrophils Relative % 76 %   Neutro Abs 11.4 High  K/uL   Lymphocytes Relative 17 %   Lymphs Abs 2.6 K/uL   Monocytes Relative 6 %   Monocytes Absolute 1.0 K/uL   Eosinophils Relative 0 %   Eosinophils Absolute 0.1 K/uL   Basophils Relative 0 %   Basophils Absolute 0.0 K/uL   Immature Granulocytes 1 %   Abs Immature Granulocytes 0.11 High  K/uL  Comprehensive metabolic panel [[id number]] (Abnormal)   Collected: 12/15/22 1153   Updated: 12/15/22 1231   Specimen Type: Blood   Specimen Source: Vein    Sodium 128 Low  mmol/L   Potassium 2.4 Low Panic  mmol/L   Chloride 91 Low  mmol/L   CO2 18 Low  mmol/L   Glucose, Bld 133 High  mg/dL   BUN 18 mg/dL   Creatinine, Ser 0.80 mg/dL   Calcium 9.8 mg/dL   Total Protein 7.6 g/dL   Albumin 4.9 g/dL   AST 38 U/L   ALT 22 U/L   Alkaline Phosphatase 65 U/L   Total Bilirubin 1.1 mg/dL   GFR, Estimated >60 mL/min   Anion gap 19 High   Lipase, blood [[id number]]  (Abnormal)   Collected: 12/15/22 1153   Updated: 12/15/22 1231   Specimen Type: Blood   Specimen Source: Vein    Lipase <10 Low  U/L   Plan of care: The patient is accepted for admission to Telemetry unit, at [facility] to continue IV hydration and electrolyte replacement with close QTc monitoring with telemetry.  Author: Reubin Milan, MD 12/15/2022  Check [email or web address] for on-call coverage.  Nursing staff, Please call [place] number on Amion as soon as patient's arrival, so appropriate admitting provider can evaluate the pt.

## 2022-12-15 NOTE — ED Notes (Signed)
Kiana with [facility] called for transport  

## 2022-12-15 NOTE — Progress Notes (Signed)
Report recd from [place], Therapist, sports at [facility]. Pt will be leaving shortly via [facility]

## 2022-12-15 NOTE — H&P (Addendum)
History and Physical    Patient: Katie Chang XTK:[id number] DOB: [birth year] DOA: 12/15/2022 DOS: the patient was seen and examined on 12/15/2022 PCP: Josetta Huddle, MD  Patient coming from: Home  Chief Complaint: Nausea and vomiting.  HPI: Katie Chang is a 62 y.o. female with medical history significant of seasonal allergies, asthma, eczema, osteoarthritis, cervical dysplasia, cervical and lumbar DDD, GERD, hypertension, menopausal symptoms, migraine headaches, recurrent sinus infection, cyclic vomiting syndrome, cannabis use disorder who is coming to the emergency department with a 2-day history of numerous episodes of emesis with occasional bloody streaks.  No melena or hematochezia.  No diarrhea or constipation.  No fever, chills or night sweats. No sore throat, rhinorrhea, dyspnea, wheezing or hemoptysis.  No chest pain, palpitations, diaphoresis, PND, orthopnea or pitting edema of the lower extremities.  No flank pain, dysuria, frequency or hematuria.  No polyuria, polydipsia, polyphagia or blurred vision.  She has been very anxious due to multiple factors.  Lab work: CBC showed white count of 14.7, hemoglobin 12.6 g/dL platelets 367.  Lipase was less than 10.  CMP shows a sodium 128, potassium 2.4, chloride 91 and CO2 of 18 mmol/L with an anion gap of 19.  Glucose was 133 mg/dL.  Renal function, calcium and LFTs were normal.  ED course: Initial vital signs were temperature 97.9 F, pulse 120, respiration 28, BP 157/103 mmHg O2 sat 100% on room air.  The patient received diphenhydramine 25 mg IVP, droperidol 1.25 mg IVP, hydromorphone 1 mg IVP, magnesium sulfate 2 g IVPB and 1000 mL of normal saline bolus.   Review of Systems: As mentioned in the history of present illness. All other systems reviewed and are negative. Past Medical History:  Diagnosis Date   Allergy    Arthritis    Asthma    Cervical dysplasia    Cyclic vomiting syndrome    DDD (degenerative disc disease), cervical     Cervical and Lumbar   Depression    Eczema    GERD (gastroesophageal reflux disease)    Hypertension    Menopausal symptoms    Migraines    PONV (postoperative nausea and vomiting)    sometimes nausea   Recurrent sinus infections    Past Surgical History:  Procedure Laterality Date   CERVICAL BIOPSY     Dr. Cherylann Banas   COLONOSCOPY W/ POLYPECTOMY     COLPOSCOPY     ESOPHAGOGASTRODUODENOSCOPY (EGD) WITH PROPOFOL N/A 05/13/2016   Procedure: ESOPHAGOGASTRODUODENOSCOPY (EGD) WITH PROPOFOL;  Surgeon: Garlan Fair, MD;  Location: [facility];  Service: Endoscopy;  Laterality: N/A;   KNEE SURGERY     NASAL SINUS SURGERY  1992   NECK SURGERY     TONSILLECTOMY  1990   Social History:  reports that she has been smoking cigarettes. She has a 15.00 pack-year smoking history. She has never used smokeless tobacco. She reports current drug use. Drug: Marijuana. She reports that she does not drink alcohol.  Allergies  Allergen Reactions   Avelox [Moxifloxacin Hcl In Nacl] Hives    No allergic reaction to Levaquin.   Haldol [Haloperidol Lactate] Other (See Comments)    "Night terrors"    Family History  Problem Relation Age of Onset   Heart disease Mother    Hypertension Father    Diabetes Paternal Grandfather    Heart disease Paternal Grandfather    Stroke Maternal Grandmother    Stroke Maternal Grandfather    Breast cancer Maternal Aunt        Age 2's  Prior to Admission medications   Medication Sig Start Date End Date Taking? Authorizing Provider  albuterol (PROAIR HFA) 108 (90 BASE) MCG/ACT inhaler Inhale 2 puffs into the lungs every 6 (six) hours as needed for wheezing. 03/11/13   Rise Mu, PA-C  amLODipine (NORVASC) 5 MG tablet Take 5 mg by mouth daily. 03/29/22   [provider]  Capsaicin-Menthol-Methyl Sal (CAPSAICIN-METHYL SAL-MENTHOL) 0.025-1-12 % CREA Apply 1 Application topically 2 (two) times daily as needed. 08/30/22   Jeanell Sparrow, DO  celecoxib  (CELEBREX) 200 MG capsule Take 200 mg by mouth daily. 05/23/22   [provider]  cholecalciferol (VITAMIN D3) 25 MCG (1000 UT) tablet Take 2,000 Units by mouth daily.    [provider]  esomeprazole (NEXIUM) 40 MG capsule Take 40 mg by mouth daily. 03/04/18   [provider]  fluticasone (FLONASE) 50 MCG/ACT nasal spray Place 2 sprays into both nostrils daily as needed for allergies.    [provider]  levocetirizine (XYZAL) 5 MG tablet Take 5 mg by mouth every evening.    [provider]  LORazepam (ATIVAN) 1 MG tablet Take 1 mg by mouth at bedtime. 04/09/18   [provider]  magnesium hydroxide (MILK OF MAGNESIA) 400 MG/5ML suspension Take 30 mLs by mouth daily as needed for mild constipation.    [provider]  metoCLOPramide (REGLAN) 5 MG tablet Take 1 tablet (5 mg total) by mouth every 6 (six) hours as needed for nausea. 08/30/22   Jeanell Sparrow, DO  metoprolol succinate (TOPROL-XL) 25 MG 24 hr tablet Take 25 mg by mouth daily. 04/12/22   [provider]  montelukast (SINGULAIR) 10 MG tablet TAKE ONE TABLET AT BEDTIME. 04/22/14   Wardell Honour, MD  ondansetron (ZOFRAN-ODT) 4 MG disintegrating tablet Take 1 tablet (4 mg total) by mouth every 8 (eight) hours as needed for nausea or vomiting. 08/28/22   Fransico Meadow, PA-C  potassium chloride SA (KLOR-CON M) 20 MEQ tablet Take 1 tablet (20 mEq total) by mouth daily for 5 days. 08/30/22 09/04/22  Wynona Dove A, DO  potassium chloride SA (KLOR-CON M) 20 MEQ tablet Take 1 tablet (20 mEq total) by mouth 2 (two) times daily. 12/13/22   Fredia Sorrow, MD  rosuvastatin (CRESTOR) 10 MG tablet Take 10 mg by mouth as directed. Take Mon-Fri Only. 03/29/22   [provider]  valsartan (DIOVAN) 80 MG tablet Take 80 mg by mouth at bedtime. 03/07/22   [provider]  venlafaxine (EFFEXOR) 37.5 MG tablet Take 112.5 mg by mouth daily. 07/31/22   [provider]     Physical Exam: Vitals:   12/15/22 0956 12/15/22 1400 12/15/22 1549  BP: (!) 157/103 120/83 133/84  Pulse: (!) 120 97 88  Resp: (!) 28 13 18   Temp: 97.9 F (36.6 C) 99.4 F (37.4 C) 97.9 F (36.6 C)  TempSrc:  Oral Oral  SpO2: 100% 97% 100%  Weight:   73.1 kg  Height:   5\' 3"  (1.6 m)   Physical Exam Vitals and nursing note reviewed.  Constitutional:      General: She is awake. She is not in acute distress. HENT:     Head: Normocephalic.     Nose: No rhinorrhea.     Mouth/Throat:     Mouth: Mucous membranes are dry.  Eyes:     General: No scleral icterus.    Pupils: Pupils are equal, round, and reactive to light.  Neck:  Vascular: No JVD.  Cardiovascular:     Rate and Rhythm: Normal rate and regular rhythm.     Heart sounds: S1 normal and S2 normal.  Pulmonary:     Effort: Pulmonary effort is normal.     Breath sounds: Normal breath sounds. No wheezing, rhonchi or rales.  Abdominal:     General: Bowel sounds are normal. There is no distension.     Palpations: Abdomen is soft.     Tenderness: There is abdominal tenderness.  Musculoskeletal:     Cervical back: Neck supple.     Right lower leg: No edema.     Left lower leg: No edema.  Skin:    General: Skin is warm and dry.  Neurological:     General: No focal deficit present.     Mental Status: She is alert and oriented to person, place, and time.  Psychiatric:        Mood and Affect: Mood is anxious.        Behavior: Behavior normal. Behavior is cooperative.   Data Reviewed:  There are no new results to review at this time.  Assessment and Plan: Principal Problem:   Possible Cyclic vomiting syndrome Associated with:   Cannabis use disorder Observation/telemetry. Continue IV fluids. Keep n.p.o. for now. Analgesics as needed. Antiemetics as needed. Pantoprazole 40 mg IVP daily. Follow CBC, CMP and lipase in AM. Diazepam as needed for anxiety and emesis.  Active Problems:    Hypokalemia Correcting. Follow potassium level.    Hyponatremia Secondary to GI losses. Continue IV fluids. Follow-up sodium level.    Prolonged QT interval Avoid QT prolonging meds as possible. Continue KCl supplementation. Magnesium sulfate 2 g IVPB now. Keep electrolytes optimized. Check EKG in the morning.    Anxiety Advised to return to counseling. Advised to follow-up with behavioral health.    Tobacco use disorder Smoking cessation advised. NicoDerm 21 mg patch daily.      Advance Care Planning:   Code Status: Full Code   Consults: AM consult by [facility].  Family Communication:   Severity of Illness: The appropriate patient status for this patient is OBSERVATION. Observation status is judged to be reasonable and necessary in order to provide the required intensity of service to ensure the patient's safety. The patient's presenting symptoms, physical exam findings, and initial radiographic and laboratory data in the context of their medical condition is felt to place them at decreased risk for further clinical deterioration. Furthermore, it is anticipated that the patient will be medically stable for discharge from the hospital within 2 midnights of admission.   Author: Bobette Mo, MD 12/15/2022 4:03 PM  For on call review [email or web address].   This document was prepared using Dragon voice recognition software and may contain some unintended transcription errors.

## 2022-12-15 NOTE — ED Notes (Signed)
CRITICAL VALUE STICKER  CRITICAL VALUE:  RECEIVER (on-site recipient of call):Katie Chang  DATE & TIME NOTIFIED: 12/15/2022  now  MESSENGER (representative from lab):  MD NOTIFIED: dr Deno Etienne  TIME OF NOTIFICATION: nnow RESPONSE:

## 2022-12-15 NOTE — ED Notes (Signed)
Patient in and out of triage room asking for nausea medication and a bed.  Ambulating without difficulty.

## 2022-12-15 NOTE — ED Notes (Signed)
This RN provided report to [facility]. All questions and concerns addressed.

## 2022-12-15 NOTE — ED Provider Notes (Signed)
[place] EMERGENCY DEPARTMENT AT [facility] Provider Note   CSN: [id number] Arrival date & time: 12/15/22  0945     History  No chief complaint on file.   Katie Chang is a 62 y.o. female.  51 yoF with a chief complaints of abdominal pain nausea and vomiting.  Patient unfortunately has a history of cyclic vomiting syndrome.  Feels similar.  Was just seen a couple days ago.  Feels like her pain is not improved.  Unable to keep anything down at home.  Back to the ED for evaluation.        Home Medications Prior to Admission medications   Medication Sig Start Date End Date Taking? Authorizing Provider  albuterol (PROAIR HFA) 108 (90 BASE) MCG/ACT inhaler Inhale 2 puffs into the lungs every 6 (six) hours as needed for wheezing. 03/11/13   Sondra Barges, PA-C  amLODipine (NORVASC) 5 MG tablet Take 5 mg by mouth daily. 03/29/22   [provider]  Capsaicin-Menthol-Methyl Sal (CAPSAICIN-METHYL SAL-MENTHOL) 0.025-1-12 % CREA Apply 1 Application topically 2 (two) times daily as needed. 08/30/22   Sloan Leiter, DO  celecoxib (CELEBREX) 200 MG capsule Take 200 mg by mouth daily. 05/23/22   [provider]  cholecalciferol (VITAMIN D3) 25 MCG (1000 UT) tablet Take 2,000 Units by mouth daily.    [provider]  esomeprazole (NEXIUM) 40 MG capsule Take 40 mg by mouth daily. 03/04/18   [provider]  fluticasone (FLONASE) 50 MCG/ACT nasal spray Place 2 sprays into both nostrils daily as needed for allergies.    [provider]  levocetirizine (XYZAL) 5 MG tablet Take 5 mg by mouth every evening.    [provider]  LORazepam (ATIVAN) 1 MG tablet Take 1 mg by mouth at bedtime. 04/09/18   [provider]  magnesium hydroxide (MILK OF MAGNESIA) 400 MG/5ML suspension Take 30 mLs by mouth daily as needed for mild constipation.    [provider]  metoCLOPramide (REGLAN) 5 MG tablet Take 1 tablet (5 mg total) by mouth  every 6 (six) hours as needed for nausea. 08/30/22   Sloan Leiter, DO  metoprolol succinate (TOPROL-XL) 25 MG 24 hr tablet Take 25 mg by mouth daily. 04/12/22   [provider]  montelukast (SINGULAIR) 10 MG tablet TAKE ONE TABLET AT BEDTIME. 04/22/14   Ethelda Chick, MD  ondansetron (ZOFRAN-ODT) 4 MG disintegrating tablet Take 1 tablet (4 mg total) by mouth every 8 (eight) hours as needed for nausea or vomiting. 08/28/22   Elson Areas, PA-C  potassium chloride SA (KLOR-CON M) 20 MEQ tablet Take 1 tablet (20 mEq total) by mouth daily for 5 days. 08/30/22 09/04/22  Tanda Rockers A, DO  potassium chloride SA (KLOR-CON M) 20 MEQ tablet Take 1 tablet (20 mEq total) by mouth 2 (two) times daily. 12/13/22   Vanetta Mulders, MD  rosuvastatin (CRESTOR) 10 MG tablet Take 10 mg by mouth as directed. Take Mon-Fri Only. 03/29/22   [provider]  valsartan (DIOVAN) 80 MG tablet Take 80 mg by mouth at bedtime. 03/07/22   [provider]  venlafaxine (EFFEXOR) 37.5 MG tablet Take 112.5 mg by mouth daily. 07/31/22   [provider]      Allergies    Avelox [moxifloxacin hcl in nacl] and Haldol [haloperidol lactate]    Review of Systems   Review of Systems  Physical Exam Updated Vital Signs BP 120/83 (BP Location: Left Arm)   Pulse 97  Temp 99.4 F (37.4 C) (Oral)   Resp 13   SpO2 97%  Physical Exam Vitals and nursing note reviewed.  Constitutional:      General: She is not in acute distress.    Appearance: She is well-developed. She is not diaphoretic.  HENT:     Head: Normocephalic and atraumatic.  Eyes:     Pupils: Pupils are equal, round, and reactive to light.  Cardiovascular:     Rate and Rhythm: Normal rate and regular rhythm.     Heart sounds: No murmur heard.    No friction rub. No gallop.  Pulmonary:     Effort: Pulmonary effort is normal.     Breath sounds: No wheezing or rales.  Abdominal:     General: There is no distension.      Palpations: Abdomen is soft.     Tenderness: There is no abdominal tenderness.  Musculoskeletal:        General: No tenderness.     Cervical back: Normal range of motion and neck supple.  Skin:    General: Skin is warm and dry.  Neurological:     Mental Status: She is alert and oriented to person, place, and time.  Psychiatric:        Behavior: Behavior normal.     ED Results / Procedures / Treatments   Labs (all labs ordered are listed, but only abnormal results are displayed) Labs Reviewed  CBC WITH DIFFERENTIAL/PLATELET - Abnormal; Notable for the following components:      Result Value   WBC 14.7 (*)    HCT 34.5 (*)    MCV 76.7 (*)    MCHC 36.5 (*)    Neutro Abs 11.4 (*)    Abs Immature Granulocytes 0.11 (*)    All other components within normal limits  COMPREHENSIVE METABOLIC PANEL - Abnormal; Notable for the following components:   Sodium 128 (*)    Potassium 2.4 (*)    Chloride 91 (*)    CO2 18 (*)    Glucose, Bld 133 (*)    Anion gap 19 (*)    All other components within normal limits  LIPASE, BLOOD - Abnormal; Notable for the following components:   Lipase <10 (*)    All other components within normal limits  MAGNESIUM  PHOSPHORUS    EKG EKG Interpretation  Date/Time:  Sunday December 15 2022 13:22:08 EST Ventricular Rate:  100 PR Interval:  164 QRS Duration: 94 QT Interval:  403 QTC Calculation: 520 R Axis:   52 Text Interpretation: Age not entered, assumed to be  62 years old for purpose of ECG interpretation Sinus tachycardia Minimal ST depression, anterolateral leads Prolonged QT interval Otherwise no significant change Confirmed by Deno Etienne [phone]) on 12/15/2022 1:23:45 PM  Radiology No results found.  Procedures .Critical Care  Performed by: Deno Etienne, DO Authorized by: Deno Etienne, DO   Critical care provider statement:    Critical care time (minutes):  35   Critical care time was exclusive of:  Separately billable procedures and  treating other patients   Critical care was time spent personally by me on the following activities:  Development of treatment plan with patient or surrogate, discussions with consultants, evaluation of patient's response to treatment, examination of patient, ordering and review of laboratory studies, ordering and review of radiographic studies, ordering and performing treatments and interventions, pulse oximetry, re-evaluation of patient's condition and review of old charts   Care discussed with: admitting provider  Medications Ordered in ED Medications  potassium chloride 10 mEq in 100 mL IVPB (10 mEq Intravenous New Bag/Given 12/15/22 1419)  0.9 % NaCl with KCl 20 mEq/ L  infusion (has no administration in time range)  0.9 % NaCl with KCl 20 mEq/ L  infusion (has no administration in time range)  pantoprazole (PROTONIX) injection 40 mg (has no administration in time range)  potassium chloride SA (KLOR-CON M) CR tablet 40 mEq (has no administration in time range)  sodium chloride 0.9 % bolus 1,000 mL (1,000 mLs Intravenous New Bag/Given 12/15/22 1220)  droperidol (INAPSINE) 2.5 MG/ML injection 1.25 mg (1.25 mg Intravenous Given 12/15/22 1216)  diphenhydrAMINE (BENADRYL) injection 25 mg (25 mg Intravenous Given 12/15/22 1218)  HYDROmorphone (DILAUDID) injection 1 mg (1 mg Intravenous Given 12/15/22 1220)  magnesium sulfate IVPB 2 g 50 mL (2 g Intravenous New Bag/Given 12/15/22 1307)    ED Course/ Medical Decision Making/ A&P                             Medical Decision Making Amount and/or Complexity of Data Reviewed Labs: ordered. ECG/medicine tests: ordered.  Risk Prescription drug management. Decision regarding hospitalization.   62 yo F with a cc of abdominal pain.  Going on for the past couple days.  Consistent with her history of cyclic vomiting syndrome.  No new symptoms.  Will treat aggressively here.  IV fluids.  Blood work.  Of note the patient did mention to one of the  nurses that she did smoke marijuana a couple days ago.  Patient has hypokalemia on blood work.  Potassium of 2.4.  EKG with prolonged QT.  Will supplement potassium.  Give dose of magnesium.  Will discuss with hospitalist.  The patients results and plan were reviewed and discussed.   Any x-rays performed were independently reviewed by myself.   Differential diagnosis were considered with the presenting HPI.  Medications  potassium chloride 10 mEq in 100 mL IVPB (10 mEq Intravenous New Bag/Given 12/15/22 1419)  0.9 % NaCl with KCl 20 mEq/ L  infusion (has no administration in time range)  0.9 % NaCl with KCl 20 mEq/ L  infusion (has no administration in time range)  pantoprazole (PROTONIX) injection 40 mg (has no administration in time range)  potassium chloride SA (KLOR-CON M) CR tablet 40 mEq (has no administration in time range)  sodium chloride 0.9 % bolus 1,000 mL (1,000 mLs Intravenous New Bag/Given 12/15/22 1220)  droperidol (INAPSINE) 2.5 MG/ML injection 1.25 mg (1.25 mg Intravenous Given 12/15/22 1216)  diphenhydrAMINE (BENADRYL) injection 25 mg (25 mg Intravenous Given 12/15/22 1218)  HYDROmorphone (DILAUDID) injection 1 mg (1 mg Intravenous Given 12/15/22 1220)  magnesium sulfate IVPB 2 g 50 mL (2 g Intravenous New Bag/Given 12/15/22 1307)    Vitals:   12/15/22 0956 12/15/22 1400  BP: (!) 157/103 120/83  Pulse: (!) 120 97  Resp: (!) 28 13  Temp: 97.9 F (36.6 C) 99.4 F (37.4 C)  TempSrc:  Oral  SpO2: 100% 97%    Final diagnoses:  Cyclic vomiting syndrome  Hypokalemia    Admission/ observation were discussed with the admitting physician, patient and/or family and they are comfortable with the plan.          Final Clinical Impression(s) / ED Diagnoses Final diagnoses:  Cyclic vomiting syndrome  Hypokalemia    Rx / DC Orders ED Discharge Orders     None  Melene Plan, DO 12/15/22 1431

## 2022-12-15 NOTE — ED Triage Notes (Signed)
Pt returns states she is vomiting again,dizzy, hyperventilating, abdominal pain, center. Has cyclic vomiting syndrome, last time smoked marijuana 2 days ago.

## 2022-12-16 ENCOUNTER — Encounter (HOSPITAL_COMMUNITY): Payer: Self-pay | Admitting: Gastroenterology

## 2022-12-16 DIAGNOSIS — Z8249 Family history of ischemic heart disease and other diseases of the circulatory system: Secondary | ICD-10-CM | POA: Diagnosis not present

## 2022-12-16 DIAGNOSIS — I10 Essential (primary) hypertension: Secondary | ICD-10-CM | POA: Diagnosis present

## 2022-12-16 DIAGNOSIS — Z881 Allergy status to other antibiotic agents status: Secondary | ICD-10-CM | POA: Diagnosis not present

## 2022-12-16 DIAGNOSIS — K219 Gastro-esophageal reflux disease without esophagitis: Secondary | ICD-10-CM | POA: Diagnosis present

## 2022-12-16 DIAGNOSIS — Z888 Allergy status to other drugs, medicaments and biological substances status: Secondary | ICD-10-CM | POA: Diagnosis not present

## 2022-12-16 DIAGNOSIS — F17210 Nicotine dependence, cigarettes, uncomplicated: Secondary | ICD-10-CM | POA: Diagnosis present

## 2022-12-16 DIAGNOSIS — Z79899 Other long term (current) drug therapy: Secondary | ICD-10-CM | POA: Diagnosis not present

## 2022-12-16 DIAGNOSIS — R1115 Cyclical vomiting syndrome unrelated to migraine: Secondary | ICD-10-CM | POA: Diagnosis not present

## 2022-12-16 DIAGNOSIS — J302 Other seasonal allergic rhinitis: Secondary | ICD-10-CM | POA: Diagnosis present

## 2022-12-16 DIAGNOSIS — Z87410 Personal history of cervical dysplasia: Secondary | ICD-10-CM | POA: Diagnosis not present

## 2022-12-16 DIAGNOSIS — Z716 Tobacco abuse counseling: Secondary | ICD-10-CM | POA: Diagnosis not present

## 2022-12-16 DIAGNOSIS — E871 Hypo-osmolality and hyponatremia: Secondary | ICD-10-CM | POA: Diagnosis present

## 2022-12-16 DIAGNOSIS — F32A Depression, unspecified: Secondary | ICD-10-CM | POA: Diagnosis present

## 2022-12-16 DIAGNOSIS — E876 Hypokalemia: Secondary | ICD-10-CM | POA: Diagnosis present

## 2022-12-16 DIAGNOSIS — F419 Anxiety disorder, unspecified: Secondary | ICD-10-CM | POA: Diagnosis present

## 2022-12-16 DIAGNOSIS — F1210 Cannabis abuse, uncomplicated: Secondary | ICD-10-CM | POA: Diagnosis present

## 2022-12-16 DIAGNOSIS — Z7151 Drug abuse counseling and surveillance of drug abuser: Secondary | ICD-10-CM | POA: Diagnosis not present

## 2022-12-16 DIAGNOSIS — M1990 Unspecified osteoarthritis, unspecified site: Secondary | ICD-10-CM | POA: Diagnosis present

## 2022-12-16 LAB — COMPREHENSIVE METABOLIC PANEL
ALT: 18 U/L (ref 0–44)
AST: 28 U/L (ref 15–41)
Albumin: 3.4 g/dL — ABNORMAL LOW (ref 3.5–5.0)
Alkaline Phosphatase: 47 U/L (ref 38–126)
Anion gap: 9 (ref 5–15)
BUN: 6 mg/dL — ABNORMAL LOW (ref 8–23)
CO2: 19 mmol/L — ABNORMAL LOW (ref 22–32)
Calcium: 8.1 mg/dL — ABNORMAL LOW (ref 8.9–10.3)
Chloride: 99 mmol/L (ref 98–111)
Creatinine, Ser: 0.53 mg/dL (ref 0.44–1.00)
GFR, Estimated: >60 mL/min (ref >60)
Glucose, Bld: 119 mg/dL — ABNORMAL HIGH (ref 70–99)
Potassium: 3.5 mmol/L (ref 3.5–5.1)
Sodium: 127 mmol/L — ABNORMAL LOW (ref 135–145)
Total Bilirubin: 0.8 mg/dL (ref 0.3–1.2)
Total Protein: 5.7 g/dL — ABNORMAL LOW (ref 6.5–8.1)

## 2022-12-16 LAB — BASIC METABOLIC PANEL
Anion gap: 11 (ref 5–15)
BUN: <5 mg/dL — ABNORMAL LOW (ref 8–23)
CO2: 19 mmol/L — ABNORMAL LOW (ref 22–32)
Calcium: 8.2 mg/dL — ABNORMAL LOW (ref 8.9–10.3)
Chloride: 98 mmol/L (ref 98–111)
Creatinine, Ser: 0.59 mg/dL (ref 0.44–1.00)
GFR, Estimated: >60 mL/min (ref >60)
Glucose, Bld: 157 mg/dL — ABNORMAL HIGH (ref 70–99)
Potassium: 3.4 mmol/L — ABNORMAL LOW (ref 3.5–5.1)
Sodium: 128 mmol/L — ABNORMAL LOW (ref 135–145)

## 2022-12-16 LAB — CBC
HCT: 29.4 % — ABNORMAL LOW (ref 36.0–46.0)
Hemoglobin: 10.2 g/dL — ABNORMAL LOW (ref 12.0–15.0)
MCH: 27.9 pg (ref 26.0–34.0)
MCHC: 34.7 g/dL (ref 30.0–36.0)
MCV: 80.3 fL (ref 80.0–100.0)
Platelets: 276 10*3/uL (ref 150–400)
RBC: 3.66 MIL/uL — ABNORMAL LOW (ref 3.87–5.11)
RDW: 15 % (ref 11.5–15.5)
WBC: 11.8 10*3/uL — ABNORMAL HIGH (ref 4.0–10.5)
nRBC: 0 % (ref 0.0–0.2)

## 2022-12-16 LAB — OSMOLALITY, URINE: Osmolality, Ur: 185 mOsm/kg — ABNORMAL LOW (ref 300–900)

## 2022-12-16 LAB — SODIUM, URINE, RANDOM: Sodium, Ur: 67 mmol/L

## 2022-12-16 MED ORDER — MAGNESIUM HYDROXIDE 400 MG/5ML PO SUSP
5.0000 mL | Freq: Once | ORAL | Status: AC
  Administered 2022-12-16: 5 mL via ORAL
  Filled 2022-12-16: qty 30

## 2022-12-16 MED ORDER — DIAZEPAM 5 MG/ML IJ SOLN
5.0000 mg | INTRAMUSCULAR | Status: DC | PRN
  Administered 2022-12-16 – 2022-12-17 (×3): 5 mg via INTRAVENOUS
  Filled 2022-12-16 (×4): qty 2

## 2022-12-16 MED ORDER — POTASSIUM CHLORIDE CRYS ER 20 MEQ PO TBCR
40.0000 meq | EXTENDED_RELEASE_TABLET | Freq: Once | ORAL | Status: AC
  Administered 2022-12-16: 40 meq via ORAL
  Filled 2022-12-16: qty 2

## 2022-12-16 MED ORDER — SODIUM CHLORIDE 1 G PO TABS
1.0000 g | ORAL_TABLET | Freq: Two times a day (BID) | ORAL | Status: DC
  Administered 2022-12-16 – 2022-12-17 (×2): 1 g via ORAL
  Filled 2022-12-16 (×2): qty 1

## 2022-12-16 MED ORDER — HYDROMORPHONE HCL 1 MG/ML IJ SOLN
0.5000 mg | Freq: Once | INTRAMUSCULAR | Status: AC | PRN
  Administered 2022-12-16: 0.5 mg via INTRAVENOUS
  Filled 2022-12-16: qty 0.5

## 2022-12-16 NOTE — Progress Notes (Signed)
Patient anxious and restless, reporting of chills. On call provider notified Valium 5mg  IV given see MAR. Patient positioned for comfort, bed alarm on for safety. Call bell in reach. Will continue to monitor.

## 2022-12-16 NOTE — Plan of Care (Signed)

## 2022-12-16 NOTE — Discharge Summary (Signed)
Physician Discharge Summary  Katie Chang [id number] DOB: 02-Oct-1961 DOA: 12/15/2022  PCP: Josetta Huddle, MD  Admit date: 12/15/2022 Discharge date: 12/17/2022 Recommendations for Outpatient Follow-up:  Follow up with PCP in 1 weeks-call for appointment Please obtain BMP/CBC in one week  Discharge Dispo: Home Discharge Condition: Stable Code Status:   Code Status: Full Code Diet recommendation:  Diet Order             Diet regular Room service appropriate? Yes; Fluid consistency: Thin  Diet effective now           Diet general                    Brief/Interim Summary: 62 y.o.f w/ Asthma/eczema/recurrent sinus infection, osteoarthritis/cervical dysplasia/cervical and lumbar DDD, GERD, hypertension, menopausal symptoms, migraine headaches, cyclic vomiting syndrome, cannabis use disorder   presented with 2-day history of intractable nausea and vomiting.She has been very anxious due to multiple factors. In ED: CBC showed white count of 14.7, hemoglobin 12.6 g/dL platelets 367.  Lipase was less than 10.  CMP shows a sodium 128, potassium 2.4, chloride 91 and CO2 of 18 mmol/L with an anion gap of 19.  Glucose was 133 mg/dL.  Renal function, calcium and LFTs were normal.  Initial vital signs were temperature 97.9 F, pulse 120, respiration 28, BP 157/103 mmHg O2 sat 100% on room air. She was given diphenhydramine 25 mg IVP, droperidol 1.25 mg IVP, hydromorphone 1 mg IVP, magnesium sulfate 2 g IVPB and 1000 mL of normal saline bolus and admitted. Patient was hydrated with IV fluids, electrolytes were replaced.  Her leukocytosis downtrending indicating likely reactive.  Tolerating diet Monitored overnight, has been able to tolerate diet, electrolytes are fairly stable although with chronic hyponatremia.  She will follow-up with PCP to check BMP in few days, until then continue salt tablet  Discharge Diagnoses:  Principal Problem:   Possible Cyclic vomiting syndrome Active Problems:    Hypokalemia   Hyponatremia   Prolonged QT interval   Anxiety   Cannabis use disorder Possible Cyclic vomiting syndrome Cannabis use disorder Resolved, tolerating diet.Seen by GI,follow-up outpatient for possible endoscopic evaluation in future. Continue home regimen   Hypokalemia: Resolved Hyponatremia: Likely some hypovolemia; received IV fluids.  Her sodium has been ranging from 126-130. continue salt tablets advised checking BMP in few days from PCP. Prolonged QT interval: Repeat EKG 1/20 9 AM shows QTc improved to 1 470s Anxiety disorder continue outpatient follow-up Tobacco use disorder cessation advised  Consults: GI  Subjective: Tolerating diet no nausea vomiting ambulating well.  Eager to go home today  Discharge Exam: Vitals:   12/16/22 1945 12/17/22 0411  BP: (!) 153/80 (!) 144/85  Pulse: 99 83  Resp: 17 17  Temp: 98 F (36.7 C) 98.3 F (36.8 C)  SpO2: 100% 100%   General: Pt is alert, awake, not in acute distress Cardiovascular: RRR, S1/S2 +, no rubs, no gallops Respiratory: CTA bilaterally, no wheezing, no rhonchi Abdominal: Soft, NT, ND, bowel sounds + Extremities: no edema, no cyanosis  Discharge Instructions  Discharge Instructions     Diet general   Complete by: As directed    Discharge instructions   Complete by: As directed    Please call call MD or return to ER for similar or worsening recurring problem that brought you to hospital or if any fever,nausea/vomiting,abdominal pain, uncontrolled pain, chest pain,  shortness of breath or any other alarming symptoms.  Please follow-up with your primary care doctor to  check BMP in 3 to 5 days  Please follow-up your doctor as instructed in a week time and call the office for appointment.  Please avoid alcohol, smoking, or any other illicit substance and maintain healthy habits including taking your regular medications as prescribed.  You were cared for by a hospitalist during your hospital stay. If you  have any questions about your discharge medications or the care you received while you were in the hospital after you are discharged, you can call the unit and ask to speak with the hospitalist on call if the hospitalist that took care of you is not available.  Once you are discharged, your primary care physician will handle any further medical issues. Please note that NO REFILLS for any discharge medications will be authorized once you are discharged, as it is imperative that you return to your primary care physician (or establish a relationship with a primary care physician if you do not have one) for your aftercare needs so that they can reassess your need for medications and monitor your lab values   Increase activity slowly   Complete by: As directed       Allergies as of 12/17/2022       Reactions   Avelox [moxifloxacin Hcl In Nacl] Hives   No allergic reaction to Levaquin.   Haldol [haloperidol Lactate] Other (See Comments)   "Night terrors"        Medication List     TAKE these medications    albuterol 108 (90 Base) MCG/ACT inhaler Commonly known as: ProAir HFA Inhale 2 puffs into the lungs every 6 (six) hours as needed for wheezing.   amLODipine 5 MG tablet Commonly known as: NORVASC Take 5 mg by mouth daily.   capsaicin-methyl sal-menthol 0.025-1-12 % Crea Generic drug: Capsaicin-Menthol-Methyl Sal Apply 1 Application topically 2 (two) times daily as needed. What changed: reasons to take this   celecoxib 200 MG capsule Commonly known as: CELEBREX Take 200 mg by mouth daily.   cholecalciferol 25 MCG (1000 UNIT) tablet Commonly known as: VITAMIN D3 Take 2,000 Units by mouth daily.   esomeprazole 40 MG capsule Commonly known as: NEXIUM Take 40 mg by mouth daily.   fluticasone 50 MCG/ACT nasal spray Commonly known as: FLONASE Place 2 sprays into both nostrils daily as needed for allergies.   levocetirizine 5 MG tablet Commonly known as: XYZAL Take 5 mg by  mouth every evening.   LORazepam 1 MG tablet Commonly known as: ATIVAN Take 1 mg by mouth at bedtime.   magnesium hydroxide 400 MG/5ML suspension Commonly known as: MILK OF MAGNESIA Take 30 mLs by mouth daily as needed for mild constipation.   metoCLOPramide 5 MG tablet Commonly known as: REGLAN Take 1 tablet (5 mg total) by mouth every 6 (six) hours as needed for nausea.   metoprolol succinate 25 MG 24 hr tablet Commonly known as: TOPROL-XL Take 25 mg by mouth daily.   montelukast 10 MG tablet Commonly known as: SINGULAIR TAKE ONE TABLET AT BEDTIME.   ondansetron 4 MG disintegrating tablet Commonly known as: ZOFRAN-ODT Take 1 tablet (4 mg total) by mouth every 8 (eight) hours as needed for nausea or vomiting.   ondansetron 4 MG tablet Commonly known as: ZOFRAN Take 4-8 mg by mouth every 6 (six) hours as needed for nausea or vomiting.   potassium chloride SA 20 MEQ tablet Commonly known as: KLOR-CON M Take 1 tablet (20 mEq total) by mouth daily for 5 days.   potassium chloride  SA 20 MEQ tablet Commonly known as: KLOR-CON M Take 1 tablet (20 mEq total) by mouth 2 (two) times daily.   rosuvastatin 10 MG tablet Commonly known as: CRESTOR Take 10 mg by mouth as directed. Take Mon-Fri Only.   sodium chloride 1 g tablet Take 1 tablet (1 g total) by mouth 2 (two) times daily with a meal for 7 days.   valsartan 80 MG tablet Commonly known as: DIOVAN Take 80 mg by mouth at bedtime.   venlafaxine 37.5 MG tablet Commonly known as: EFFEXOR Take 112.5 mg by mouth daily.        Follow-up Information     Josetta Huddle, MD Follow up in 1 week(s).   Specialty: Internal Medicine Contact information: [address] [place] [address] [phone]                Allergies  Allergen Reactions   Avelox [Moxifloxacin Hcl In Nacl] Hives    No allergic reaction to Levaquin.   Haldol [Haloperidol Lactate] Other (See Comments)    "Night terrors"     The results of significant diagnostics from this hospitalization (including imaging, microbiology, ancillary and laboratory) are listed below for reference.    Microbiology: No results found for this or any previous visit (from the past 240 hour(s)).  Procedures/Studies: No results found.  Labs: BNP (last 3 results) No results for input(s): "BNP" in the last 8760 hours. Basic Metabolic Panel: Recent Labs  Lab 12/15/22 1153 12/15/22 1559 12/15/22 1754 12/15/22 1848 12/16/22 0509 12/16/22 1323 12/17/22 0531  NA 128*  --   --  128* 127* 128* 127*  K 2.4*  --   --  2.7* 3.5 3.4* 4.0  CL 91*  --   --  96* 99 98 99  CO2 18*  --   --  21* 19* 19* 21*  GLUCOSE 133*  --   --  142* 119* 157* 115*  BUN 18  --   --  13 6* <5* 6*  CREATININE 0.80  --   --  0.70 0.53 0.59 0.56  CALCIUM 9.8  --   --  8.1* 8.1* 8.2* 8.6*  MG  --  2.5* 1.7  --   --   --   --   PHOS  --  3.8 2.1*  --   --   --   --    Liver Function Tests: Recent Labs  Lab 12/13/22 1614 12/15/22 1153 12/16/22 0509  AST 27 38 28  ALT 18 22 18   ALKPHOS 68 65 47  BILITOT 0.6 1.1 0.8  PROT 7.3 7.6 5.7*  ALBUMIN 4.3 4.9 3.4*   Recent Labs  Lab 12/13/22 1614 12/15/22 1153  LIPASE 25 <10*   No results for input(s): "AMMONIA" in the last 168 hours. CBC: Recent Labs  Lab 12/13/22 1614 12/15/22 1153 12/16/22 0509  WBC 10.1 14.7* 11.8*  NEUTROABS  --  11.4*  --   HGB 13.4 12.6 10.2*  HCT 39.3 34.5* 29.4*  MCV 81.5 76.7* 80.3  PLT 361 367 276  Anemia work up No results for input(s): "VITAMINB12", "FOLATE", "FERRITIN", "TIBC", "IRON", "RETICCTPCT" in the last 72 hours. Urinalysis    Component Value Date/Time   COLORURINE STRAW (A) 12/13/2022 2235   APPEARANCEUR CLEAR 12/13/2022 2235   LABSPEC 1.010 12/13/2022 2235   PHURINE 8.0 12/13/2022 2235   GLUCOSEU NEGATIVE 12/13/2022 2235   HGBUR NEGATIVE 12/13/2022 2235   BILIRUBINUR NEGATIVE 12/13/2022 2235   KETONESUR 20 (A)  12/13/2022 2235   PROTEINUR  NEGATIVE 12/13/2022 2235   UROBILINOGEN 0.2 12/29/2014 1357   NITRITE NEGATIVE 12/13/2022 2235   LEUKOCYTESUR NEGATIVE 12/13/2022 2235   Sepsis Labs Recent Labs  Lab 12/13/22 1614 12/15/22 1153 12/16/22 0509  WBC 10.1 14.7* 11.8*   Microbiology No results found for this or any previous visit (from the past 240 hour(s)).  Time coordinating discharge: 25 minutes  SIGNED: Lanae Boast, MD  [facility] 12/17/2022, 11:21 AM  If 7PM-7AM, please contact night-coverage [email or web address]

## 2022-12-16 NOTE — Hospital Course (Addendum)
62 y.o.f w/ Asthma/eczema/recurrent sinus infection, osteoarthritis/cervical dysplasia/cervical and lumbar DDD, GERD, hypertension, menopausal symptoms, migraine headaches, cyclic vomiting syndrome, cannabis use disorder   presented with 2-day history of intractable nausea and vomiting.She has been very anxious due to multiple factors. In ED: CBC showed white count of 14.7, hemoglobin 12.6 g/dL platelets 367.  Lipase was less than 10.  CMP shows a sodium 128, potassium 2.4, chloride 91 and CO2 of 18 mmol/L with an anion gap of 19.  Glucose was 133 mg/dL.  Renal function, calcium and LFTs were normal.  Initial vital signs were temperature 97.9 F, pulse 120, respiration 28, BP 157/103 mmHg O2 sat 100% on room air. She was given diphenhydramine 25 mg IVP, droperidol 1.25 mg IVP, hydromorphone 1 mg IVP, magnesium sulfate 2 g IVPB and 1000 mL of normal saline bolus and admitted. Patient was hydrated with IV fluids, electrolytes were replaced.  Her leukocytosis downtrending indicating likely reactive.  Tolerating diet Monitored overnight, has been able to tolerate diet, electrolytes are fairly stable although with chronic hyponatremia.  She will follow-up with PCP to check BMP in few days, until then continue salt tablet

## 2022-12-16 NOTE — Progress Notes (Signed)
Mobility Specialist - Progress Note   12/16/22 1254  Mobility  Activity Ambulated with assistance in hallway  Level of Assistance Modified independent, requires aide device or extra time  Assistive Device None  Distance Ambulated (ft) 750 ft  Activity Response Tolerated well  Mobility Referral Yes  $Mobility charge 1 Mobility   Pt received in chair and agreed to mobility, had no issues during session. Pt returned to chair with all needs met.  Roderick Pee Mobility Specialist

## 2022-12-16 NOTE — Progress Notes (Signed)
Mobility Specialist - Progress Note   12/16/22 0930  Mobility  Activity Ambulated with assistance in hallway  Level of Assistance Modified independent, requires aide device or extra time  Assistive Device None  Distance Ambulated (ft) 750 ft  Activity Response Tolerated well  Mobility Referral Yes  $Mobility charge 1 Mobility   Pt received in bed and agreed to mobility, pt was stand by for sit to stand. Pt requesting another session in the PM and was left in chair with all needs met.   Roderick Pee Mobility Specialist

## 2022-12-16 NOTE — Progress Notes (Signed)
  Transition of Care [facility]) Screening Note   Patient Details  Name: Natalynn Pedone Date of Birth: 08/23/1961   [facility]) CM/SW Contact:    Vassie Moselle, LCSW Phone Number: 12/16/2022, 9:02 AM    Transition of Care Department [facility]) has reviewed patient and no TOC needs have been identified at this time. We will continue to monitor patient advancement through interdisciplinary progression rounds. If new patient transition needs arise, please place a TOC consult.

## 2022-12-16 NOTE — Consult Note (Signed)
Reason for Consult: Nausea vomiting Referring Physician: Hospital team  Katie Chang is an 62 y.o. female.  HPI: Patient seen and examined and her office computer chart and her hospital computer chart was reviewed particularly her previous workup and she has been smoking marijuana for years but did quit when she was followed at the pain clinic and they tested her frequently and she does not remember whether she did not have any spells then and her other triggers are usually empty stomach or drinking alcohol she says she is off narcotics but I did not challenge her about the positive tox screen and in between the spells she is fine without GI issues she might be due for a screening colonoscopy her family history is negative for any nausea vomiting issues although one of her nieces has what sounds like irritable bowel but but cannot tell her what is wrong although she has significant pain and diarrhea and the patient is feeling much better and we discussed outpatient follow-up she does say Nexium helps her reflux which was really bad until she started on that and has been fine since from a reflux standpoint  Past Medical History:  Diagnosis Date   Allergy    Arthritis    Asthma    Cervical dysplasia    Cyclic vomiting syndrome    DDD (degenerative disc disease), cervical    Cervical and Lumbar   Depression    Eczema    GERD (gastroesophageal reflux disease)    Hypertension    Menopausal symptoms    Migraines    PONV (postoperative nausea and vomiting)    sometimes nausea   Recurrent sinus infections     Past Surgical History:  Procedure Laterality Date   CERVICAL BIOPSY     Dr. Eda Paschal   COLONOSCOPY W/ POLYPECTOMY     COLPOSCOPY     ESOPHAGOGASTRODUODENOSCOPY (EGD) WITH PROPOFOL N/A 05/13/2016   Procedure: ESOPHAGOGASTRODUODENOSCOPY (EGD) WITH PROPOFOL;  Surgeon: Charolett Bumpers, MD;  Location: [facility];  Service: Endoscopy;  Laterality: N/A;   KNEE SURGERY     NASAL SINUS  SURGERY  1992   NECK SURGERY     TONSILLECTOMY  1990    Family History  Problem Relation Age of Onset   Heart disease Mother    Hypertension Father    Diabetes Paternal Grandfather    Heart disease Paternal Grandfather    Stroke Maternal Grandmother    Stroke Maternal Grandfather    Breast cancer Maternal Aunt        Age 33's    Social History:  reports that she has been smoking cigarettes. She has a 15.00 pack-year smoking history. She has never used smokeless tobacco. She reports current drug use. Drug: Marijuana. She reports that she does not drink alcohol.  Allergies:  Allergies  Allergen Reactions   Avelox [Moxifloxacin Hcl In Nacl] Hives    No allergic reaction to Levaquin.   Haldol [Haloperidol Lactate] Other (See Comments)    "Night terrors"    Medications: I have reviewed the patient's current medications.  Results for orders placed or performed during the hospital encounter of 12/15/22 (from the past 48 hour(s))  CBC with Differential     Status: Abnormal   Collection Time: 12/15/22 11:53 AM  Result Value Ref Range   WBC 14.7 (H) 4.0 - 10.5 K/uL   RBC 4.50 3.87 - 5.11 MIL/uL   Hemoglobin 12.6 12.0 - 15.0 g/dL   HCT 54.9 (L) 82.6 - 41.5 %   MCV  76.7 (L) 80.0 - 100.0 fL   MCH 28.0 26.0 - 34.0 pg   MCHC 36.5 (H) 30.0 - 36.0 g/dL   RDW 14.4 11.5 - 15.5 %   Platelets 367 150 - 400 K/uL   nRBC 0.0 0.0 - 0.2 %   Neutrophils Relative % 76 %   Neutro Abs 11.4 (H) 1.7 - 7.7 K/uL   Lymphocytes Relative 17 %   Lymphs Abs 2.6 0.7 - 4.0 K/uL   Monocytes Relative 6 %   Monocytes Absolute 1.0 0.1 - 1.0 K/uL   Eosinophils Relative 0 %   Eosinophils Absolute 0.1 0.0 - 0.5 K/uL   Basophils Relative 0 %   Basophils Absolute 0.0 0.0 - 0.1 K/uL   Immature Granulocytes 1 %   Abs Immature Granulocytes 0.11 (H) 0.00 - 0.07 K/uL    Comment: Performed at [facility], [address], [place], [place] [address]  Comprehensive metabolic panel     Status:  Abnormal   Collection Time: 12/15/22 11:53 AM  Result Value Ref Range   Sodium 128 (L) 135 - 145 mmol/L   Potassium 2.4 (LL) 3.5 - 5.1 mmol/L    Comment: CRITICAL RESULT CALLED TO, READ BACK BY AND VERIFIED WITH: A,BARLOW AT 1229 ON 12/15/22 BY A,MOHAMED REPEATED TO VERIFY    Chloride 91 (L) 98 - 111 mmol/L   CO2 18 (L) 22 - 32 mmol/L   Glucose, Bld 133 (H) 70 - 99 mg/dL    Comment: Glucose reference range applies only to samples taken after fasting for at least 8 hours.   BUN 18 8 - 23 mg/dL   Creatinine, Ser 0.80 0.44 - 1.00 mg/dL   Calcium 9.8 8.9 - 10.3 mg/dL   Total Protein 7.6 6.5 - 8.1 g/dL   Albumin 4.9 3.5 - 5.0 g/dL   AST 38 15 - 41 U/L   ALT 22 0 - 44 U/L   Alkaline Phosphatase 65 38 - 126 U/L   Total Bilirubin 1.1 0.3 - 1.2 mg/dL   GFR, Estimated >60 >60 mL/min    Comment: (NOTE) Calculated using the CKD-EPI Creatinine Equation (2021)    Anion gap 19 (H) 5 - 15    Comment: Performed at [facility], [place], [place] [address]  Lipase, blood     Status: Abnormal   Collection Time: 12/15/22 11:53 AM  Result Value Ref Range   Lipase <10 (L) 11 - 51 U/L    Comment: Performed at [facility], [address], [place], [place] [address]  Magnesium     Status: Abnormal   Collection Time: 12/15/22  3:59 PM  Result Value Ref Range   Magnesium 2.5 (H) 1.7 - 2.4 mg/dL    Comment: Performed at [facility], [place] [address], [place], [place] [address]  Phosphorus     Status: None   Collection Time: 12/15/22  3:59 PM  Result Value Ref Range   Phosphorus 3.8 2.5 - 4.6 mg/dL    Comment: Performed at [facility], [place] [address] [place], [place] [address]  Magnesium     Status: None   Collection Time: 12/15/22  5:54 PM  Result Value Ref Range   Magnesium 1.7 1.7 - 2.4 mg/dL    Comment: Performed at [facility], [address], [place], [place] [address]   Phosphorus     Status: Abnormal   Collection Time: 12/15/22  5:54 PM  Result Value Ref Range   Phosphorus  2.1 (L) 2.5 - 4.6 mg/dL    Comment: Performed at [facility], [address] [place], [place] [address]  Basic metabolic panel     Status: Abnormal   Collection Time: 12/15/22  6:48 PM  Result Value Ref Range   Sodium 128 (L) 135 - 145 mmol/L    Comment: DELTA CHECK NOTED   Potassium 2.7 (LL) 3.5 - 5.1 mmol/L    Comment: CRITICAL RESULT CALLED TO, READ BACK BY AND VERIFIED WITH REGALA, B., RN AT 1923 ON 12/15/22 BY LJM    Chloride 96 (L) 98 - 111 mmol/L   CO2 21 (L) 22 - 32 mmol/L   Glucose, Bld 142 (H) 70 - 99 mg/dL    Comment: Glucose reference range applies only to samples taken after fasting for at least 8 hours.   BUN 13 8 - 23 mg/dL   Creatinine, Ser 1.66 0.44 - 1.00 mg/dL   Calcium 8.1 (L) 8.9 - 10.3 mg/dL   GFR, Estimated >06 >30 mL/min    Comment: (NOTE) Calculated using the CKD-EPI Creatinine Equation (2021)    Anion gap 11 5 - 15    Comment: Performed at [facility], [address] [place], [place] [address]  CBC     Status: Abnormal   Collection Time: 12/16/22  5:09 AM  Result Value Ref Range   WBC 11.8 (H) 4.0 - 10.5 K/uL   RBC 3.66 (L) 3.87 - 5.11 MIL/uL   Hemoglobin 10.2 (L) 12.0 - 15.0 g/dL   HCT 93.2 (L) 35.5 - 73.2 %   MCV 80.3 80.0 - 100.0 fL   MCH 27.9 26.0 - 34.0 pg   MCHC 34.7 30.0 - 36.0 g/dL   RDW 20.2 54.2 - 70.6 %   Platelets 276 150 - 400 K/uL   nRBC 0.0 0.0 - 0.2 %    Comment: Performed at [facility], [address] [place], [place] [address]  Comprehensive metabolic panel     Status: Abnormal   Collection Time: 12/16/22  5:09 AM  Result Value Ref Range   Sodium 127 (L) 135 - 145 mmol/L   Potassium 3.5 3.5 - 5.1 mmol/L   Chloride 99 98 - 111 mmol/L   CO2 19 (L) 22 - 32 mmol/L   Glucose, Bld 119 (H) 70 - 99 mg/dL    Comment: Glucose reference range applies only to samples  taken after fasting for at least 8 hours.   BUN 6 (L) 8 - 23 mg/dL   Creatinine, Ser 8.31 0.44 - 1.00 mg/dL   Calcium 8.1 (L) 8.9 - 10.3 mg/dL   Total Protein 5.7 (L) 6.5 - 8.1 g/dL   Albumin 3.4 (L) 3.5 - 5.0 g/dL   AST 28 15 - 41 U/L   ALT 18 0 - 44 U/L   Alkaline Phosphatase 47 38 - 126 U/L   Total Bilirubin 0.8 0.3 - 1.2 mg/dL   GFR, Estimated >51 >76 mL/min    Comment: (NOTE) Calculated using the CKD-EPI Creatinine Equation (2021)    Anion gap 9 5 - 15    Comment: Performed at [facility], [address], [place], [place] [address]  Basic metabolic panel     Status: Abnormal   Collection Time: 12/16/22  1:23 PM  Result Value Ref Range   Sodium 128 (L) 135 - 145 mmol/L   Potassium 3.4 (L) 3.5 - 5.1 mmol/L   Chloride 98 98 - 111 mmol/L   CO2 19 (L)  22 - 32 mmol/L   Glucose, Bld 157 (H) 70 - 99 mg/dL    Comment: Glucose reference range applies only to samples taken after fasting for at least 8 hours.   BUN <5 (L) 8 - 23 mg/dL   Creatinine, Ser 0.59 0.44 - 1.00 mg/dL   Calcium 8.2 (L) 8.9 - 10.3 mg/dL   GFR, Estimated >60 >60 mL/min    Comment: (NOTE) Calculated using the CKD-EPI Creatinine Equation (2021)    Anion gap 11 5 - 15    Comment: Performed at [facility], [place] [address] [place], [place] [address]    No results found.  ROS negative except above Blood pressure (!) 142/87, pulse 96, temperature 98 F (36.7 C), resp. rate 20, height 5\' 3"  (1.6 m), weight 73.1 kg, SpO2 100 %. Physical Exam vital signs stable afebrile no acute distress exam pertinent for abdomen being soft nontender no succussion splash  Assessment/Plan: Cyclic nausea vomiting questionably worse with marijuana which we thoroughly discussed Plan: Will see her back as an outpatient to probably set her up for repeat colon screening and a repeat endoscopy just to be sure in the future might even consider a gastric emptying study which we discussed and she has not  had but I told her to go 6 months without smoking and if she did not have a attack over that period go another 6 months without marijuana and continue to abstain and see if she has any recurrences and she will call me as needed and follow-up as needed as above please call me if I could be of any further assistance with this hospital stay  [facility] 12/16/2022, 3:40 PM

## 2022-12-16 NOTE — Progress Notes (Signed)
PROGRESS NOTE Katie Chang  IHK:[id number] DOB: May 27, 1961 DOA: 12/15/2022 PCP: Josetta Huddle, MD   Brief Narrative/Hospital Course: 62 y.o.f w/ Asthma/eczema/recurrent sinus infection, osteoarthritis/cervical dysplasia/cervical and lumbar DDD, GERD, hypertension, menopausal symptoms, migraine headaches, cyclic vomiting syndrome, cannabis use disorder   presented with 2-day history of intractable nausea and vomiting.She has been very anxious due to multiple factors. In ED: CBC showed white count of 14.7, hemoglobin 12.6 g/dL platelets 367.  Lipase was less than 10.  CMP shows a sodium 128, potassium 2.4, chloride 91 and CO2 of 18 mmol/L with an anion gap of 19.  Glucose was 133 mg/dL.  Renal function, calcium and LFTs were normal.  Initial vital signs were temperature 97.9 F, pulse 120, respiration 28, BP 157/103 mmHg O2 sat 100% on room air. She was given diphenhydramine 25 mg IVP, droperidol 1.25 mg IVP, hydromorphone 1 mg IVP, magnesium sulfate 2 g IVPB and 1000 mL of normal saline bolus and admitted. Patient was hydrated with IV fluids, electrolytes were replaced.  Her leukocytosis downtrending indicating likely reactive.  Tolerating diet      Subjective:    Assessment and Plan: Principal Problem:   Possible Cyclic vomiting syndrome Active Problems:   Hypokalemia   Hyponatremia   Prolonged QT interval   Anxiety   Cannabis use disorder   Possible Cyclic vomiting syndrome Cannabis use disorder Symptoms are better.Tolerated CLD>advanced to full liquid diet then advance to soft diet . Cont PPI Encourage cannabis cessation.   Hypokalemia: In the setting of vomiting.  Still low on recheck, replete p.o. Hyponatremia: Likely hypovolemi,c acute on chronic ranging 126-130.  But 136 on 1/26. Slowly improving, check lites, add salt tablets, continue IV hydration Prolonged QT interval: Repeat EKG 1/20 9 AM shows QTc improved to 1 470s Anxiety disorder continue outpatient follow-up Tobacco use  disorder cessation advised  DVT prophylaxis: SCDs Start: 12/15/22 1622 Code Status:   Code Status: Full Code Family Communication: plan of care discussed with patient at bedside. Patient status is: admitted as observation but remains hospitalized for ongoing management for [place] imbalance, diet tolerance  Level of care: Telemetry  Dispo: The patient is from: home            Anticipated disposition: home tomorrow if tolerating diet and electrolytes stable Objective: Vitals last 24 hrs: Vitals:   12/15/22 1946 12/15/22 2339 12/16/22 0353 12/16/22 1325  BP: 113/66 116/69 (!) 173/90 (!) 142/87  Pulse: 86 88 92 96  Resp: 18 18 18 20   Temp: 97.6 F (36.4 C) 97.6 F (36.4 C) 98.9 F (37.2 C) 98 F (36.7 C)  TempSrc: Oral Oral Oral   SpO2: 97% 98% 98% 100%  Weight:      Height:       Weight change:   Physical Examination:  General exam: alert awake, older than stated age HEENT:Oral mucosa moist, Ear/Nose WNL grossly Respiratory system: bilaterally clear BS, no use of accessory muscle Cardiovascular system: S1 & S2 +, No JVD. Gastrointestinal system: Abdomen soft,NT,ND, BS+ Nervous System:Alert, awake, moving extremities. Extremities: LE edema neg,distal peripheral pulses palpable.  Skin: No rashes,no icterus. MSK: Normal muscle bulk,tone, power  Medications reviewed:  Scheduled Meds:  nicotine  21 mg Transdermal Daily   pantoprazole (PROTONIX) IV  40 mg Intravenous Q12H   potassium chloride  40 mEq Oral Once  Continuous Infusions:  0.9 % NaCl with KCl 40 mEq / L 125 mL/hr at 12/16/22 1209    Diet Order  Diet full liquid Room service appropriate? Yes; Fluid consistency: Thin  Diet effective now           Diet general                  Intake/Output Summary (Last 24 hours) at 12/16/2022 1405 Last data filed at 12/16/2022 0727 Gross per 24 hour  Intake 2889.32 ml  Output --  Net 2889.32 ml   Net IO Since Admission: 2,889.32 mL [12/16/22 1405]   Wt Readings from Last 3 Encounters:  12/15/22 73.1 kg  12/13/22 72.6 kg  08/30/22 69.9 kg    Unresulted Labs (From admission, onward)     Start     Ordered   12/16/22 1405  Sodium, urine, random  Once,   R        12/16/22 1404   12/16/22 1405  Osmolality, urine  Once,   R        12/16/22 1404          Data Reviewed: I have personally reviewed following labs and imaging studies CBC: Recent Labs  Lab 12/13/22 1614 12/15/22 1153 12/16/22 0509  WBC 10.1 14.7* 11.8*  NEUTROABS  --  11.4*  --   HGB 13.4 12.6 10.2*  HCT 39.3 34.5* 29.4*  MCV 81.5 76.7* 80.3  PLT 361 367 347   Basic Metabolic Panel: Recent Labs  Lab 12/13/22 1614 12/15/22 1153 12/15/22 1559 12/15/22 1754 12/15/22 1848 12/16/22 0509 12/16/22 1323  NA 136 128*  --   --  128* 127* 128*  K 3.2* 2.4*  --   --  2.7* 3.5 3.4*  CL 104 91*  --   --  96* 99 98  CO2 19* 18*  --   --  21* 19* 19*  GLUCOSE 146* 133*  --   --  142* 119* 157*  BUN 12 18  --   --  13 6* <5*  CREATININE 0.68 0.80  --   --  0.70 0.53 0.59  CALCIUM 9.6 9.8  --   --  8.1* 8.1* 8.2*  MG  --   --  2.5* 1.7  --   --   --   PHOS  --   --  3.8 2.1*  --   --   --    GFR: Estimated Creatinine Clearance: 70.8 mL/min (by C-G formula based on SCr of 0.59 mg/dL). Liver Function Tests: Recent Labs  Lab 12/13/22 1614 12/15/22 1153 12/16/22 0509  AST 27 38 28  ALT 18 22 18   ALKPHOS 68 65 47  BILITOT 0.6 1.1 0.8  PROT 7.3 7.6 5.7*  ALBUMIN 4.3 4.9 3.4*   Recent Labs  Lab 12/13/22 1614 12/15/22 1153  LIPASE 25 <10*   No results found for this or any previous visit (from the past 240 hour(s)).  Antimicrobials: Anti-infectives (From admission, onward)    None      Culture/Microbiology No results found for: "SDES", "SPECREQUEST", "CULT", "REPTSTATUS"  adiology Studies: No results found.   LOS: 0 days   Antonieta Pert, MD [facility]  12/16/2022, 2:05 PM

## 2022-12-17 DIAGNOSIS — R1115 Cyclical vomiting syndrome unrelated to migraine: Secondary | ICD-10-CM | POA: Diagnosis not present

## 2022-12-17 LAB — BASIC METABOLIC PANEL
Anion gap: 7 (ref 5–15)
BUN: 6 mg/dL — ABNORMAL LOW (ref 8–23)
CO2: 21 mmol/L — ABNORMAL LOW (ref 22–32)
Calcium: 8.6 mg/dL — ABNORMAL LOW (ref 8.9–10.3)
Chloride: 99 mmol/L (ref 98–111)
Creatinine, Ser: 0.56 mg/dL (ref 0.44–1.00)
GFR, Estimated: >60 mL/min (ref >60)
Glucose, Bld: 115 mg/dL — ABNORMAL HIGH (ref 70–99)
Potassium: 4 mmol/L (ref 3.5–5.1)
Sodium: 127 mmol/L — ABNORMAL LOW (ref 135–145)

## 2022-12-17 MED ORDER — SODIUM CHLORIDE 1 G PO TABS: 1.0000 g | ORAL_TABLET | Freq: Two times a day (BID) | ORAL | 0 refills | Status: AC

## 2022-12-17 NOTE — Progress Notes (Signed)
Patient discharged to home, AVS reviewed, IV removed, telebox returned. Staff escorted her to the exit, family provided transportation

## 2022-12-17 NOTE — Progress Notes (Signed)
Mobility Specialist - Progress Note   12/17/22 0902  Mobility  Activity Ambulated with assistance in hallway  Level of Assistance Modified independent, requires aide device or extra time  Assistive Device None  Distance Ambulated (ft) 1000 ft  Activity Response Tolerated well  Mobility Referral Yes  $Mobility charge 1 Mobility   Pt received in bench and agreed to mobility. Pt had no issues during session. Pt returned to chair with all needs met, requesting another session later on.   Roderick Pee Mobility Specialist

## 2022-12-20 DIAGNOSIS — R1115 Cyclical vomiting syndrome unrelated to migraine: Secondary | ICD-10-CM | POA: Diagnosis not present

## 2022-12-20 DIAGNOSIS — E876 Hypokalemia: Secondary | ICD-10-CM | POA: Diagnosis not present

## 2022-12-20 DIAGNOSIS — D72828 Other elevated white blood cell count: Secondary | ICD-10-CM | POA: Diagnosis not present

## 2022-12-20 DIAGNOSIS — E871 Hypo-osmolality and hyponatremia: Secondary | ICD-10-CM | POA: Diagnosis not present

## 2022-12-24 ENCOUNTER — Emergency Department (HOSPITAL_COMMUNITY)
Admission: EM | Admit: 2022-12-24 | Discharge: 2022-12-25 | Disposition: A | Discharge status: Home / Self Care | Payer: BC Managed Care – PPO | Attending: Emergency Medicine | Admitting: Emergency Medicine

## 2022-12-24 ENCOUNTER — Other Ambulatory Visit: Payer: Self-pay

## 2022-12-24 ENCOUNTER — Emergency Department (HOSPITAL_COMMUNITY): Payer: BC Managed Care – PPO

## 2022-12-24 DIAGNOSIS — I10 Essential (primary) hypertension: Secondary | ICD-10-CM | POA: Insufficient documentation

## 2022-12-24 DIAGNOSIS — Z79899 Other long term (current) drug therapy: Secondary | ICD-10-CM | POA: Diagnosis not present

## 2022-12-24 DIAGNOSIS — R112 Nausea with vomiting, unspecified: Secondary | ICD-10-CM

## 2022-12-24 DIAGNOSIS — R000 Tachycardia, unspecified: Secondary | ICD-10-CM | POA: Insufficient documentation

## 2022-12-24 DIAGNOSIS — R1013 Epigastric pain: Secondary | ICD-10-CM | POA: Diagnosis not present

## 2022-12-24 DIAGNOSIS — R1110 Vomiting, unspecified: Secondary | ICD-10-CM | POA: Diagnosis not present

## 2022-12-24 DIAGNOSIS — E860 Dehydration: Secondary | ICD-10-CM | POA: Diagnosis not present

## 2022-12-24 DIAGNOSIS — E876 Hypokalemia: Secondary | ICD-10-CM | POA: Diagnosis not present

## 2022-12-24 DIAGNOSIS — Z7951 Long term (current) use of inhaled steroids: Secondary | ICD-10-CM | POA: Insufficient documentation

## 2022-12-24 DIAGNOSIS — F12188 Cannabis abuse with other cannabis-induced disorder: Secondary | ICD-10-CM | POA: Diagnosis not present

## 2022-12-24 DIAGNOSIS — D72829 Elevated white blood cell count, unspecified: Secondary | ICD-10-CM | POA: Diagnosis not present

## 2022-12-24 DIAGNOSIS — R110 Nausea: Secondary | ICD-10-CM | POA: Diagnosis not present

## 2022-12-24 DIAGNOSIS — R109 Unspecified abdominal pain: Secondary | ICD-10-CM | POA: Diagnosis not present

## 2022-12-24 DIAGNOSIS — R0689 Other abnormalities of breathing: Secondary | ICD-10-CM | POA: Diagnosis not present

## 2022-12-24 DIAGNOSIS — J45909 Unspecified asthma, uncomplicated: Secondary | ICD-10-CM | POA: Diagnosis not present

## 2022-12-24 LAB — CBC WITH DIFFERENTIAL/PLATELET
Abs Immature Granulocytes: 0.07 10*3/uL (ref 0.00–0.07)
Basophils Absolute: 0 10*3/uL (ref 0.0–0.1)
Basophils Relative: 0 %
Eosinophils Absolute: 0 10*3/uL (ref 0.0–0.5)
Eosinophils Relative: 0 %
HCT: 35.7 % — ABNORMAL LOW (ref 36.0–46.0)
Hemoglobin: 12.3 g/dL (ref 12.0–15.0)
Immature Granulocytes: 1 %
Lymphocytes Relative: 13 %
Lymphs Abs: 1.4 10*3/uL (ref 0.7–4.0)
MCH: 27.8 pg (ref 26.0–34.0)
MCHC: 34.5 g/dL (ref 30.0–36.0)
MCV: 80.8 fL (ref 80.0–100.0)
Monocytes Absolute: 0.3 10*3/uL (ref 0.1–1.0)
Monocytes Relative: 3 %
Neutro Abs: 9.5 10*3/uL — ABNORMAL HIGH (ref 1.7–7.7)
Neutrophils Relative %: 83 %
Platelets: 616 10*3/uL — ABNORMAL HIGH (ref 150–400)
RBC: 4.42 MIL/uL (ref 3.87–5.11)
RDW: 15.4 % (ref 11.5–15.5)
WBC: 11.4 10*3/uL — ABNORMAL HIGH (ref 4.0–10.5)
nRBC: 0 % (ref 0.0–0.2)

## 2022-12-24 LAB — COMPREHENSIVE METABOLIC PANEL
ALT: 25 U/L (ref 0–44)
AST: 37 U/L (ref 15–41)
Albumin: 4.5 g/dL (ref 3.5–5.0)
Alkaline Phosphatase: 66 U/L (ref 38–126)
Anion gap: 17 — ABNORMAL HIGH (ref 5–15)
BUN: 10 mg/dL (ref 8–23)
CO2: 18 mmol/L — ABNORMAL LOW (ref 22–32)
Calcium: 9.6 mg/dL (ref 8.9–10.3)
Chloride: 100 mmol/L (ref 98–111)
Creatinine, Ser: 0.73 mg/dL (ref 0.44–1.00)
GFR, Estimated: >60 mL/min (ref >60)
Glucose, Bld: 156 mg/dL — ABNORMAL HIGH (ref 70–99)
Potassium: 3.1 mmol/L — ABNORMAL LOW (ref 3.5–5.1)
Sodium: 135 mmol/L (ref 135–145)
Total Bilirubin: 0.6 mg/dL (ref 0.3–1.2)
Total Protein: 7.8 g/dL (ref 6.5–8.1)

## 2022-12-24 LAB — LIPASE, BLOOD: Lipase: 28 U/L (ref 11–51)

## 2022-12-24 LAB — MAGNESIUM: Magnesium: 2 mg/dL (ref 1.7–2.4)

## 2022-12-24 MED ORDER — LORAZEPAM 2 MG/ML IJ SOLN
1.0000 mg | Freq: Once | INTRAMUSCULAR | Status: AC
  Administered 2022-12-25: 1 mg via INTRAVENOUS
  Filled 2022-12-24: qty 1

## 2022-12-24 MED ORDER — SODIUM CHLORIDE 0.9 % IV BOLUS
1000.0000 mL | Freq: Once | INTRAVENOUS | Status: AC
  Administered 2022-12-25: 1000 mL via INTRAVENOUS

## 2022-12-24 MED ORDER — PANTOPRAZOLE SODIUM 40 MG IV SOLR
40.0000 mg | Freq: Once | INTRAVENOUS | Status: AC
  Administered 2022-12-25: 40 mg via INTRAVENOUS
  Filled 2022-12-24: qty 10

## 2022-12-24 MED ORDER — ONDANSETRON HCL 4 MG/2ML IJ SOLN
4.0000 mg | Freq: Once | INTRAMUSCULAR | Status: AC
  Administered 2022-12-25: 4 mg via INTRAVENOUS
  Filled 2022-12-24: qty 2

## 2022-12-24 MED ORDER — MAGNESIUM SULFATE 2 GM/50ML IV SOLN
2.0000 g | Freq: Once | INTRAVENOUS | Status: AC
  Administered 2022-12-25: 2 g via INTRAVENOUS
  Filled 2022-12-24: qty 50

## 2022-12-24 NOTE — ED Triage Notes (Signed)
Pt BIB EMS c/o vomiting and abd pain.

## 2022-12-24 NOTE — ED Provider Triage Note (Signed)
Emergency Medicine Provider Triage Evaluation Note  Leslea Vowles , a 62 y.o. female  was evaluated in triage.  Pt complains of epigastric abdominal pain.  Symptoms began this morning.  States it feels the same as when she was admitted 1 week ago.  She is a current pack per day smoker.  Also smokes marijuana daily.  Denies alcohol use or abdominal surgeries.  Pain does not radiate from her epigastric region.  Has severe nausea and states that she has vomited everything up.  Denies chest pain or shortness of breath or diarrhea  Review of Systems  Positive: As above Negative: As above  Physical Exam  BP (!) 163/102 (BP Location: Right Arm)   Pulse (!) 105   Temp 99.5 F (37.5 C) (Oral)   Resp 20   Ht 5\' 3"  (1.6 m)   Wt 73 kg   SpO2 100%   BMI 28.51 kg/m  Gen:   Awake, significant distress   Resp:  Normal effort  MSK:   Moves extremities without difficulty  Other:    Medical Decision Making  Medically screening exam initiated at 7:01 PM.  Appropriate orders placed.  Sacheen Arrasmith was informed that the remainder of the evaluation will be completed by another provider, this initial triage assessment does not replace that evaluation, and the importance of remaining in the ED until their evaluation is complete.  Abdominal pain labs.  Patient has history of prolonged QT.  Will get EKG prior to antiemetics   Roylene Reason, Hershal Coria 12/24/22 1903

## 2022-12-25 ENCOUNTER — Telehealth: Payer: Self-pay

## 2022-12-25 DIAGNOSIS — R109 Unspecified abdominal pain: Secondary | ICD-10-CM | POA: Diagnosis not present

## 2022-12-25 DIAGNOSIS — R1110 Vomiting, unspecified: Secondary | ICD-10-CM | POA: Diagnosis not present

## 2022-12-25 LAB — BLOOD GAS, VENOUS
Acid-Base Excess: 1.6 mmol/L (ref 0.0–2.0)
Bicarbonate: 22.4 mmol/L (ref 20.0–28.0)
O2 Saturation: 73.6 %
Patient temperature: 37
pCO2, Ven: 25 mmHg — ABNORMAL LOW (ref 44–60)
pH, Ven: 7.56 — ABNORMAL HIGH (ref 7.25–7.43)
pO2, Ven: 38 mmHg (ref 32–45)

## 2022-12-25 LAB — BASIC METABOLIC PANEL
Anion gap: 9 (ref 5–15)
BUN: 8 mg/dL (ref 8–23)
CO2: 21 mmol/L — ABNORMAL LOW (ref 22–32)
Calcium: 7.9 mg/dL — ABNORMAL LOW (ref 8.9–10.3)
Chloride: 102 mmol/L (ref 98–111)
Creatinine, Ser: 0.55 mg/dL (ref 0.44–1.00)
GFR, Estimated: >60 mL/min (ref >60)
Glucose, Bld: 108 mg/dL — ABNORMAL HIGH (ref 70–99)
Potassium: 2.7 mmol/L — CL (ref 3.5–5.1)
Sodium: 132 mmol/L — ABNORMAL LOW (ref 135–145)

## 2022-12-25 MED ORDER — POTASSIUM CHLORIDE CRYS ER 20 MEQ PO TBCR
40.0000 meq | EXTENDED_RELEASE_TABLET | Freq: Once | ORAL | Status: AC
  Administered 2022-12-25: 40 meq via ORAL
  Filled 2022-12-25: qty 2

## 2022-12-25 MED ORDER — POTASSIUM CHLORIDE 10 MEQ/100ML IV SOLN
10.0000 meq | INTRAVENOUS | Status: AC
  Administered 2022-12-25 (×3): 10 meq via INTRAVENOUS
  Filled 2022-12-25 (×3): qty 100

## 2022-12-25 MED ORDER — HYDROMORPHONE HCL 1 MG/ML IJ SOLN
1.0000 mg | Freq: Once | INTRAMUSCULAR | Status: AC
  Administered 2022-12-25: 1 mg via INTRAVENOUS
  Filled 2022-12-25: qty 1

## 2022-12-25 MED ORDER — HYDROMORPHONE HCL 1 MG/ML IJ SOLN
1.0000 mg | Freq: Once | INTRAMUSCULAR | Status: DC
  Filled 2022-12-25: qty 1

## 2022-12-25 MED ORDER — POTASSIUM CHLORIDE ER 20 MEQ PO TBCR: 20.0000 meq | EXTENDED_RELEASE_TABLET | Freq: Every day | ORAL | 0 refills | Status: DC

## 2022-12-25 MED ORDER — SODIUM CHLORIDE 0.9 % IV BOLUS
1000.0000 mL | Freq: Once | INTRAVENOUS | Status: AC
  Administered 2022-12-25: 1000 mL via INTRAVENOUS

## 2022-12-25 MED ORDER — ONDANSETRON HCL 4 MG PO TABS: 4.0000 mg | ORAL_TABLET | Freq: Four times a day (QID) | ORAL | 0 refills | Status: DC

## 2022-12-25 NOTE — Patient Instructions (Signed)
Visit Information  Thank you for taking time to visit with me today. Please don't hesitate to contact me if I can be of assistance to you.   Following are the goals we discussed today:   Goals Addressed             This Visit's Progress    Care Coordination Activities- self management of GI issues       Care Coordination Interventions: Evaluation of current treatment plan related to nausea and vomiting and patient's adherence to plan as established by provider Advised patient to stop using cannabis and discussed how cannabis can cause vomiting Provided education to patient re: care coordination services Assessed social determinant of health barriers Discussed advancing diet slowly and reviewed the BRAT diet.  Dicussed drinking adequate amounts of fluids          Our next appointment is by telephone on 12/30/22 at 2 PM  Please call the care guide team at [phone] if you need to cancel or reschedule your appointment.   If you are experiencing a Mental Health or [place] or need someone to talk to, please call the Suicide and Crisis Lifeline: 988 call the [place] National Suicide Prevention Lifeline: (703)286-3000 or TTY: 586-118-7571 TTY 856-498-8138) to talk to a trained counselor call 1-800-273-TALK (toll free, 24 hour hotline) go to [facility] [address] [place] [phone]) call 911   Patient verbalizes understanding of instructions and care plan provided today and agrees to view in [place]. Active MyChart status and patient understanding of how to access instructions and care plan via MyChart confirmed with patient.     Telephone follow up appointment with care management team member scheduled for: 12/30/22  Peter Garter RN, Jackquline [place], [place] [facility] [phone]

## 2022-12-25 NOTE — Discharge Instructions (Addendum)
Recommend a bland diet, continue with your acid pills, have given you Zofran to use as needed for nausea.  Please limit your cannabis use as it is likely the cause of your vomiting.  I have also given you acid pills please take as prescribed.  Follow-up with your primary care doctor and/or GI doctor for further evaluation as I would like them to recheck your potassium levels.  Come back to the emergency department if you develop chest pain, shortness of breath, severe abdominal pain, uncontrolled nausea, vomiting, diarrhea.

## 2022-12-25 NOTE — ED Provider Notes (Signed)
[place] EMERGENCY DEPARTMENT AT [facility] Provider Note   CSN: [id number] Arrival date & time: 12/24/22  1846     History  Chief Complaint  Patient presents with   Abdominal Pain    Katie Tokarski is a 62 y.o. female.  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  Patient states that symptoms started yesterday around 10 AM, states she woke up and had persistent vomiting, states she is unable to tolerate any p.o., she denies any coffee emesis or bloody emesis, states that she still passing gas having normal bowel movements, she denies any urinary symptoms.  States her abdominal pain is in her epigastric region does not radiate, states it feels like her typical cyclic vomiting symptoms.  She has no chest pain no shortness of breath she denies any fevers chills cough congestion.  She states that she smoked marijuana the night prior, she denies any alcohol use NSAID use.    Home Medications Prior to Admission medications   Medication Sig Start Date End Date Taking? Authorizing Provider  amLODipine (NORVASC) 5 MG tablet Take 5 mg by mouth daily. 03/29/22  Yes [provider]  celecoxib (CELEBREX) 200 MG capsule Take 200 mg by mouth daily. 05/23/22  Yes [provider]  cholecalciferol (VITAMIN D3) 25 MCG (1000 UT) tablet Take 2,000 Units by mouth daily.   Yes [provider]  esomeprazole (NEXIUM) 40 MG capsule Take 40 mg by mouth daily. 03/04/18  Yes [provider]  fluticasone (FLONASE) 50 MCG/ACT nasal spray Place 2 sprays into both nostrils daily as needed for allergies.   Yes [provider]  levocetirizine (XYZAL) 5 MG tablet Take 5 mg by mouth every evening.   Yes [provider]  LORazepam (ATIVAN) 1 MG tablet Take 1 mg by mouth at bedtime. 04/09/18  Yes  [provider]  magnesium hydroxide (MILK OF MAGNESIA) 400 MG/5ML suspension Take 30 mLs by mouth daily as needed for mild constipation.   Yes [provider]  metoprolol succinate (TOPROL-XL) 25 MG 24 hr tablet Take 25 mg by mouth daily. 04/12/22  Yes [provider]  montelukast (SINGULAIR) 10 MG tablet TAKE ONE TABLET AT BEDTIME. 04/22/14  Yes Wardell Honour, MD  ondansetron (ZOFRAN) 4 MG tablet Take 1 tablet (4 mg total) by mouth every 6 (six) hours. 12/25/22  Yes Marcello Fennel, PA-C  potassium chloride 20 MEQ TBCR Take 1 tablet (20 mEq total) by mouth daily for 5 days. 12/25/22 12/30/22 Yes Marcello Fennel, PA-C  rosuvastatin (CRESTOR) 10 MG tablet Take 10 mg by mouth as directed. Take Mon-Fri Only. 03/29/22  Yes [provider]  sodium chloride 1 g tablet Take 1 g by mouth 2 (two) times daily with a meal.   Yes [provider]  valsartan (DIOVAN) 80 MG tablet Take 80 mg by mouth at bedtime. 03/07/22  Yes [provider]  venlafaxine (EFFEXOR) 37.5 MG tablet Take 112.5 mg by mouth daily. 07/31/22  Yes [provider]  albuterol (PROAIR HFA) 108 (90 BASE) MCG/ACT inhaler Inhale 2 puffs into the lungs every 6 (six) hours as needed for wheezing. 03/11/13   Rise Mu, PA-C  Capsaicin-Menthol-Methyl Sal (CAPSAICIN-METHYL SAL-MENTHOL) 0.025-1-12 % CREA Apply 1 Application topically 2 (two) times daily as needed. Patient not taking: Reported on 12/25/2022 08/30/22   Wynona Dove A, DO  metoCLOPramide (  REGLAN) 5 MG tablet Take 1 tablet (5 mg total) by mouth every 6 (six) hours as needed for nausea. Patient not taking: Reported on 12/25/2022 08/30/22   Tanda Rockers A, DO  ondansetron (ZOFRAN-ODT) 4 MG disintegrating tablet Take 1 tablet (4 mg total) by mouth every 8 (eight) hours as needed for nausea or vomiting. Patient not taking: Reported on 12/15/2022 08/28/22   Elson Areas, PA-C      Allergies    Avelox [moxifloxacin hcl in nacl]  and Haldol [haloperidol lactate]    Review of Systems   Review of Systems  Constitutional:  Negative for chills and fever.  Respiratory:  Negative for shortness of breath.   Cardiovascular:  Negative for chest pain.  Gastrointestinal:  Positive for abdominal pain, nausea and vomiting. Negative for diarrhea.  Neurological:  Negative for headaches.    Physical Exam Updated Vital Signs BP 119/80   Pulse 93   Temp 98.9 F (37.2 C) (Oral)   Resp 18   Ht 5\' 3"  (1.6 m)   Wt 73 kg   SpO2 100%   BMI 28.51 kg/m  Physical Exam Vitals and nursing note reviewed.  Constitutional:      General: She is in acute distress.     Appearance: She is not ill-appearing.  HENT:     Head: Normocephalic and atraumatic.     Nose: No congestion.  Eyes:     Conjunctiva/sclera: Conjunctivae normal.  Cardiovascular:     Rate and Rhythm: Regular rhythm. Tachycardia present.     Pulses: Normal pulses.     Heart sounds: No murmur heard.    No friction rub. No gallop.  Pulmonary:     Effort: No respiratory distress.     Breath sounds: No wheezing, rhonchi or rales.  Abdominal:     Palpations: Abdomen is soft.     Tenderness: There is abdominal tenderness. There is no right CVA tenderness or left CVA tenderness.     Comments: Abdomen nondistended, soft, she has noted tenderness in the epigastric region without guarding rebound tenderness or peritoneal sign negative Murphy sign McBurney point.  Skin:    General: Skin is warm and dry.  Neurological:     Mental Status: She is alert.  Psychiatric:        Mood and Affect: Mood normal.     ED Results / Procedures / Treatments   Labs (all labs ordered are listed, but only abnormal results are displayed) Labs Reviewed  CBC WITH DIFFERENTIAL/PLATELET - Abnormal; Notable for the following components:      Result Value   WBC 11.4 (*)    HCT 35.7 (*)    Platelets 616 (*)    Neutro Abs 9.5 (*)    All other components within normal limits   COMPREHENSIVE METABOLIC PANEL - Abnormal; Notable for the following components:   Potassium 3.1 (*)    CO2 18 (*)    Glucose, Bld 156 (*)    Anion gap 17 (*)    All other components within normal limits  BLOOD GAS, VENOUS - Abnormal; Notable for the following components:   pH, Ven 7.56 (*)    pCO2, Ven 25 (*)    All other components within normal limits  BASIC METABOLIC PANEL - Abnormal; Notable for the following components:   Sodium 132 (*)    Potassium 2.7 (*)    CO2 21 (*)    Glucose, Bld 108 (*)    Calcium 7.9 (*)    All other  components within normal limits  LIPASE, BLOOD  MAGNESIUM  URINALYSIS, ROUTINE W REFLEX MICROSCOPIC    EKG None  Radiology DG Abdomen 1 View  Result Date: 12/25/2022 CLINICAL DATA:  Abdominal pain, vomiting EXAM: ABDOMEN - 1 VIEW COMPARISON:  04/15/2022 FINDINGS: Nonobstructive bowel gas pattern. No free air or suspicious calcification. Liver appears prominent. Lung bases clear. Postoperative changes in the lower lumbar spine. No acute bony abnormality. IMPRESSION: Question hepatomegaly.  Recommend clinical correlation. No bowel obstruction or free air. Electronically Signed   By: Rolm Baptise M.D.   On: 12/25/2022 00:07    Procedures Procedures    Medications Ordered in ED Medications  HYDROmorphone (DILAUDID) injection 1 mg (1 mg Intravenous Not Given 12/25/22 0120)  potassium chloride 10 mEq in 100 mL IVPB (10 mEq Intravenous New Bag/Given 12/25/22 0548)  LORazepam (ATIVAN) injection 1 mg (1 mg Intravenous Given 12/25/22 0006)  ondansetron (ZOFRAN) injection 4 mg (4 mg Intravenous Given 12/25/22 0006)  sodium chloride 0.9 % bolus 1,000 mL (0 mLs Intravenous Stopped 12/25/22 0117)  pantoprazole (PROTONIX) injection 40 mg (40 mg Intravenous Given 12/25/22 0006)  magnesium sulfate IVPB 2 g 50 mL (0 g Intravenous Stopped 12/25/22 0116)  sodium chloride 0.9 % bolus 1,000 mL (1,000 mLs Intravenous Bolus 12/25/22 0117)  potassium chloride SA (KLOR-CON M) CR tablet  40 mEq (40 mEq Oral Given 12/25/22 0120)  HYDROmorphone (DILAUDID) injection 1 mg (1 mg Intravenous Given 12/25/22 0128)    ED Course/ Medical Decision Making/ A&P                             Medical Decision Making Amount and/or Complexity of Data Reviewed Labs: ordered. Radiology: ordered.  Risk Prescription drug management.   This patient presents to the ED for concern of abdominal pain, this involves an extensive number of treatment options, and is a complaint that carries with it a high risk of complications and morbidity.  The differential diagnosis includes bowel obstruction, volvulus, pancreatitis, diverticulitis,    Additional history obtained:  Additional history obtained from N/A External records from outside source obtained and reviewed including recent hospitalization   Co morbidities that complicate the patient evaluation  Cyclic vomiting, cannabis use  Social Determinants of Health:  N/A    Lab Tests:  I Ordered, and personally interpreted labs.  The pertinent results include: CBC shows leukocytosis of 11.4, CMP shows potassium 3.1, CO2 of 18, glucose 156, anion gap of 17 lipase is 28, mag 2.0, VBG shows pH of 7.5   Imaging Studies ordered:  I ordered imaging studies including plain film abdomen I independently visualized and interpreted imaging which showed negative for signs of obstruction I agree with the radiologist interpretation   Cardiac Monitoring:  The patient was maintained on a cardiac monitor.  I personally viewed and interpreted the cardiac monitored which showed an underlying rhythm of: Sinus tach without signs of ischemia   Medicines ordered and prescription drug management:  I ordered medication including fluids, antiemetics, I have reviewed the patients home medicines and have made adjustments as needed  Critical Interventions:  N/A   Reevaluation:  Presents with abdominal pain nausea vomiting, lab work was obtained by  triage, pressure reviewed, notable for leukocytosis of 11.4, CMP showing decreased potassium CO2 and anion gap, I suspect this is all secondary due to dehydration from vomiting but can actually possibly DKA, will add on VBG further evaluation  Patient was reassessed after Ativan and Zofran,  states her nausea has not resolved but states she still having abdominal pain, will provide her with a dose of Dilaudid, add on additional liter fluids reassess  Resting comfortably, will repeat BMP to assess CO2 and anion gap.  Repeat BMP reveals gap has closed, CO2 has increased to 21, patient has a low potassium 2.7, will provide with IV potassium, her abdomen is soft nontender she is tolerating p.o.  Will continue to monitor.  Reassessed resting comfortably, tolerating p.o., agreement discharge at this time.    Consultations Obtained:  N/a    Test Considered:  CT AP-deferred as my suspicion for intra-abdominal abnormality is low she has nonsurgical abdomen lab work is reassuring.    Rule out Suspicion for sepsis is low at this time as patient is nontoxic-appearing, she is afebrile, no source of infection seen on exam or within lab work.  She was tachycardic and has slight leukocytosis on arrival but I suspect this is multifactorial,  acute phase reaction from vomiting as well as dehydration, this is improved after fluid resuscitation.  I doubt DKA or HSS she does not have significantly elevated glucose level, no decrease in [place], her anion gap as well as her CO2 improved with fluid resuscitation. I have low suspicion for liver or gallbladder abnormality as she has no right upper quadrant tenderness, liver enzymes, alk phos, T bili all within normal limits.  Low suspicion for pancreatitis as lipase is within normal limits.  Low suspicion for ruptured stomach ulcer as she has no peritoneal sign present on exam .  Low suspicion for bowel obstruction as abdomen is nondistended normal bowel sounds, so  passing gas and having normal bowel movements x-ray is unremarkable.  Suspicion for AAA or dissection is also low at this time the patient has no history of this, recently had dissection study which was negative for evidence of aneurysms, presentation seems more consistent with her cyclic vomiting as her symptoms improved after pain medication.       Dispostion and problem list  After consideration of the diagnostic results and the patients response to treatment, I feel that the patent would benefit from discharge.  Abdominal pain nausea vomiting-likely this is secondary due to cyclic vomiting from cannabis use, will encourage her  to discontinue cannabis, will discharge home with antiemetics, have her continue with PPIs, and follow-up with her GI doctor for further evaluation Hypokalemia-from vomiting, will provide with a short course of supplemental potassium follow-up with her PCP.            Final Clinical Impression(s) / ED Diagnoses Final diagnoses:  Nausea and vomiting, unspecified vomiting type  Hypokalemia    Rx / DC Orders ED Discharge Orders          Ordered    ondansetron (ZOFRAN) 4 MG tablet  Every 6 hours        12/25/22 0618    potassium chloride 20 MEQ TBCR  Daily        12/25/22 0619              Marcello Fennel, PA-C 12/25/22 3267    Quintella Reichert, MD 12/25/22 [phone]

## 2022-12-25 NOTE — Patient Outreach (Signed)
  Care Coordination   12/25/2022 Name: Katie Chang MRN: [id number] DOB: 02-08-1961   Care Coordination Outreach Attempts:  A third unsuccessful outreach was attempted today to offer the patient with information about available care coordination services as a benefit of their health plan.   Follow Up Plan:  No further outreach attempts will be made at this time. We have been unable to contact the patient to offer or enroll patient in care coordination services  Encounter Outcome:  No Answer   Care Coordination Interventions:  No, not indicated    Peter Garter RN, BSN,CCM, [place] [facility] [phone]

## 2022-12-25 NOTE — ED Notes (Signed)
Pt able to take in ice chips and sips of water. Tolerating well.

## 2022-12-25 NOTE — Patient Outreach (Signed)
  Care Coordination   Initial Visit Note   12/25/2022 Name: Katie Chang MRN: [id number] DOB: [birth year]  Katie Chang is a 62 y.o. year old female who sees [place], [place], [place] for primary care. I spoke with  [name] by phone today.  What matters to the patients health and wellness today?  I was in the ED for nausea and vomiting again.   States they told her to stop smoking cannabis which could be causing her vomiting.  States she saw her PCP last week and she has a referral to see a GI doctor.  States she has been drinking a lot of fluids and are some apple sause.      Goals Addressed             This Visit's Progress    Care Coordination Activities- self management of GI issues       Care Coordination Interventions: Evaluation of current treatment plan related to nausea and vomiting and patient's adherence to plan as established by provider Advised patient to stop using cannabis and discussed how cannabis can cause vomiting Provided education to patient re: care coordination services Assessed social determinant of health barriers Discussed advancing diet slowly and reviewed the BRAT diet.  Dicussed drinking adequate amounts of fluids          SDOH assessments and interventions completed:  Yes  SDOH Interventions Today    Flowsheet Row Most Recent Value  SDOH Interventions   Food Insecurity Interventions Intervention Not Indicated  Housing Interventions Intervention Not Indicated  Transportation Interventions Intervention Not Indicated  Utilities Interventions Intervention Not Indicated        Care Coordination Interventions:  Yes, provided  Interventions Today    Flowsheet Row Most Recent Value  General Interventions   General Interventions Discussed/Reviewed Doctor Visits  Doctor Visits Discussed/Reviewed PCP  PCP/Specialist Visits Compliance with follow-up visit  Education Interventions   Education Provided Provided Verbal Education  Provided Verbal  Education On Nutrition  [BRAT diet]  Mental Health Interventions   Mental Health Discussed/Reviewed Coping Strategies  Nutrition Interventions   Nutrition Discussed/Reviewed Fluid intake       Follow up plan: Follow up call scheduled for 12/30/22    Encounter Outcome:  Pt. Visit Completed  Peter Garter RN, [facility], [place] Management [phone]

## 2022-12-30 ENCOUNTER — Ambulatory Visit: Payer: Self-pay

## 2022-12-30 NOTE — Patient Instructions (Signed)
Visit Information  Thank you for taking time to visit with me today. Please don't hesitate to contact me if I can be of assistance to you.   Following are the goals we discussed today:   Goals Addressed             This Visit's Progress    COMPLETED: Care Coordination Activities- self management of GI issues       Care Coordination Interventions: Evaluation of current treatment plan related to nausea and vomiting and patient's adherence to plan as established by provider Advised patient to try stopping using cannabis and reinforced how cannabis can cause vomiting Provided education to patient re: ways to help control anxiety and discussed LCSW referral Social Work referral for assistance in finding ways to cope with anxiety  Reinforced advancing diet slowly .  Reinforced drinking adequate amounts of fluids Interventions Today    Flowsheet Row Most Recent Value  Chronic Disease Discussed/Reviewed   Chronic disease discussed/reviewed during today's visit Other  [Nausea.vomiting]  General Interventions   General Interventions Discussed/Reviewed General Interventions Discussed  Education Interventions   Education Provided Provided Verbal Education  Provided Verbal Education On Nutrition, Mental Health/Coping with Illness  Mental Health Interventions   Mental Health Discussed/Reviewed Mental Health Discussed, Coping Strategies, Refer to Social Work for counseling  Refer to Social Work for counseling regarding Anxiety/Coping  Nutrition Interventions   Nutrition Discussed/Reviewed Nutrition Reviewed  [bland diet]       No further RNCM needs at this time        Your next appointment is by telephone on 12/31/22 at 3 PM  Please call the care guide team at [phone] if you need to cancel or reschedule your appointment.   If you are experiencing a Mental Health or [place] or need someone to talk to, please call the Suicide and Crisis Lifeline: 988 call the [place]  National Suicide Prevention Lifeline: (437) 281-7366 or TTY: 318-098-0764 TTY 609-276-5783) to talk to a trained counselor call 1-800-273-TALK (toll free, 24 hour hotline) go to [facility] [address], [place] [phone]) call 911   Patient verbalizes understanding of instructions and care plan provided today and agrees to view in [place]. Active MyChart status and patient understanding of how to access instructions and care plan via MyChart confirmed with patient.     Telephone follow up appointment with care management team member scheduled for: 12/31/22  Peter Garter RN, Jackquline [place], [place] Management [phone]

## 2022-12-30 NOTE — Patient Outreach (Signed)
  Care Coordination   Follow Up Visit Note   12/30/2022 Name: Rushie Brazel MRN: [id number] DOB: 24-Apr-1961  Mertis Mosher is a 62 y.o. year old female who sees [place], [place], [place] for primary care. I spoke with  [name] by phone today.  What matters to the patients health and wellness today?  States she is feeling a lot better. States she is eating better and drinking plenty of fluids.  States she is trying to eat bland foods mostly.  States she has had occasional nausea but no vomiting.  States she has not totally cut out using cannabis as this is how she controls her anxiety.  States she would like to have less anxiety and find ways of dealing with her anxiety.  States she saw therapist in the past and she is open to speaking with someone about her anxiety.    Goals Addressed             This Visit's Progress    COMPLETED: Care Coordination Activities- self management of GI issues       Care Coordination Interventions: Evaluation of current treatment plan related to nausea and vomiting and patient's adherence to plan as established by provider Advised patient to try stopping using cannabis and reinforced how cannabis can cause vomiting Provided education to patient re: ways to help control anxiety and discussed LCSW referral Social Work referral for assistance in finding ways to cope with anxiety  Reinforced advancing diet slowly .  Reinforced drinking adequate amounts of fluids Interventions Today    Flowsheet Row Most Recent Value  Chronic Disease Discussed/Reviewed   Chronic disease discussed/reviewed during today's visit Other  [Nausea.vomiting]  General Interventions   General Interventions Discussed/Reviewed General Interventions Discussed  Education Interventions   Education Provided Provided Verbal Education  Provided Verbal Education On Nutrition, Mental Health/Coping with Illness  Mental Health Interventions   Mental Health Discussed/Reviewed Mental Health  Discussed, Coping Strategies, Refer to Social Work for counseling  Refer to Social Work for counseling regarding Anxiety/Coping  Nutrition Interventions   Nutrition Discussed/Reviewed Nutrition Reviewed  [bland diet]       No further RNCM needs at this time         SDOH assessments and interventions completed:  No     Care Coordination Interventions:  Yes, provided  Interventions Today    Flowsheet Row Most Recent Value  Chronic Disease Discussed/Reviewed   Chronic disease discussed/reviewed during today's visit Other  [Nausea.vomiting]  General Interventions   General Interventions Discussed/Reviewed General Interventions Discussed  Education Interventions   Education Provided Provided Verbal Education  Provided Verbal Education On Nutrition, Mental Health/Coping with Illness  Mental Health Interventions   Mental Health Discussed/Reviewed Mental Health Discussed, Coping Strategies, Refer to Social Work for counseling  Refer to Social Work for counseling regarding Anxiety/Coping  Nutrition Interventions   Nutrition Discussed/Reviewed Nutrition Reviewed  [bland diet]       Follow up plan: Follow up call scheduled for 12/31/22 with LCSW    Encounter Outcome:  Pt. Visit Completed

## 2022-12-31 ENCOUNTER — Ambulatory Visit: Payer: Self-pay | Admitting: Licensed Clinical Social Worker

## 2022-12-31 NOTE — Patient Outreach (Signed)
  Care Coordination  Initial Visit Note   12/31/2022 Name: Katie Chang MRN: [id number] DOB: [birth year]  Katie Chang is a 62 y.o. year old female who sees Katie Chang, [place], [place] for primary care. I spoke with  Katie Chang by phone today.  What matters to the patients health and wellness today?    Patient is experiencing symptoms of  anxiety which seems to be exacerbated by life transitions   (father getting older, no longer working, death of friends. etc. She is self medicating to assist with decreasing anxiety symptoms Recommendation: Patient may benefit from, and is in agreement to start therapy so that she no longer has to use cannabis to manage her emotions . Marland Kitchen    Goals Addressed             This Visit's Progress    Connect with therapist to reduce symptoms of anxiety       Activities and task to complete in order to accomplish goals.   [facility] [phone] to schedule your counseling appointment. Relaxed breathing 3 times daily Continue with compliance of taking medication prescribed by Doctor Review your EMMI educational information (Breathing to Relax & Sleep Strategies For People Who Are Stressed or Anxious) Look for an e-mail from [employer].         SDOH assessments and interventions completed:  Yes  SDOH Interventions Today    Flowsheet Row Most Recent Value  SDOH Interventions   Stress Interventions Provide Counseling       Care Coordination Interventions:  Yes, provided  Interventions Today    Flowsheet Row Most Recent Value  Chronic Disease   Chronic disease during today's visit Other  [Depression, Anxiety]  Education Interventions   Education Provided Provided Education, Provided Web-based Education  Provided Verbal Education On Mental Health/Coping with Illness  Mental Health Interventions   Mental Health Discussed/Reviewed Mental Health Discussed, Anxiety  [connect for ongoing therapy]  Refer to Social Work for  counseling regarding Anxiety/Coping       Follow up plan: Follow up call scheduled for 01/07/23    Encounter Outcome:  Pt. Visit Completed   Katie Chang, [place] [phone]

## 2022-12-31 NOTE — Patient Instructions (Signed)
Visit Information  Thank you for taking time to visit with me today. Please don't hesitate to contact me if I can be of assistance to you.   Following are the goals we discussed today:   Goals Addressed             This Visit's Progress    Connect with therapist to reduce symptoms of anxiety       Activities and task to complete in order to accomplish goals.   [facility] [phone] to schedule your counseling appointment. Relaxed breathing 3 times daily Continue with compliance of taking medication prescribed by Doctor Review your EMMI educational information (Breathing to Relax & Sleep Strategies For People Who Are Stressed or Anxious) Look for an e-mail from [employer].         Our next appointment is by telephone on 01/07/23 at 3:00  Please call the care guide team at [phone] if you need to cancel or reschedule your appointment.   If you are experiencing a Mental Health or [place] or need someone to talk to, please call the Suicide and Crisis Lifeline: 988 call the [place] National Suicide Prevention Lifeline: 2172872203 or TTY: (813)239-3734 TTY 502-215-5672) to talk to a trained counselor call 1-800-273-TALK (toll free, 24 hour hotline) go to [facility] [address] [place] [phone])   Patient verbalizes understanding of instructions and care plan provided today and agrees to view in [place]. Active MyChart status and patient understanding of how to access instructions and care plan via MyChart confirmed with patient.     Casimer Lanius, [place] [phone]

## 2023-01-01 ENCOUNTER — Telehealth: Payer: Self-pay

## 2023-01-01 NOTE — Patient Outreach (Signed)
  Care Coordination   Multidisciplinary Case Review Note    01/01/2023 Name: Arshiya Jakes MRN: [id number] DOB: May 18, 1961  Moriya Mitchell is a 62 y.o. year old female who sees [place], [place], [place] for primary care.  The  multidisciplinary care team met today to review patient care needs and barriers.     Goals Addressed             This Visit's Progress    COMPLETED: Care Coordination Activities       Care Coordination Interventions: Discussed at multidisciplinary case review LCSW continues to follow          SDOH assessments and interventions completed:  No     Care Coordination Interventions Activated:  Yes   Care Coordination Interventions:  Yes, provided   Follow up plan: Follow up call scheduled for LCSW    Multidisciplinary Team Attendees:   Peter Garter RN, [facility], CDE Casimer Lanius LCSW Kandra Nicolas [place]ance RN Thea Silversmith Rn Richarda Osmond Tousey RN Felecia McCray RN Scott Forrest LCSW Scribe for Multidisciplinary Case Review:  Peter Garter RN, Jackquline Denmark, [place] Management Coordinator [place] Management [phone]

## 2023-01-07 ENCOUNTER — Ambulatory Visit: Payer: Self-pay | Admitting: Licensed Clinical Social Worker

## 2023-01-07 NOTE — Patient Outreach (Signed)
  Care Coordination  Follow Up Visit Note   01/07/2023 Name: Zakiah Poorman MRN: [id number] DOB: 1961/08/08  Seraiah Cook is a 62 y.o. year old female who sees [place], [place], [place] for primary care. I spoke with  [name] by phone today.  What matters to the patients health and wellness today?    Patient continues to experience difficulty with reconnecting to previous therapist. Discussed other options and provided contact information. She did not have a chance to review EMMI video, will review prior to next encounter . Recommendation: Patient may benefit from, and is in agreement to to follow up with other options discussed today. .    Goals Addressed             This Visit's Progress    Connect with therapist to reduce symptoms of anxiety       Activities and task to complete in order to accomplish goals.   Call [facility] [phone] or Jeremy Johann [phone] to schedule your counseling appointment. Start / continue relaxed breathing 3 times daily Continue with compliance of taking medication prescribed by Doctor  Self-care   (work on the list we discussed for your jar to prepare for you stop date) Review your EMMI educational information (Breathing to Relax & Sleep Strategies For People Who Are Stressed or Anxious) Look for an e-mail from [employer].         SDOH assessments and interventions completed:  No   Care Coordination Interventions:  Yes, provided  Interventions Today    Flowsheet Row Most Recent Value  Chronic Disease   Chronic disease during today's visit Other  [Anxiety]  Education Interventions   Education Provided Provided Education  Mental Health Interventions   Mental Health Discussed/Reviewed Mental Health Discussed, Anxiety, Substance Abuse  Refer to Social Work for counseling regarding Anxiety/Coping       Follow up plan: Follow up call scheduled for 1 week    Encounter Outcome:  Pt. Visit Completed   Casimer Lanius,  [place] [phone]

## 2023-01-07 NOTE — Patient Instructions (Signed)
Visit Information  Thank you for taking time to visit with me today. Please don't hesitate to contact me if I can be of assistance to you.   Following are the goals we discussed today:   Goals Addressed             This Visit's Progress    Connect with therapist to reduce symptoms of anxiety       Activities and task to complete in order to accomplish goals.   Call [facility] [phone] or Jeremy Johann [phone] to schedule your counseling appointment. Start / continue relaxed breathing 3 times daily Continue with compliance of taking medication prescribed by Doctor  Self-care   (work on the list we discussed for your jar to prepare for you stop date) Review your EMMI educational information (Breathing to Relax & Sleep Strategies For People Who Are Stressed or Anxious) Look for an e-mail from [employer].         Our next appointment is by telephone on 01/14/23 at 3:00  Please call the care guide team at [phone] if you need to cancel or reschedule your appointment.   If you are experiencing a Mental Health or [place] or need someone to talk to, please call the Suicide and Crisis Lifeline: 988 call the [place] National Suicide Prevention Lifeline: 906-336-3882 or TTY: 812-502-3688 TTY (380) 230-9851) to talk to a trained counselor call 1-800-273-TALK (toll free, 24 hour hotline) go to [facility] [address], [place] [phone])   Patient verbalizes understanding of instructions and care plan provided today and agrees to view in [place]. Active MyChart status and patient understanding of how to access instructions and care plan via MyChart confirmed with patient.     Casimer Lanius, [place] [phone]

## 2023-01-14 ENCOUNTER — Ambulatory Visit: Payer: Self-pay | Admitting: Licensed Clinical Social Worker

## 2023-01-14 DIAGNOSIS — F419 Anxiety disorder, unspecified: Secondary | ICD-10-CM

## 2023-01-14 DIAGNOSIS — R4589 Other symptoms and signs involving emotional state: Secondary | ICD-10-CM

## 2023-01-14 NOTE — Patient Outreach (Signed)
  Care Coordination  Follow Up Visit Note   01/14/2023 Name: Katie Chang MRN: [id number] DOB: 1961/06/20  Katie Chang is a 62 y.o. year old female who sees [place], [place], [place] for primary care. I spoke with  [name] by phone today.  What matters to the patients health and wellness today?  Has not been able to connect with previous therapist.  Unable to complete call today and requested to reschedule.    Goals Addressed             This Visit's Progress    Connect with therapist to reduce symptoms of anxiety       Activities and task to complete in order to accomplish goals.   Start / continue relaxed breathing 3 times daily Continue with compliance of taking medication prescribed by Doctor  Self Support options  (work on the list we discussed for your jar to prepare for you stop date) Review your EMMI educational information (Breathing to Relax & Sleep Strategies For People Who Are Stressed or Anxious) Look for an e-mail from [employer]. I have placed a referral [place] they will contact you.             SDOH assessments and interventions completed:  No   Care Coordination Interventions:  Yes, provided  Interventions Today    Flowsheet Row Most Recent Value  Mental Health Interventions   Mental Health Discussed/Reviewed Mental Health Reviewed       Follow up plan: Follow up call scheduled for 01/15/23    Encounter Outcome:  Pt. Visit Completed   Casimer Lanius, [place] [phone]

## 2023-01-14 NOTE — Patient Instructions (Signed)
Visit Information  Thank you for taking time to visit with me today. Please don't hesitate to contact me if I can be of assistance to you.   Following are the goals we discussed today:   Goals Addressed             This Visit's Progress    Connect with therapist to reduce symptoms of anxiety       Activities and task to complete in order to accomplish goals.   Start / continue relaxed breathing 3 times daily Continue with compliance of taking medication prescribed by Doctor  Self Support options  (work on the list we discussed for your jar to prepare for you stop date) Review your EMMI educational information (Breathing to Relax & Sleep Strategies For People Who Are Stressed or Anxious) Look for an e-mail from [employer]. I have placed a referral [place] they will contact you.             Our next appointment is by telephone on 01/15/23 at 3:00  Please call the care guide team at [phone] if you need to cancel or reschedule your appointment.   If you are experiencing a Mental Health or [place] or need someone to talk to, please call the [place] National Suicide Prevention Lifeline: 909 354 1242 or TTY: 431-296-0749 TTY (351) 486-0732) to talk to a trained counselor call 1-800-273-TALK (toll free, 24 hour hotline)   Patient verbalizes understanding of instructions and care plan provided today and agrees to view in [place]. Active MyChart status and patient understanding of how to access instructions and care plan via MyChart confirmed with patient.     Casimer Lanius, [place] [phone]

## 2023-01-15 ENCOUNTER — Encounter: Payer: Self-pay | Admitting: Licensed Clinical Social Worker

## 2023-01-20 ENCOUNTER — Ambulatory Visit: Payer: Self-pay | Admitting: Licensed Clinical Social Worker

## 2023-01-20 NOTE — Patient Outreach (Signed)
  Care Coordination  Follow Up Visit Note   01/20/2023 Name: Katie Chang MRN: [id number] DOB: 02-26-1961  Katie Chang is a 62 y.o. year old female who sees [place], [place], [place] for primary care. I spoke with  Katie Chang by phone today.  What matters to the patients health and wellness today?  Connecting for therapy to reduce anxiety  Patient continue to utilize cannabis daily to manage symptoms of anxiety. Report she  has changed her plan and decided to reduce smoking to only after 5:00 which is now a 75% reduction in what she was smoking.  Report she has done well with this plan for the past 5 days.  Recommendation: Patient may benefit from, and is in agreement to call [place] to schedule therapy to reduce symptoms of anxiety. .    Goals Addressed             This Visit's Progress    Connect with therapist to reduce symptoms of anxiety       Activities and task to complete in order to accomplish goals.   Start / continue relaxed breathing 3 times daily Continue with compliance of taking medication prescribed by Doctor  Self Support options  (work on the Activity list e-mailed for your jar to prepare for your stop smoking date) Review your EMMI educational information (Breathing to Relax & Sleep Strategies For People Who Are Stressed or Anxious) Look for an e-mail from [employer]. I have placed a referral [place] they have tried to contact you.  Please call them [phone]        SDOH assessments and interventions completed:  No   Care Coordination Interventions:  Yes, provided  Interventions Today    Flowsheet Row Most Recent Value  Chronic Disease   Chronic disease during today's visit Other  [Anxiety & Depression]  Education Interventions   Education Provided Provided Education, Provided Web-based Education  [EMMI education]  Provided Verbal Education On Mental Health/Coping with Illness  Mental Health  Interventions   Mental Health Discussed/Reviewed Mental Health Reviewed, Coping Strategies       Follow up plan: Follow up call scheduled for 2 weeks    Encounter Outcome:  Pt. Visit Completed   Casimer Lanius, [place] [phone]

## 2023-01-20 NOTE — Patient Instructions (Signed)
Visit Information  Thank you for taking time to visit with me today. Please don't hesitate to contact me if I can be of assistance to you.   Following are the goals we discussed today:   Goals Addressed             This Visit's Progress    Connect with therapist to reduce symptoms of anxiety       Activities and task to complete in order to accomplish goals.   Start / continue relaxed breathing 3 times daily Continue with compliance of taking medication prescribed by Doctor  Self Support options  (work on the Activity list e-mailed for your jar to prepare for your stop smoking date) Review your EMMI educational information (Breathing to Relax & Sleep Strategies For People Who Are Stressed or Anxious) Look for an e-mail from [employer]. I have placed a referral [place] they have tried to contact you.  Please call them [phone]        Our next appointment is by telephone on 02/04/23 at 3:00  Please call the care guide team at [phone] if you need to cancel or reschedule your appointment.   If you are experiencing a Mental Health or [place] or need someone to talk to, please call the Suicide and Crisis Lifeline: 988 call the [place] National Suicide Prevention Lifeline: 639-474-4803 or TTY: (254)549-7904 TTY (703)363-3933) to talk to a trained counselor call 1-800-273-TALK (toll free, 24 hour hotline) go to [facility] [address] [place] [phone])   Patient verbalizes understanding of instructions and care plan provided today and agrees to view in [place]. Active MyChart status and patient understanding of how to access instructions and care plan via MyChart confirmed with patient.     Casimer Lanius, [place] [phone]

## 2023-02-03 ENCOUNTER — Encounter: Payer: Self-pay | Admitting: Licensed Clinical Social Worker

## 2023-02-03 ENCOUNTER — Telehealth: Payer: Self-pay | Admitting: Licensed Clinical Social Worker

## 2023-02-03 NOTE — Patient Instructions (Signed)
    I am sorry you were unable to keep your phone appointment today.   The Care Guide will contact you to reschedule the phone appointment    Blessed Girdner, LCSW Social Work Care Coordination  [facility] [phone]  

## 2023-02-03 NOTE — Patient Outreach (Signed)
  Care Coordination   02/03/2023 Name: Leigh Burn MRN: [id number] DOB: [birth year]   Care Coordination Outreach Attempts:  An unsuccessful telephone outreach was attempted for a scheduled appointment today.  Follow Up Plan:  Additional outreach attempts will be made to offer the patient care coordination information and services. Will route to Care Guide to reschedule if no return call is received.  Encounter Outcome:  No Answer   Care Coordination Interventions:  No, not indicated    Casimer Lanius, [place] [phone]

## 2023-02-12 ENCOUNTER — Ambulatory Visit: Payer: Self-pay | Admitting: Licensed Clinical Social Worker

## 2023-02-12 NOTE — Patient Instructions (Signed)
Visit Information  Thank you for taking time to visit with me today. Please don't hesitate to contact me if I can be of assistance to you.   Following are the goals we discussed today:   Goals Addressed             This Visit's Progress    Connect with therapy to reduce symptoms of anxiety       Activities and task to complete in order to accomplish goals.   Continue relaxed breathing 3 times daily Continue with compliance of taking medication prescribed by Doctor  Self Support options  (work on the Activity list e-mailed for your jar to prepare for your stop smoking date) Return paperwork to [place] so they can schedule your appointment [phone]        Our next appointment is by telephone on 03/05/23 at 3;00  Please call the care guide team at [phone] if you need to cancel or reschedule your appointment.   If you are experiencing a Mental Health or [place] or need someone to talk to, please call the Suicide and Crisis Lifeline: 988 call the [place] National Suicide Prevention Lifeline: 215-026-7745 or TTY: (830) 413-9497 TTY 8165681101) to talk to a trained counselor call 1-800-273-TALK (toll free, 24 hour hotline) go to [facility] [address] [place] [phone])   Patient verbalizes understanding of instructions and care plan provided today and agrees to view in [place]. Active MyChart status and patient understanding of how to access instructions and care plan via MyChart confirmed with patient.     Casimer Lanius, [place] [phone]

## 2023-02-12 NOTE — Patient Outreach (Signed)
  Care Coordination  Follow Up Visit Note   02/12/2023 Name: Katie Chang MRN: [id number] DOB: 1961-02-06  Katie Chang is a 62 y.o. year old female who sees [place], [place], [place] for primary care. I spoke with  Katie Chang by phone today.  What matters to the patients health and wellness today?  Reduce symptoms of anxiety and depression  Patient has connected with [facility] and will completed required paperwork in order to get appointment scheduled. She reports an increase in panic attacks over the past few week and continues to decrease her cannabis use.   Goals Addressed             This Visit's Progress    Connect with therapy to reduce symptoms of anxiety       Activities and task to complete in order to accomplish goals.   Continue relaxed breathing 3 times daily Continue with compliance of taking medication prescribed by Doctor  Self Support options  (work on the Activity list e-mailed for your jar to prepare for your stop smoking date) Return paperwork to [facility] so they can schedule your appointment [phone]       SDOH assessments and interventions completed:  Yes  SDOH Interventions Today    Flowsheet Row Most Recent Value  SDOH Interventions   Depression Interventions/Treatment  Counseling, Currently on Treatment  [referral for counseling]          02/12/2023    1:50 PM  Depression screen PHQ 2/9  Decreased Interest 2  Down, Depressed, Hopeless 2  PHQ - 2 Score 4  Altered sleeping 0  Tired, decreased energy 3  Change in appetite 3  Feeling bad or failure about yourself  2  Trouble concentrating 3  Moving slowly or fidgety/restless 3  Suicidal thoughts 0  PHQ-9 Score 18  Difficult doing work/chores Not difficult at all     Care Coordination Interventions:  Yes, provided  Interventions Today    Flowsheet Row Most Recent Value  [place]  Reviewed, Coping Strategies, Anxiety, Depression, Refer to Social Work for counseling  [solution focus ,  emotional support,  relaxed breathing]       Follow up plan: Follow up call scheduled for 03/09/23    Encounter Outcome:  Pt. Visit Completed   Casimer Lanius, [place] [phone]

## 2023-02-27 ENCOUNTER — Emergency Department (HOSPITAL_COMMUNITY): Payer: BC Managed Care – PPO

## 2023-02-27 ENCOUNTER — Encounter (HOSPITAL_COMMUNITY): Payer: Self-pay

## 2023-02-27 ENCOUNTER — Emergency Department (HOSPITAL_COMMUNITY)
Admission: EM | Admit: 2023-02-27 | Discharge: 2023-02-27 | Disposition: A | Discharge status: Home / Self Care | Payer: BC Managed Care – PPO | Source: Home / Self Care | Attending: Emergency Medicine | Admitting: Emergency Medicine

## 2023-02-27 DIAGNOSIS — K219 Gastro-esophageal reflux disease without esophagitis: Secondary | ICD-10-CM | POA: Diagnosis present

## 2023-02-27 DIAGNOSIS — E878 Other disorders of electrolyte and fluid balance, not elsewhere classified: Secondary | ICD-10-CM | POA: Diagnosis not present

## 2023-02-27 DIAGNOSIS — R109 Unspecified abdominal pain: Secondary | ICD-10-CM | POA: Diagnosis not present

## 2023-02-27 DIAGNOSIS — D75839 Thrombocytosis, unspecified: Secondary | ICD-10-CM | POA: Insufficient documentation

## 2023-02-27 DIAGNOSIS — F17210 Nicotine dependence, cigarettes, uncomplicated: Secondary | ICD-10-CM | POA: Diagnosis present

## 2023-02-27 DIAGNOSIS — R1110 Vomiting, unspecified: Secondary | ICD-10-CM | POA: Diagnosis not present

## 2023-02-27 DIAGNOSIS — Z888 Allergy status to other drugs, medicaments and biological substances status: Secondary | ICD-10-CM | POA: Diagnosis not present

## 2023-02-27 DIAGNOSIS — G43A1 Cyclical vomiting, intractable: Secondary | ICD-10-CM | POA: Diagnosis not present

## 2023-02-27 DIAGNOSIS — D72829 Elevated white blood cell count, unspecified: Secondary | ICD-10-CM | POA: Insufficient documentation

## 2023-02-27 DIAGNOSIS — Z79899 Other long term (current) drug therapy: Secondary | ICD-10-CM | POA: Insufficient documentation

## 2023-02-27 DIAGNOSIS — F1210 Cannabis abuse, uncomplicated: Secondary | ICD-10-CM | POA: Diagnosis not present

## 2023-02-27 DIAGNOSIS — K920 Hematemesis: Secondary | ICD-10-CM | POA: Diagnosis not present

## 2023-02-27 DIAGNOSIS — Z1152 Encounter for screening for COVID-19: Secondary | ICD-10-CM | POA: Diagnosis not present

## 2023-02-27 DIAGNOSIS — Z881 Allergy status to other antibiotic agents status: Secondary | ICD-10-CM | POA: Diagnosis not present

## 2023-02-27 DIAGNOSIS — J189 Pneumonia, unspecified organism: Secondary | ICD-10-CM | POA: Diagnosis not present

## 2023-02-27 DIAGNOSIS — F32A Depression, unspecified: Secondary | ICD-10-CM | POA: Diagnosis not present

## 2023-02-27 DIAGNOSIS — R9431 Abnormal electrocardiogram [ECG] [EKG]: Secondary | ICD-10-CM | POA: Diagnosis present

## 2023-02-27 DIAGNOSIS — Z66 Do not resuscitate: Secondary | ICD-10-CM | POA: Diagnosis not present

## 2023-02-27 DIAGNOSIS — R1084 Generalized abdominal pain: Secondary | ICD-10-CM | POA: Insufficient documentation

## 2023-02-27 DIAGNOSIS — Z8249 Family history of ischemic heart disease and other diseases of the circulatory system: Secondary | ICD-10-CM | POA: Diagnosis not present

## 2023-02-27 DIAGNOSIS — I10 Essential (primary) hypertension: Secondary | ICD-10-CM | POA: Insufficient documentation

## 2023-02-27 DIAGNOSIS — I700 Atherosclerosis of aorta: Secondary | ICD-10-CM | POA: Diagnosis not present

## 2023-02-27 DIAGNOSIS — K2970 Gastritis, unspecified, without bleeding: Secondary | ICD-10-CM | POA: Diagnosis not present

## 2023-02-27 DIAGNOSIS — G8929 Other chronic pain: Secondary | ICD-10-CM | POA: Diagnosis present

## 2023-02-27 DIAGNOSIS — J45909 Unspecified asthma, uncomplicated: Secondary | ICD-10-CM | POA: Diagnosis not present

## 2023-02-27 DIAGNOSIS — E785 Hyperlipidemia, unspecified: Secondary | ICD-10-CM | POA: Diagnosis not present

## 2023-02-27 DIAGNOSIS — R1115 Cyclical vomiting syndrome unrelated to migraine: Secondary | ICD-10-CM | POA: Insufficient documentation

## 2023-02-27 DIAGNOSIS — R112 Nausea with vomiting, unspecified: Secondary | ICD-10-CM | POA: Diagnosis not present

## 2023-02-27 DIAGNOSIS — F1290 Cannabis use, unspecified, uncomplicated: Secondary | ICD-10-CM | POA: Diagnosis not present

## 2023-02-27 DIAGNOSIS — E871 Hypo-osmolality and hyponatremia: Secondary | ICD-10-CM | POA: Diagnosis not present

## 2023-02-27 DIAGNOSIS — E876 Hypokalemia: Secondary | ICD-10-CM | POA: Diagnosis not present

## 2023-02-27 DIAGNOSIS — K760 Fatty (change of) liver, not elsewhere classified: Secondary | ICD-10-CM | POA: Diagnosis not present

## 2023-02-27 DIAGNOSIS — F419 Anxiety disorder, unspecified: Secondary | ICD-10-CM | POA: Insufficient documentation

## 2023-02-27 DIAGNOSIS — Z720 Tobacco use: Secondary | ICD-10-CM | POA: Diagnosis not present

## 2023-02-27 DIAGNOSIS — R1010 Upper abdominal pain, unspecified: Secondary | ICD-10-CM | POA: Diagnosis not present

## 2023-02-27 DIAGNOSIS — E860 Dehydration: Secondary | ICD-10-CM | POA: Diagnosis not present

## 2023-02-27 DIAGNOSIS — F12188 Cannabis abuse with other cannabis-induced disorder: Secondary | ICD-10-CM | POA: Diagnosis not present

## 2023-02-27 LAB — CBC WITH DIFFERENTIAL/PLATELET
Abs Immature Granulocytes: 0.04 10*3/uL (ref 0.00–0.07)
Basophils Absolute: 0.1 10*3/uL (ref 0.0–0.1)
Basophils Relative: 1 %
Eosinophils Absolute: 0 10*3/uL (ref 0.0–0.5)
Eosinophils Relative: 0 %
HCT: 38.1 % (ref 36.0–46.0)
Hemoglobin: 13 g/dL (ref 12.0–15.0)
Immature Granulocytes: 0 %
Lymphocytes Relative: 13 %
Lymphs Abs: 1.5 10*3/uL (ref 0.7–4.0)
MCH: 26.2 pg (ref 26.0–34.0)
MCHC: 34.1 g/dL (ref 30.0–36.0)
MCV: 76.7 fL — ABNORMAL LOW (ref 80.0–100.0)
Monocytes Absolute: 0.4 10*3/uL (ref 0.1–1.0)
Monocytes Relative: 3 %
Neutro Abs: 9 10*3/uL — ABNORMAL HIGH (ref 1.7–7.7)
Neutrophils Relative %: 83 %
Platelets: 488 10*3/uL — ABNORMAL HIGH (ref 150–400)
RBC: 4.97 MIL/uL (ref 3.87–5.11)
RDW: 14.9 % (ref 11.5–15.5)
WBC: 10.9 10*3/uL — ABNORMAL HIGH (ref 4.0–10.5)
nRBC: 0 % (ref 0.0–0.2)

## 2023-02-27 LAB — COMPREHENSIVE METABOLIC PANEL
ALT: 15 U/L (ref 0–44)
AST: 22 U/L (ref 15–41)
Albumin: 4.6 g/dL (ref 3.5–5.0)
Alkaline Phosphatase: 74 U/L (ref 38–126)
Anion gap: 15 (ref 5–15)
BUN: 9 mg/dL (ref 8–23)
CO2: 19 mmol/L — ABNORMAL LOW (ref 22–32)
Calcium: 9.8 mg/dL (ref 8.9–10.3)
Chloride: 103 mmol/L (ref 98–111)
Creatinine, Ser: 0.76 mg/dL (ref 0.44–1.00)
GFR, Estimated: >60 mL/min (ref >60)
Glucose, Bld: 157 mg/dL — ABNORMAL HIGH (ref 70–99)
Potassium: 3.6 mmol/L (ref 3.5–5.1)
Sodium: 137 mmol/L (ref 135–145)
Total Bilirubin: 0.8 mg/dL (ref 0.3–1.2)
Total Protein: 8.1 g/dL (ref 6.5–8.1)

## 2023-02-27 LAB — LIPASE, BLOOD: Lipase: 26 U/L (ref 11–51)

## 2023-02-27 LAB — MAGNESIUM: Magnesium: 1.9 mg/dL (ref 1.7–2.4)

## 2023-02-27 MED ORDER — DICYCLOMINE HCL 10 MG/ML IM SOLN
20.0000 mg | Freq: Once | INTRAMUSCULAR | Status: AC
  Administered 2023-02-27: 20 mg via INTRAMUSCULAR
  Filled 2023-02-27: qty 2

## 2023-02-27 MED ORDER — LACTATED RINGERS IV BOLUS
1000.0000 mL | Freq: Once | INTRAVENOUS | Status: AC
  Administered 2023-02-27: 1000 mL via INTRAVENOUS

## 2023-02-27 MED ORDER — METOCLOPRAMIDE HCL 5 MG/ML IJ SOLN
10.0000 mg | Freq: Once | INTRAMUSCULAR | Status: AC
  Administered 2023-02-27: 10 mg via INTRAVENOUS
  Filled 2023-02-27: qty 2

## 2023-02-27 MED ORDER — PANTOPRAZOLE SODIUM 40 MG IV SOLR
40.0000 mg | Freq: Once | INTRAVENOUS | Status: AC
  Administered 2023-02-27: 40 mg via INTRAVENOUS
  Filled 2023-02-27: qty 10

## 2023-02-27 MED ORDER — ONDANSETRON HCL 4 MG/2ML IJ SOLN
4.0000 mg | Freq: Once | INTRAMUSCULAR | Status: AC
  Administered 2023-02-27: 4 mg via INTRAVENOUS
  Filled 2023-02-27: qty 2

## 2023-02-27 MED ORDER — HYDROMORPHONE HCL 1 MG/ML IJ SOLN
1.0000 mg | Freq: Once | INTRAMUSCULAR | Status: AC
  Administered 2023-02-27: 1 mg via INTRAVENOUS
  Filled 2023-02-27: qty 1

## 2023-02-27 MED ORDER — KETOROLAC TROMETHAMINE 15 MG/ML IJ SOLN
15.0000 mg | Freq: Once | INTRAMUSCULAR | Status: AC
  Administered 2023-02-27: 15 mg via INTRAVENOUS
  Filled 2023-02-27: qty 1

## 2023-02-27 MED ORDER — DIPHENHYDRAMINE HCL 50 MG/ML IJ SOLN
25.0000 mg | Freq: Once | INTRAMUSCULAR | Status: AC
  Administered 2023-02-27: 25 mg via INTRAVENOUS
  Filled 2023-02-27: qty 1

## 2023-02-27 MED ORDER — SODIUM CHLORIDE 0.9 % IV BOLUS
500.0000 mL | Freq: Once | INTRAVENOUS | Status: AC
  Administered 2023-02-27: 500 mL via INTRAVENOUS

## 2023-02-27 NOTE — ED Notes (Signed)
Pt refused to get up to go to the bathroom to give [place] a urine sample.

## 2023-02-27 NOTE — ED Notes (Signed)
Pt is crying out yelling that she is in pain. Dow Adolph, PA at bedside.

## 2023-02-27 NOTE — ED Provider Notes (Signed)
[facility] EMERGENCY DEPARTMENT AT [facility] Provider Note   CSN: [id number] Arrival date & time: 02/27/23  1221     History  Chief Complaint  Patient presents with   Emesis    Katie Chang is a 62 y.o. female with PMH including cyclical vomiting syndrome, GERD, HTN, migraines who presents to ED complaining of nausea, vomiting, and generalized abdominal pain since this morning.  She does have a history of cyclical vomiting syndrome suspected to be due to cannabinoid use.  She will not answer me when asked if she is still using marijuana.  She states that she has not previously had a GI evaluation.  She denies any other substance use.  She denies alcohol use.  She states that her symptoms are typical for her cyclical vomiting syndrome.  She denies associated chest pain, shortness of breath, fever, chills, diarrhea, urinary symptoms.      Home Medications Prior to Admission medications   Medication Sig Start Date End Date Taking? Authorizing Provider  albuterol (PROAIR HFA) 108 (90 BASE) MCG/ACT inhaler Inhale 2 puffs into the lungs every 6 (six) hours as needed for wheezing. 03/11/13   Sondra Barges, PA-C  amLODipine (NORVASC) 5 MG tablet Take 5 mg by mouth daily. 03/29/22   [provider]  Capsaicin-Menthol-Methyl Sal (CAPSAICIN-METHYL SAL-MENTHOL) 0.025-1-12 % CREA Apply 1 Application topically 2 (two) times daily as needed. Patient not taking: Reported on 12/25/2022 08/30/22   Tanda Rockers A, DO  celecoxib (CELEBREX) 200 MG capsule Take 200 mg by mouth daily. 05/23/22   [provider]  cholecalciferol (VITAMIN D3) 25 MCG (1000 UT) tablet Take 2,000 Units by mouth daily.    [provider]  esomeprazole (NEXIUM) 40 MG capsule Take 40 mg by mouth daily. 03/04/18   [provider]  fluticasone (FLONASE) 50 MCG/ACT nasal spray Place 2 sprays into both nostrils daily as needed for allergies.    [provider]  levocetirizine (XYZAL) 5 MG  tablet Take 5 mg by mouth every evening.    [provider]  LORazepam (ATIVAN) 1 MG tablet Take 1 mg by mouth at bedtime. 04/09/18   [provider]  magnesium hydroxide (MILK OF MAGNESIA) 400 MG/5ML suspension Take 30 mLs by mouth daily as needed for mild constipation.    [provider]  metoCLOPramide (REGLAN) 5 MG tablet Take 1 tablet (5 mg total) by mouth every 6 (six) hours as needed for nausea. Patient not taking: Reported on 12/25/2022 08/30/22   Tanda Rockers A, DO  metoprolol succinate (TOPROL-XL) 25 MG 24 hr tablet Take 25 mg by mouth daily. 04/12/22   [provider]  montelukast (SINGULAIR) 10 MG tablet TAKE ONE TABLET AT BEDTIME. 04/22/14   Ethelda Chick, MD  ondansetron (ZOFRAN) 4 MG tablet Take 1 tablet (4 mg total) by mouth every 6 (six) hours. 12/25/22   Carroll Sage, PA-C  ondansetron (ZOFRAN-ODT) 4 MG disintegrating tablet Take 1 tablet (4 mg total) by mouth every 8 (eight) hours as needed for nausea or vomiting. Patient not taking: Reported on 12/15/2022 08/28/22   Elson Areas, PA-C  potassium chloride 20 MEQ TBCR Take 1 tablet (20 mEq total) by mouth daily for 5 days. 12/25/22 12/30/22  Carroll Sage, PA-C  rosuvastatin (CRESTOR) 10 MG tablet Take 10 mg by mouth as directed. Take Mon-Fri Only. 03/29/22   [provider]  sodium chloride 1 g tablet Take 1 g by mouth 2 (two) times daily with a  meal.    [provider]  valsartan (DIOVAN) 80 MG tablet Take 80 mg by mouth at bedtime. 03/07/22   [provider]  venlafaxine (EFFEXOR) 37.5 MG tablet Take 112.5 mg by mouth daily. 07/31/22   [provider]      Allergies    Avelox [moxifloxacin hcl in nacl] and Haldol [haloperidol lactate]    Review of Systems   Review of Systems  All other systems reviewed and are negative.   Physical Exam Updated Vital Signs BP (!) 163/101   Pulse 85   Temp 98.2 F (36.8 C) (Oral)   Resp 16   Ht 5\' 3"  (1.6 m)    Wt 72.6 kg   SpO2 100%   BMI 28.34 kg/m  Physical Exam Vitals and nursing note reviewed.  Constitutional:      Appearance: Normal appearance. She is not ill-appearing, toxic-appearing or diaphoretic.     Comments: On entry to exam room, patient is lying down on hospital stretcher with eyes closed resting in no acute distress, I was able to observe her for approximately 2 minutes and she did not exhibit any signs of distress or pain, with knocking as patient did not arouse with entering room patient began suddenly screaming, moaning, yelling out for help stating she needed Dilaudid   HENT:     Head: Normocephalic and atraumatic.     Mouth/Throat:     Mouth: Mucous membranes are moist.     Pharynx: Oropharynx is clear. No oropharyngeal exudate or posterior oropharyngeal erythema.  Eyes:     General: No scleral icterus.    Extraocular Movements: Extraocular movements intact.     Conjunctiva/sclera: Conjunctivae normal.     Pupils: Pupils are equal, round, and reactive to light.  Cardiovascular:     Rate and Rhythm: Normal rate and regular rhythm.     Heart sounds: No murmur heard. Pulmonary:     Effort: Pulmonary effort is normal. No respiratory distress.     Breath sounds: Normal breath sounds. No stridor. No wheezing, rhonchi or rales.  Chest:     Chest wall: No tenderness.  Abdominal:     General: Abdomen is flat. There is no distension.     Palpations: Abdomen is soft. There is no mass.     Tenderness: There is no abdominal tenderness. There is no right CVA tenderness, left CVA tenderness, guarding or rebound.     Hernia: No hernia is present.  Musculoskeletal:        General: Normal range of motion.     Cervical back: Normal range of motion and neck supple.     Right lower leg: No edema.     Left lower leg: No edema.  Skin:    General: Skin is warm and dry.     Capillary Refill: Capillary refill takes less than 2 seconds.     Coloration: Skin is not jaundiced or pale.      Findings: No rash.  Neurological:     General: No focal deficit present.     Mental Status: She is alert. Mental status is at baseline.  Psychiatric:        Mood and Affect: Mood is anxious. Affect is tearful.        Behavior: Behavior is uncooperative and agitated (easily).     ED Results / Procedures / Treatments   Labs (all labs ordered are listed, but only abnormal results are displayed) Labs Reviewed  CBC WITH DIFFERENTIAL/PLATELET - Abnormal; Notable for the  following components:      Result Value   WBC 10.9 (*)    MCV 76.7 (*)    Platelets 488 (*)    Neutro Abs 9.0 (*)    All other components within normal limits  COMPREHENSIVE METABOLIC PANEL - Abnormal; Notable for the following components:   CO2 19 (*)    Glucose, Bld 157 (*)    All other components within normal limits  LIPASE, BLOOD  MAGNESIUM    EKG EKG Interpretation  Date/Time:  Thursday February 27 2023 12:52:05 EDT Ventricular Rate:  82 PR Interval:  172 QRS Duration: 95 QT Interval:  416 QTC Calculation: 486 R Axis:   64 Text Interpretation: Sinus rhythm Probable left atrial enlargement Left ventricular hypertrophy Borderline prolonged QT interval No significant change since last tracing Confirmed by Jacalyn Lefevre [phone]) on 02/27/2023 1:00:40 PM  Radiology DG Chest 2 View  Result Date: 02/27/2023 CLINICAL DATA:  severe vomiting EXAM: CHEST - 2 VIEW COMPARISON:  Chest x-ray 08/11/2020 FINDINGS: The heart and mediastinal contours are within normal limits. No focal consolidation. No pulmonary edema. No pleural effusion. No pneumothorax. No acute osseous abnormality. IMPRESSION: No active cardiopulmonary disease. Electronically Signed   By: Tish Frederickson M.D.   On: 02/27/2023 14:18    Procedures Procedures    Medications Ordered in ED Medications  dicyclomine (BENTYL) injection 20 mg (has no administration in time range)  metoCLOPramide (REGLAN) injection 10 mg (10 mg Intravenous Given 02/27/23 1314)   diphenhydrAMINE (BENADRYL) injection 25 mg (25 mg Intravenous Given 02/27/23 1314)  ketorolac (TORADOL) 15 MG/ML injection 15 mg (15 mg Intravenous Given 02/27/23 1314)  pantoprazole (PROTONIX) injection 40 mg (40 mg Intravenous Given 02/27/23 1314)  HYDROmorphone (DILAUDID) injection 1 mg (1 mg Intravenous Given 02/27/23 1314)  lactated ringers bolus 1,000 mL (0 mLs Intravenous Stopped 02/27/23 1518)  ondansetron (ZOFRAN) injection 4 mg (4 mg Intravenous Given 02/27/23 1516)  sodium chloride 0.9 % bolus 500 mL (0 mLs Intravenous Stopped 02/27/23 1552)    ED Course/ Medical Decision Making/ A&P                             Medical Decision Making Amount and/or Complexity of Data Reviewed Labs: ordered. Decision-making details documented in ED Course. Radiology: ordered. Decision-making details documented in ED Course. ECG/medicine tests: ordered. Decision-making details documented in ED Course.   Medical Decision Making:   Katie Chang is a 62 y.o. female who presented to the ED today with vomiting, abdominal pain detailed above.    Patient's presentation is complicated by their history of multiple comorbidities, history of marijuana use, cyclical vomiting syndrome.  Patient placed on continuous vitals and telemetry monitoring while in ED which was reviewed periodically.  Complete initial physical exam performed, notably the patient  was in no acute distress on multiple observations. Abdomen soft, nontender, nondistended. No pulsatile or other masses. No rebound or guarding. No CVA tenderness. Moist mucus membranes and normal capillary refill. Pt easily agitated and intermittently uncooperative with exam.     Reviewed and confirmed nursing documentation for past medical history, family history, social history.    Initial Assessment:   With the patient's presentation of abdominal pain, differential diagnosis includes but is not limited to AAA, mesenteric ischemia, appendicitis, diverticulitis,  DKA, gastritis, gastroenteritis, AMI, nephrolithiasis, pancreatitis, peritonitis, adrenal insufficiency, intestinal ischemia, constipation, UTI, SBO/LBO, splenic rupture, biliary disease, IBD, IBS, PUD, hepatitis, STD, ovarian/testicular torsion, electrolyte disturbance, DKA, dehydration, acute kidney  injury, renal failure, cholecystitis, cholelithiasis, choledocholithiasis, abdominal pain of  unknown etiology, somatoform disorder, marijuana abuse, cyclical vomiting syndrome, polysubstance abuse, chronic pain, malingering.  Initial Plan:  Screening labs including CBC and Metabolic panel with magnesium to evaluate for infectious or metabolic etiology of disease.  Lipase to evaluate for pancreatitis Urinalysis with reflex culture ordered to evaluate for UTI or relevant urologic/nephrologic pathology.  UDS also ordered. Patient refused to provide sample. EKG to evaluate for cardiac pathology Symptomatic management Objective evaluation as reviewed   Initial Study Results:   Laboratory  All laboratory results reviewed without evidence of clinically relevant pathology.   Exceptions include: WBC 10.9, PLT 488   EKG EKG was reviewed independently. ST segments without concerns for elevations.   EKG: unchanged from previous tracings, normal sinus rhythm.   Radiology:  All images reviewed independently. Agree with radiology report at this time.   DG Chest 2 View  Result Date: 02/27/2023 CLINICAL DATA:  severe vomiting EXAM: CHEST - 2 VIEW COMPARISON:  Chest x-ray 08/11/2020 FINDINGS: The heart and mediastinal contours are within normal limits. No focal consolidation. No pulmonary edema. No pleural effusion. No pneumothorax. No acute osseous abnormality. IMPRESSION: No active cardiopulmonary disease. Electronically Signed   By: Tish FredericksonMorgane  Naveau M.D.   On: 02/27/2023 14:18      Final Assessment and Plan:   This is a 62 year old female with a past medical history of cyclical vomiting syndrome secondary  to cannabis use who presents to the ED complaining of 1 day of generalized abdominal pain, nausea, and vomiting.  Reports unable to control nausea.  On exam, patient's abdomen is soft, nontender, nondistended, and without rebound, guarding, or peritoneal signs.  No masses identified. No CVA tenderness.  Patient does not appear clinically dehydrated.  On initial and multiple repeat assessments, patient is resting comfortably without signs of distress through exam room window or entry to exam room when unnoticed and when introducing self or making presence aware will begin screaming and writing in pain repeatedly requesting Dilaudid as the only thing that helps her pain.  Exam is inconsistent with acute abdomen.  Suspect that patient's symptoms are an exacerbation of chronic pain.  Patient refused to provide urine sample so unable to test for urinary etiology and/or obtain UDS.  Multiple reexaminations with extensive discussion of findings with patient.  Kidney function is normal.  No significant electrolyte disturbance.  Very minimal leukocytosis at 10.9.  Suspect this is reactive.  No indication for imaging or emergent admission to the hospital at this time.  Discussed with patient that as she has not previously had GI evaluation she would benefit from this for management of her chronic abdominal pain and likely recurrent cyclical vomiting syndrome.  Patient without active vomiting on multiple reassessments.  She received multiple medications including pain medication, antiemetics, and IV fluids during ED stay. On final examination, pt severely agitated regarding anticipated discharge home.  Demanding additional doses of Dilaudid.  Discussed with patient that I will not be providing this as her workup today is reassuring as well as her physical exam.  I will give her a dose of Bentyl to help with abdominal pain.  She has already received Toradol, Protonix, Dilaudid, Reglan, Zofran. Again do not see indication for  further narcotic pain medication and discussed this with pt along with nurse at bedside. Pt stable for discharge. Aware this will be last medication ordered during this visit and for new or worsening symptoms she can return for reassessment if needed. Strict  ED return precautions given.   Clinical Impression:  1. Cyclical vomiting      Discharge           Final Clinical Impression(s) / ED Diagnoses Final diagnoses:  Cyclical vomiting    Rx / DC Orders ED Discharge Orders          Ordered    Ambulatory referral to Gastroenterology        02/27/23 1540              Richardson Dopp 02/27/23 1607    Jacalyn Lefevre, MD 02/28/23 [phone]

## 2023-02-27 NOTE — Discharge Instructions (Addendum)
Thank you for letting us take care of you today.  Overall, your workup today was very reassuring.  You do not have any significant electrolyte disturbances or appear severely dehydrated.  Your kidney function was normal.  We gave you fluids and multiple nausea medications as well as pain medication to help with your symptoms.  With your history of cyclical vomiting syndrome, I do believe that you would benefit from seeing GI.  I have referred you to GI.  They should call you to schedule a follow-up appointment within 72 hours.  If you do not hear from them, please call their office and schedule this follow-up.  They will be able to better control your chronic problems with nausea and abdominal pain.  If you are still consuming marijuana, it is very important that you stop using this as it typically exacerbates your condition.  For any new or worsening, emergent symptoms, please return to the nearest emergency department for reevaluation.

## 2023-02-27 NOTE — ED Notes (Signed)
Pt reports she is calling her father to come pick her up. Pt verbalized understanding of d/c instructions ans follow up care.

## 2023-02-27 NOTE — ED Notes (Signed)
  Pt able to drink ginger ale without vomiting. 

## 2023-02-27 NOTE — ED Triage Notes (Signed)
Pt arrives today stating she has had emesis since waking this morning. Pt states her bed is "covered" in it. Pt states she had the same approx 2 months ago, dx with cyclic vomiting.

## 2023-02-28 ENCOUNTER — Other Ambulatory Visit: Payer: Self-pay

## 2023-02-28 ENCOUNTER — Emergency Department (HOSPITAL_BASED_OUTPATIENT_CLINIC_OR_DEPARTMENT_OTHER): Payer: BC Managed Care – PPO

## 2023-02-28 ENCOUNTER — Emergency Department (HOSPITAL_BASED_OUTPATIENT_CLINIC_OR_DEPARTMENT_OTHER)
Admission: EM | Admit: 2023-02-28 | Discharge: 2023-02-28 | Disposition: A | Discharge status: Home / Self Care | Payer: BC Managed Care – PPO | Source: Home / Self Care | Attending: Emergency Medicine | Admitting: Emergency Medicine

## 2023-02-28 ENCOUNTER — Encounter (HOSPITAL_BASED_OUTPATIENT_CLINIC_OR_DEPARTMENT_OTHER): Payer: Self-pay

## 2023-02-28 ENCOUNTER — Encounter (HOSPITAL_BASED_OUTPATIENT_CLINIC_OR_DEPARTMENT_OTHER): Payer: Self-pay | Admitting: Emergency Medicine

## 2023-02-28 ENCOUNTER — Emergency Department (HOSPITAL_BASED_OUTPATIENT_CLINIC_OR_DEPARTMENT_OTHER)
Admission: EM | Admit: 2023-02-28 | Discharge: 2023-03-01 | Disposition: A | Discharge status: Home / Self Care | Payer: BC Managed Care – PPO | Source: Home / Self Care | Attending: Emergency Medicine | Admitting: Emergency Medicine

## 2023-02-28 DIAGNOSIS — R000 Tachycardia, unspecified: Secondary | ICD-10-CM | POA: Insufficient documentation

## 2023-02-28 DIAGNOSIS — I700 Atherosclerosis of aorta: Secondary | ICD-10-CM | POA: Diagnosis not present

## 2023-02-28 DIAGNOSIS — Z79899 Other long term (current) drug therapy: Secondary | ICD-10-CM | POA: Insufficient documentation

## 2023-02-28 DIAGNOSIS — R112 Nausea with vomiting, unspecified: Secondary | ICD-10-CM | POA: Insufficient documentation

## 2023-02-28 DIAGNOSIS — E876 Hypokalemia: Secondary | ICD-10-CM

## 2023-02-28 DIAGNOSIS — K760 Fatty (change of) liver, not elsewhere classified: Secondary | ICD-10-CM | POA: Diagnosis not present

## 2023-02-28 DIAGNOSIS — J45909 Unspecified asthma, uncomplicated: Secondary | ICD-10-CM | POA: Insufficient documentation

## 2023-02-28 DIAGNOSIS — E860 Dehydration: Secondary | ICD-10-CM

## 2023-02-28 DIAGNOSIS — F12188 Cannabis abuse with other cannabis-induced disorder: Secondary | ICD-10-CM | POA: Insufficient documentation

## 2023-02-28 DIAGNOSIS — R1110 Vomiting, unspecified: Secondary | ICD-10-CM | POA: Diagnosis not present

## 2023-02-28 DIAGNOSIS — Z7951 Long term (current) use of inhaled steroids: Secondary | ICD-10-CM | POA: Insufficient documentation

## 2023-02-28 DIAGNOSIS — R1010 Upper abdominal pain, unspecified: Secondary | ICD-10-CM

## 2023-02-28 DIAGNOSIS — R109 Unspecified abdominal pain: Secondary | ICD-10-CM | POA: Diagnosis not present

## 2023-02-28 LAB — URINALYSIS, ROUTINE W REFLEX MICROSCOPIC
Bacteria, UA: NONE SEEN
Bilirubin Urine: NEGATIVE
Glucose, UA: NEGATIVE mg/dL
Ketones, ur: 40 mg/dL — AB
Leukocytes,Ua: NEGATIVE
Nitrite: NEGATIVE
Protein, ur: 30 mg/dL — AB
Specific Gravity, Urine: 1.015 (ref 1.005–1.030)
pH: 6.5 (ref 5.0–8.0)

## 2023-02-28 LAB — COMPREHENSIVE METABOLIC PANEL
ALT: 12 U/L (ref 0–44)
ALT: 15 U/L (ref 0–44)
AST: 23 U/L (ref 15–41)
AST: 34 U/L (ref 15–41)
Albumin: 5.1 g/dL — ABNORMAL HIGH (ref 3.5–5.0)
Albumin: 5 g/dL (ref 3.5–5.0)
Alkaline Phosphatase: 72 U/L (ref 38–126)
Alkaline Phosphatase: 70 U/L (ref 38–126)
Anion gap: 20 — ABNORMAL HIGH (ref 5–15)
Anion gap: 18 — ABNORMAL HIGH (ref 5–15)
BUN: 12 mg/dL (ref 8–23)
BUN: 7 mg/dL — ABNORMAL LOW (ref 8–23)
CO2: 17 mmol/L — ABNORMAL LOW (ref 22–32)
CO2: 17 mmol/L — ABNORMAL LOW (ref 22–32)
Calcium: 10.3 mg/dL (ref 8.9–10.3)
Calcium: 10 mg/dL (ref 8.9–10.3)
Chloride: 91 mmol/L — ABNORMAL LOW (ref 98–111)
Chloride: 93 mmol/L — ABNORMAL LOW (ref 98–111)
Creatinine, Ser: 0.75 mg/dL (ref 0.44–1.00)
Creatinine, Ser: 0.59 mg/dL (ref 0.44–1.00)
GFR, Estimated: >60 mL/min (ref >60)
GFR, Estimated: >60 mL/min (ref >60)
Glucose, Bld: 153 mg/dL — ABNORMAL HIGH (ref 70–99)
Glucose, Bld: 130 mg/dL — ABNORMAL HIGH (ref 70–99)
Potassium: 2.7 mmol/L — CL (ref 3.5–5.1)
Potassium: 2.7 mmol/L — CL (ref 3.5–5.1)
Sodium: 128 mmol/L — ABNORMAL LOW (ref 135–145)
Sodium: 128 mmol/L — ABNORMAL LOW (ref 135–145)
Total Bilirubin: 1.2 mg/dL (ref 0.3–1.2)
Total Bilirubin: 1.2 mg/dL (ref 0.3–1.2)
Total Protein: 7.7 g/dL (ref 6.5–8.1)
Total Protein: 7.4 g/dL (ref 6.5–8.1)

## 2023-02-28 LAB — RAPID URINE DRUG SCREEN, HOSP PERFORMED
Amphetamines: NOT DETECTED
Barbiturates: NOT DETECTED
Benzodiazepines: NOT DETECTED
Cocaine: NOT DETECTED
Opiates: POSITIVE — AB
Tetrahydrocannabinol: POSITIVE — AB

## 2023-02-28 LAB — LIPASE, BLOOD
Lipase: <10 U/L — ABNORMAL LOW (ref 11–51)
Lipase: <10 U/L — ABNORMAL LOW (ref 11–51)

## 2023-02-28 LAB — CBC
HCT: 36.5 % (ref 36.0–46.0)
HCT: 34.4 % — ABNORMAL LOW (ref 36.0–46.0)
Hemoglobin: 12.8 g/dL (ref 12.0–15.0)
Hemoglobin: 12 g/dL (ref 12.0–15.0)
MCH: 25.8 pg — ABNORMAL LOW (ref 26.0–34.0)
MCH: 25.5 pg — ABNORMAL LOW (ref 26.0–34.0)
MCHC: 35.1 g/dL (ref 30.0–36.0)
MCHC: 34.9 g/dL (ref 30.0–36.0)
MCV: 73.6 fL — ABNORMAL LOW (ref 80.0–100.0)
MCV: 73 fL — ABNORMAL LOW (ref 80.0–100.0)
Platelets: 512 10*3/uL — ABNORMAL HIGH (ref 150–400)
Platelets: 489 10*3/uL — ABNORMAL HIGH (ref 150–400)
RBC: 4.96 MIL/uL (ref 3.87–5.11)
RBC: 4.71 MIL/uL (ref 3.87–5.11)
RDW: 15.2 % (ref 11.5–15.5)
RDW: 15.1 % (ref 11.5–15.5)
WBC: 21.8 10*3/uL — ABNORMAL HIGH (ref 4.0–10.5)
WBC: 20.3 10*3/uL — ABNORMAL HIGH (ref 4.0–10.5)
nRBC: 0 % (ref 0.0–0.2)
nRBC: 0 % (ref 0.0–0.2)

## 2023-02-28 LAB — LACTIC ACID, PLASMA: Lactic Acid, Venous: 2 mmol/L (ref 0.5–1.9)

## 2023-02-28 MED ORDER — LACTATED RINGERS IV BOLUS
1000.0000 mL | Freq: Once | INTRAVENOUS | Status: AC
  Administered 2023-02-28: 1000 mL via INTRAVENOUS

## 2023-02-28 MED ORDER — HYDROMORPHONE HCL 1 MG/ML IJ SOLN
0.5000 mg | Freq: Once | INTRAMUSCULAR | Status: AC
  Administered 2023-02-28: 0.5 mg via INTRAVENOUS
  Filled 2023-02-28: qty 1

## 2023-02-28 MED ORDER — ONDANSETRON 8 MG PO TBDP: 8.0000 mg | ORAL_TABLET | Freq: Three times a day (TID) | ORAL | 0 refills | Status: DC | PRN

## 2023-02-28 MED ORDER — DIAZEPAM 5 MG/ML IJ SOLN
2.5000 mg | Freq: Once | INTRAMUSCULAR | Status: AC
  Administered 2023-02-28: 2.5 mg via INTRAVENOUS
  Filled 2023-02-28: qty 2

## 2023-02-28 MED ORDER — POTASSIUM CHLORIDE CRYS ER 20 MEQ PO TBCR: EXTENDED_RELEASE_TABLET | ORAL | 0 refills | Status: DC

## 2023-02-28 MED ORDER — POTASSIUM CHLORIDE 10 MEQ/100ML IV SOLN
10.0000 meq | INTRAVENOUS | Status: AC
  Administered 2023-02-28 (×2): 10 meq via INTRAVENOUS
  Filled 2023-02-28 (×2): qty 100

## 2023-02-28 MED ORDER — POTASSIUM CHLORIDE 10 MEQ/100ML IV SOLN
10.0000 meq | INTRAVENOUS | Status: AC
  Administered 2023-02-28 – 2023-03-01 (×3): 10 meq via INTRAVENOUS
  Filled 2023-02-28 (×2): qty 100

## 2023-02-28 MED ORDER — POTASSIUM CHLORIDE CRYS ER 20 MEQ PO TBCR
40.0000 meq | EXTENDED_RELEASE_TABLET | Freq: Once | ORAL | Status: AC
  Administered 2023-02-28: 40 meq via ORAL
  Filled 2023-02-28: qty 2

## 2023-02-28 MED ORDER — ONDANSETRON HCL 4 MG/2ML IJ SOLN
4.0000 mg | Freq: Once | INTRAMUSCULAR | Status: AC
  Administered 2023-02-28: 4 mg via INTRAVENOUS
  Filled 2023-02-28: qty 2

## 2023-02-28 MED ORDER — PANTOPRAZOLE SODIUM 40 MG IV SOLR
40.0000 mg | Freq: Once | INTRAVENOUS | Status: AC
  Administered 2023-02-28: 40 mg via INTRAVENOUS
  Filled 2023-02-28: qty 10

## 2023-02-28 MED ORDER — IOHEXOL 300 MG/ML  SOLN
100.0000 mL | Freq: Once | INTRAMUSCULAR | Status: AC | PRN
  Administered 2023-02-28: 100 mL via INTRAVENOUS

## 2023-02-28 MED ORDER — METOCLOPRAMIDE HCL 5 MG/ML IJ SOLN
10.0000 mg | Freq: Once | INTRAMUSCULAR | Status: AC
  Administered 2023-02-28: 10 mg via INTRAVENOUS
  Filled 2023-02-28: qty 2

## 2023-02-28 MED ORDER — ONDANSETRON HCL 4 MG/2ML IJ SOLN
4.0000 mg | Freq: Once | INTRAMUSCULAR | Status: AC | PRN
  Administered 2023-02-28: 4 mg via INTRAVENOUS
  Filled 2023-02-28: qty 2

## 2023-02-28 NOTE — ED Provider Notes (Signed)
[place] EMERGENCY DEPARTMENT AT [facility] Provider Note   CSN: [id number] Arrival date & time: 02/28/23  9629     History  Chief Complaint  Patient presents with   Vomiting    Katie Chang is a 62 y.o. female.  Pt with upper abd pain and several episodes nausea/vomiting. Emesis not bloody or bilious. Abd pain dull, upper abd, non radiating, constant, without specific exacerbating or alleviating factors.  Indicates seen at [facility] ED yesterday for same. Hx recurrent emesis/abd pain syndrome, hx THC use. No hx pud or pancreatitis. No fever or chills. No known bad food ingestion or ill contacts.   The history is provided by the patient and medical records.       Home Medications Prior to Admission medications   Medication Sig Start Date End Date Taking? Authorizing Provider  ondansetron (ZOFRAN-ODT) 8 MG disintegrating tablet Take 1 tablet (8 mg total) by mouth every 8 (eight) hours as needed for nausea or vomiting. 02/28/23  Yes Cathren Laine, MD  potassium chloride SA (KLOR-CON M) 20 MEQ tablet One po bid x 3 days, then one po once a day 02/28/23  Yes Cathren Laine, MD  albuterol (PROAIR HFA) 108 (90 BASE) MCG/ACT inhaler Inhale 2 puffs into the lungs every 6 (six) hours as needed for wheezing. 03/11/13   Sondra Barges, PA-C  amLODipine (NORVASC) 5 MG tablet Take 5 mg by mouth daily. 03/29/22   [provider]  Capsaicin-Menthol-Methyl Sal (CAPSAICIN-METHYL SAL-MENTHOL) 0.025-1-12 % CREA Apply 1 Application topically 2 (two) times daily as needed. Patient not taking: Reported on 12/25/2022 08/30/22   Tanda Rockers A, DO  celecoxib (CELEBREX) 200 MG capsule Take 200 mg by mouth daily. 05/23/22   [provider]  cholecalciferol (VITAMIN D3) 25 MCG (1000 UT) tablet Take 2,000 Units by mouth daily.    [provider]  esomeprazole (NEXIUM) 40 MG capsule Take 40 mg by mouth daily. 03/04/18   [provider]  fluticasone (FLONASE) 50 MCG/ACT nasal spray  Place 2 sprays into both nostrils daily as needed for allergies.    [provider]  levocetirizine (XYZAL) 5 MG tablet Take 5 mg by mouth every evening.    [provider]  LORazepam (ATIVAN) 1 MG tablet Take 1 mg by mouth at bedtime. 04/09/18   [provider]  magnesium hydroxide (MILK OF MAGNESIA) 400 MG/5ML suspension Take 30 mLs by mouth daily as needed for mild constipation.    [provider]  metoprolol succinate (TOPROL-XL) 25 MG 24 hr tablet Take 25 mg by mouth daily. 04/12/22   [provider]  montelukast (SINGULAIR) 10 MG tablet TAKE ONE TABLET AT BEDTIME. 04/22/14   Ethelda Chick, MD  rosuvastatin (CRESTOR) 10 MG tablet Take 10 mg by mouth as directed. Take Mon-Fri Only. 03/29/22   [provider]  sodium chloride 1 g tablet Take 1 g by mouth 2 (two) times daily with a meal.    [provider]  valsartan (DIOVAN) 80 MG tablet Take 80 mg by mouth at bedtime. 03/07/22   [provider]  venlafaxine (EFFEXOR) 37.5 MG tablet Take 112.5 mg by mouth daily. 07/31/22   [provider]      Allergies    Avelox [moxifloxacin hcl in nacl] and Haldol [haloperidol lactate]    Review of Systems   Review of Systems  Constitutional:  Negative for chills and fever.  HENT:  Negative for sore throat.   Respiratory:  Negative for cough and  shortness of breath.   Cardiovascular:  Negative for chest pain.  Gastrointestinal:  Positive for abdominal pain, nausea and vomiting.  Genitourinary:  Negative for dysuria, flank pain, hematuria, vaginal bleeding and vaginal discharge.  Musculoskeletal:  Negative for back pain and neck pain.  Skin:  Negative for rash.  Neurological:  Negative for headaches.  Hematological:  Does not bruise/bleed easily.  Psychiatric/Behavioral:  Negative for confusion.     Physical Exam Updated Vital Signs BP 127/72 (BP Location: Right Arm)   Pulse 88   Temp 98.5 F (36.9 C) (Oral)   Resp 13    Ht 1.6 m ( )   Wt 73 kg   SpO2 100%   BMI 28.51 kg/m  Physical Exam Vitals and nursing note reviewed.  Constitutional:      Appearance: Normal appearance. She is well-developed.  HENT:     Head: Atraumatic.     Nose: Nose normal.     Mouth/Throat:     Mouth: Mucous membranes are moist.     Pharynx: Oropharynx is clear.  Eyes:     General: No scleral icterus.    Conjunctiva/sclera: Conjunctivae normal.     Pupils: Pupils are equal, round, and reactive to light.  Neck:     Trachea: No tracheal deviation.  Cardiovascular:     Rate and Rhythm: Regular rhythm. Tachycardia present.     Pulses: Normal pulses.     Heart sounds: Normal heart sounds. No murmur heard.    No friction rub. No gallop.  Pulmonary:     Effort: Pulmonary effort is normal. No respiratory distress.     Breath sounds: Normal breath sounds.  Abdominal:     General: Bowel sounds are normal. There is no distension.     Palpations: Abdomen is soft.     Tenderness: There is abdominal tenderness. There is no guarding.     Comments: Mild epigastric tenderness.   Genitourinary:    Comments: No cva tenderness.  Musculoskeletal:        General: No swelling.     Cervical back: Normal range of motion and neck supple. No rigidity. No muscular tenderness.  Skin:    General: Skin is warm and dry.     Findings: No rash.  Neurological:     Mental Status: She is alert.     Comments: Alert, speech normal.   Psychiatric:        Mood and Affect: Mood normal.     ED Results / Procedures / Treatments   Labs (all labs ordered are listed, but only abnormal results are displayed) Results for orders placed or performed during the hospital encounter of 02/28/23  Lipase, blood  Result Value Ref Range   Lipase <10 (L) 11 - 51 U/L  Comprehensive metabolic panel  Result Value Ref Range   Sodium 128 (L) 135 - 145 mmol/L   Potassium 2.7 (LL) 3.5 - 5.1 mmol/L   Chloride 91 (L) 98 - 111 mmol/L   CO2 17 (L) 22 - 32  mmol/L   Glucose, Bld 153 (H) 70 - 99 mg/dL   BUN 12 8 - 23 mg/dL   Creatinine, Ser 1.61 0.44 - 1.00 mg/dL   Calcium 09.6 8.9 - 04.5 mg/dL   Total Protein 7.7 6.5 - 8.1 g/dL   Albumin 5.1 (H) 3.5 - 5.0 g/dL   AST 23 15 - 41 U/L   ALT 12 0 - 44 U/L   Alkaline Phosphatase 72 38 - 126 U/L   Total Bilirubin  1.2 0.3 - 1.2 mg/dL   GFR, Estimated >16 >10 mL/min   Anion gap 20 (H) 5 - 15  CBC  Result Value Ref Range   WBC 21.8 (H) 4.0 - 10.5 K/uL   RBC 4.96 3.87 - 5.11 MIL/uL   Hemoglobin 12.8 12.0 - 15.0 g/dL   HCT 96.0 45.4 - 09.8 %   MCV 73.6 (L) 80.0 - 100.0 fL   MCH 25.8 (L) 26.0 - 34.0 pg   MCHC 35.1 30.0 - 36.0 g/dL   RDW 11.9 14.7 - 82.9 %   Platelets 512 (H) 150 - 400 K/uL   nRBC 0.0 0.0 - 0.2 %  Urinalysis, Routine w reflex microscopic -Urine, Clean Catch  Result Value Ref Range   Color, Urine YELLOW YELLOW   APPearance CLEAR CLEAR   Specific Gravity, Urine 1.015 1.005 - 1.030   pH 6.5 5.0 - 8.0   Glucose, UA NEGATIVE NEGATIVE mg/dL   Hgb urine dipstick TRACE (A) NEGATIVE   Bilirubin Urine NEGATIVE NEGATIVE   Ketones, ur 40 (A) NEGATIVE mg/dL   Protein, ur 30 (A) NEGATIVE mg/dL   Nitrite NEGATIVE NEGATIVE   Leukocytes,Ua NEGATIVE NEGATIVE   RBC / HPF 0-5 0 - 5 RBC/hpf   WBC, UA 6-10 0 - 5 WBC/hpf   Bacteria, UA NONE SEEN NONE SEEN   Squamous Epithelial / HPF 0-5 0 - 5 /HPF   Mucus PRESENT    Hyaline Casts, UA PRESENT   Rapid urine drug screen (hospital performed)  Result Value Ref Range   Opiates POSITIVE (A) NONE DETECTED   Cocaine NONE DETECTED NONE DETECTED   Benzodiazepines NONE DETECTED NONE DETECTED   Amphetamines NONE DETECTED NONE DETECTED   Tetrahydrocannabinol POSITIVE (A) NONE DETECTED   Barbiturates NONE DETECTED NONE DETECTED   DG Chest 2 View  Result Date: 02/27/2023 CLINICAL DATA:  severe vomiting EXAM: CHEST - 2 VIEW COMPARISON:  Chest x-ray 08/11/2020 FINDINGS: The heart and mediastinal contours are within normal limits. No focal  consolidation. No pulmonary edema. No pleural effusion. No pneumothorax. No acute osseous abnormality. IMPRESSION: No active cardiopulmonary disease. Electronically Signed   By: Tish Frederickson M.D.   On: 02/27/2023 14:18    EKG EKG Interpretation  Date/Time:  Friday February 28 2023 08:27:11 EDT Ventricular Rate:  100 PR Interval:  171 QRS Duration: 95 QT Interval:  376 QTC Calculation: 485 R Axis:   40 Text Interpretation: Sinus tachycardia Left ventricular hypertrophy Confirmed by Cathren Laine ([id number]) on 02/28/2023 8:32:15 AM  Radiology DG Chest 2 View  Result Date: 02/27/2023 CLINICAL DATA:  severe vomiting EXAM: CHEST - 2 VIEW COMPARISON:  Chest x-ray 08/11/2020 FINDINGS: The heart and mediastinal contours are within normal limits. No focal consolidation. No pulmonary edema. No pleural effusion. No pneumothorax. No acute osseous abnormality. IMPRESSION: No active cardiopulmonary disease. Electronically Signed   By: Tish Frederickson M.D.   On: 02/27/2023 14:18    Procedures Procedures    Medications Ordered in ED Medications  lactated ringers bolus 1,000 mL (0 mLs Intravenous Stopped 02/28/23 0852)  HYDROmorphone (DILAUDID) injection 0.5 mg (0.5 mg Intravenous Given 02/28/23 0746)  ondansetron (ZOFRAN) injection 4 mg (4 mg Intravenous Given 02/28/23 0747)  lactated ringers bolus 1,000 mL (0 mLs Intravenous Stopped 02/28/23 1004)  potassium chloride 10 mEq in 100 mL IVPB (10 mEq Intravenous New Bag/Given 02/28/23 1003)  diazepam (VALIUM) injection 2.5 mg (2.5 mg Intravenous Given 02/28/23 0843)  pantoprazole (PROTONIX) injection 40 mg (40 mg Intravenous Given 02/28/23 0849)  HYDROmorphone (  DILAUDID) injection 0.5 mg (0.5 mg Intravenous Given 02/28/23 0945)  potassium chloride SA (KLOR-CON M) CR tablet 40 mEq (40 mEq Oral Given 02/28/23 1055)    ED Course/ Medical Decision Making/ A&P                             Medical Decision Making Problems Addressed: Cannabinoid hyperemesis  syndrome: acute illness or injury with systemic symptoms that poses a threat to life or bodily functions Dehydration: acute illness or injury with systemic symptoms that poses a threat to life or bodily functions Hypokalemia: acute illness or injury Nausea and vomiting in adult: acute illness or injury with systemic symptoms Upper abdominal pain: acute illness or injury with systemic symptoms  Amount and/or Complexity of Data Reviewed External Data Reviewed: labs, radiology and notes. Labs: ordered. Decision-making details documented in ED Course.  Risk Prescription drug management. Parenteral controlled substances. Decision regarding hospitalization.   Iv ns. Continuous pulse ox and cardiac monitoring. Labs ordered/sent.   Differential diagnosis includes CHS, viral gastroenteritis, gastritis, dehydration, etc. Dispo decision including potential need for admission considered - will get labs and reassess.   Reviewed nursing notes and prior charts for additional history. External reports reviewed.   LR bolus. Dilaudid iv, zofran iv.   Cardiac monitor: sinus rhythm, rate 110.  Labs reviewed/interpreted by me - k low, kcl iv. Recent mg normal. Wbc elev - recent, prolonged nv. No fever/chills/sweats. Hco3 low, ?dehydration, additional ivf.  THC+.  Suspect CHS.   Recheck, abd soft non tender. Afebrile. Po fluids. Po k.   Pt feels much improved and currently appears stable for d/c.  Rec pcp f/u.  Return precautions provided.                Final Clinical Impression(s) / ED Diagnoses Final diagnoses:  Nausea and vomiting in adult  Upper abdominal pain  Dehydration  Hypokalemia  Cannabinoid hyperemesis syndrome    Rx / DC Orders ED Discharge Orders          Ordered    potassium chloride SA (KLOR-CON M) 20 MEQ tablet        02/28/23 1115    ondansetron (ZOFRAN-ODT) 8 MG disintegrating tablet  Every 8 hours PRN        02/28/23 1115              Cathren Laine, MD 02/28/23 1121

## 2023-02-28 NOTE — ED Notes (Signed)
Patient transported to CT 

## 2023-02-28 NOTE — ED Triage Notes (Addendum)
Patient c/o n/v since yesterday.  Patient seen at [facility] for same yesterday and states "they discharged me when I was still hurting and vomiting."  Patient reports she has medicine at home for emesis but not for pain.  Patient reports blood in her vomit this morning.  Patient hyperventilating during triage. Patient reports smoking marijuana but reports "she's cut way back."

## 2023-02-28 NOTE — ED Notes (Addendum)
CRITICAL VALUE STICKER  CRITICAL VALUE: lactic acid 2.0 mmol/L  RECEIVER (on-site recipient of call): [place], RN  DATE & TIME NOTIFIED: 02/28/23 2044  MESSENGER (representative from lab): Myriam Jacobson  MD NOTIFIED: Dr. Jearld Fenton  TIME OF NOTIFICATION: 2045  RESPONSE: repeat lactic after fluid bolus (already ordered)

## 2023-02-28 NOTE — ED Notes (Signed)
CRITICAL VALUE STICKER  CRITICAL VALUE: potassium 2.7 mmol/L  RECEIVER (on-site recipient of call): Carlton Adam, RN  DATE & TIME NOTIFIED: 02/28/23 2016   MD NOTIFIED: Dr. Jearld Fenton  TIME OF NOTIFICATION: 2026  RESPONSE: see Mar

## 2023-02-28 NOTE — ED Provider Notes (Signed)
[place] EMERGENCY DEPARTMENT AT [facility] Provider Note   CSN: [id number] Arrival date & time: 02/28/23  1859     History  Chief Complaint  Patient presents with   Emesis    Katie Chang is a 62 y.o. female with cyclic vomiting syndrome/intractable nausea vomiting, prolonged QT syndrome, migraines, asthma, hypokalemia, cannabis use disorder who presents with emesis.   Per chart review patient was seen here this morning for nausea vomiting and emesis in the setting of known cyclic vomiting syndrome and discharged after she was able to tolerate p.o.  She re-presents to the ED now for recurrent N/V that began after drinking Ginger Ale at Home today.  She states that the symptoms feel the same as they always feel when she has cyclic vomiting however that these symptoms are the worst that they have ever been.  She reports the abdominal pain is dull located in her upper abdomen and is nonradiating and constant.  This is her third visit in the last 48 hours for the same symptoms.  She does have a history of cyclic vomiting syndrome, history of THC use.  No history of PUD or pancreatitis.  She has not had any fevers or chills.  No urinary or vaginal symptoms.    Emesis      Home Medications Prior to Admission medications   Medication Sig Start Date End Date Taking? Authorizing Provider  albuterol (PROAIR HFA) 108 (90 BASE) MCG/ACT inhaler Inhale 2 puffs into the lungs every 6 (six) hours as needed for wheezing. 03/11/13   Sondra Barges, PA-C  amLODipine (NORVASC) 5 MG tablet Take 5 mg by mouth daily. 03/29/22   [provider]  Capsaicin-Menthol-Methyl Sal (CAPSAICIN-METHYL SAL-MENTHOL) 0.025-1-12 % CREA Apply 1 Application topically 2 (two) times daily as needed. Patient not taking: Reported on 12/25/2022 08/30/22   Tanda Rockers A, DO  celecoxib (CELEBREX) 200 MG capsule Take 200 mg by mouth daily. 05/23/22   [provider]  cholecalciferol (VITAMIN D3) 25 MCG (1000  UT) tablet Take 2,000 Units by mouth daily.    [provider]  esomeprazole (NEXIUM) 40 MG capsule Take 40 mg by mouth daily. 03/04/18   [provider]  fluticasone (FLONASE) 50 MCG/ACT nasal spray Place 2 sprays into both nostrils daily as needed for allergies.    [provider]  levocetirizine (XYZAL) 5 MG tablet Take 5 mg by mouth every evening.    [provider]  LORazepam (ATIVAN) 1 MG tablet Take 1 mg by mouth at bedtime. 04/09/18   [provider]  magnesium hydroxide (MILK OF MAGNESIA) 400 MG/5ML suspension Take 30 mLs by mouth daily as needed for mild constipation.    [provider]  metoprolol succinate (TOPROL-XL) 25 MG 24 hr tablet Take 25 mg by mouth daily. 04/12/22   [provider]  montelukast (SINGULAIR) 10 MG tablet TAKE ONE TABLET AT BEDTIME. 04/22/14   Ethelda Chick, MD  ondansetron (ZOFRAN-ODT) 8 MG disintegrating tablet Take 1 tablet (8 mg total) by mouth every 8 (eight) hours as needed for nausea or vomiting. 02/28/23   Cathren Laine, MD  potassium chloride SA (KLOR-CON M) 20 MEQ tablet One po bid x 3 days, then one po once a day 02/28/23   Cathren Laine, MD  rosuvastatin (CRESTOR) 10 MG tablet Take 10 mg by mouth as directed. Take Mon-Fri Only. 03/29/22   [provider]  sodium chloride 1 g tablet Take 1 g by mouth 2 (two) times daily  with a meal.    [provider]  valsartan (DIOVAN) 80 MG tablet Take 80 mg by mouth at bedtime. 03/07/22   [provider]  venlafaxine (EFFEXOR) 37.5 MG tablet Take 112.5 mg by mouth daily. 07/31/22   [provider]      Allergies    Avelox [moxifloxacin hcl in nacl] and Haldol [haloperidol lactate]    Review of Systems   Review of Systems  Gastrointestinal:  Positive for vomiting.   Review of systems Negative for f/c.  A 10 point review of systems was performed and is negative unless otherwise reported in HPI.  Physical Exam Updated  Vital Signs BP (!) 176/97   Pulse 88   Temp 99.6 F (37.6 C) (Oral)   Resp 20   Ht 5\' 3"  (1.6 m)   Wt 73 kg   SpO2 99%   BMI 28.51 kg/m  Physical Exam General: Extremely uncomfortable-appearing female, lying in bed.  HEENT: PERRLA, Sclera anicteric, MMM, trachea midline.  Cardiology: Tachycardic regular rate, no murmurs/rubs/gallops. BL radial and DP pulses equal bilaterally.  Resp: Hyperventilating which resolves with coaching. CTAB, no wheezes, rhonchi, crackles.  Abd: Diffusely mildly TTP. Soft, non-distended. No rebound tenderness or guarding.  GU: Deferred. MSK: No peripheral edema or signs of trauma. Extremities without deformity or TTP. No cyanosis or clubbing. Skin: warm, dry. No rashes or lesions. Back: No CVA tenderness Neuro: A&Ox4, CNs II-XII grossly intact. MAEs. Sensation grossly intact.  Psych: anxious/tearful affect  ED Results / Procedures / Treatments   Labs (all labs ordered are listed, but only abnormal results are displayed) Labs Reviewed  LIPASE, BLOOD - Abnormal; Notable for the following components:      Result Value   Lipase <10 (*)    All other components within normal limits  COMPREHENSIVE METABOLIC PANEL - Abnormal; Notable for the following components:   Sodium 128 (*)    Potassium 2.7 (*)    Chloride 93 (*)    CO2 17 (*)    Glucose, Bld 130 (*)    BUN 7 (*)    Anion gap 18 (*)    All other components within normal limits  CBC - Abnormal; Notable for the following components:   WBC 20.3 (*)    HCT 34.4 (*)    MCV 73.0 (*)    MCH 25.5 (*)    Platelets 489 (*)    All other components within normal limits  LACTIC ACID, PLASMA - Abnormal; Notable for the following components:   Lactic Acid, Venous 2.0 (*)    All other components within normal limits  LACTIC ACID, PLASMA    EKG EKG Interpretation  Date/Time:  Friday February 28 2023 19:18:11 EDT Ventricular Rate:  124 PR Interval:  154 QRS Duration: 84 QT Interval:  316 QTC  Calculation: 453 R Axis:   55 Text Interpretation: Sinus tachycardia Possible Left atrial enlargement Left ventricular hypertrophy with repolarization abnormality ( Sokolow-Lyon ) Abnormal ECG When compared with ECG of 28-Feb-2023 08:27, PREVIOUS ECG IS PRESENT Confirmed by Vivi Barrack [phone]) on 02/28/2023 7:57:44 PM  Radiology CT ABDOMEN PELVIS W CONTRAST  Result Date: 02/28/2023 CLINICAL DATA:  Nausea, vomiting, upper abdominal pain since last night. Bowel obstruction suspected. EXAM: CT ABDOMEN AND PELVIS WITH CONTRAST TECHNIQUE: Multidetector CT imaging of the abdomen and pelvis was performed using the standard protocol following bolus administration of intravenous contrast. RADIATION DOSE REDUCTION: This exam was performed according to the departmental dose-optimization program which includes automated exposure control, adjustment of  the mA and/or kV according to patient size and/or use of iterative reconstruction technique. CONTRAST:  OMNIPAQUE IOHEXOL 300 MG/ML  SOLN COMPARISON:  CT 04/15/2022 FINDINGS: Lower chest: Partially visualized patchy ground-glass opacity in the anterior right middle lobe. Hepatobiliary: Hepatic steatosis. Unremarkable gallbladder and biliary tree. Pancreas: Unremarkable. Spleen: Unremarkable. Adrenals/Urinary Tract: Normal adrenal glands. No urinary calculi or hydronephrosis. Distended bladder. Stomach/Bowel: Normal caliber large and small bowel. No bowel wall thickening. The appendix is not definitively visualized. No secondary signs of appendicitis. Stomach is within normal limits. Vascular/Lymphatic: Aortic atherosclerosis. No enlarged abdominal or pelvic lymph nodes. Reproductive: Calcified fibroid in the uterus.  No adnexal mass. Other: No free intraperitoneal fluid or air. Musculoskeletal: Posterior fusion L4-L5 with interbody spacer. Thoracolumbar spondylosis. No acute osseous abnormality. IMPRESSION: 1. No acute findings in the abdomen or pelvis. 2. Partially  visualized patchy ground-glass opacity in the anterior right middle lobe suggestive of atypical infection. 3. Hepatic steatosis. 4. Fibroid uterus. Aortic Atherosclerosis (ICD10-I70.0). Electronically Signed   By: Minerva Fester M.D.   On: 02/28/2023 22:16   DG Chest 2 View  Result Date: 02/27/2023 CLINICAL DATA:  severe vomiting EXAM: CHEST - 2 VIEW COMPARISON:  Chest x-ray 08/11/2020 FINDINGS: The heart and mediastinal contours are within normal limits. No focal consolidation. No pulmonary edema. No pleural effusion. No pneumothorax. No acute osseous abnormality. IMPRESSION: No active cardiopulmonary disease. Electronically Signed   By: Tish Frederickson M.D.   On: 02/27/2023 14:18    Procedures Procedures    Medications Ordered in ED Medications  potassium chloride 10 mEq in 100 mL IVPB (10 mEq Intravenous New Bag/Given 02/28/23 2331)  ondansetron (ZOFRAN) injection 4 mg (4 mg Intravenous Given 02/28/23 1925)  lactated ringers bolus 1,000 mL (0 mLs Intravenous Stopped 02/28/23 2135)  diazepam (VALIUM) injection 2.5 mg (2.5 mg Intravenous Given 02/28/23 2023)  ondansetron (ZOFRAN) injection 4 mg (4 mg Intravenous Given 02/28/23 2019)  HYDROmorphone (DILAUDID) injection 0.5 mg (0.5 mg Intravenous Given 02/28/23 2021)  HYDROmorphone (DILAUDID) injection 0.5 mg (0.5 mg Intravenous Given 02/28/23 2134)  diazepam (VALIUM) injection 2.5 mg (2.5 mg Intravenous Given 02/28/23 2129)  metoCLOPramide (REGLAN) injection 10 mg (10 mg Intravenous Given 02/28/23 2127)  iohexol (OMNIPAQUE) 300 MG/ML solution 100 mL (100 mLs Intravenous Contrast Given 02/28/23 2151)    ED Course/ Medical Decision Making/ A&P                          Medical Decision Making Amount and/or Complexity of Data Reviewed Labs: ordered. Decision-making details documented in ED Course. Radiology: ordered. Decision-making details documented in ED Course.  Risk Prescription drug management.    This patient presents to the ED for  concern of intractable nausea vomiting, this involves an extensive number of treatment options, and is a complaint that carries with it a high risk of complications and morbidity.  I considered the following differential and admission for this acute, potentially life threatening condition.  Patient is overall very uncomfortable appearing and her hyperventilation decreases with coaching.  MDM:    For DDX for intractable nausea vomiting abdominal pain includes but is not limited to:  Abdominal exam without peritoneal signs. No evidence of acute abdomen at this time.   Given that patient presented for the third time in the last 48 hours with recurrent nausea vomiting, she states that her symptoms are the worst they have ever been and she has not received any imaging in the last several visits so we  will obtain CT of the pelvis at this encounter.  Likely this is a continuation of her cyclic vomiting syndrome.  Will consider other causes of her abdominal pain such as small bowel obstruction, acute pancreatitis, vascular catastrophe.  She has no chest pain or hematemesis indicate esophageal injury.  She had hypokalemia earlier will reevaluate for electrolyte derangements today.    Leukocytosis decreasing from earlier.  Hypokalemia will replete IV given cannot take p.o.  Anion gap elevated indicating dehydration.  Replating with fluids.  Will treat patient symptomatically based on chart review as she has felt better at her prior visits and will reassess.   Clinical Course as of 02/28/23 2344  Fri Feb 28, 2023  1957 WBC(!): 20.3 Decreased mildly from earlier [HN]  2119 Potassium(!!): 2.7 Repleting IV given cannot take PO currently [HN]  2119 Lactic Acid, Venous(!!): 2.0 Receiving fluids [HN]  2119 Lipase(!): <10 [HN]  2119 Glucose(!): 130 [HN]  2119 Anion gap(!): 18 [HN]  2119 Patient had improvement with valium 2.5 mg IV, zofran 4 mg IV, and dilaudid 0.5 mg IV for about 1 hour. However now she is in  recurrent pain, N/V, and hyperventilating.  [HN]  2219 CT ABDOMEN PELVIS W CONTRAST 1. No acute findings in the abdomen or pelvis. 2. Partially visualized patchy ground-glass opacity in the anterior right middle lobe suggestive of atypical infection. 3. Hepatic steatosis. 4. Fibroid uterus.   [HN]  2219 Imaging with no acute findings in the abdomen pelvis.  Possible right middle lobe pneumonia. [HN]  2256 Patient reevaluated, feels improved. Still pending repeat lactate and potassium repletion. States she would like to try to go home if possible. [HN]    Clinical Course User Index [HN] Loetta Rough, MD    Labs: I Ordered, and personally interpreted labs.  The pertinent results include: Those listed above  Imaging Studies ordered: I ordered imaging studies including CT abdomen pelvis I independently visualized and interpreted imaging. I agree with the radiologist interpretation  Additional history obtained from chart review.    Cardiac Monitoring: The patient was maintained on a cardiac monitor.  I personally viewed and interpreted the cardiac monitored which showed an underlying rhythm of: Sinus tachycardia and normal sinus rhythm  Reevaluation: After the interventions noted above, I reevaluated the patient and found that they have :improved  Social Determinants of Health: Patient lives independently   Disposition:  Patient is signed out to the oncoming ED physician Dr Jacqulyn Bath who is made aware of her history, presentation, exam, workup, and plan. Plan is for repeat lactate, K repletion, PO challenge and likely DC patient.    Co morbidities that complicate the patient evaluation  Past Medical History:  Diagnosis Date   Allergy    Arthritis    Asthma    Cervical dysplasia    Cyclic vomiting syndrome    DDD (degenerative disc disease), cervical    Cervical and Lumbar   Depression    Eczema    GERD (gastroesophageal reflux disease)    Hypertension    Menopausal  symptoms    Migraines    PONV (postoperative nausea and vomiting)    sometimes nausea   Recurrent sinus infections      Medicines Meds ordered this encounter  Medications   ondansetron (ZOFRAN) injection 4 mg   lactated ringers bolus 1,000 mL   diazepam (VALIUM) injection 2.5 mg   ondansetron (ZOFRAN) injection 4 mg   HYDROmorphone (DILAUDID) injection 0.5 mg   potassium chloride 10 mEq in 100  mL IVPB   HYDROmorphone (DILAUDID) injection 0.5 mg   diazepam (VALIUM) injection 2.5 mg   metoCLOPramide (REGLAN) injection 10 mg   iohexol (OMNIPAQUE) 300 MG/ML solution 100 mL    I have reviewed the patients home medicines and have made adjustments as needed  Problem List / ED Course: Problem List Items Addressed This Visit       Other   Hypokalemia   Dehydration   Other Visit Diagnoses     Nausea and vomiting, unspecified vomiting type    -  Primary                   This note was created using dictation software, which may contain spelling or grammatical errors.    Loetta Rough, MD 02/28/23 [phone]

## 2023-02-28 NOTE — ED Notes (Signed)
Patient verbalizes understanding of discharge instructions. Opportunity for questioning and answers were provided. Patient discharged from ED.  °

## 2023-02-28 NOTE — ED Triage Notes (Signed)
Patient here POV from Home.  Endorses being seen in ED today 12 Hours ago for N/V. Discharged after she was treated and able to tolerate PO Fluids. Seeks Evaluation now for returned N/V that began after drinking Ginger Ale at Home today.  Hyperventilating during Triage. A&Ox4. GCS 15. BIB Wheelchair.

## 2023-02-28 NOTE — ED Notes (Signed)
Pt endorses return of pain, nausea, and anxiety, continues to hyperventilate. EDP made aware and is at bedside

## 2023-02-28 NOTE — ED Notes (Signed)
RT educated pt on smoking cessation. Pt was placed on oxygen initially due to low sats post medication administration. Pt has a pulmonary hx of asthma controlled. Pt on [place] 2 Lpm at this time w/BLBS clr/dim, no distress noted at this time. Pt appears to be very anxious.

## 2023-02-28 NOTE — ED Notes (Signed)
ED Provider at bedside. 

## 2023-02-28 NOTE — Discharge Instructions (Addendum)
It was our pleasure to provide your ER care today - we hope that you feel better.  Drink plenty of fluids/stay well hydrated. Continue nexium. Take zofran as need for nausea.   Take acetaminophen or ibuprofen as need. Take zofran as need for nausea. From today's labs, your potassium level is low - eat plenty of fruits and vegetables, take potassium supplement as prescribed, and follow up with primary care doctor in one week.   Note that increasingly we are seeing a recurrent abdominal pain and/or vomiting syndrome called Cannabinoid Hyperemesis Syndrome - see attached info - in these cases avoiding marijuana use will prevent symptoms from recurrent (note that symptoms can persist for a few weeks if history of heavy marijuana use as it can take time to get out of system).   Return to ER right away  if worse, fevers, new symptoms, new or worsening or severe abdominal pain, persistent vomiting, chest pain, trouble breathing, or other emergency concern.  You were given pain meds in the ER - no driving for the next 8 hours.

## 2023-03-01 ENCOUNTER — Encounter (HOSPITAL_BASED_OUTPATIENT_CLINIC_OR_DEPARTMENT_OTHER): Payer: Self-pay | Admitting: Emergency Medicine

## 2023-03-01 ENCOUNTER — Inpatient Hospital Stay (HOSPITAL_BASED_OUTPATIENT_CLINIC_OR_DEPARTMENT_OTHER)
Admission: EM | Admit: 2023-03-01 | Discharge: 2023-03-03 | DRG: 393 | Disposition: A | Discharge status: Home / Self Care | Payer: BC Managed Care – PPO | Attending: Family Medicine | Admitting: Family Medicine

## 2023-03-01 ENCOUNTER — Emergency Department (HOSPITAL_BASED_OUTPATIENT_CLINIC_OR_DEPARTMENT_OTHER): Payer: BC Managed Care – PPO

## 2023-03-01 DIAGNOSIS — Z8249 Family history of ischemic heart disease and other diseases of the circulatory system: Secondary | ICD-10-CM | POA: Diagnosis not present

## 2023-03-01 DIAGNOSIS — J189 Pneumonia, unspecified organism: Secondary | ICD-10-CM | POA: Diagnosis present

## 2023-03-01 DIAGNOSIS — F1210 Cannabis abuse, uncomplicated: Secondary | ICD-10-CM | POA: Diagnosis present

## 2023-03-01 DIAGNOSIS — G43909 Migraine, unspecified, not intractable, without status migrainosus: Secondary | ICD-10-CM | POA: Diagnosis present

## 2023-03-01 DIAGNOSIS — Z66 Do not resuscitate: Secondary | ICD-10-CM | POA: Diagnosis present

## 2023-03-01 DIAGNOSIS — R1115 Cyclical vomiting syndrome unrelated to migraine: Secondary | ICD-10-CM | POA: Diagnosis present

## 2023-03-01 DIAGNOSIS — Z881 Allergy status to other antibiotic agents status: Secondary | ICD-10-CM

## 2023-03-01 DIAGNOSIS — Z833 Family history of diabetes mellitus: Secondary | ICD-10-CM

## 2023-03-01 DIAGNOSIS — F1290 Cannabis use, unspecified, uncomplicated: Secondary | ICD-10-CM

## 2023-03-01 DIAGNOSIS — Z1152 Encounter for screening for COVID-19: Secondary | ICD-10-CM

## 2023-03-01 DIAGNOSIS — K920 Hematemesis: Secondary | ICD-10-CM | POA: Diagnosis not present

## 2023-03-01 DIAGNOSIS — R9431 Abnormal electrocardiogram [ECG] [EKG]: Secondary | ICD-10-CM | POA: Diagnosis present

## 2023-03-01 DIAGNOSIS — E876 Hypokalemia: Secondary | ICD-10-CM | POA: Diagnosis present

## 2023-03-01 DIAGNOSIS — E871 Hypo-osmolality and hyponatremia: Secondary | ICD-10-CM | POA: Diagnosis present

## 2023-03-01 DIAGNOSIS — I10 Essential (primary) hypertension: Secondary | ICD-10-CM | POA: Diagnosis present

## 2023-03-01 DIAGNOSIS — Z720 Tobacco use: Secondary | ICD-10-CM | POA: Diagnosis present

## 2023-03-01 DIAGNOSIS — Z888 Allergy status to other drugs, medicaments and biological substances status: Secondary | ICD-10-CM | POA: Diagnosis not present

## 2023-03-01 DIAGNOSIS — K2970 Gastritis, unspecified, without bleeding: Secondary | ICD-10-CM | POA: Diagnosis present

## 2023-03-01 DIAGNOSIS — E860 Dehydration: Secondary | ICD-10-CM | POA: Diagnosis present

## 2023-03-01 DIAGNOSIS — E861 Hypovolemia: Secondary | ICD-10-CM | POA: Diagnosis present

## 2023-03-01 DIAGNOSIS — Z803 Family history of malignant neoplasm of breast: Secondary | ICD-10-CM

## 2023-03-01 DIAGNOSIS — Z791 Long term (current) use of non-steroidal anti-inflammatories (NSAID): Secondary | ICD-10-CM

## 2023-03-01 DIAGNOSIS — E785 Hyperlipidemia, unspecified: Secondary | ICD-10-CM | POA: Diagnosis present

## 2023-03-01 DIAGNOSIS — K760 Fatty (change of) liver, not elsewhere classified: Secondary | ICD-10-CM | POA: Diagnosis present

## 2023-03-01 DIAGNOSIS — F32A Depression, unspecified: Secondary | ICD-10-CM | POA: Diagnosis present

## 2023-03-01 DIAGNOSIS — G8929 Other chronic pain: Secondary | ICD-10-CM | POA: Diagnosis present

## 2023-03-01 DIAGNOSIS — Z79899 Other long term (current) drug therapy: Secondary | ICD-10-CM | POA: Diagnosis not present

## 2023-03-01 DIAGNOSIS — Z823 Family history of stroke: Secondary | ICD-10-CM

## 2023-03-01 DIAGNOSIS — F17210 Nicotine dependence, cigarettes, uncomplicated: Secondary | ICD-10-CM | POA: Diagnosis present

## 2023-03-01 DIAGNOSIS — R1110 Vomiting, unspecified: Principal | ICD-10-CM

## 2023-03-01 DIAGNOSIS — K219 Gastro-esophageal reflux disease without esophagitis: Secondary | ICD-10-CM | POA: Diagnosis present

## 2023-03-01 DIAGNOSIS — E878 Other disorders of electrolyte and fluid balance, not elsewhere classified: Secondary | ICD-10-CM | POA: Diagnosis present

## 2023-03-01 DIAGNOSIS — J45909 Unspecified asthma, uncomplicated: Secondary | ICD-10-CM | POA: Diagnosis present

## 2023-03-01 DIAGNOSIS — F419 Anxiety disorder, unspecified: Secondary | ICD-10-CM | POA: Diagnosis present

## 2023-03-01 DIAGNOSIS — K449 Diaphragmatic hernia without obstruction or gangrene: Secondary | ICD-10-CM | POA: Diagnosis present

## 2023-03-01 LAB — COMPREHENSIVE METABOLIC PANEL
ALT: 18 U/L (ref 0–44)
AST: 34 U/L (ref 15–41)
Albumin: 4.8 g/dL (ref 3.5–5.0)
Alkaline Phosphatase: 72 U/L (ref 38–126)
Anion gap: 17 — ABNORMAL HIGH (ref 5–15)
BUN: 7 mg/dL — ABNORMAL LOW (ref 8–23)
CO2: 18 mmol/L — ABNORMAL LOW (ref 22–32)
Calcium: 9.9 mg/dL (ref 8.9–10.3)
Chloride: 88 mmol/L — ABNORMAL LOW (ref 98–111)
Creatinine, Ser: 0.71 mg/dL (ref 0.44–1.00)
GFR, Estimated: >60 mL/min (ref >60)
Glucose, Bld: 122 mg/dL — ABNORMAL HIGH (ref 70–99)
Potassium: 2.7 mmol/L — CL (ref 3.5–5.1)
Sodium: 123 mmol/L — ABNORMAL LOW (ref 135–145)
Total Bilirubin: 1.2 mg/dL (ref 0.3–1.2)
Total Protein: 7.4 g/dL (ref 6.5–8.1)

## 2023-03-01 LAB — TROPONIN I (HIGH SENSITIVITY): Troponin I (High Sensitivity): 21 ng/L — ABNORMAL HIGH (ref <18)

## 2023-03-01 LAB — LACTIC ACID, PLASMA: Lactic Acid, Venous: 0.9 mmol/L (ref 0.5–1.9)

## 2023-03-01 LAB — PROCALCITONIN: Procalcitonin: <0.1 ng/mL

## 2023-03-01 LAB — BASIC METABOLIC PANEL
Anion gap: 13 (ref 5–15)
BUN: 7 mg/dL — ABNORMAL LOW (ref 8–23)
CO2: 19 mmol/L — ABNORMAL LOW (ref 22–32)
Calcium: 8.9 mg/dL (ref 8.9–10.3)
Chloride: 91 mmol/L — ABNORMAL LOW (ref 98–111)
Creatinine, Ser: 0.67 mg/dL (ref 0.44–1.00)
GFR, Estimated: >60 mL/min (ref >60)
Glucose, Bld: 114 mg/dL — ABNORMAL HIGH (ref 70–99)
Potassium: 2.8 mmol/L — ABNORMAL LOW (ref 3.5–5.1)
Sodium: 123 mmol/L — ABNORMAL LOW (ref 135–145)

## 2023-03-01 LAB — NA AND K (SODIUM & POTASSIUM), RAND UR
Potassium Urine: 19 mmol/L
Sodium, Ur: 82 mmol/L

## 2023-03-01 LAB — CBC WITH DIFFERENTIAL/PLATELET
Abs Immature Granulocytes: 0.11 10*3/uL — ABNORMAL HIGH (ref 0.00–0.07)
Basophils Absolute: 0.1 10*3/uL (ref 0.0–0.1)
Basophils Relative: 0 %
Eosinophils Absolute: 0.1 10*3/uL (ref 0.0–0.5)
Eosinophils Relative: 1 %
HCT: 34 % — ABNORMAL LOW (ref 36.0–46.0)
Hemoglobin: 12 g/dL (ref 12.0–15.0)
Immature Granulocytes: 1 %
Lymphocytes Relative: 23 %
Lymphs Abs: 3.9 10*3/uL (ref 0.7–4.0)
MCH: 25.7 pg — ABNORMAL LOW (ref 26.0–34.0)
MCHC: 35.3 g/dL (ref 30.0–36.0)
MCV: 72.8 fL — ABNORMAL LOW (ref 80.0–100.0)
Monocytes Absolute: 1.4 10*3/uL — ABNORMAL HIGH (ref 0.1–1.0)
Monocytes Relative: 8 %
Neutro Abs: 11.8 10*3/uL — ABNORMAL HIGH (ref 1.7–7.7)
Neutrophils Relative %: 67 %
Platelets: 454 10*3/uL — ABNORMAL HIGH (ref 150–400)
RBC: 4.67 MIL/uL (ref 3.87–5.11)
RDW: 15.2 % (ref 11.5–15.5)
WBC: 17.5 10*3/uL — ABNORMAL HIGH (ref 4.0–10.5)
nRBC: 0 % (ref 0.0–0.2)

## 2023-03-01 LAB — MAGNESIUM
Magnesium: 1.2 mg/dL — ABNORMAL LOW (ref 1.7–2.4)
Magnesium: 2.1 mg/dL (ref 1.7–2.4)

## 2023-03-01 LAB — CK: Total CK: 610 U/L — ABNORMAL HIGH (ref 38–234)

## 2023-03-01 LAB — OSMOLALITY, URINE: Osmolality, Ur: 231 mOsm/kg — ABNORMAL LOW (ref 300–900)

## 2023-03-01 LAB — SARS CORONAVIRUS 2 BY RT PCR: SARS Coronavirus 2 by RT PCR: NEGATIVE

## 2023-03-01 LAB — OSMOLALITY: Osmolality: 258 mOsm/kg — ABNORMAL LOW (ref 275–295)

## 2023-03-01 LAB — PHOSPHORUS: Phosphorus: 2.8 mg/dL (ref 2.5–4.6)

## 2023-03-01 LAB — TSH: TSH: 1.187 u[IU]/mL (ref 0.350–4.500)

## 2023-03-01 LAB — LIPASE, BLOOD: Lipase: <10 U/L — ABNORMAL LOW (ref 11–51)

## 2023-03-01 MED ORDER — DICYCLOMINE HCL 10 MG PO CAPS
10.0000 mg | ORAL_CAPSULE | Freq: Once | ORAL | Status: AC
  Administered 2023-03-01: 10 mg via ORAL
  Filled 2023-03-01: qty 1

## 2023-03-01 MED ORDER — DIPHENHYDRAMINE HCL 50 MG/ML IJ SOLN
12.5000 mg | Freq: Once | INTRAMUSCULAR | Status: AC
  Administered 2023-03-01: 12.5 mg via INTRAVENOUS
  Filled 2023-03-01: qty 1

## 2023-03-01 MED ORDER — ACETAMINOPHEN 650 MG RE SUPP: 650.0000 mg | Freq: Four times a day (QID) | RECTAL | Status: DC | PRN

## 2023-03-01 MED ORDER — LACTATED RINGERS IV BOLUS
1000.0000 mL | Freq: Once | INTRAVENOUS | Status: AC
  Administered 2023-03-01: 1000 mL via INTRAVENOUS

## 2023-03-01 MED ORDER — ACETAMINOPHEN 325 MG PO TABS: 650.0000 mg | ORAL_TABLET | Freq: Four times a day (QID) | ORAL | Status: DC | PRN

## 2023-03-01 MED ORDER — PANTOPRAZOLE SODIUM 40 MG IV SOLR
40.0000 mg | Freq: Once | INTRAVENOUS | Status: AC
  Administered 2023-03-01: 40 mg via INTRAVENOUS
  Filled 2023-03-01: qty 10

## 2023-03-01 MED ORDER — MONTELUKAST SODIUM 10 MG PO TABS
10.0000 mg | ORAL_TABLET | Freq: Every day | ORAL | Status: DC
  Administered 2023-03-01 – 2023-03-02 (×2): 10 mg via ORAL
  Filled 2023-03-01 (×2): qty 1

## 2023-03-01 MED ORDER — PANTOPRAZOLE SODIUM 40 MG IV SOLR
40.0000 mg | Freq: Two times a day (BID) | INTRAVENOUS | Status: DC
  Administered 2023-03-01 – 2023-03-02 (×3): 40 mg via INTRAVENOUS
  Filled 2023-03-01 (×3): qty 10

## 2023-03-01 MED ORDER — METOPROLOL SUCCINATE ER 50 MG PO TB24
25.0000 mg | ORAL_TABLET | Freq: Every day | ORAL | Status: DC
  Administered 2023-03-02 – 2023-03-03 (×2): 25 mg via ORAL
  Filled 2023-03-01 (×2): qty 1

## 2023-03-01 MED ORDER — POTASSIUM CHLORIDE 10 MEQ/100ML IV SOLN
10.0000 meq | INTRAVENOUS | Status: AC
  Administered 2023-03-01 (×2): 10 meq via INTRAVENOUS
  Filled 2023-03-01: qty 100

## 2023-03-01 MED ORDER — NICOTINE 21 MG/24HR TD PT24
21.0000 mg | MEDICATED_PATCH | Freq: Every day | TRANSDERMAL | Status: DC
  Administered 2023-03-01 – 2023-03-03 (×3): 21 mg via TRANSDERMAL
  Filled 2023-03-01 (×3): qty 1

## 2023-03-01 MED ORDER — LIDOCAINE 5 % EX PTCH
1.0000 | MEDICATED_PATCH | CUTANEOUS | Status: DC
  Administered 2023-03-01 – 2023-03-02 (×2): 1 via TRANSDERMAL
  Filled 2023-03-01 (×3): qty 1

## 2023-03-01 MED ORDER — ALUM & MAG HYDROXIDE-SIMETH 200-200-20 MG/5ML PO SUSP
30.0000 mL | Freq: Once | ORAL | Status: AC
  Administered 2023-03-01: 30 mL via ORAL
  Filled 2023-03-01: qty 30

## 2023-03-01 MED ORDER — ROSUVASTATIN CALCIUM 10 MG PO TABS
10.0000 mg | ORAL_TABLET | ORAL | Status: DC
  Administered 2023-03-03: 10 mg via ORAL
  Filled 2023-03-01: qty 1

## 2023-03-01 MED ORDER — DIAZEPAM 5 MG/ML IJ SOLN
2.5000 mg | Freq: Once | INTRAMUSCULAR | Status: AC | PRN
  Administered 2023-03-01: 2.5 mg via INTRAVENOUS
  Filled 2023-03-01: qty 2

## 2023-03-01 MED ORDER — POTASSIUM CHLORIDE 10 MEQ/100ML IV SOLN
10.0000 meq | INTRAVENOUS | Status: AC
  Administered 2023-03-01 – 2023-03-02 (×3): 10 meq via INTRAVENOUS
  Filled 2023-03-01 (×4): qty 100

## 2023-03-01 MED ORDER — SODIUM CHLORIDE 0.9 % IV SOLN: 500.0000 mg | INTRAVENOUS | Status: DC

## 2023-03-01 MED ORDER — FENTANYL CITRATE PF 50 MCG/ML IJ SOSY: 12.5000 ug | PREFILLED_SYRINGE | INTRAMUSCULAR | Status: DC | PRN

## 2023-03-01 MED ORDER — IRBESARTAN 150 MG PO TABS
75.0000 mg | ORAL_TABLET | Freq: Every day | ORAL | Status: DC
  Administered 2023-03-01 – 2023-03-02 (×2): 75 mg via ORAL
  Filled 2023-03-01 (×2): qty 1

## 2023-03-01 MED ORDER — SODIUM CHLORIDE 0.9 % IV SOLN: Freq: Once | INTRAVENOUS | Status: AC

## 2023-03-01 MED ORDER — POTASSIUM CHLORIDE 20 MEQ PO PACK
80.0000 meq | PACK | Freq: Once | ORAL | Status: AC
  Administered 2023-03-01: 80 meq via ORAL
  Filled 2023-03-01: qty 4

## 2023-03-01 MED ORDER — PANTOPRAZOLE SODIUM 40 MG IV SOLR: 40.0000 mg | Freq: Two times a day (BID) | INTRAVENOUS | Status: DC

## 2023-03-01 MED ORDER — HYDROCODONE-ACETAMINOPHEN 5-325 MG PO TABS
1.0000 | ORAL_TABLET | ORAL | Status: DC | PRN
  Administered 2023-03-01 – 2023-03-02 (×4): 2 via ORAL
  Administered 2023-03-03: 1 via ORAL
  Filled 2023-03-01 (×4): qty 2
  Filled 2023-03-01: qty 1

## 2023-03-01 MED ORDER — SODIUM CHLORIDE 0.9 % IV SOLN
2.0000 g | INTRAVENOUS | Status: DC
  Administered 2023-03-01 – 2023-03-02 (×2): 2 g via INTRAVENOUS
  Filled 2023-03-01 (×2): qty 20

## 2023-03-01 MED ORDER — SODIUM CHLORIDE 0.9 % IV SOLN: INTRAVENOUS | Status: AC

## 2023-03-01 MED ORDER — CAPSAICIN 0.025 % EX CREA
TOPICAL_CREAM | Freq: Once | CUTANEOUS | Status: DC
  Filled 2023-03-01: qty 60

## 2023-03-01 MED ORDER — METOCLOPRAMIDE HCL 5 MG/ML IJ SOLN
5.0000 mg | Freq: Four times a day (QID) | INTRAMUSCULAR | Status: DC
  Administered 2023-03-01 – 2023-03-03 (×6): 5 mg via INTRAVENOUS
  Filled 2023-03-01 (×7): qty 2

## 2023-03-01 MED ORDER — DIAZEPAM 5 MG/ML IJ SOLN
2.5000 mg | Freq: Once | INTRAMUSCULAR | Status: AC
  Administered 2023-03-01: 2.5 mg via INTRAVENOUS
  Filled 2023-03-01: qty 2

## 2023-03-01 MED ORDER — METOCLOPRAMIDE HCL 10 MG PO TABS: 10.0000 mg | ORAL_TABLET | Freq: Four times a day (QID) | ORAL | 0 refills | Status: DC

## 2023-03-01 MED ORDER — METOCLOPRAMIDE HCL 5 MG/ML IJ SOLN
10.0000 mg | Freq: Once | INTRAMUSCULAR | Status: AC
  Administered 2023-03-01: 10 mg via INTRAVENOUS
  Filled 2023-03-01: qty 2

## 2023-03-01 MED ORDER — POTASSIUM CHLORIDE 10 MEQ/100ML IV SOLN
10.0000 meq | Freq: Once | INTRAVENOUS | Status: AC
  Administered 2023-03-01: 10 meq via INTRAVENOUS
  Filled 2023-03-01: qty 100

## 2023-03-01 MED ORDER — MAGNESIUM SULFATE 2 GM/50ML IV SOLN
2.0000 g | Freq: Once | INTRAVENOUS | Status: AC
  Administered 2023-03-01: 2 g via INTRAVENOUS
  Filled 2023-03-01: qty 50

## 2023-03-01 MED ORDER — SODIUM CHLORIDE 0.9 % IV SOLN
100.0000 mg | Freq: Two times a day (BID) | INTRAVENOUS | Status: DC
  Administered 2023-03-01 – 2023-03-03 (×4): 100 mg via INTRAVENOUS
  Filled 2023-03-01 (×4): qty 100

## 2023-03-01 NOTE — Assessment & Plan Note (Addendum)
In the setting of decreased p.o. intake and increased water and ginger ale intake gently rehydrate reduce free water drinking Obtain electrolytes and monitor frequent labs Currently symptomatic patient states she chronically has hyponatremia Check TSH

## 2023-03-01 NOTE — Assessment & Plan Note (Signed)
Replace and continue to monitor recheck magnesium level

## 2023-03-01 NOTE — ED Provider Notes (Signed)
[place] EMERGENCY DEPARTMENT AT [facility] Provider Note   CSN: [id number] Arrival date & time: 03/01/23  1250     History  No chief complaint on file.   Katie Chang is a 62 y.o. female.  HPI     Home Medications Prior to Admission medications   Medication Sig Start Date End Date Taking? Authorizing Provider  albuterol (PROAIR HFA) 108 (90 BASE) MCG/ACT inhaler Inhale 2 puffs into the lungs every 6 (six) hours as needed for wheezing. 03/11/13   Sondra Barges, PA-C  amLODipine (NORVASC) 5 MG tablet Take 5 mg by mouth daily. 03/29/22   [provider]  Capsaicin-Menthol-Methyl Sal (CAPSAICIN-METHYL SAL-MENTHOL) 0.025-1-12 % CREA Apply 1 Application topically 2 (two) times daily as needed. Patient not taking: Reported on 12/25/2022 08/30/22   Tanda Rockers A, DO  celecoxib (CELEBREX) 200 MG capsule Take 200 mg by mouth daily. 05/23/22   [provider]  cholecalciferol (VITAMIN D3) 25 MCG (1000 UT) tablet Take 2,000 Units by mouth daily.    [provider]  esomeprazole (NEXIUM) 40 MG capsule Take 40 mg by mouth daily. 03/04/18   [provider]  fluticasone (FLONASE) 50 MCG/ACT nasal spray Place 2 sprays into both nostrils daily as needed for allergies.    [provider]  levocetirizine (XYZAL) 5 MG tablet Take 5 mg by mouth every evening.    [provider]  LORazepam (ATIVAN) 1 MG tablet Take 1 mg by mouth at bedtime. 04/09/18   [provider]  magnesium hydroxide (MILK OF MAGNESIA) 400 MG/5ML suspension Take 30 mLs by mouth daily as needed for mild constipation.    [provider]  metoCLOPramide (REGLAN) 10 MG tablet Take 1 tablet (10 mg total) by mouth every 6 (six) hours. 03/01/23   Long, Arlyss Repress, MD  metoprolol succinate (TOPROL-XL) 25 MG 24 hr tablet Take 25 mg by mouth daily. 04/12/22   [provider]  montelukast (SINGULAIR) 10 MG tablet TAKE ONE TABLET AT BEDTIME. 04/22/14   Ethelda Chick, MD  ondansetron (ZOFRAN-ODT) 8 MG disintegrating tablet Take 1 tablet (8 mg total) by mouth every 8 (eight) hours as needed for nausea or vomiting. 02/28/23   Cathren Laine, MD  potassium chloride SA (KLOR-CON M) 20 MEQ tablet One po bid x 3 days, then one po once a day 02/28/23   Cathren Laine, MD  rosuvastatin (CRESTOR) 10 MG tablet Take 10 mg by mouth as directed. Take Mon-Fri Only. 03/29/22   [provider]  sodium chloride 1 g tablet Take 1 g by mouth 2 (two) times daily with a meal.    [provider]  valsartan (DIOVAN) 80 MG tablet Take 80 mg by mouth at bedtime. 03/07/22   [provider]  venlafaxine (EFFEXOR) 37.5 MG tablet Take 112.5 mg by mouth daily. 07/31/22   [provider]      Allergies    Avelox [moxifloxacin hcl in nacl] and Haldol [haloperidol lactate]    Review of Systems   Review of Systems  Physical Exam Updated Vital Signs BP (!) 140/94 (BP Location: Right Arm)   Pulse (!) 113   Temp 98.3 F (36.8 C)   Resp 16   SpO2 100%  Physical Exam  ED Results / Procedures / Treatments   Labs (all labs ordered are listed, but only abnormal results are displayed) Labs Reviewed  COMPREHENSIVE METABOLIC PANEL  CBC WITH DIFFERENTIAL/PLATELET  LIPASE, BLOOD  MAGNESIUM    EKG None  Radiology CT ABDOMEN PELVIS W CONTRAST  Result Date: 02/28/2023 CLINICAL DATA:  Nausea, vomiting, upper abdominal pain since last night. Bowel obstruction suspected. EXAM: CT ABDOMEN AND PELVIS WITH CONTRAST TECHNIQUE: Multidetector CT imaging of the abdomen and pelvis was performed using the standard protocol following bolus administration of intravenous contrast. RADIATION DOSE REDUCTION: This exam was performed according to the departmental dose-optimization program which includes automated exposure control, adjustment of the mA and/or kV according to patient size and/or use of iterative reconstruction technique. CONTRAST:  OMNIPAQUE IOHEXOL 300  MG/ML  SOLN COMPARISON:  CT 04/15/2022 FINDINGS: Lower chest: Partially visualized patchy ground-glass opacity in the anterior right middle lobe. Hepatobiliary: Hepatic steatosis. Unremarkable gallbladder and biliary tree. Pancreas: Unremarkable. Spleen: Unremarkable. Adrenals/Urinary Tract: Normal adrenal glands. No urinary calculi or hydronephrosis. Distended bladder. Stomach/Bowel: Normal caliber large and small bowel. No bowel wall thickening. The appendix is not definitively visualized. No secondary signs of appendicitis. Stomach is within normal limits. Vascular/Lymphatic: Aortic atherosclerosis. No enlarged abdominal or pelvic lymph nodes. Reproductive: Calcified fibroid in the uterus.  No adnexal mass. Other: No free intraperitoneal fluid or air. Musculoskeletal: Posterior fusion L4-L5 with interbody spacer. Thoracolumbar spondylosis. No acute osseous abnormality. IMPRESSION: 1. No acute findings in the abdomen or pelvis. 2. Partially visualized patchy ground-glass opacity in the anterior right middle lobe suggestive of atypical infection. 3. Hepatic steatosis. 4. Fibroid uterus. Aortic Atherosclerosis (ICD10-I70.0). Electronically Signed   By: Minerva Fester M.D.   On: 02/28/2023 22:16   DG Chest 2 View  Result Date: 02/27/2023 CLINICAL DATA:  severe vomiting EXAM: CHEST - 2 VIEW COMPARISON:  Chest x-ray 08/11/2020 FINDINGS: The heart and mediastinal contours are within normal limits. No focal consolidation. No pulmonary edema. No pleural effusion. No pneumothorax. No acute osseous abnormality. IMPRESSION: No active cardiopulmonary disease. Electronically Signed   By: Tish Frederickson M.D.   On: 02/27/2023 14:18    Procedures Procedures  {Document cardiac monitor, telemetry assessment procedure when appropriate:1}  Medications Ordered in ED Medications  lactated ringers bolus 1,000 mL (1,000 mLs Intravenous New Bag/Given 03/01/23 1354)  diazepam (VALIUM) injection 2.5 mg (2.5 mg Intravenous  Given 03/01/23 1352)  metoCLOPramide (REGLAN) injection 10 mg (10 mg Intravenous Given 03/01/23 1352)  diphenhydrAMINE (BENADRYL) injection 12.5 mg (12.5 mg Intravenous Given 03/01/23 1352)    ED Course/ Medical Decision Making/ A&P   {   Click here for ABCD2, HEART and other calculatorsREFRESH Note before signing :1}                          Medical Decision Making Amount and/or Complexity of Data Reviewed Labs: ordered.  Risk Prescription drug management.   ***  {Document critical care time when appropriate:1} {Document review of labs and clinical decision tools ie heart score, Chads2Vasc2 etc:1}  {Document your independent review of radiology images, and any outside records:1} {Document your discussion with family members, caretakers, and with consultants:1} {Document social determinants of health affecting pt's care:1} {Document your decision making why or why not admission, treatments were needed:1} Final Clinical Impression(s) / ED Diagnoses Final diagnoses:  None    Rx / DC Orders ED Discharge Orders     None

## 2023-03-01 NOTE — Subjective & Objective (Signed)
Patient presented with nausea and vomiting since yesterday has been seen at [facility] for the same.  She continues to have pain states that she has a medicine for nausea but not for pain therefore she came back.  Noticed that she had a little bit of blood in her vomit this morning. She continues to smoke marijuana although states that she cut way back No history of pancreatitis or peptic ulcers no associated fevers or chills no sick contacts or suspicious food ingestion patient has history of cyclic vomiting

## 2023-03-01 NOTE — Assessment & Plan Note (Signed)
Hemoglobin stable we will continue to monitor make sure patient on Protonix twice daily.  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  Patient states that she never seen a GI but was planning to see somebody Will consult Dr. Dulce Sellar with [facility]

## 2023-03-01 NOTE — Assessment & Plan Note (Signed)
- -  Patient presenting with  cough,   and infiltrate in  lower lobe on chest x-ray -Infiltrate on CXR and 2-3 characteristics (fever, leukocytosis, purulent sputum) are consistent with pneumonia. -This appears to be most likely community-acquired pneumonia.      will admit for treatment of CAP will start on appropriate antibiotic coverage. - Rocephin/ doxy   Obtain:  sputum cultures,                  Obtain respiratory panel                                COVID PCR    Pending but unlikely                                    strep pneumo UA antigen,                  check for Legionella antigen.                Provide oxygen as needed.

## 2023-03-01 NOTE — Assessment & Plan Note (Signed)
Rehydrate and follow electrolytes

## 2023-03-01 NOTE — Assessment & Plan Note (Signed)
Restart Crestor at 10 mg a day

## 2023-03-01 NOTE — Assessment & Plan Note (Signed)
Supportive management continue Reglan scheduled and Zofran encourage discontinuation of marijuana abuse

## 2023-03-01 NOTE — ED Notes (Signed)
Called [facility] -- informed that the patient Bed Assignment is Ready 

## 2023-03-01 NOTE — H&P (Signed)
Katie Chang ZOX:[id number] DOB: [birth year] DOA: 03/01/2023     PCP: Delma Officer, PA   Outpatient Specialists: NONE   Patient arrived to ER on 03/01/23 at 1250 Referred by Attending Therisa Doyne, MD   Patient coming from:    home Lives alone,     Chief Complaint: nausea and vomiting   HPI: Katie Chang is a 62 y.o. female with medical history significant of hypertension hyperlipidemia cyclic vomiting marijuana abuse    Presented with   nausea vomiting Patient presented with nausea and vomiting since yesterday has been seen at [facility] for the same.  She continues to have pain states that she has a medicine for nausea but not for pain therefore she came back.  Noticed that she had a little bit of blood in her vomit this morning. She continues to smoke marijuana although states that she cut way back No history of pancreatitis or peptic ulcers no associated fevers or chills no sick contacts or suspicious food ingestion patient has history of cyclic vomiting Patient had a total of 3 visits in the past 48 hours for similar symptoms Reports not able to tolerate PO, dry heaving, mild cough Reports blood in vomit Wednesday night today she had some blood in vomit again  Denies significant ETOH intake   Does smoke  but interested in quitting         Regarding pertinent Chronic problems:     Hyperlipidemia - on statins Crestor    HTN on Norvasc Toprol Diovan   Asthma -well  controlled on home inhalers/ nebs      While in ER:  Initially sodium down to 128 lipase unremarkable potassium down to 2.7 patient appears to be hemoconcentrated white blood cell count 21.8 Urine tox positive for opiates and THC After rehydration repeat white blood cell count down to 17  She had multiple doses of Zofran prior Dilaudid Reglan Her potassium was repleted in the ER noted to have elevated lactic acid up to 2.0 CT abdomen showed hepatic steatosis but also noted a typical  anterior right middle lobe infiltrate suggestive of atypical infection patient started to improve with IV fluids Lactic acid has improved Patient actually tolerating ginger ale Repeat labs showed still persistent hypokalemia down to 2.7 At that time decision was made to transfer patient to [facility] to complete potassium replacement  Lab Orders         Comprehensive metabolic panel         CBC with Differential         Lipase, blood         Magnesium         Na and K (sodium & potassium), rand urine         Osmolality, urine         Osmolality         Basic metabolic panel    CXR - NON acute  CTabd/pelvis - . No acute findings in the abdomen or pelvis. 2. Partially visualized patchy ground-glass opacity in the anterior right middle lobe suggestive of atypical infection. 3. Hepatic steatosis. 4. Fibroid uterus.   Following Medications were ordered in ER: Medications  capsaicin (ZOSTRIX) 0.025 % cream ( Topical Not Given 03/01/23 1750)  lidocaine (LIDODERM) 5 % 1 patch (1 patch Transdermal Patch Applied 03/01/23 1511)  potassium chloride 10 mEq in 100 mL IVPB (0 mEq Intravenous Stopped 03/01/23 1759)  diazepam (VALIUM) injection 2.5 mg (has no administration in time range)  lactated ringers bolus 1,000 mL (0 mLs Intravenous Stopped 03/01/23 1601)  diazepam (VALIUM) injection 2.5 mg (2.5 mg Intravenous Given 03/01/23 1352)  metoCLOPramide (REGLAN) injection 10 mg (10 mg Intravenous Given 03/01/23 1352)  diphenhydrAMINE (BENADRYL) injection 12.5 mg (12.5 mg Intravenous Given 03/01/23 1352)  magnesium sulfate IVPB 2 g 50 mL (0 g Intravenous Stopped 03/01/23 1757)  pantoprazole (PROTONIX) injection 40 mg (40 mg Intravenous Given 03/01/23 1445)  alum & mag hydroxide-simeth (MAALOX/MYLANTA) 200-200-20 MG/5ML suspension 30 mL (30 mLs Oral Given 03/01/23 1453)  dicyclomine (BENTYL) capsule 10 mg (10 mg Oral Given 03/01/23 1452)  potassium chloride (KLOR-CON) packet 80 mEq (80 mEq Oral Given 03/01/23  1452)  potassium chloride 10 mEq in 100 mL IVPB (0 mEq Intravenous Stopped 03/01/23 1601)  0.9 %  sodium chloride infusion (0 mLs Intravenous Stopped 03/01/23 1758)     ED Triage Vitals  Enc Vitals Group     BP 03/01/23 1301 (!) 140/94     Pulse Rate 03/01/23 1301 (!) 113     Resp 03/01/23 1301 16     Temp 03/01/23 1301 98.3 F (36.8 C)     Temp Source 03/01/23 1758 Oral     SpO2 03/01/23 1301 100 %     Weight --      Height --      Head Circumference --      Peak Flow --      Pain Score 03/01/23 1302 10     Pain Loc --      Pain Edu? --      Excl. in GC? --   TMAX(24)@     _________________________________________ Significant initial  Findings: Abnormal Labs Reviewed  COMPREHENSIVE METABOLIC PANEL - Abnormal; Notable for the following components:      Result Value   Sodium 123 (*)    Potassium 2.7 (*)    Chloride 88 (*)    CO2 18 (*)    Glucose, Bld 122 (*)    BUN 7 (*)    Anion gap 17 (*)    All other components within normal limits  CBC WITH DIFFERENTIAL/PLATELET - Abnormal; Notable for the following components:   WBC 17.5 (*)    HCT 34.0 (*)    MCV 72.8 (*)    MCH 25.7 (*)    Platelets 454 (*)    Neutro Abs 11.8 (*)    Monocytes Absolute 1.4 (*)    Abs Immature Granulocytes 0.11 (*)    All other components within normal limits  LIPASE, BLOOD - Abnormal; Notable for the following components:   Lipase <10 (*)    All other components within normal limits  MAGNESIUM - Abnormal; Notable for the following components:   Magnesium 1.2 (*)    All other components within normal limits  BASIC METABOLIC PANEL - Abnormal; Notable for the following components:   Sodium 123 (*)    Potassium 2.8 (*)    Chloride 91 (*)    CO2 19 (*)    Glucose, Bld 114 (*)    BUN 7 (*)    All other components within normal limits      _________________________ Troponin  ordered     ECG: Ordered Personally reviewed and interpreted by me showing: HR : 100 Rhythm: Sinus tachycardia     Sinus tachycardia Left ventricular hypertrophy  QTC 45  BNP (last 3 results) No results for input(s): "BNP" in the last 8760 hours.   COVID-19 Labs  No results for input(s): "DDIMER", "FERRITIN", "LDH", "  CRP" in the last 72 hours.  Lab Results  Component Value Date   SARSCOV2NAA Not Detected 12/20/2019  _________    The recent clinical data is shown below. Vitals:   03/01/23 1301 03/01/23 1758  BP: (!) 140/94 (!) 161/92  Pulse: (!) 113 96  Resp: 16 16  Temp: 98.3 F (36.8 C) 98 F (36.7 C)  TempSrc:  Oral  SpO2: 100% 100%    WBC     Component Value Date/Time   WBC 17.5 (H) 03/01/2023 1347   LYMPHSABS 3.9 03/01/2023 1347   MONOABS 1.4 (H) 03/01/2023 1347   EOSABS 0.1 03/01/2023 1347   BASOSABS 0.1 03/01/2023 1347        Lactic Acid, Venous    Component Value Date/Time   LATICACIDVEN 0.9 03/01/2023 0036   Procalcitonin   Ordered      UA   no evidence of UTI      Urine analysis:    Component Value Date/Time   COLORURINE YELLOW 02/28/2023 0823   APPEARANCEUR CLEAR 02/28/2023 0823   LABSPEC 1.015 02/28/2023 0823   PHURINE 6.5 02/28/2023 0823   GLUCOSEU NEGATIVE 02/28/2023 0823   HGBUR TRACE (A) 02/28/2023 0823   BILIRUBINUR NEGATIVE 02/28/2023 0823   KETONESUR 40 (A) 02/28/2023 0823   PROTEINUR 30 (A) 02/28/2023 0823   UROBILINOGEN 0.2 12/29/2014 1357   NITRITE NEGATIVE 02/28/2023 0823   LEUKOCYTESUR NEGATIVE 02/28/2023 0823    ABX started Antibiotics Given (last 72 hours)     None       No results found for the last 90 days.    __________________________________________________________ Recent Labs  Lab 02/27/23 1247 02/28/23 0653 02/28/23 1914 03/01/23 1347 03/01/23 1825  NA 137 128* 128* 123* 123*  K 3.6 2.7* 2.7* 2.7* 2.8*  CO2 19* 17* 17* 18* 19*  GLUCOSE 157* 153* 130* 122* 114*  BUN 9 12 7* 7* 7*  CREATININE 0.76 0.75 0.59 0.71 0.67  CALCIUM 9.8 10.3 10.0 9.9 8.9  MG 1.9  --   --  1.2*  --     Cr  stable, Lab Results   Component Value Date   CREATININE 0.67 03/01/2023   CREATININE 0.71 03/01/2023   CREATININE 0.59 02/28/2023    Recent Labs  Lab 02/27/23 1247 02/28/23 0653 02/28/23 1914 03/01/23 1347  AST 22 23 34 34  ALT 15 12 15 18   ALKPHOS 74 72 70 72  BILITOT 0.8 1.2 1.2 1.2  PROT 8.1 7.7 7.4 7.4  ALBUMIN 4.6 5.1* 5.0 4.8   Lab Results  Component Value Date   CALCIUM 8.9 03/01/2023   PHOS 2.1 (L) 12/15/2022     Plt: Lab Results  Component Value Date   PLT 454 (H) 03/01/2023     Recent Labs  Lab 02/27/23 1247 02/28/23 0653 02/28/23 1914 03/01/23 1347  WBC 10.9* 21.8* 20.3* 17.5*  NEUTROABS 9.0*  --   --  11.8*  HGB 13.0 12.8 12.0 12.0  HCT 38.1 36.5 34.4* 34.0*  MCV 76.7* 73.6* 73.0* 72.8*  PLT 488* 512* 489* 454*    HG/HCT stable,      Component Value Date/Time   HGB 12.0 03/01/2023 1347   HCT 34.0 (L) 03/01/2023 1347   MCV 72.8 (L) 03/01/2023 1347   Recent Labs  Lab 02/27/23 1247 02/28/23 0653 02/28/23 1914 03/01/23 1347  LIPASE 26 <10* <10* <10*   _______________________________________________ Hospitalist was called for admission for  Hyperemesis, Hypomagnesemia Hypokalemia, Hyponatremia   The following Work up has been ordered so far:  Orders Placed This Encounter  Procedures   DG Chest Portable 1 View   Comprehensive metabolic panel   CBC with Differential   Lipase, blood   Magnesium   Na and K (sodium & potassium), rand urine   Osmolality, urine   Osmolality   Basic metabolic panel   Cardiac Monitoring Continuous x 48 hours Indications for use: Other; Other indications for use: QTc prolongation, electrolyte disturbance   Consult to hospitalist   ED EKG   EKG 12-Lead   Admit to Inpatient (patient's expected length of stay will be greater than 2 midnights or inpatient only procedure)     OTHER Significant initial  Findings:  labs showing:     DM  labs:  HbA1C: No results for input(s): "HGBA1C" in the last 8760 hours.     CBG (last  3)  No results for input(s): "GLUCAP" in the last 72 hours.        Cultures: No results found for: "SDES", "SPECREQUEST", "CULT", "REPTSTATUS"   Radiological Exams on Admission: DG Chest Portable 1 View  Result Date: 03/01/2023 CLINICAL DATA:  Vomiting. EXAM: PORTABLE CHEST 1 VIEW COMPARISON:  February 27, 2023. FINDINGS: The heart size and mediastinal contours are within normal limits. Both lungs are clear. The visualized skeletal structures are unremarkable. IMPRESSION: No active disease. Electronically Signed   By: Lupita Raider M.D.   On: 03/01/2023 15:09   CT ABDOMEN PELVIS W CONTRAST  Result Date: 02/28/2023 CLINICAL DATA:  Nausea, vomiting, upper abdominal pain since last night. Bowel obstruction suspected. EXAM: CT ABDOMEN AND PELVIS WITH CONTRAST TECHNIQUE: Multidetector CT imaging of the abdomen and pelvis was performed using the standard protocol following bolus administration of intravenous contrast. RADIATION DOSE REDUCTION: This exam was performed according to the departmental dose-optimization program which includes automated exposure control, adjustment of the mA and/or kV according to patient size and/or use of iterative reconstruction technique. CONTRAST:  OMNIPAQUE IOHEXOL 300 MG/ML  SOLN COMPARISON:  CT 04/15/2022 FINDINGS: Lower chest: Partially visualized patchy ground-glass opacity in the anterior right middle lobe. Hepatobiliary: Hepatic steatosis. Unremarkable gallbladder and biliary tree. Pancreas: Unremarkable. Spleen: Unremarkable. Adrenals/Urinary Tract: Normal adrenal glands. No urinary calculi or hydronephrosis. Distended bladder. Stomach/Bowel: Normal caliber large and small bowel. No bowel wall thickening. The appendix is not definitively visualized. No secondary signs of appendicitis. Stomach is within normal limits. Vascular/Lymphatic: Aortic atherosclerosis. No enlarged abdominal or pelvic lymph nodes. Reproductive: Calcified fibroid in the uterus.  No adnexal  mass. Other: No free intraperitoneal fluid or air. Musculoskeletal: Posterior fusion L4-L5 with interbody spacer. Thoracolumbar spondylosis. No acute osseous abnormality. IMPRESSION: 1. No acute findings in the abdomen or pelvis. 2. Partially visualized patchy ground-glass opacity in the anterior right middle lobe suggestive of atypical infection. 3. Hepatic steatosis. 4. Fibroid uterus. Aortic Atherosclerosis (ICD10-I70.0). Electronically Signed   By: Minerva Fester M.D.   On: 02/28/2023 22:16   _______________________________________________________________________________________________________ Latest  Blood pressure (!) 161/92, pulse 96, temperature 98 F (36.7 C), temperature source Oral, resp. rate 16, SpO2 100 %.   Vitals  labs and radiology finding personally reviewed  Review of Systems:    Pertinent positives include:  fatigue, abdominal pain, nausea, vomiting Constitutional:  No weight loss, night sweats, Fevers, chills weight loss  HEENT:  No headaches, Difficulty swallowing,Tooth/dental problems,Sore throat,  No sneezing, itching, ear ache, nasal congestion, post nasal drip,  Cardio-vascular:  No chest pain, Orthopnea, PND, anasarca, dizziness, palpitations.no Bilateral lower extremity swelling  GI:  No heartburn, indigestion, , diarrhea,  change in bowel habits, loss of appetite, melena, blood in stool, hematemesis Resp:  no shortness of breath at rest. No dyspnea on exertion, No excess mucus, no productive cough, No non-productive cough, No coughing up of blood.No change in color of mucus.No wheezing. Skin:  no rash or lesions. No jaundice GU:  no dysuria, change in color of urine, no urgency or frequency. No straining to urinate.  No flank pain.  Musculoskeletal:  No joint pain or no joint swelling. No decreased range of motion. No back pain.  Psych:  No change in mood or affect. No depression or anxiety. No memory loss.  Neuro: no localizing neurological complaints, no  tingling, no weakness, no double vision, no gait abnormality, no slurred speech, no confusion  All systems reviewed and apart from HOPI all are negative _______________________________________________________________________________________________ Past Medical History:   Past Medical History:  Diagnosis Date   Allergy    Arthritis    Asthma    Cervical dysplasia    Cyclic vomiting syndrome    DDD (degenerative disc disease), cervical    Cervical and Lumbar   Depression    Eczema    GERD (gastroesophageal reflux disease)    Hypertension    Menopausal symptoms    Migraines    PONV (postoperative nausea and vomiting)    sometimes nausea   Recurrent sinus infections       Past Surgical History:  Procedure Laterality Date   CERVICAL BIOPSY     Dr. Eda Paschal   COLONOSCOPY W/ POLYPECTOMY     COLPOSCOPY     ESOPHAGOGASTRODUODENOSCOPY (EGD) WITH PROPOFOL N/A 05/13/2016   Procedure: ESOPHAGOGASTRODUODENOSCOPY (EGD) WITH PROPOFOL;  Surgeon: Charolett Bumpers, MD;  Location: [facility];  Service: Endoscopy;  Laterality: N/A;   KNEE SURGERY     NASAL SINUS SURGERY  1992   NECK SURGERY     TONSILLECTOMY  1990    Social History:  Ambulatory  independently     reports that she has been smoking cigarettes. She has a 15.00 pack-year smoking history. She has never used smokeless tobacco. She reports current drug use. Drug: Marijuana. She reports that she does not drink alcohol.    Family History:  Family History  Problem Relation Age of Onset   Heart disease Mother    Hypertension Father    Diabetes Paternal Grandfather    Heart disease Paternal Grandfather    Stroke Maternal Grandmother    Stroke Maternal Grandfather    Breast cancer Maternal Aunt        Age 63's   ______________________________________________________________________________________________ Allergies: Allergies  Allergen Reactions   Avelox [Moxifloxacin Hcl In Nacl] Hives    No allergic reaction to  Levaquin.   Haldol [Haloperidol Lactate] Other (See Comments)    "Night terrors"     Prior to Admission medications   Medication Sig Start Date End Date Taking? Authorizing Provider  albuterol (PROAIR HFA) 108 (90 BASE) MCG/ACT inhaler Inhale 2 puffs into the lungs every 6 (six) hours as needed for wheezing. 03/11/13   Sondra Barges, PA-C  amLODipine (NORVASC) 5 MG tablet Take 5 mg by mouth daily. 03/29/22   [provider]  Capsaicin-Menthol-Methyl Sal (CAPSAICIN-METHYL SAL-MENTHOL) 0.025-1-12 % CREA Apply 1 Application topically 2 (two) times daily as needed. Patient not taking: Reported on 12/25/2022 08/30/22   Tanda Rockers A, DO  celecoxib (CELEBREX) 200 MG capsule Take 200 mg by mouth daily. 05/23/22   [provider]  cholecalciferol (VITAMIN D3) 25 MCG (1000 UT) tablet Take  2,000 Units by mouth daily.    [provider]  esomeprazole (NEXIUM) 40 MG capsule Take 40 mg by mouth daily. 03/04/18   [provider]  fluticasone (FLONASE) 50 MCG/ACT nasal spray Place 2 sprays into both nostrils daily as needed for allergies.    [provider]  levocetirizine (XYZAL) 5 MG tablet Take 5 mg by mouth every evening.    [provider]  LORazepam (ATIVAN) 1 MG tablet Take 1 mg by mouth at bedtime. 04/09/18   [provider]  magnesium hydroxide (MILK OF MAGNESIA) 400 MG/5ML suspension Take 30 mLs by mouth daily as needed for mild constipation.    [provider]  metoCLOPramide (REGLAN) 10 MG tablet Take 1 tablet (10 mg total) by mouth every 6 (six) hours. 03/01/23   Long, Arlyss Repress, MD  metoprolol succinate (TOPROL-XL) 25 MG 24 hr tablet Take 25 mg by mouth daily. 04/12/22   [provider]  montelukast (SINGULAIR) 10 MG tablet TAKE ONE TABLET AT BEDTIME. 04/22/14   Ethelda Chick, MD  ondansetron (ZOFRAN-ODT) 8 MG disintegrating tablet Take 1 tablet (8 mg total) by mouth every 8 (eight) hours as needed for nausea or vomiting.  02/28/23   Cathren Laine, MD  potassium chloride SA (KLOR-CON M) 20 MEQ tablet One po bid x 3 days, then one po once a day 02/28/23   Cathren Laine, MD  rosuvastatin (CRESTOR) 10 MG tablet Take 10 mg by mouth as directed. Take Mon-Fri Only. 03/29/22   [provider]  sodium chloride 1 g tablet Take 1 g by mouth 2 (two) times daily with a meal.    [provider]  valsartan (DIOVAN) 80 MG tablet Take 80 mg by mouth at bedtime. 03/07/22   [provider]  venlafaxine (EFFEXOR) 37.5 MG tablet Take 112.5 mg by mouth daily. 07/31/22   [provider]    ___________________________________________________________________________________________________ Physical Exam:    03/01/2023    5:58 PM 03/01/2023    1:01 PM 03/01/2023    1:41 AM  Vitals with BMI  Systolic 161 140 119  Diastolic 92 94 80  Pulse 96 113 90     1. General:  in No  Acute distress    Chronically ill  appearing 2. Psychological: Alert and   Oriented 3. Head/ENT:    Dry Mucous Membranes                          Head Non traumatic, neck supple                          Poor Dentition 4. SKIN:  decreased Skin turgor,  Skin clean Dry and intact no rash    5. Heart: Regular rate and rhythm no  Murmur, no Rub or gallop 6. Lungs:  no wheezes or crackles   7. Abdomen: Soft,  non-tender, Non distended   obese  bowel sounds present 8. Lower extremities: no clubbing, cyanosis, no  edema 9. Neurologically Grossly intact, moving all 4 extremities equally  10. MSK: Normal range of motion    Chart has been reviewed  ______________________________________________________________________________________________  Assessment/Plan 62 y.o. female with medical history significant of hypertension hyperlipidemia cyclic vomiting marijuana abuse    Admitted for   Hyperemesis Hypomagnesemia Hypokalemia Hyponatremia     Present on Admission:  Intractable cyclical vomiting with nausea  Asthma  Hematemesis   Hypokalemia  Dehydration  Hypomagnesemia  Hyponatremia  Cannabis use disorder  Hyperlipidemia  Essential hypertension  Prolonged QT interval  Tobacco abuse  CAP (community acquired pneumonia)     Intractable cyclical vomiting with nausea Supportive management continue Reglan scheduled and Zofran encourage discontinuation of marijuana abuse  Asthma Chronic stable nonacute  Hematemesis Hemoglobin stable we will continue to monitor make sure patient on Protonix twice daily.  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  Patient states that she never seen a GI but was planning to see somebody Will consult Dr. Dulce Sellar with [facility]   Hypokalemia Replace and continue to monitor recheck magnesium level  Dehydration Rehydrate and follow electrolytes  Hypomagnesemia Was repleted we will recheck  Hyponatremia In the setting of decreased p.o. intake and increased water and ginger ale intake gently rehydrate reduce free water drinking Obtain electrolytes and monitor frequent labs Currently symptomatic patient states she chronically has hyponatremia Check TSH  Cannabis use disorder Spoke about importance of quitting  Hyperlipidemia Restart Crestor at 10 mg a day  Essential hypertension Allow permissive hypertension for tonight  Prolonged QT interval - will monitor on tele avoid QT prolonging medications, rehydrate correct electrolytes   Tobacco abuse  - Spoke about importance of quitting spent 5 minutes discussing options for treatment, prior attempts at quitting, and dangers of smoking  -At this point patient is   interested in quitting  - order nicotine patch   - nursing tobacco cessation protocol   CAP (community acquired pneumonia)  - -Patient presenting with  cough,   and infiltrate in  lower lobe on chest x-ray -Infiltrate on CXR and 2-3 characteristics (fever, leukocytosis, purulent sputum) are consistent with pneumonia. -This appears to be most likely community-acquired  pneumonia.      will admit for treatment of CAP will start on appropriate antibiotic coverage. - Rocephin/ doxy   Obtain:  sputum cultures,                  Obtain respiratory panel                                COVID PCR    Pending but unlikely                                    strep pneumo UA antigen,                  check for Legionella antigen.                Provide oxygen as needed.     Other plan as per orders.  DVT prophylaxis:  SCD    Code Status:    Code Status: DNR  DNR/DNI   as per patient   I had personally discussed CODE STATUS with patient   ACP none   Family Communication:   Family not at  Bedside    Diet regular   Disposition Plan:         To home once workup is complete and patient is stable   Following barriers for discharge:                            Electrolytes corrected  Will need consultants to evaluate patient prior to discharge        Consult Orders  (From admission, onward)           Start     Ordered   03/01/23 1458  Consult to hospitalist  Called [facility] -- spoke to [place]  Once       Provider:  (Not yet assigned)  Question Answer Comment  Place call to: [facility]   Reason for Consult Admit      03/01/23 1457                                Consults called: [facility]   Admission status:  ED Disposition     ED Disposition  Admit   Condition  --   Comment  Hospital [facility] [[id number]]  Level of Care: Telemetry [5]  Admit to tele based on following criteria: Monitor QTC interval  May admit patient to [facility] or [facility] if equivalent level of care is available:: Yes  Interfacility transfer: Yes  Covid Evaluation: Asymptomatic - no recent exposure (last 10 days) testing not required  Diagnosis: Intractable cyclical vomiting with nausea [[id number]]  Admitting Physician: [place], ERIC J [[id number]]  Attending Physician:  [place], ERIC J [[id number]]  Certification:: I certify this patient will need inpatient services for at least 2 midnights  Estimated Length of Stay: 3           inpatient     I Expect 2 midnight stay secondary to severity of patient's current illness need for inpatient interventions justified by the following:  hemodynamic instability despite optimal treatment (tachycardia )   Severe lab/radiological/exam abnormalities including:    dehydration and extensive comorbidities including:  substance abuse   Chronic pain   Asthma   That are currently affecting medical management.   I expect  patient to be hospitalized for 2 midnights requiring inpatient medical care.  Patient is at high risk for adverse outcome (such as loss of life or disability) if not treated.  Indication for inpatient stay as follows:   Need for operative/procedural  intervention     Need for IV antibiotics, IV fluids, IV  pain medications, IV ppi    Level of care     tele  For   24H       Shamere Campas 03/01/2023, 8:50 PM    [facility]     after 2 AM please page floor coverage PA If 7AM-7PM, please contact the day team taking care of the patient using [email or web address]

## 2023-03-01 NOTE — Assessment & Plan Note (Signed)
Allow permissive hypertension for tonight 

## 2023-03-01 NOTE — ED Notes (Signed)
CRITICAL VALUE STICKER  CRITICAL VALUE:K+ 2.7  RECEIVER (on-site recipient of call):Carmon Ginsberg, RN  DATE & TIME NOTIFIED:   MESSENGER (representative from lab):  MD NOTIFIED: Dr. Elpidio Anis  TIME OF NOTIFICATION:1425  RESPONSE:

## 2023-03-01 NOTE — Assessment & Plan Note (Signed)
Spoke about importance of quitting  

## 2023-03-01 NOTE — Assessment & Plan Note (Signed)
-   will monitor on tele avoid QT prolonging medications, rehydrate correct electrolytes  

## 2023-03-01 NOTE — Assessment & Plan Note (Signed)
Chronic stable nonacute

## 2023-03-01 NOTE — ED Notes (Signed)
Pt tolerated ginger ale.  

## 2023-03-01 NOTE — Assessment & Plan Note (Signed)
Was repleted we will recheck

## 2023-03-01 NOTE — ED Notes (Signed)
RN reviewed discharge instructions with pt. Pt verbalized understanding and had no further questions. VSS upon discharge.  

## 2023-03-01 NOTE — Progress Notes (Signed)
Plan of Care Note for accepted transfer   Patient: Katie Chang MRN: [id number]   DOA: 03/01/2023  Facility requesting transfer: [facility] Requesting Provider: Vivien Rossetti, MD Reason for transfer: Intractable cyclical nausea/vomiting Facility course:   42 female with past medical history significant for HTN, HLD, GERD, depression, chronic THC abuse with history of cyclical intractable nausea/vomiting who has presented four times to the ED over the last 3 days with intractable nausea/vomiting.  History of such secondary to likely cannabinoid hyperemesis syndrome.  Patient was noted to have hyponatremia, hypokalemia, hypochloremia with elevated WBC count of 17.5.  Lactic acid 0.9.  UDS positive for opiates, THC.  Recent CT abdomen/pelvis 4/12 with no acute findings in the abdomen/pelvis.  Patient was given IV fluids, antiemetics and electrolyte replacement.  Is hemodynamically stable and no focal physical exam findings reported by ED physician.  EDP requesting transfer to [facility] for further care.  Plan of care: The patient is accepted for admission to Telemetry unit, at [facility].    Author: Alvira Philips [place], DO 03/01/2023  Check [email or web address] for on-call coverage.  Nursing staff, Please call [facility] Admits & Consults System-Wide number on Amion as soon as patient's arrival, so appropriate admitting provider can evaluate the pt.

## 2023-03-01 NOTE — ED Triage Notes (Signed)
Left here yesterday and was feeling better/ at home pt started drinking ginger ale and started vomiting again. Pt was not able to pick up her prescription for reglan, perhaps we can send to our pharmacy today.

## 2023-03-01 NOTE — ED Provider Notes (Signed)
Blood pressure (!) 189/89, pulse 88, temperature 99.6 F (37.6 C), temperature source Oral, resp. rate (!) 22, height 5\' 3"  (1.6 m), weight 73 kg, SpO2 99 %.  Assuming care from Dr. Jearld Fenton.  In short, Katie Chang is a 62 y.o. female with a chief complaint of Emesis .  Refer to the original H&P for additional details.  The current plan of care is to follow up on repeat labs.  01:03 AM  Repeat lactic normalized. Patient feeling well and stable for discharge.     Maia Plan, MD 03/01/23 [phone]

## 2023-03-01 NOTE — Discharge Instructions (Signed)
You have been seen in the Emergency Department (ED) today for nausea and vomiting.  Your work up today has not shown a clear cause for your symptoms. °You have been prescribed Reglan; please use as prescribed as needed for your nausea. ° °Follow up with your doctor as soon as possible regarding today?s emergent visit and your symptoms of nausea.  ° °Return to the ED if you develop abdominal, bloody vomiting, bloody diarrhea, if you are unable to tolerate fluids due to vomiting, or if you develop other symptoms that concern you. ° °

## 2023-03-01 NOTE — Assessment & Plan Note (Signed)
-   Spoke about importance of quitting spent 5 minutes discussing options for treatment, prior attempts at quitting, and dangers of smoking  -At this point patient is     interested in quitting  - order nicotine patch   - nursing tobacco cessation protocol  

## 2023-03-02 DIAGNOSIS — R1115 Cyclical vomiting syndrome unrelated to migraine: Secondary | ICD-10-CM | POA: Diagnosis not present

## 2023-03-02 LAB — RESPIRATORY PANEL BY PCR

## 2023-03-02 LAB — BASIC METABOLIC PANEL
Anion gap: 11 (ref 5–15)
Anion gap: 10 (ref 5–15)
Anion gap: 9 (ref 5–15)
BUN: 6 mg/dL — ABNORMAL LOW (ref 8–23)
BUN: 6 mg/dL — ABNORMAL LOW (ref 8–23)
BUN: 6 mg/dL — ABNORMAL LOW (ref 8–23)
CO2: 22 mmol/L (ref 22–32)
CO2: 20 mmol/L — ABNORMAL LOW (ref 22–32)
CO2: 21 mmol/L — ABNORMAL LOW (ref 22–32)
Calcium: 9.2 mg/dL (ref 8.9–10.3)
Calcium: 8.9 mg/dL (ref 8.9–10.3)
Calcium: 8.6 mg/dL — ABNORMAL LOW (ref 8.9–10.3)
Chloride: 93 mmol/L — ABNORMAL LOW (ref 98–111)
Chloride: 95 mmol/L — ABNORMAL LOW (ref 98–111)
Chloride: 96 mmol/L — ABNORMAL LOW (ref 98–111)
Creatinine, Ser: 0.66 mg/dL (ref 0.44–1.00)
Creatinine, Ser: 0.57 mg/dL (ref 0.44–1.00)
Creatinine, Ser: 0.67 mg/dL (ref 0.44–1.00)
GFR, Estimated: >60 mL/min (ref >60)
GFR, Estimated: >60 mL/min (ref >60)
GFR, Estimated: >60 mL/min (ref >60)
Glucose, Bld: 123 mg/dL — ABNORMAL HIGH (ref 70–99)
Glucose, Bld: 178 mg/dL — ABNORMAL HIGH (ref 70–99)
Glucose, Bld: 130 mg/dL — ABNORMAL HIGH (ref 70–99)
Potassium: 5.1 mmol/L (ref 3.5–5.1)
Potassium: 3.4 mmol/L — ABNORMAL LOW (ref 3.5–5.1)
Potassium: 3 mmol/L — ABNORMAL LOW (ref 3.5–5.1)
Sodium: 126 mmol/L — ABNORMAL LOW (ref 135–145)
Sodium: 125 mmol/L — ABNORMAL LOW (ref 135–145)
Sodium: 126 mmol/L — ABNORMAL LOW (ref 135–145)

## 2023-03-02 LAB — MAGNESIUM: Magnesium: 2.1 mg/dL (ref 1.7–2.4)

## 2023-03-02 LAB — URINALYSIS, COMPLETE (UACMP) WITH MICROSCOPIC
Bacteria, UA: NONE SEEN
Bilirubin Urine: NEGATIVE
Glucose, UA: NEGATIVE mg/dL
Hgb urine dipstick: NEGATIVE
Ketones, ur: 20 mg/dL — AB
Leukocytes,Ua: NEGATIVE
Nitrite: NEGATIVE
Protein, ur: NEGATIVE mg/dL
Specific Gravity, Urine: 1.006 (ref 1.005–1.030)
pH: 7 (ref 5.0–8.0)

## 2023-03-02 LAB — CBC
HCT: 33.6 % — ABNORMAL LOW (ref 36.0–46.0)
Hemoglobin: 11.6 g/dL — ABNORMAL LOW (ref 12.0–15.0)
MCH: 26 pg (ref 26.0–34.0)
MCHC: 34.5 g/dL (ref 30.0–36.0)
MCV: 75.2 fL — ABNORMAL LOW (ref 80.0–100.0)
Platelets: 390 10*3/uL (ref 150–400)
RBC: 4.47 MIL/uL (ref 3.87–5.11)
RDW: 15.3 % (ref 11.5–15.5)
WBC: 15.7 10*3/uL — ABNORMAL HIGH (ref 4.0–10.5)
nRBC: 0 % (ref 0.0–0.2)

## 2023-03-02 LAB — COMPREHENSIVE METABOLIC PANEL
ALT: 22 U/L (ref 0–44)
AST: 30 U/L (ref 15–41)
Albumin: 4.5 g/dL (ref 3.5–5.0)
Alkaline Phosphatase: 69 U/L (ref 38–126)
Anion gap: 12 (ref 5–15)
BUN: 6 mg/dL — ABNORMAL LOW (ref 8–23)
CO2: 20 mmol/L — ABNORMAL LOW (ref 22–32)
Calcium: 8.6 mg/dL — ABNORMAL LOW (ref 8.9–10.3)
Chloride: 92 mmol/L — ABNORMAL LOW (ref 98–111)
Creatinine, Ser: 0.58 mg/dL (ref 0.44–1.00)
GFR, Estimated: >60 mL/min (ref >60)
Glucose, Bld: 117 mg/dL — ABNORMAL HIGH (ref 70–99)
Potassium: 3.1 mmol/L — ABNORMAL LOW (ref 3.5–5.1)
Sodium: 124 mmol/L — ABNORMAL LOW (ref 135–145)
Total Bilirubin: 0.8 mg/dL (ref 0.3–1.2)
Total Protein: 7.5 g/dL (ref 6.5–8.1)

## 2023-03-02 LAB — SODIUM, URINE, RANDOM
Sodium, Ur: 92 mmol/L
Sodium, Ur: <10 mmol/L

## 2023-03-02 LAB — PREALBUMIN: Prealbumin: 23 mg/dL (ref 18–38)

## 2023-03-02 LAB — OSMOLALITY, URINE: Osmolality, Ur: 296 mOsm/kg — ABNORMAL LOW (ref 300–900)

## 2023-03-02 LAB — SODIUM: Sodium: 127 mmol/L — ABNORMAL LOW (ref 135–145)

## 2023-03-02 LAB — CREATININE, URINE, RANDOM: Creatinine, Urine: 33 mg/dL

## 2023-03-02 LAB — PHOSPHORUS: Phosphorus: 3.2 mg/dL (ref 2.5–4.6)

## 2023-03-02 LAB — STREP PNEUMONIAE URINARY ANTIGEN: Strep Pneumo Urinary Antigen: NEGATIVE

## 2023-03-02 LAB — HIV ANTIBODY (ROUTINE TESTING W REFLEX): HIV Screen 4th Generation wRfx: NONREACTIVE

## 2023-03-02 LAB — OSMOLALITY: Osmolality: 258 mOsm/kg — ABNORMAL LOW (ref 275–295)

## 2023-03-02 MED ORDER — PROSOURCE PLUS PO LIQD
30.0000 mL | Freq: Two times a day (BID) | ORAL | Status: DC
  Administered 2023-03-02 – 2023-03-03 (×2): 30 mL via ORAL
  Filled 2023-03-02 (×2): qty 30

## 2023-03-02 MED ORDER — ADULT MULTIVITAMIN W/MINERALS CH
1.0000 | ORAL_TABLET | Freq: Every day | ORAL | Status: DC
  Administered 2023-03-02 – 2023-03-03 (×2): 1 via ORAL
  Filled 2023-03-02 (×2): qty 1

## 2023-03-02 MED ORDER — SODIUM CHLORIDE 0.9 % IV SOLN: INTRAVENOUS | Status: DC

## 2023-03-02 MED ORDER — POTASSIUM CHLORIDE 10 MEQ/100ML IV SOLN
INTRAVENOUS | Status: AC
  Administered 2023-03-02: 10 meq via INTRAVENOUS
  Filled 2023-03-02: qty 100

## 2023-03-02 MED ORDER — BOOST / RESOURCE BREEZE PO LIQD CUSTOM
1.0000 | Freq: Three times a day (TID) | ORAL | Status: DC
  Administered 2023-03-02 – 2023-03-03 (×4): 1 via ORAL

## 2023-03-02 MED ORDER — POTASSIUM CHLORIDE 10 MEQ/100ML IV SOLN
10.0000 meq | INTRAVENOUS | Status: AC
  Administered 2023-03-02 (×2): 10 meq via INTRAVENOUS
  Filled 2023-03-02 (×2): qty 100

## 2023-03-02 MED ORDER — POTASSIUM CHLORIDE CRYS ER 20 MEQ PO TBCR
40.0000 meq | EXTENDED_RELEASE_TABLET | Freq: Once | ORAL | Status: AC
  Administered 2023-03-02: 40 meq via ORAL
  Filled 2023-03-02: qty 2

## 2023-03-02 NOTE — Anesthesia Preprocedure Evaluation (Signed)
Anesthesia Evaluation  Patient identified by MRN, date of birth, ID band Patient awake    Reviewed: Allergy & Precautions, H&P , NPO status , Patient's Chart, lab work & pertinent test results  History of Anesthesia Complications (+) PONV and history of anesthetic complications  Airway Mallampati: II  TM Distance: >3 FB Neck ROM: Full    Dental no notable dental hx. (+) Teeth Intact, Dental Advisory Given, Chipped #13 chipped:   Pulmonary neg pulmonary ROS, asthma , pneumonia, Current Smoker   Pulmonary exam normal breath sounds clear to auscultation       Cardiovascular Exercise Tolerance: Good hypertension, Pt. on medications and Pt. on home beta blockers negative cardio ROS Normal cardiovascular exam Rhythm:Regular Rate:Normal     Neuro/Psych  Headaches PSYCHIATRIC DISORDERS Anxiety Depression    negative neurological ROS  negative psych ROS   GI/Hepatic negative GI ROS, Neg liver ROS,GERD  Medicated,,  Endo/Other  negative endocrine ROS    Renal/GU negative Renal ROS  negative genitourinary   Musculoskeletal negative musculoskeletal ROS (+) Arthritis , Osteoarthritis,    Abdominal   Peds negative pediatric ROS (+)  Hematology negative hematology ROS (+)   Anesthesia Other Findings   Reproductive/Obstetrics negative OB ROS                             Anesthesia Physical Anesthesia Plan  ASA: 3  Anesthesia Plan: MAC   Post-op Pain Management: Minimal or no pain anticipated   Induction: Intravenous  PONV Risk Score and Plan: 1 and Treatment may vary due to age or medical condition and Propofol infusion  Airway Management Planned: Simple Face Mask and Natural Airway  Additional Equipment: None  Intra-op Plan:   Post-operative Plan:   Informed Consent: I have reviewed the patients History and Physical, chart, labs and discussed the procedure including the risks, benefits  and alternatives for the proposed anesthesia with the patient or authorized representative who has indicated his/her understanding and acceptance.    Suspend DNR and Discussed DNR with patient.     Plan Discussed with: Anesthesiologist, CRNA and Surgeon  Anesthesia Plan Comments:        Anesthesia Quick Evaluation

## 2023-03-02 NOTE — Progress Notes (Signed)
Initial Nutrition Assessment RD working remotely.  DOCUMENTATION CODES:   Not applicable  INTERVENTION:  - ordered Boost Breeze TID, each supplement provides 250 kcal and 9 grams of protein.  - ordered 30 ml Prosource Plus BID, each supplement provides 100 kcal and 15 grams protein.   - ordered 1 tablet multivitamin with minerals/day.  - diet advancement as medically feasible.    NUTRITION DIAGNOSIS:   Inadequate oral intake related to acute illness, nausea, vomiting as evidenced by per patient/family report.  GOAL:   Patient will meet greater than or equal to 90% of their needs  MONITOR:   PO intake, Supplement acceptance, Diet advancement, Labs, Weight trends, I & O's  REASON FOR ASSESSMENT:   Consult Assessment of nutrition requirement/status  ASSESSMENT:   62 y.o. female with medical history of HTN, HLD, cyclic vomiting, marijuana abuse, migraines, recurrent sinus infections, depression, asthma, arthritis, GERD, and DDD. She presented to the ED with N/V since the day prior when she had also presented to the ED for the same. Patient reported having pain and stated that she had medication for nausea but not for pain so she returned to the ED. Patient continues to smoke marijuana; reported that she has cut back significantly. Patient has had 3 ED visits in 48 hours with the same symptoms. She reported inability to tolerate PO, dry heaving, and mild cough. She did have some blood in her emesis.  She consumed 95% of lunch on CLD today. She has not been assessed by a [facility] RD at any time in the past.  Weight on 02/28/23 was 161 lb and weight has been stable since 12/13/22. No information documented in the edema section of flow sheet.  Per notes: - s/p CT abdomen which showed hepatic steatosis  - cyclic N/V - hematemesis -- Hgb stable; s/p GI evaluation 4/14 and patient cleared for Soft diet, plan for endoscopy 03/03/23 - on admit: hyponatremia, hypokalemia,  hypomagnesemia, dehydration - CAP    Labs reviewed; Na: 125 mmol/l, K: 3.4 mmol/l, Cl: 95 mmol/l, BUN: 6 mg/dl.  Medications reviewed; 2 g IV Mg sulfate x1 run 4/13, 10 mg IV reglan x1 dose 4/13 and then 5 mg IV every 6 hours, 40 mg IV protonix BID, 80 mEq Klor-Con x1 dose 4/13, 10 mEq IV KCl x7 runs 4/13 and x3 runs 4/14.    NUTRITION - FOCUSED PHYSICAL EXAM:  RD working remotely.  Diet Order:   Diet Order             Diet clear liquid Room service appropriate? Yes; Fluid consistency: Thin  Diet effective now                   EDUCATION NEEDS:   No education needs have been identified at this time  Skin:  Skin Assessment: Reviewed RN Assessment  Last BM:  PTA/unknown  Height:   Ht Readings from Last 1 Encounters:  02/28/23 5\' 3"  (1.6 m)    Weight:   Wt Readings from Last 1 Encounters:  02/28/23 73 kg    BMI:  There is no height or weight on file to calculate BMI.  Estimated Nutritional Needs:  Kcal:  1600-1800 kcal Protein:  80-90 grams Fluid:  >/= 2.2 L/day     Trenton Gammon, MS, RD, LDN, CNSC Clinical Dietitian PRN/Relief staff On-call/weekend pager # available in [facility]

## 2023-03-02 NOTE — Hospital Course (Addendum)
Katie Chang is a 62 y.o. female with a history of hypertension, hyperlipidemia, cyclic vomiting, marijuana use, anxiety, hyperlipidemia, GERD. Patient presented secondary to nausea/vomiting with associated hematemesis, hyponatremia and hypokalemia. GI consulted. Also with evidence of pneumonia; empiric antibiotics started.

## 2023-03-02 NOTE — Consult Note (Signed)
[facility] Consultation Note  Referring Provider: [facility] Primary Care Physician:  Delma Officer, [place] Primary Gastroenterologist:  Dr. Ewing Schlein  Reason for Consultation:  nausea, vomiting, hematemesis  HPI: Katie Chang is a 62 y.o. female history of recurrent nausea/vomiting (cannabis hyperemesis, migraine headaches) presenting intractable nausea and vomiting with some bloody emesis.  No blood in stool or abdominal pain.  Tolerating clear liquids.  Takes celecoxib for back pain.  Prior endoscopy 2017 for vomiting by Dr. Laural Benes unrevealing.   Past Medical History:  Diagnosis Date   Allergy    Arthritis    Asthma    Cervical dysplasia    Cyclic vomiting syndrome    DDD (degenerative disc disease), cervical    Cervical and Lumbar   Depression    Eczema    GERD (gastroesophageal reflux disease)    Hypertension    Menopausal symptoms    Migraines    PONV (postoperative nausea and vomiting)    sometimes nausea   Recurrent sinus infections     Past Surgical History:  Procedure Laterality Date   CERVICAL BIOPSY     Dr. Eda Paschal   COLONOSCOPY W/ POLYPECTOMY     COLPOSCOPY     ESOPHAGOGASTRODUODENOSCOPY (EGD) WITH PROPOFOL N/A 05/13/2016   Procedure: ESOPHAGOGASTRODUODENOSCOPY (EGD) WITH PROPOFOL;  Surgeon: Charolett Bumpers, MD;  Location: [facility];  Service: Endoscopy;  Laterality: N/A;   KNEE SURGERY     NASAL SINUS SURGERY  1992   NECK SURGERY     TONSILLECTOMY  1990    Prior to Admission medications   Medication Sig Start Date End Date Taking? Authorizing Provider  albuterol (PROAIR HFA) 108 (90 BASE) MCG/ACT inhaler Inhale 2 puffs into the lungs every 6 (six) hours as needed for wheezing. 03/11/13   Sondra Barges, PA-C  amLODipine (NORVASC) 5 MG tablet Take 5 mg by mouth daily. 03/29/22   [provider]  Capsaicin-Menthol-Methyl Sal (CAPSAICIN-METHYL SAL-MENTHOL) 0.025-1-12 % CREA Apply 1 Application topically 2 (two) times daily as  needed. Patient not taking: Reported on 12/25/2022 08/30/22   Tanda Rockers A, DO  celecoxib (CELEBREX) 200 MG capsule Take 200 mg by mouth daily. 05/23/22   [provider]  cholecalciferol (VITAMIN D3) 25 MCG (1000 UT) tablet Take 2,000 Units by mouth daily.    [provider]  esomeprazole (NEXIUM) 40 MG capsule Take 40 mg by mouth daily. 03/04/18   [provider]  fluticasone (FLONASE) 50 MCG/ACT nasal spray Place 2 sprays into both nostrils daily as needed for allergies.    [provider]  levocetirizine (XYZAL) 5 MG tablet Take 5 mg by mouth every evening.    [provider]  LORazepam (ATIVAN) 1 MG tablet Take 1 mg by mouth at bedtime. 04/09/18   [provider]  magnesium hydroxide (MILK OF MAGNESIA) 400 MG/5ML suspension Take 30 mLs by mouth daily as needed for mild constipation.    [provider]  metoCLOPramide (REGLAN) 10 MG tablet Take 1 tablet (10 mg total) by mouth every 6 (six) hours. 03/01/23   Long, Arlyss Repress, MD  metoprolol succinate (TOPROL-XL) 25 MG 24 hr tablet Take 25 mg by mouth daily. 04/12/22   [provider]  montelukast (SINGULAIR) 10 MG tablet TAKE ONE TABLET AT BEDTIME. 04/22/14   Ethelda Chick, MD  ondansetron (ZOFRAN-ODT) 8 MG disintegrating tablet Take 1 tablet (8 mg total) by mouth every 8 (eight) hours as needed for nausea or vomiting. 02/28/23   Cathren Laine, MD  potassium chloride SA (KLOR-CON M) 20 MEQ tablet One po bid x 3 days, then one po once a day 02/28/23   Cathren Laine, MD  rosuvastatin (CRESTOR) 10 MG tablet Take 10 mg by mouth as directed. Take Mon-Fri Only. 03/29/22   [provider]  sodium chloride 1 g tablet Take 1 g by mouth 2 (two) times daily with a meal.    [provider]  valsartan (DIOVAN) 80 MG tablet Take 80 mg by mouth at bedtime. 03/07/22   [provider]  venlafaxine (EFFEXOR) 37.5 MG tablet Take 112.5 mg by mouth daily. 07/31/22   [provider]    Current Facility-Administered Medications  Medication Dose Route Frequency Provider Last Rate Last Admin   acetaminophen (TYLENOL) tablet 650 mg  650 mg Oral Q6H PRN Therisa Doyne, MD       Or   acetaminophen (TYLENOL) suppository 650 mg  650 mg Rectal Q6H PRN Doutova, Anastassia, MD       capsaicin (ZOSTRIX) 0.025 % cream   Topical Once Doutova, Anastassia, MD       cefTRIAXone (ROCEPHIN) 2 g in sodium chloride 0.9 % 100 mL IVPB  2 g Intravenous Q24H Doutova, Anastassia, MD   Stopped at 03/01/23 2210   doxycycline (VIBRAMYCIN) 100 mg in sodium chloride 0.9 % 250 mL IVPB  100 mg Intravenous Q12H Doutova, Anastassia, MD 125 mL/hr at 03/02/23 0848 100 mg at 03/02/23 0848   fentaNYL (SUBLIMAZE) injection 12.5-50 mcg  12.5-50 mcg Intravenous Q2H PRN Doutova, Jonny Ruiz, MD       HYDROcodone-acetaminophen (NORCO/VICODIN) 5-325 MG per tablet 1-2 tablet  1-2 tablet Oral Q4H PRN Therisa Doyne, MD   2 tablet at 03/02/23 0837   irbesartan (AVAPRO) tablet 75 mg  75 mg Oral Daily Doutova, Anastassia, MD   75 mg at 03/01/23 2140   lidocaine (LIDODERM) 5 % 1 patch  1 patch Transdermal Q24H Doutova, Anastassia, MD   1 patch at 03/01/23 1511   metoCLOPramide (REGLAN) injection 5 mg  5 mg Intravenous Q6H Doutova, Anastassia, MD   5 mg at 03/02/23 7867   metoprolol succinate (TOPROL-XL) 24 hr tablet 25 mg  25 mg Oral Daily Doutova, Anastassia, MD   25 mg at 03/02/23 0837   montelukast (SINGULAIR) tablet 10 mg  10 mg Oral QHS Doutova, Anastassia, MD   10 mg at 03/01/23 2144   nicotine (NICODERM CQ - dosed in mg/24 hours) patch 21 mg  21 mg Transdermal Daily Doutova, Anastassia, MD   21 mg at 03/02/23 0845   pantoprazole (PROTONIX) injection 40 mg  40 mg Intravenous Q12H Doutova, Anastassia, MD   40 mg at 03/02/23 0838   [START ON 03/03/2023] rosuvastatin (CRESTOR) tablet 10 mg  10 mg Oral Once per day on Mon Tue Wed Thu Fri Therisa Doyne, MD        Allergies as of 03/01/2023 -  Review Complete 03/01/2023  Allergen Reaction Noted   Avelox [moxifloxacin hcl in nacl] Hives 06/11/2011   Haldol [haloperidol lactate] Other (See Comments) 04/15/2022    Family History  Problem Relation Age of Onset   Heart disease Mother    Hypertension Father    Diabetes Paternal Grandfather    Heart disease Paternal Grandfather    Stroke Maternal Grandmother    Stroke Maternal Grandfather    Breast cancer Maternal Aunt        Age 76's    Social History   Socioeconomic History   Marital status: Single  Spouse name: Not on file   Number of children: Not on file   Years of education: Not on file   Highest education level: Not on file  Occupational History   Not on file  Tobacco Use   Smoking status: Every Day    Packs/day: 1.50    Years: 10.00    Additional pack years: 0.00    Total pack years: 15.00    Types: Cigarettes   Smokeless tobacco: Never  Vaping Use   Vaping Use: Some days  Substance and Sexual Activity   Alcohol use: No    Comment: rare   Drug use: Yes    Types: Marijuana    Comment: yes- will refrain x24 hours   Sexual activity: Yes    Birth control/protection: Post-menopausal  Other Topics Concern   Not on file  Social History Narrative   Not on file   Social Determinants of Health   Financial Resource Strain: Not on file  Food Insecurity: No Food Insecurity (12/25/2022)   Hunger Vital Sign    Worried About Running Out of Food in the Last Year: Never true    Ran Out of Food in the Last Year: Never true  Transportation Needs: No Transportation Needs (12/25/2022)   PRAPARE - Administrator, Civil Service (Medical): No    Lack of Transportation (Non-Medical): No  Physical Activity: Not on file  Stress: No Stress Concern Present (12/31/2022)   [employer] of Occupational Health - Occupational Stress Questionnaire    Feeling of Stress : Only a little  Social Connections: Not on file  Intimate Partner Violence: Not At Risk  (12/15/2022)   Humiliation, Afraid, Rape, and Kick questionnaire    Fear of Current or Ex-Partner: No    Emotionally Abused: No    Physically Abused: No    Sexually Abused: No    Review of Systems: As per HPI, all others negative  Physical Exam: Vital signs in last 24 hours: Temp:  [98 F (36.7 C)-99.5 F (37.5 C)] 98.1 F (36.7 C) (04/14 1229) Pulse Rate:  [74-96] 77 (04/14 1229) Resp:  [16-20] 18 (04/14 1229) BP: (144-175)/(87-96) 148/92 (04/14 1229) SpO2:  [98 %-100 %] 100 % (04/14 1229) Last BM Date : 03/01/23 General:   Alert,  Well-developed, well-nourished, pleasant and cooperative in NAD Head:  Normocephalic and atraumatic. Eyes:  Sclera clear, no icterus.   Conjunctiva pink. Ears:  Normal auditory acuity. Nose:  No deformity, discharge,  or lesions. Mouth:  No deformity or lesions.  Oropharynx pink but dry Neck:  Supple; no masses or thyromegaly. Lungs:  No respiratory distress Abdomen:  Soft, nontender and nondistended. No masses, hepatosplenomegaly or hernias noted. Normal bowel sounds, without guarding, and without rebound.     Msk:  Symmetrical without gross deformities. Normal posture. Pulses:  Normal pulses noted. Extremities:  Without clubbing or edema. Neurologic:  Alert and  oriented x4;  grossly normal neurologically. Skin:  Intact without significant lesions or rashes. Psych:  Alert and cooperative. Normal mood and affect.   Lab Results: Recent Labs    02/28/23 1914 03/01/23 1347 03/02/23 0050  WBC 20.3* 17.5* 15.7*  HGB 12.0 12.0 11.6*  HCT 34.4* 34.0* 33.6*  PLT 489* 454* 390   BMET Recent Labs    03/02/23 0050 03/02/23 0537 03/02/23 1024  NA 124* 126* 125*  K 3.1* 5.1 3.4*  CL 92* 93* 95*  CO2 20* 22 20*  GLUCOSE 117* 123* 178*  BUN 6* 6* 6*  CREATININE 0.58 0.66 0.57  CALCIUM 8.6* 9.2 8.9   LFT Recent Labs    03/02/23 0050  PROT 7.5  ALBUMIN 4.5  AST 30  ALT 22  ALKPHOS 69  BILITOT 0.8   PT/INR No results for input(s):  "LABPROT", "INR" in the last 72 hours.  Studies/Results: DG Chest Portable 1 View  Result Date: 03/01/2023 CLINICAL DATA:  Vomiting. EXAM: PORTABLE CHEST 1 VIEW COMPARISON:  February 27, 2023. FINDINGS: The heart size and mediastinal contours are within normal limits. Both lungs are clear. The visualized skeletal structures are unremarkable. IMPRESSION: No active disease. Electronically Signed   By: Lupita Raider M.D.   On: 03/01/2023 15:09   CT ABDOMEN PELVIS W CONTRAST  Result Date: 02/28/2023 CLINICAL DATA:  Nausea, vomiting, upper abdominal pain since last night. Bowel obstruction suspected. EXAM: CT ABDOMEN AND PELVIS WITH CONTRAST TECHNIQUE: Multidetector CT imaging of the abdomen and pelvis was performed using the standard protocol following bolus administration of intravenous contrast. RADIATION DOSE REDUCTION: This exam was performed according to the departmental dose-optimization program which includes automated exposure control, adjustment of the mA and/or kV according to patient size and/or use of iterative reconstruction technique. CONTRAST:  OMNIPAQUE IOHEXOL 300 MG/ML  SOLN COMPARISON:  CT 04/15/2022 FINDINGS: Lower chest: Partially visualized patchy ground-glass opacity in the anterior right middle lobe. Hepatobiliary: Hepatic steatosis. Unremarkable gallbladder and biliary tree. Pancreas: Unremarkable. Spleen: Unremarkable. Adrenals/Urinary Tract: Normal adrenal glands. No urinary calculi or hydronephrosis. Distended bladder. Stomach/Bowel: Normal caliber large and small bowel. No bowel wall thickening. The appendix is not definitively visualized. No secondary signs of appendicitis. Stomach is within normal limits. Vascular/Lymphatic: Aortic atherosclerosis. No enlarged abdominal or pelvic lymph nodes. Reproductive: Calcified fibroid in the uterus.  No adnexal mass. Other: No free intraperitoneal fluid or air. Musculoskeletal: Posterior fusion L4-L5 with interbody spacer. Thoracolumbar  spondylosis. No acute osseous abnormality. IMPRESSION: 1. No acute findings in the abdomen or pelvis. 2. Partially visualized patchy ground-glass opacity in the anterior right middle lobe suggestive of atypical infection. 3. Hepatic steatosis. 4. Fibroid uterus. Aortic Atherosclerosis (ICD10-I70.0). Electronically Signed   By: Minerva Fester M.D.   On: 02/28/2023 22:16    Impression:   Nausea, vomiting, recurrent.  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  Reports issues with recurrent nausea/vomiting due to migraine headaches for decades. Blood in emesis. Back pain with use of celecoxib. Hyponatremia and hypokalemia.  Plan:   Volume and electrolyte (NA, K) repletion. PPI. Soft diet ok. Antiemetics as needed. Endoscopy tomorrow. Risks (bleeding, infection, bowel perforation that could require surgery, sedation-related changes in cardiopulmonary systems), benefits (identification and possible treatment of source of symptoms, exclusion of certain causes of symptoms), and alternatives (watchful waiting, radiographic imaging studies, empiric medical treatment) of upper endoscopy (EGD) were explained to patient/family in detail and patient wishes to proceed.  [facility] will follow.   LOS: 1 day   Holly Pring M  03/02/2023, 1:09 PM  Cell [phone] If no answer or after 5 PM call [phone]

## 2023-03-02 NOTE — H&P (View-Only) (Signed)
[facility] Consultation Note  Referring Provider: [facility] Primary Care Physician:  Clelland, Olivia M, PA Primary Gastroenterologist:  Dr. Magod  Reason for Consultation:  nausea, vomiting, hematemesis  HPI: Katie Chang is a 61 y.o. female history of recurrent nausea/vomiting (cannabis hyperemesis, migraine headaches) presenting intractable nausea and vomiting with some bloody emesis.  No blood in stool or abdominal pain.  Tolerating clear liquids.  Takes celecoxib for back pain.  Prior endoscopy 2017 for vomiting by Dr. Johnson unrevealing.   Past Medical History:  Diagnosis Date   Allergy    Arthritis    Asthma    Cervical dysplasia    Cyclic vomiting syndrome    DDD (degenerative disc disease), cervical    Cervical and Lumbar   Depression    Eczema    GERD (gastroesophageal reflux disease)    Hypertension    Menopausal symptoms    Migraines    PONV (postoperative nausea and vomiting)    sometimes nausea   Recurrent sinus infections     Past Surgical History:  Procedure Laterality Date   CERVICAL BIOPSY     Dr. Gottsegen   COLONOSCOPY W/ POLYPECTOMY     COLPOSCOPY     ESOPHAGOGASTRODUODENOSCOPY (EGD) WITH PROPOFOL N/A 05/13/2016   Procedure: ESOPHAGOGASTRODUODENOSCOPY (EGD) WITH PROPOFOL;  Surgeon: Martin K Johnson, MD;  Location: [facility];  Service: Endoscopy;  Laterality: N/A;   KNEE SURGERY     NASAL SINUS SURGERY  1992   NECK SURGERY     TONSILLECTOMY  1990    Prior to Admission medications   Medication Sig Start Date End Date Taking? Authorizing Provider  albuterol (PROAIR HFA) 108 (90 BASE) MCG/ACT inhaler Inhale 2 puffs into the lungs every 6 (six) hours as needed for wheezing. 03/11/13   Dunn, Ryan M, PA-C  amLODipine (NORVASC) 5 MG tablet Take 5 mg by mouth daily. 03/29/22   [provider]  Capsaicin-Menthol-Methyl Sal (CAPSAICIN-METHYL SAL-MENTHOL) 0.025-1-12 % CREA Apply 1 Application topically 2 (two) times daily as  needed. Patient not taking: Reported on 12/25/2022 08/30/22   Gray, Samuel A, DO  celecoxib (CELEBREX) 200 MG capsule Take 200 mg by mouth daily. 05/23/22   [provider]  cholecalciferol (VITAMIN D3) 25 MCG (1000 UT) tablet Take 2,000 Units by mouth daily.    [provider]  esomeprazole (NEXIUM) 40 MG capsule Take 40 mg by mouth daily. 03/04/18   [provider]  fluticasone (FLONASE) 50 MCG/ACT nasal spray Place 2 sprays into both nostrils daily as needed for allergies.    [provider]  levocetirizine (XYZAL) 5 MG tablet Take 5 mg by mouth every evening.    [provider]  LORazepam (ATIVAN) 1 MG tablet Take 1 mg by mouth at bedtime. 04/09/18   [provider]  magnesium hydroxide (MILK OF MAGNESIA) 400 MG/5ML suspension Take 30 mLs by mouth daily as needed for mild constipation.    [provider]  metoCLOPramide (REGLAN) 10 MG tablet Take 1 tablet (10 mg total) by mouth every 6 (six) hours. 03/01/23   Long, Joshua G, MD  metoprolol succinate (TOPROL-XL) 25 MG 24 hr tablet Take 25 mg by mouth daily. 04/12/22   [provider]  montelukast (SINGULAIR) 10 MG tablet TAKE ONE TABLET AT BEDTIME. 04/22/14   Usman, Kristi M, MD  ondansetron (ZOFRAN-ODT) 8 MG disintegrating tablet Take 1 tablet (8 mg total) by mouth every 8 (eight) hours as needed for nausea or vomiting. 02/28/23   Steinl, Kevin, MD    potassium chloride SA (KLOR-CON M) 20 MEQ tablet One po bid x 3 days, then one po once a day 02/28/23   Steinl, Kevin, MD  rosuvastatin (CRESTOR) 10 MG tablet Take 10 mg by mouth as directed. Take Mon-Fri Only. 03/29/22   [provider]  sodium chloride 1 g tablet Take 1 g by mouth 2 (two) times daily with a meal.    [provider]  valsartan (DIOVAN) 80 MG tablet Take 80 mg by mouth at bedtime. 03/07/22   [provider]  venlafaxine (EFFEXOR) 37.5 MG tablet Take 112.5 mg by mouth daily. 07/31/22   [provider]    Current Facility-Administered Medications  Medication Dose Route Frequency Provider Last Rate Last Admin   acetaminophen (TYLENOL) tablet 650 mg  650 mg Oral Q6H PRN Doutova, Anastassia, MD       Or   acetaminophen (TYLENOL) suppository 650 mg  650 mg Rectal Q6H PRN Doutova, Anastassia, MD       capsaicin (ZOSTRIX) 0.025 % cream   Topical Once Doutova, Anastassia, MD       cefTRIAXone (ROCEPHIN) 2 g in sodium chloride 0.9 % 100 mL IVPB  2 g Intravenous Q24H Doutova, Anastassia, MD   Stopped at 03/01/23 2210   doxycycline (VIBRAMYCIN) 100 mg in sodium chloride 0.9 % 250 mL IVPB  100 mg Intravenous Q12H Doutova, Anastassia, MD 125 mL/hr at 03/02/23 0848 100 mg at 03/02/23 0848   fentaNYL (SUBLIMAZE) injection 12.5-50 mcg  12.5-50 mcg Intravenous Q2H PRN Doutova, Anastassia, MD       HYDROcodone-acetaminophen (NORCO/VICODIN) 5-325 MG per tablet 1-2 tablet  1-2 tablet Oral Q4H PRN Doutova, Anastassia, MD   2 tablet at 03/02/23 0837   irbesartan (AVAPRO) tablet 75 mg  75 mg Oral Daily Doutova, Anastassia, MD   75 mg at 03/01/23 2140   lidocaine (LIDODERM) 5 % 1 patch  1 patch Transdermal Q24H Doutova, Anastassia, MD   1 patch at 03/01/23 1511   metoCLOPramide (REGLAN) injection 5 mg  5 mg Intravenous Q6H Doutova, Anastassia, MD   5 mg at 03/02/23 0838   metoprolol succinate (TOPROL-XL) 24 hr tablet 25 mg  25 mg Oral Daily Doutova, Anastassia, MD   25 mg at 03/02/23 0837   montelukast (SINGULAIR) tablet 10 mg  10 mg Oral QHS Doutova, Anastassia, MD   10 mg at 03/01/23 2144   nicotine (NICODERM CQ - dosed in mg/24 hours) patch 21 mg  21 mg Transdermal Daily Doutova, Anastassia, MD   21 mg at 03/02/23 0845   pantoprazole (PROTONIX) injection 40 mg  40 mg Intravenous Q12H Doutova, Anastassia, MD   40 mg at 03/02/23 0838   [START ON 03/03/2023] rosuvastatin (CRESTOR) tablet 10 mg  10 mg Oral Once per day on Mon Tue Wed Thu Fri Doutova, Anastassia, MD        Allergies as of 03/01/2023 -  Review Complete 03/01/2023  Allergen Reaction Noted   Avelox [moxifloxacin hcl in nacl] Hives 06/11/2011   Haldol [haloperidol lactate] Other (See Comments) 04/15/2022    Family History  Problem Relation Age of Onset   Heart disease Mother    Hypertension Father    Diabetes Paternal Grandfather    Heart disease Paternal Grandfather    Stroke Maternal Grandmother    Stroke Maternal Grandfather    Breast cancer Maternal Aunt        Age 60's    Social History   Socioeconomic History   Marital status: Single      Spouse name: Not on file   Number of children: Not on file   Years of education: Not on file   Highest education level: Not on file  Occupational History   Not on file  Tobacco Use   Smoking status: Every Day    Packs/day: 1.50    Years: 10.00    Additional pack years: 0.00    Total pack years: 15.00    Types: Cigarettes   Smokeless tobacco: Never  Vaping Use   Vaping Use: Some days  Substance and Sexual Activity   Alcohol use: No    Comment: rare   Drug use: Yes    Types: Marijuana    Comment: yes- will refrain x24 hours   Sexual activity: Yes    Birth control/protection: Post-menopausal  Other Topics Concern   Not on file  Social History Narrative   Not on file   Social Determinants of Health   Financial Resource Strain: Not on file  Food Insecurity: No Food Insecurity (12/25/2022)   Hunger Vital Sign    Worried About Running Out of Food in the Last Year: Never true    Ran Out of Food in the Last Year: Never true  Transportation Needs: No Transportation Needs (12/25/2022)   PRAPARE - Transportation    Lack of Transportation (Medical): No    Lack of Transportation (Non-Medical): No  Physical Activity: Not on file  Stress: No Stress Concern Present (12/31/2022)   [employer] - Occupational Stress Questionnaire    Feeling of Stress : Only a little  Social Connections: Not on file  Intimate Partner Violence: Not At Risk  (12/15/2022)   Humiliation, Afraid, Rape, and Kick questionnaire    Fear of Current or Ex-Partner: No    Emotionally Abused: No    Physically Abused: No    Sexually Abused: No    Review of Systems: As per HPI, all others negative  Physical Exam: Vital signs in last 24 hours: Temp:  [98 F (36.7 C)-99.5 F (37.5 C)] 98.1 F (36.7 C) (04/14 1229) Pulse Rate:  [74-96] 77 (04/14 1229) Resp:  [16-20] 18 (04/14 1229) BP: (144-175)/(87-96) 148/92 (04/14 1229) SpO2:  [98 %-100 %] 100 % (04/14 1229) Last BM Date : 03/01/23 General:   Alert,  Well-developed, well-nourished, pleasant and cooperative in NAD Head:  Normocephalic and atraumatic. Eyes:  Sclera clear, no icterus.   Conjunctiva pink. Ears:  Normal auditory acuity. Nose:  No deformity, discharge,  or lesions. Mouth:  No deformity or lesions.  Oropharynx pink but dry Neck:  Supple; no masses or thyromegaly. Lungs:  No respiratory distress Abdomen:  Soft, nontender and nondistended. No masses, hepatosplenomegaly or hernias noted. Normal bowel sounds, without guarding, and without rebound.     Msk:  Symmetrical without gross deformities. Normal posture. Pulses:  Normal pulses noted. Extremities:  Without clubbing or edema. Neurologic:  Alert and  oriented x4;  grossly normal neurologically. Skin:  Intact without significant lesions or rashes. Psych:  Alert and cooperative. Normal mood and affect.   Lab Results: Recent Labs    02/28/23 1914 03/01/23 1347 03/02/23 0050  WBC 20.3* 17.5* 15.7*  HGB 12.0 12.0 11.6*  HCT 34.4* 34.0* 33.6*  PLT 489* 454* 390   BMET Recent Labs    03/02/23 0050 03/02/23 0537 03/02/23 1024  NA 124* 126* 125*  K 3.1* 5.1 3.4*  CL 92* 93* 95*  CO2 20* 22 20*  GLUCOSE 117* 123* 178*  BUN 6* 6* 6*    CREATININE 0.58 0.66 0.57  CALCIUM 8.6* 9.2 8.9   LFT Recent Labs    03/02/23 0050  PROT 7.5  ALBUMIN 4.5  AST 30  ALT 22  ALKPHOS 69  BILITOT 0.8   PT/INR No results for input(s):  "LABPROT", "INR" in the last 72 hours.  Studies/Results: DG Chest Portable 1 View  Result Date: 03/01/2023 CLINICAL DATA:  Vomiting. EXAM: PORTABLE CHEST 1 VIEW COMPARISON:  February 27, 2023. FINDINGS: The heart size and mediastinal contours are within normal limits. Both lungs are clear. The visualized skeletal structures are unremarkable. IMPRESSION: No active disease. Electronically Signed   By: James  Green Jr M.D.   On: 03/01/2023 15:09   CT ABDOMEN PELVIS W CONTRAST  Result Date: 02/28/2023 CLINICAL DATA:  Nausea, vomiting, upper abdominal pain since last night. Bowel obstruction suspected. EXAM: CT ABDOMEN AND PELVIS WITH CONTRAST TECHNIQUE: Multidetector CT imaging of the abdomen and pelvis was performed using the standard protocol following bolus administration of intravenous contrast. RADIATION DOSE REDUCTION: This exam was performed according to the departmental dose-optimization program which includes automated exposure control, adjustment of the mA and/or kV according to patient size and/or use of iterative reconstruction technique. CONTRAST:  100mL OMNIPAQUE IOHEXOL 300 MG/ML  SOLN COMPARISON:  CT 04/15/2022 FINDINGS: Lower chest: Partially visualized patchy ground-glass opacity in the anterior right middle lobe. Hepatobiliary: Hepatic steatosis. Unremarkable gallbladder and biliary tree. Pancreas: Unremarkable. Spleen: Unremarkable. Adrenals/Urinary Tract: Normal adrenal glands. No urinary calculi or hydronephrosis. Distended bladder. Stomach/Bowel: Normal caliber large and small bowel. No bowel wall thickening. The appendix is not definitively visualized. No secondary signs of appendicitis. Stomach is within normal limits. Vascular/Lymphatic: Aortic atherosclerosis. No enlarged abdominal or pelvic lymph nodes. Reproductive: Calcified fibroid in the uterus.  No adnexal mass. Other: No free intraperitoneal fluid or air. Musculoskeletal: Posterior fusion L4-L5 with interbody spacer. Thoracolumbar  spondylosis. No acute osseous abnormality. IMPRESSION: 1. No acute findings in the abdomen or pelvis. 2. Partially visualized patchy ground-glass opacity in the anterior right middle lobe suggestive of atypical infection. 3. Hepatic steatosis. 4. Fibroid uterus. Aortic Atherosclerosis (ICD10-I70.0). Electronically Signed   By: Tyler  Stutzman M.D.   On: 02/28/2023 22:16    Impression:   Nausea, vomiting, recurrent.  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  Reports issues with recurrent nausea/vomiting due to migraine headaches for decades. Blood in emesis. Back pain with use of celecoxib. Hyponatremia and hypokalemia.  Plan:   Volume and electrolyte (NA, K) repletion. PPI. Soft diet ok. Antiemetics as needed. Endoscopy tomorrow. Risks (bleeding, infection, bowel perforation that could require surgery, sedation-related changes in cardiopulmonary systems), benefits (identification and possible treatment of source of symptoms, exclusion of certain causes of symptoms), and alternatives (watchful waiting, radiographic imaging studies, empiric medical treatment) of upper endoscopy (EGD) were explained to patient/family in detail and patient wishes to proceed.  [facility] will follow.   LOS: 1 day   Katie Chang M  03/02/2023, 1:09 PM  Cell [phone] If no answer or after 5 PM call [phone]  

## 2023-03-02 NOTE — Progress Notes (Signed)
PROGRESS NOTE    Katie Chang  MAY:[id number] DOB: May 18, 1961 DOA: 03/01/2023 PCP: Delma Officer, PA   Brief Narrative: Katie Chang is a 62 y.o. female with a history of hypertension, hyperlipidemia, cyclic vomiting, marijuana use, anxiety, hyperlipidemia, GERD. Patient presented secondary to nausea/vomiting with associated hematemesis, hyponatremia and hypokalemia. GI consulted. Also with evidence of pneumonia; empiric antibiotics started.   Assessment and Plan:  Intractable nausea/vomiting Unclear etiology but possibly secondary to marijuana use. Improved. Patient is tolerating a clear liquid diet today.  Hematemesis In setting of intractable nausea/vomiting. Appears to be resolved. Patient is on Celebrex. GI consulted and plan upper endoscopy on 4/15.  Hyponatremia Sodium down to 123 on admission. Mild improvement with IV fluids. -Resume IV fluids -Repeat urine sodium/osmolality -Repeat sodium this evening  Community acquired pneumonia Productive cough with leukocytosis, infiltrate on chest x-ray imaging. Patient started on Ceftriaxone and azithromycin on admission.  Hypokalemia Secondary to GI losses. Potassium of 3.4 today today.  Hypomagnesemia Magnesium of 1.2 on admission. Resolved with repletion.  Marijuana use Noted. Discussed on admission  Tobacco use -Continue nicotine patch  Hyperlipidemia -Continue Crestor  Primary hypertension -Continue irbesartan and metoprolol  Prolonged QTc Likely secondary to electrolyte abnormalities -Continue telemetry  DVT prophylaxis: SCDs Code Status:   Code Status: DNR Family Communication: None at bedside Disposition Plan: Discharge home likely in 24 hours pending improvement of sodium and GI recommendations/management   Consultants:  [facility]  Procedures:  None  Antimicrobials: Ceftriaxone    Subjective: Patient reports feeling better this morning. Some nausea but no vomiting. Able to  tolerate liquid diet this morning.  Objective: BP (!) 148/92 (BP Location: Left Arm)   Pulse 77   Temp 98.1 F (36.7 C) (Axillary)   Resp 18   SpO2 100%   Examination:  General exam: Appears calm and comfortable Respiratory system: Clear to auscultation. Respiratory effort normal. Cardiovascular system: S1 & S2 heard, RRR. No murmurs, rubs, gallops or clicks. Gastrointestinal system: Abdomen is nondistended, soft and nontender. Normal bowel sounds heard. Central nervous system: Alert and oriented. No focal neurological deficits. Musculoskeletal: No edema. No calf tenderness Psychiatry: Judgement and insight appear normal. Mood & affect appropriate.    Data Reviewed: I have personally reviewed following labs and imaging studies  CBC Lab Results  Component Value Date   WBC 15.7 (H) 03/02/2023   RBC 4.47 03/02/2023   HGB 11.6 (L) 03/02/2023   HCT 33.6 (L) 03/02/2023   MCV 75.2 (L) 03/02/2023   MCH 26.0 03/02/2023   PLT 390 03/02/2023   MCHC 34.5 03/02/2023   RDW 15.3 03/02/2023   LYMPHSABS 3.9 03/01/2023   MONOABS 1.4 (H) 03/01/2023   EOSABS 0.1 03/01/2023   BASOSABS 0.1 03/01/2023     Last metabolic panel Lab Results  Component Value Date   NA 125 (L) 03/02/2023   K 3.4 (L) 03/02/2023   CL 95 (L) 03/02/2023   CO2 20 (L) 03/02/2023   BUN 6 (L) 03/02/2023   CREATININE 0.57 03/02/2023   GLUCOSE 178 (H) 03/02/2023   GFRNONAA >60 03/02/2023   GFRAA >60 11/12/2018   CALCIUM 8.9 03/02/2023   PHOS 3.2 03/02/2023   PROT 7.5 03/02/2023   ALBUMIN 4.5 03/02/2023   BILITOT 0.8 03/02/2023   ALKPHOS 69 03/02/2023   AST 30 03/02/2023   ALT 22 03/02/2023   ANIONGAP 10 03/02/2023    GFR: Estimated Creatinine Clearance: 70.6 mL/min (by C-G formula based on SCr of 0.57 mg/dL).  Recent Results (from the  past 240 hour(s))  Respiratory (~20 pathogens) panel by PCR     Status: None   Collection Time: 03/01/23 10:27 PM   Specimen: Nasopharyngeal Swab; Respiratory  Result  Value Ref Range Status   Adenovirus NOT DETECTED NOT DETECTED Final   Coronavirus 229E NOT DETECTED NOT DETECTED Final    Comment: (NOTE) The Coronavirus on the Respiratory Panel, DOES NOT test for the novel  Coronavirus (2019 nCoV)    Coronavirus HKU1 NOT DETECTED NOT DETECTED Final   Coronavirus NL63 NOT DETECTED NOT DETECTED Final   Coronavirus OC43 NOT DETECTED NOT DETECTED Final   Metapneumovirus NOT DETECTED NOT DETECTED Final   Rhinovirus / Enterovirus NOT DETECTED NOT DETECTED Final   Influenza A NOT DETECTED NOT DETECTED Final   Influenza B NOT DETECTED NOT DETECTED Final   Parainfluenza Virus 1 NOT DETECTED NOT DETECTED Final   Parainfluenza Virus 2 NOT DETECTED NOT DETECTED Final   Parainfluenza Virus 3 NOT DETECTED NOT DETECTED Final   Parainfluenza Virus 4 NOT DETECTED NOT DETECTED Final   Respiratory Syncytial Virus NOT DETECTED NOT DETECTED Final   Bordetella pertussis NOT DETECTED NOT DETECTED Final   Bordetella Parapertussis NOT DETECTED NOT DETECTED Final   Chlamydophila pneumoniae NOT DETECTED NOT DETECTED Final   Mycoplasma pneumoniae NOT DETECTED NOT DETECTED Final    Comment: Performed at [facility], [address] [place], [place] [address]  SARS Coronavirus 2 by RT PCR (hospital order, performed in [facility] hospital lab) *cepheid single result test* Anterior Nasal Swab     Status: None   Collection Time: 03/01/23 10:27 PM   Specimen: Anterior Nasal Swab  Result Value Ref Range Status   SARS Coronavirus 2 by RT PCR NEGATIVE NEGATIVE Final    Comment: (NOTE) SARS-CoV-2 target nucleic acids are NOT DETECTED.  The SARS-CoV-2 RNA is generally detectable in upper and lower respiratory specimens during the acute phase of infection. The lowest concentration of SARS-CoV-2 viral copies this assay can detect is 250 copies / mL. A negative result does not preclude SARS-CoV-2 infection and should not be used as the sole basis for treatment or other patient  management decisions.  A negative result may occur with improper specimen collection / handling, submission of specimen other than nasopharyngeal swab, presence of viral mutation(s) within the areas targeted by this assay, and inadequate number of viral copies (<250 copies / mL). A negative result must be combined with clinical observations, patient history, and epidemiological information.  Fact Sheet for Patients:   RoadLapTop.co.za  Fact Sheet for Healthcare Providers: http://kim-miller.com/  This test is not yet approved or  cleared by the [place] FDA and has been authorized for detection and/or diagnosis of SARS-CoV-2 by FDA under an Emergency Use Authorization (EUA).  This EUA will remain in effect (meaning this test can be used) for the duration of the COVID-19 declaration under Section 564(b)(1) of the Act, 21 U.S.C. section 360bbb-3(b)(1), unless the authorization is terminated or revoked sooner.  Performed at [facility], [address] [place], [place] [address]       Radiology Studies: DG Chest Portable 1 View  Result Date: 03/01/2023 CLINICAL DATA:  Vomiting. EXAM: PORTABLE CHEST 1 VIEW COMPARISON:  February 27, 2023. FINDINGS: The heart size and mediastinal contours are within normal limits. Both lungs are clear. The visualized skeletal structures are unremarkable. IMPRESSION: No active disease. Electronically Signed   By: Lupita Raider M.D.   On: 03/01/2023 15:09   CT ABDOMEN  PELVIS W CONTRAST  Result Date: 02/28/2023 CLINICAL DATA:  Nausea, vomiting, upper abdominal pain since last night. Bowel obstruction suspected. EXAM: CT ABDOMEN AND PELVIS WITH CONTRAST TECHNIQUE: Multidetector CT imaging of the abdomen and pelvis was performed using the standard protocol following bolus administration of intravenous contrast. RADIATION DOSE REDUCTION: This exam was performed according to the departmental  dose-optimization program which includes automated exposure control, adjustment of the mA and/or kV according to patient size and/or use of iterative reconstruction technique. CONTRAST:  OMNIPAQUE IOHEXOL 300 MG/ML  SOLN COMPARISON:  CT 04/15/2022 FINDINGS: Lower chest: Partially visualized patchy ground-glass opacity in the anterior right middle lobe. Hepatobiliary: Hepatic steatosis. Unremarkable gallbladder and biliary tree. Pancreas: Unremarkable. Spleen: Unremarkable. Adrenals/Urinary Tract: Normal adrenal glands. No urinary calculi or hydronephrosis. Distended bladder. Stomach/Bowel: Normal caliber large and small bowel. No bowel wall thickening. The appendix is not definitively visualized. No secondary signs of appendicitis. Stomach is within normal limits. Vascular/Lymphatic: Aortic atherosclerosis. No enlarged abdominal or pelvic lymph nodes. Reproductive: Calcified fibroid in the uterus.  No adnexal mass. Other: No free intraperitoneal fluid or air. Musculoskeletal: Posterior fusion L4-L5 with interbody spacer. Thoracolumbar spondylosis. No acute osseous abnormality. IMPRESSION: 1. No acute findings in the abdomen or pelvis. 2. Partially visualized patchy ground-glass opacity in the anterior right middle lobe suggestive of atypical infection. 3. Hepatic steatosis. 4. Fibroid uterus. Aortic Atherosclerosis (ICD10-I70.0). Electronically Signed   By: Minerva Fester M.D.   On: 02/28/2023 22:16      LOS: 1 day    Jacquelin Hawking, MD [facility] 03/02/2023, 2:34 PM   If 7PM-7AM, please contact night-coverage [email or web address]

## 2023-03-03 ENCOUNTER — Encounter (HOSPITAL_COMMUNITY)
Admission: EM | Disposition: A | Discharge status: Home / Self Care | Payer: Self-pay | Source: Home / Self Care | Attending: Family Medicine

## 2023-03-03 ENCOUNTER — Inpatient Hospital Stay (HOSPITAL_COMMUNITY): Payer: BC Managed Care – PPO | Admitting: Certified Registered Nurse Anesthetist

## 2023-03-03 ENCOUNTER — Encounter (HOSPITAL_COMMUNITY): Payer: Self-pay | Admitting: Internal Medicine

## 2023-03-03 DIAGNOSIS — R1115 Cyclical vomiting syndrome unrelated to migraine: Secondary | ICD-10-CM | POA: Diagnosis not present

## 2023-03-03 HISTORY — PX: ESOPHAGOGASTRODUODENOSCOPY (EGD) WITH PROPOFOL: SHX5813

## 2023-03-03 HISTORY — PX: BIOPSY: SHX5522

## 2023-03-03 LAB — BASIC METABOLIC PANEL
Anion gap: 9 (ref 5–15)
BUN: 8 mg/dL (ref 8–23)
CO2: 20 mmol/L — ABNORMAL LOW (ref 22–32)
Calcium: 8.7 mg/dL — ABNORMAL LOW (ref 8.9–10.3)
Chloride: 98 mmol/L (ref 98–111)
Creatinine, Ser: 0.57 mg/dL (ref 0.44–1.00)
GFR, Estimated: >60 mL/min (ref >60)
Glucose, Bld: 101 mg/dL — ABNORMAL HIGH (ref 70–99)
Potassium: 3.5 mmol/L (ref 3.5–5.1)
Sodium: 127 mmol/L — ABNORMAL LOW (ref 135–145)

## 2023-03-03 LAB — OSMOLALITY, URINE
Osmolality, Ur: 226 mOsm/kg — ABNORMAL LOW (ref 300–900)
Osmolality, Ur: 274 mOsm/kg — ABNORMAL LOW (ref 300–900)

## 2023-03-03 LAB — SODIUM, URINE, RANDOM: Sodium, Ur: 77 mmol/L

## 2023-03-03 SURGERY — ESOPHAGOGASTRODUODENOSCOPY (EGD) WITH PROPOFOL
Anesthesia: Monitor Anesthesia Care

## 2023-03-03 MED ORDER — LEVOCETIRIZINE DIHYDROCHLORIDE 5 MG PO TABS: 2.5000 mg | ORAL_TABLET | Freq: Every evening | ORAL | Status: AC

## 2023-03-03 MED ORDER — PROPOFOL 500 MG/50ML IV EMUL
INTRAVENOUS | Status: DC | PRN
  Administered 2023-03-03: 125 ug/kg/min via INTRAVENOUS

## 2023-03-03 MED ORDER — DOXYCYCLINE HYCLATE 100 MG PO CAPS: 100.0000 mg | ORAL_CAPSULE | Freq: Two times a day (BID) | ORAL | 0 refills | Status: AC

## 2023-03-03 MED ORDER — LIDOCAINE 2% (20 MG/ML) 5 ML SYRINGE
INTRAMUSCULAR | Status: DC | PRN
  Administered 2023-03-03: 80 mg via INTRAVENOUS

## 2023-03-03 MED ORDER — PROPOFOL 1000 MG/100ML IV EMUL
INTRAVENOUS | Status: AC
  Filled 2023-03-03: qty 100

## 2023-03-03 MED ORDER — SODIUM CHLORIDE 0.9 % IV SOLN: INTRAVENOUS | Status: DC

## 2023-03-03 MED ORDER — SODIUM CHLORIDE 1 G PO TABS
1.0000 g | ORAL_TABLET | Freq: Once | ORAL | Status: AC
  Administered 2023-03-03: 1 g via ORAL
  Filled 2023-03-03: qty 1

## 2023-03-03 MED ORDER — PROPOFOL 10 MG/ML IV BOLUS
INTRAVENOUS | Status: DC | PRN
  Administered 2023-03-03 (×2): 20 mg via INTRAVENOUS

## 2023-03-03 MED ORDER — PANTOPRAZOLE SODIUM 40 MG PO TBEC
40.0000 mg | DELAYED_RELEASE_TABLET | Freq: Every day | ORAL | Status: DC
  Administered 2023-03-03: 40 mg via ORAL
  Filled 2023-03-03: qty 1

## 2023-03-03 MED ORDER — CEFDINIR 300 MG PO CAPS: 300.0000 mg | ORAL_CAPSULE | Freq: Two times a day (BID) | ORAL | 0 refills | Status: AC

## 2023-03-03 MED ORDER — SODIUM CHLORIDE 1 G PO TABS: 1.0000 g | ORAL_TABLET | Freq: Two times a day (BID) | ORAL | 0 refills | Status: AC

## 2023-03-03 SURGICAL SUPPLY — 15 items

## 2023-03-03 NOTE — Anesthesia Procedure Notes (Signed)
Procedure Name: MAC Date/Time: 03/03/2023 8:14 AM  Performed by: Wynonia Sours, CRNAPre-anesthesia Checklist: Patient identified, Emergency Drugs available, Suction available, Patient being monitored and Timeout performed Patient Re-evaluated:Patient Re-evaluated prior to induction Oxygen Delivery Method: Simple face mask Preoxygenation: Pre-oxygenation with 100% oxygen Placement Confirmation: positive ETCO2 Dental Injury: Teeth and Oropharynx as per pre-operative assessment

## 2023-03-03 NOTE — Interval H&P Note (Signed)
History and Physical Interval Note:  03/03/2023 8:08 AM  Carren Rang  has presented today for surgery, with the diagnosis of nausea, vomiting, hematemesis.  The various methods of treatment have been discussed with the patient and family. After consideration of risks, benefits and other options for treatment, the patient has consented to  Procedure(s): ESOPHAGOGASTRODUODENOSCOPY (EGD) WITH PROPOFOL (N/A) as a surgical intervention.  The patient's history has been reviewed, patient examined, no change in status, stable for surgery.  I have reviewed the patient's chart and labs.  Questions were answered to the patient's satisfaction.     Karilyn Wind

## 2023-03-03 NOTE — Discharge Summary (Signed)
Physician Discharge Summary   Patient: Katie Chang MRN: [id number] DOB: 30-Mar-1961  Admit date:     03/01/2023  Discharge date: 03/03/23  Discharge Physician: Jacquelin Hawking, MD   PCP: Delma Officer, PA   Recommendations at discharge:  PCP follow-up GI follow-up for biopsy results Repeat BMP in 3 days  Discharge Diagnoses: Principal Problem:   Intractable cyclical vomiting with nausea Active Problems:   Asthma   Hypokalemia   Dehydration   Hypomagnesemia   Hyponatremia   Prolonged QT interval   Cannabis use disorder   Hematemesis   Hyperlipidemia   Essential hypertension   Tobacco abuse   CAP (community acquired pneumonia)  Resolved Problems:   * No resolved hospital problems. *  Hospital Course: Katie Chang is a 62 y.o. female with a history of hypertension, hyperlipidemia, cyclic vomiting, marijuana use, anxiety, hyperlipidemia, GERD. Patient presented secondary to nausea/vomiting with associated hematemesis, hyponatremia and hypokalemia. GI consulted. Also with evidence of pneumonia; empiric antibiotics started with improvement of symptoms. Hyponatremia improved with IV fluids. Upper endoscopy performed on 4/15 and was significant for gastritis; biopsy performed.  Assessment and Plan:  Intractable nausea/vomiting Unclear etiology but possibly secondary to marijuana use. Patient tolerated advancement of diet. Nausea and vomiting resolved.   Hematemesis In setting of intractable nausea/vomiting. Appears to be resolved. Patient is on Celebrex. GI consulted and performed an upper endoscopy on 4/15 which was significant for gastritis; biopsy performed.   Hyponatremia Chronic hyponatremia with sodium between 127-128. Sodium down to 123 on admission. Improvement with IV fluids up to 127 and stable.   Community acquired pneumonia Productive cough with leukocytosis, infiltrate on chest x-ray imaging. Patient started on Ceftriaxone and doxycycline on admission. Patient  transitioned to Cefdinir and doxycycline for discharge.   Hypokalemia Secondary to GI losses. Resolved with supplementation.   Hypomagnesemia Magnesium of 1.2 on admission. Resolved with repletion.   Marijuana use Noted. Discussed cessation on admission.   Tobacco use Continue nicotine patch while inpatient. Cessation discussed.   Hyperlipidemia Continue Crestor.   Primary hypertension Continue irbesartan and metoprolol.   Prolonged QTc Mild. Likely secondary to electrolyte abnormalities. Patient monitored on telemetry.   Consultants: [facility] Procedures performed:  4/15: Upper endoscopy   Disposition: Home Diet recommendation: Soft diet   DISCHARGE MEDICATION: Allergies as of 03/03/2023       Reactions   Avelox [moxifloxacin Hcl In Nacl] Hives   No allergic reaction to Levaquin.   Haldol [haloperidol Lactate] Other (See Comments)   "Night terrors"        Medication List     STOP taking these medications    potassium chloride SA 20 MEQ tablet Commonly known as: KLOR-CON M       TAKE these medications    albuterol 108 (90 Base) MCG/ACT inhaler Commonly known as: ProAir HFA Inhale 2 puffs into the lungs every 6 (six) hours as needed for wheezing.   amLODipine 5 MG tablet Commonly known as: NORVASC Take 5 mg by mouth daily.   cefdinir 300 MG capsule Commonly known as: OMNICEF Take 1 capsule (300 mg total) by mouth 2 (two) times daily for 3 days.   celecoxib 200 MG capsule Commonly known as: CELEBREX Take 200 mg by mouth daily.   cholecalciferol 25 MCG (1000 UNIT) tablet Commonly known as: VITAMIN D3 Take 2,000 Units by mouth daily.   doxycycline 100 MG capsule Commonly known as: VIBRAMYCIN Take 1 capsule (100 mg total) by mouth 2 (two) times daily for 3 days.  esomeprazole 40 MG capsule Commonly known as: NEXIUM Take 40 mg by mouth daily.   fluticasone 50 MCG/ACT nasal spray Commonly known as: FLONASE Place 2 sprays  into both nostrils daily as needed for allergies.   levocetirizine 5 MG tablet Commonly known as: XYZAL Take 0.5 tablets (2.5 mg total) by mouth every evening. What changed: how much to take   LORazepam 1 MG tablet Commonly known as: ATIVAN Take 1 mg by mouth at bedtime.   magnesium hydroxide 400 MG/5ML suspension Commonly known as: MILK OF MAGNESIA Take 30 mLs by mouth at bedtime.   metoCLOPramide 10 MG tablet Commonly known as: REGLAN Take 1 tablet (10 mg total) by mouth every 6 (six) hours.   metoprolol succinate 25 MG 24 hr tablet Commonly known as: TOPROL-XL Take 25 mg by mouth daily.   montelukast 10 MG tablet Commonly known as: SINGULAIR TAKE ONE TABLET AT BEDTIME. What changed: when to take this   ondansetron 8 MG disintegrating tablet Commonly known as: ZOFRAN-ODT Take 1 tablet (8 mg total) by mouth every 8 (eight) hours as needed for nausea or vomiting.   rosuvastatin 10 MG tablet Commonly known as: CRESTOR Take 10 mg by mouth See admin instructions. Take  by mouth Monday thru Friday.   sodium chloride 1 g tablet Take 1 tablet (1 g total) by mouth 2 (two) times daily with a meal for 2 days.   valsartan 80 MG tablet Commonly known as: DIOVAN Take 80 mg by mouth at bedtime.   venlafaxine 37.5 MG tablet Commonly known as: EFFEXOR Take 112.5 mg by mouth daily.        Follow-up Information     Delma Officer, [place]. Schedule an appointment as soon as possible for a visit in 3 day(s).   Specialty: Internal Medicine Why: For hospital follow-up. Repeat labs Contact information: [address] [place] [address] [phone]                Discharge Exam: BP (!) 166/81 (BP Location: Right Arm)   Pulse 74   Temp 97.9 F (36.6 C) (Oral)   Resp 18   Ht  (1.6 m)   Wt 69.9 kg   SpO2 100%   BMI 27.30 kg/m   General exam: Appears calm and comfortable Respiratory system: Clear to auscultation. Respiratory effort  normal. Cardiovascular system: S1 & S2 heard, RRR. No murmurs, rubs, gallops or clicks. Gastrointestinal system: Abdomen is nondistended, soft and nontender. Normal bowel sounds heard. Central nervous system: Alert and oriented. No focal neurological deficits. Musculoskeletal: No edema. No calf tenderness Skin: No cyanosis. No rashes Psychiatry: Judgement and insight appear normal. Mood & affect appropriate.   Condition at discharge: stable  The results of significant diagnostics from this hospitalization (including imaging, microbiology, ancillary and laboratory) are listed below for reference.   Imaging Studies: DG Chest Portable 1 View  Result Date: 03/01/2023 CLINICAL DATA:  Vomiting. EXAM: PORTABLE CHEST 1 VIEW COMPARISON:  February 27, 2023. FINDINGS: The heart size and mediastinal contours are within normal limits. Both lungs are clear. The visualized skeletal structures are unremarkable. IMPRESSION: No active disease. Electronically Signed   By: Lupita Raider M.D.   On: 03/01/2023 15:09   CT ABDOMEN PELVIS W CONTRAST  Result Date: 02/28/2023 CLINICAL DATA:  Nausea, vomiting, upper abdominal pain since last night. Bowel obstruction suspected. EXAM: CT ABDOMEN AND PELVIS WITH CONTRAST TECHNIQUE: Multidetector CT imaging of the abdomen and pelvis was performed using the standard  protocol following bolus administration of intravenous contrast. RADIATION DOSE REDUCTION: This exam was performed according to the departmental dose-optimization program which includes automated exposure control, adjustment of the mA and/or kV according to patient size and/or use of iterative reconstruction technique. CONTRAST:  OMNIPAQUE IOHEXOL 300 MG/ML  SOLN COMPARISON:  CT 04/15/2022 FINDINGS: Lower chest: Partially visualized patchy ground-glass opacity in the anterior right middle lobe. Hepatobiliary: Hepatic steatosis. Unremarkable gallbladder and biliary tree. Pancreas: Unremarkable. Spleen:  Unremarkable. Adrenals/Urinary Tract: Normal adrenal glands. No urinary calculi or hydronephrosis. Distended bladder. Stomach/Bowel: Normal caliber large and small bowel. No bowel wall thickening. The appendix is not definitively visualized. No secondary signs of appendicitis. Stomach is within normal limits. Vascular/Lymphatic: Aortic atherosclerosis. No enlarged abdominal or pelvic lymph nodes. Reproductive: Calcified fibroid in the uterus.  No adnexal mass. Other: No free intraperitoneal fluid or air. Musculoskeletal: Posterior fusion L4-L5 with interbody spacer. Thoracolumbar spondylosis. No acute osseous abnormality. IMPRESSION: 1. No acute findings in the abdomen or pelvis. 2. Partially visualized patchy ground-glass opacity in the anterior right middle lobe suggestive of atypical infection. 3. Hepatic steatosis. 4. Fibroid uterus. Aortic Atherosclerosis (ICD10-I70.0). Electronically Signed   By: Minerva Fester M.D.   On: 02/28/2023 22:16   DG Chest 2 View  Result Date: 02/27/2023 CLINICAL DATA:  severe vomiting EXAM: CHEST - 2 VIEW COMPARISON:  Chest x-ray 08/11/2020 FINDINGS: The heart and mediastinal contours are within normal limits. No focal consolidation. No pulmonary edema. No pleural effusion. No pneumothorax. No acute osseous abnormality. IMPRESSION: No active cardiopulmonary disease. Electronically Signed   By: Tish Frederickson M.D.   On: 02/27/2023 14:18    Microbiology: Results for orders placed or performed during the hospital encounter of 03/01/23  Respiratory (~20 pathogens) panel by PCR     Status: None   Collection Time: 03/01/23 10:27 PM   Specimen: Nasopharyngeal Swab; Respiratory  Result Value Ref Range Status   Adenovirus NOT DETECTED NOT DETECTED Final   Coronavirus 229E NOT DETECTED NOT DETECTED Final    Comment: (NOTE) The Coronavirus on the Respiratory Panel, DOES NOT test for the novel  Coronavirus (2019 nCoV)    Coronavirus HKU1 NOT DETECTED NOT DETECTED Final    Coronavirus NL63 NOT DETECTED NOT DETECTED Final   Coronavirus OC43 NOT DETECTED NOT DETECTED Final   Metapneumovirus NOT DETECTED NOT DETECTED Final   Rhinovirus / Enterovirus NOT DETECTED NOT DETECTED Final   Influenza A NOT DETECTED NOT DETECTED Final   Influenza B NOT DETECTED NOT DETECTED Final   Parainfluenza Virus 1 NOT DETECTED NOT DETECTED Final   Parainfluenza Virus 2 NOT DETECTED NOT DETECTED Final   Parainfluenza Virus 3 NOT DETECTED NOT DETECTED Final   Parainfluenza Virus 4 NOT DETECTED NOT DETECTED Final   Respiratory Syncytial Virus NOT DETECTED NOT DETECTED Final   Bordetella pertussis NOT DETECTED NOT DETECTED Final   Bordetella Parapertussis NOT DETECTED NOT DETECTED Final   Chlamydophila pneumoniae NOT DETECTED NOT DETECTED Final   Mycoplasma pneumoniae NOT DETECTED NOT DETECTED Final    Comment: Performed at [facility], [address], [place], [place] [address]  SARS Coronavirus 2 by RT PCR (hospital order, performed in [facility] hospital lab) *cepheid single result test* Anterior Nasal Swab     Status: None   Collection Time: 03/01/23 10:27 PM   Specimen: Anterior Nasal Swab  Result Value Ref Range Status   SARS Coronavirus 2 by RT PCR NEGATIVE NEGATIVE Final    Comment: (NOTE) SARS-CoV-2 target nucleic acids are  NOT DETECTED.  The SARS-CoV-2 RNA is generally detectable in upper and lower respiratory specimens during the acute phase of infection. The lowest concentration of SARS-CoV-2 viral copies this assay can detect is 250 copies / mL. A negative result does not preclude SARS-CoV-2 infection and should not be used as the sole basis for treatment or other patient management decisions.  A negative result may occur with improper specimen collection / handling, submission of specimen other than nasopharyngeal swab, presence of viral mutation(s) within the areas targeted by this assay, and inadequate number of viral copies (<250 copies / mL). A negative  result must be combined with clinical observations, patient history, and epidemiological information.  Fact Sheet for Patients:   RoadLapTop.co.za  Fact Sheet for Healthcare Providers: http://kim-miller.com/  This test is not yet approved or  cleared by the [place] FDA and has been authorized for detection and/or diagnosis of SARS-CoV-2 by FDA under an Emergency Use Authorization (EUA).  This EUA will remain in effect (meaning this test can be used) for the duration of the COVID-19 declaration under Section 564(b)(1) of the Act, 21 U.S.C. section 360bbb-3(b)(1), unless the authorization is terminated or revoked sooner.  Performed at [facility], [address] [place], [place] [address]     Labs: CBC: Recent Labs  Lab 02/27/23 1247 02/28/23 0653 02/28/23 1914 03/01/23 1347 03/02/23 0050  WBC 10.9* 21.8* 20.3* 17.5* 15.7*  NEUTROABS 9.0*  --   --  11.8*  --   HGB 13.0 12.8 12.0 12.0 11.6*  HCT 38.1 36.5 34.4* 34.0* 33.6*  MCV 76.7* 73.6* 73.0* 72.8* 75.2*  PLT 488* 512* 489* 454* 390   Basic Metabolic Panel: Recent Labs  Lab 02/27/23 1247 02/28/23 0653 03/01/23 1347 03/01/23 1825 03/01/23 2101 03/02/23 0050 03/02/23 0537 03/02/23 1024 03/02/23 1507 03/02/23 2028 03/03/23 0714  NA 137   < > 123*   < >  --  124* 126* 125* 126* 127* 127*  K 3.6   < > 2.7*   < >  --  3.1* 5.1 3.4* 3.0*  --  3.5  CL 103   < > 88*   < >  --  92* 93* 95* 96*  --  98  CO2 19*   < > 18*   < >  --  20* 22 20* 21*  --  20*  GLUCOSE 157*   < > 122*   < >  --  117* 123* 178* 130*  --  101*  BUN 9   < > 7*   < >  --  6* 6* 6* 6*  --  8  CREATININE 0.76   < > 0.71   < >  --  0.58 0.66 0.57 0.67  --  0.57  CALCIUM 9.8   < > 9.9   < >  --  8.6* 9.2 8.9 8.6*  --  8.7*  MG 1.9  --  1.2*  --  2.1 2.1  --   --   --   --   --   PHOS  --   --   --   --  2.8 3.2  --   --   --   --   --    < > = values in this interval not  displayed.   Liver Function Tests: Recent Labs  Lab 02/27/23 1247 02/28/23 0653 02/28/23 1914 03/01/23 1347 03/02/23 0050  AST 22 23 34 34 30  ALT 15 12 15 18  22  ALKPHOS 74 72 70 72 69  BILITOT 0.8 1.2 1.2 1.2 0.8  PROT 8.1 7.7 7.4 7.4 7.5  ALBUMIN 4.6 5.1* 5.0 4.8 4.5    Discharge time spent: 35 minutes.  Signed: Jacquelin Hawking, MD [facility] 03/03/2023

## 2023-03-03 NOTE — Brief Op Note (Signed)
03/01/2023 - 03/03/2023  8:40 AM  PATIENT:  Katie Chang  62 y.o. female  PRE-OPERATIVE DIAGNOSIS:  nausea, vomiting, hematemesis  POST-OPERATIVE DIAGNOSIS:  gastritis  PROCEDURE:  Procedure(s): ESOPHAGOGASTRODUODENOSCOPY (EGD) WITH PROPOFOL (N/A) BIOPSY  SURGEON:  Surgeon(s) and Role:    * Kaydence Menard, MD - Primary  Findings ----------- -EGD showed mild gastritis otherwise no evidence of active bleeding, ulceration or esophagitis.  Biopsies taken.  Recommendations ----------------------- -Start soft diet and advance as tolerated -Change IV Protonix to p.o. Protonix 40 mg once a day -Counseled regarding cannabinoid use and its role in nausea and vomiting.  Recommend to avoid any cannabinoid substance. -No further inpatient GI workup planned.  GI will sign off.  Call us back if needed  Kathi Der MD, FACP 03/03/2023, 8:43 AM  Contact #  [phone]

## 2023-03-03 NOTE — Transfer of Care (Signed)
Immediate Anesthesia Transfer of Care Note  Patient: Katie Chang  Procedure(s) Performed: ESOPHAGOGASTRODUODENOSCOPY (EGD) WITH PROPOFOL BIOPSY  Patient Location: PACU and Endoscopy Unit  Anesthesia Type:MAC  Level of Consciousness: awake, alert , and patient cooperative  Airway & Oxygen Therapy: Patient Spontanous Breathing and Patient connected to nasal cannula oxygen  Post-op Assessment: Report given to RN and Post -op Vital signs reviewed and stable  Post vital signs: Reviewed and stable  Last Vitals:  Vitals Value Taken Time  BP 129/64 03/03/23 0832  Temp    Pulse 81 03/03/23 0833  Resp 20 03/03/23 0833  SpO2 100 % 03/03/23 0833  Vitals shown include unvalidated device data.  Last Pain:  Vitals:   03/03/23 0725  TempSrc: Temporal  PainSc: 0-No pain      Patients Stated Pain Goal: 1 (03/03/23 0325)  Complications: No notable events documented.

## 2023-03-03 NOTE — Progress Notes (Signed)
AVS and discharge instructions reviewed w/ patient. Patient verbalized understanding and had no further questions. 

## 2023-03-03 NOTE — Plan of Care (Signed)

## 2023-03-03 NOTE — Discharge Instructions (Signed)
Katie Chang,  You were in the hospital because of a pneumonia which has improved with antibiotics. You also had some blood vomiting and were found to have gastritis on your exam; please follow-up with the GI for results of your biopsy. You also had low sodium likely related to your vomiting, but you also have chronic sodium issues. Please take the salt tablets for the next couple of days and follow-up with your primary care physician. It is okay to keep hydrated but please limit your intake to about 2 liters per day until you see your primary care physician; you will need a repeat blood test in about 3 days.

## 2023-03-03 NOTE — Op Note (Signed)
[facility] Patient Name: Katie Chang Procedure Date: 03/03/2023 MRN: [id number] Attending MD: Kathi Der , MD, [id number] Date of Birth: 1961-08-27 CSN: [id number] Age: 62 Admit Type: Inpatient Procedure:                Upper GI endoscopy Indications:              Hematemesis, Nausea with vomiting Providers:                Kathi Der, MD, Pollie Friar RN, RN,                            Sunday Corn Mbumina, Technician Referring MD:              Medicines:                Sedation Administered by an Anesthesia Professional Complications:            No immediate complications. Estimated Blood Loss:     Estimated blood loss was minimal. Procedure:                Pre-Anesthesia Assessment:                           - Prior to the procedure, a History and Physical                            was performed, and patient medications and                            allergies were reviewed. The patient's tolerance of                            previous anesthesia was also reviewed. The risks                            and benefits of the procedure and the sedation                            options and risks were discussed with the patient.                            All questions were answered, and informed consent                            was obtained. Prior Anticoagulants: The patient has                            taken no anticoagulant or antiplatelet agents. ASA                            Grade Assessment: III - A patient with severe                            systemic disease. After reviewing the risks and  benefits, the patient was deemed in satisfactory                            condition to undergo the procedure.                           After obtaining informed consent, the endoscope was                            passed under direct vision. Throughout the                            procedure, the patient's blood pressure, pulse,  and                            oxygen saturations were monitored continuously. The                            [id number]-[id number] ([id number]) Olympus endoscope was introduced                            through the mouth, and advanced to the second part                            of duodenum. The upper GI endoscopy was                            accomplished without difficulty. The patient                            tolerated the procedure well. Scope In: Scope Out: Findings:      The Z-line was regular and was found 36 cm from the incisors.      A small hiatal hernia was present.      Scattered minimal inflammation characterized by congestion (edema) and       erythema was found in the entire examined stomach. Biopsies were taken       with a cold forceps for histology.      The cardia and gastric fundus were normal on retroflexion.      The duodenal bulb, first portion of the duodenum and second portion of       the duodenum were normal. Impression:               - Z-line regular, 36 cm from the incisors.                           - Small hiatal hernia.                           - Gastritis. Biopsied.                           - Normal duodenal bulb, first portion of the                            duodenum and  second portion of the duodenum. Moderate Sedation:      Moderate (conscious) sedation was personally administered by an       anesthesia professional. The following parameters were monitored: oxygen       saturation, heart rate, blood pressure, and response to care. Recommendation:           - Return patient to hospital ward for ongoing care.                           - Soft diet.                           - Continue present medications.                           - Await pathology results.                           - Return to my office PRN. Procedure Code(s):        --- Professional ---                           [phone], Esophagogastroduodenoscopy, flexible,                             transoral; with biopsy, single or multiple Diagnosis Code(s):        --- Professional ---                           K44.9, Diaphragmatic hernia without obstruction or                            gangrene                           K29.70, Gastritis, unspecified, without bleeding                           K92.0, Hematemesis                           R11.2, Nausea with vomiting, unspecified CPT copyright 2022 American [employer] Association. All rights reserved. The codes documented in this report are preliminary and upon coder review may  be revised to meet current compliance requirements. Kathi Der, MD Kathi Der, MD 03/03/2023 8:36:20 AM Number of Addenda: 0

## 2023-03-04 ENCOUNTER — Encounter (HOSPITAL_COMMUNITY): Payer: Self-pay | Admitting: Gastroenterology

## 2023-03-04 LAB — LEGIONELLA PNEUMOPHILA SEROGP 1 UR AG: L. pneumophila Serogp 1 Ur Ag: NEGATIVE

## 2023-03-05 ENCOUNTER — Encounter: Payer: Self-pay | Admitting: Licensed Clinical Social Worker

## 2023-03-05 LAB — SURGICAL PATHOLOGY

## 2023-03-06 ENCOUNTER — Ambulatory Visit (INDEPENDENT_AMBULATORY_CARE_PROVIDER_SITE_OTHER): Payer: BC Managed Care – PPO | Admitting: Psychology

## 2023-03-06 DIAGNOSIS — F330 Major depressive disorder, recurrent, mild: Secondary | ICD-10-CM
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
## 2023-03-07 DIAGNOSIS — G43A0 Cyclical vomiting, not intractable: Secondary | ICD-10-CM | POA: Diagnosis not present

## 2023-03-07 DIAGNOSIS — E871 Hypo-osmolality and hyponatremia: Secondary | ICD-10-CM | POA: Diagnosis not present

## 2023-03-07 DIAGNOSIS — J189 Pneumonia, unspecified organism: Secondary | ICD-10-CM | POA: Diagnosis not present

## 2023-03-09 NOTE — Anesthesia Postprocedure Evaluation (Signed)
Anesthesia Post Note  Patient: Katie Chang  Procedure(s) Performed: ESOPHAGOGASTRODUODENOSCOPY (EGD) WITH PROPOFOL BIOPSY     Patient location during evaluation: PACU Anesthesia Type: MAC Level of consciousness: awake and alert Pain management: pain level controlled Vital Signs Assessment: post-procedure vital signs reviewed and stable Respiratory status: spontaneous breathing, nonlabored ventilation, respiratory function stable and patient connected to nasal cannula oxygen Cardiovascular status: stable and blood pressure returned to baseline Postop Assessment: no apparent nausea or vomiting Anesthetic complications: no   No notable events documented.  Last Vitals:  Vitals:   03/03/23 0856 03/03/23 0923  BP: (!) 151/68 (!) 166/81  Pulse: 70 74  Resp: 13 18  Temp:  36.6 C  SpO2: 100% 100%    Last Pain:  Vitals:   03/03/23 1000  TempSrc:   PainSc: 0-No pain                 Demetrus Pavao

## 2023-03-11 ENCOUNTER — Ambulatory Visit: Payer: Self-pay | Admitting: Licensed Clinical Social Worker

## 2023-03-11 NOTE — Patient Outreach (Signed)
  Care Coordination  Follow Up Visit Note   03/11/2023 Name: Katie Chang MRN: [id number] DOB: Feb 05, 1961  Katie Chang is a 62 y.o. year old female who sees [place], [place] for primary care. I spoke with  Carren Rang by phone today.  What matters to the patients health and wellness today?  Managing her mental health   Patient is making progress and has connected for ongoing therapy. No new needs identified.   Goals Addressed             This Visit's Progress    COMPLETED: Connect with therapy to reduce symptoms of anxiety       Activities and task to complete in order to accomplish goals.   Continue relaxed breathing 3 times daily Continue with compliance of taking medication prescribed by Doctor Continue with therapy at [facility]        SDOH assessments and interventions completed:  No   Care Coordination Interventions:  Yes, provided  Interventions Today    Flowsheet Row Most Recent Value  Chronic Disease   Chronic disease during today's visit Hypertension (HTN)  General Interventions   General Interventions Discussed/Reviewed General Interventions Reviewed  Pearletha Furl for any new needs]  Mental Health Interventions   Mental Health Discussed/Reviewed Mental Health Reviewed  [Reports great connection with therapist has completed initial visit]       Follow up plan: No further intervention required.   Encounter Outcome:  Pt. Visit Completed   Sammuel Hines, LCSW Social Work Care Coordination  [facility] [employer] [phone]

## 2023-03-11 NOTE — Patient Instructions (Signed)
Visit Information  Thank you for taking time to visit with me today. Please don't hesitate to contact me if I can be of assistance to you.   Following are the goals we discussed today:   Goals Addressed             This Visit's Progress    COMPLETED: Connect with therapy to reduce symptoms of anxiety       Activities and task to complete in order to accomplish goals.   Continue relaxed breathing 3 times daily Continue with compliance of taking medication prescribed by Doctor Continue with therapy at [facility]          Care Coordination provides support specific to your health needs that extend beyond exceptional routine office care you already receive from your primary care doctor.    If you are eligible for standard Care Coordination, there is no cost to you.  The Care Coordination team is made up of the following team members: Registered Nurse Care Guide: disease management, health education, care coordination and complex case management Clinical Social Work: Complex Care Coordination including coordination of level of care needs, mental and behavioral health assessment and recommendations, and connection to long-term mental health support Clinical Pharmacist: medication management, assistance and disease management Community Resource Care Guides: Armed forces operational officer Team: dedicated team of scheduling professionals to support patient and clinical team scheduling needs  Please call [phone] if you would like to schedule a phone appointment with one of the team members.   If you are experiencing a Mental Health or Behavioral Health Crisis or need someone to talk to, please call the Suicide and Crisis Lifeline: 988 call the [place] National Suicide Prevention Lifeline: 306 835 0764 or TTY: (270) 258-9621 TTY 774-221-6912) to talk to a trained counselor call 1-800-273-TALK (toll free, 24 hour hotline) go to [facility]  Urgent Care [address] [place] [phone])   Patient verbalizes understanding of instructions and care plan provided today and agrees to view in MyChart. Active MyChart status and patient understanding of how to access instructions and care plan via MyChart confirmed with patient.     No further follow up required: by Social work at this time  Sammuel Hines, [employer] Social Work Care Coordination  [employer] [facility] [employer] [employer] [phone]

## 2023-03-12 ENCOUNTER — Ambulatory Visit (INDEPENDENT_AMBULATORY_CARE_PROVIDER_SITE_OTHER): Payer: BC Managed Care – PPO | Admitting: Psychology

## 2023-03-12 DIAGNOSIS — F330 Major depressive disorder, recurrent, mild: Secondary | ICD-10-CM

## 2023-03-12 NOTE — Progress Notes (Signed)
[facility] Counselor/Therapist Progress Note  Patient ID: Katie Chang, MRN: [id number],    Date: 03/12/2023  Time Spent: 45 mins  Treatment Type: Individual Therapy  Reported Symptoms: Pt presents for session via Caregility video.  Pt grants consent for session, stating she is in her home with no one else present.  I shared with pt that I am in my office with no one else present here.  Mental Status Exam: Appearance:  Casual     Behavior: Appropriate  Motor: Normal  Speech/Language:  Clear and Coherent  Affect: Appropriate  Mood: normal  Thought process: normal  Thought content:   WNL  Sensory/Perceptual disturbances:   WNL  Orientation: oriented to person, place, and time/date  Attention: Good  Concentration: Good  Memory: WNL  Fund of knowledge:  Good  Insight:   Good  Judgment:  Good  Impulse Control: Good   Risk Assessment: Danger to Self:  No Self-injurious Behavior: No Danger to Others: No Duty to Warn:no Physical Aggression / Violence:No  Access to Firearms a concern: No  Gang Involvement:No   Subjective: Pt shares that she "had it out this morning with everything she has against me.  She is 10 yrs younger than me and I helped raise her with my mom.We have always been in competition; sometimes her boyfriends would pursue me because I was 10 yrs older than them.  I just apologized to her for anything I have ever done that upset her."  Pt shares that she continues to deal with her pneumonia but she is getting better.  Her younger sister was addicted to percocet in the past and her brother is a recovering alcoholic.  Pt shares that she grew up in a lot of anxiety as a child.  Pt gets tearful when talking about her last relationship that ended in 2016.  Also talked about pt's paternal grandparents, especially her grandmother.  She went to [employer] and played the piano; "I was named for her Eulah Citizen).  She was not a good parent; they should not have had  children.  My dad had one older brother who became a doctor; my dad was an Airline pilot and they both went to [employer]."  Her dad is 50 yo and he has heart trouble that is progressive; he is mad because he can't do the things he used to do; he has wanted to be with mom since she passed away on 02/17/18.  Pt shares that she cares too much about what the people who love her think; her younger brother "is the altogether perfect child and I think he looks down on me for smoking so much pot.  He has never said that to me but I just think that is true."  Pt shares that she is working on reducing her pot use; she shares that she sure that it is making her sick.  After she gets the pot under control she wants to stop smoking cigarettes too.  She still does not have much of an appetite, since she turned 62 yo.  She does drink Ensure drinks daily for the protein.  Her sister is educated as a Health and safety inspector and gives her advice in that area.  She is walking 15 mins twice per day for the past 2 days and wants to get back to walking regularly.  She wants to get PT again because of her history of back issues; it has been beneficial for her in the past.  Encouraged pt to continue with her  walking and to think about what other self care activities she wants to re-engage with and we will meet next week for a follow up session.  Interventions: Cognitive Behavioral Therapy  Diagnosis:Major depressive disorder, recurrent episode, mild  Plan: Treatment Plan Strengths/Abilities:  Intelligent, Intuitive, Willing to participate in therapy Treatment Preferences:  Outpatient Individual Therapy Statement of Needs:  Patient is to use CBT, mindfulness and coping skills to help manage and/or decrease symptoms associated with their diagnosis. Symptoms:  Depressed/Irritable mood, worry, social withdrawal Problems Addressed:  Depressive thoughts, Sadness, Sleep issues, etc. Long Term Goals:  Pt to reduce overall level, frequency, and intensity  of the feelings of depression as evidenced by decreased irritability, negative self talk, and helpless feelings from 6 to 7 days/week to 0 to 1 days/week, per client report, for at least 3 consecutive months.  Progress: 30% Short Term Goals:  Pt to verbally express understanding of the relationship between feelings of depression and their impact on thinking patterns and behaviors.  Pt to verbalize an understanding of the role that distorted thinking plays in creating fears, excessive worry, and ruminations.  Progress: 30% Target Date:  03/11/2024 Frequency:  Bi-weekly Modality:  Cognitive Behavioral Therapy Interventions by Therapist:  Therapist will use CBT, Mindfulness exercises, Coping skills and Referrals, as needed by client. Client has verbally approved this treatment plan.  Karie Kirks, [facility]

## 2023-03-18 ENCOUNTER — Ambulatory Visit (INDEPENDENT_AMBULATORY_CARE_PROVIDER_SITE_OTHER): Payer: BC Managed Care – PPO | Admitting: Psychology

## 2023-03-18 DIAGNOSIS — F330 Major depressive disorder, recurrent, mild: Secondary | ICD-10-CM

## 2023-03-18 NOTE — Progress Notes (Signed)
[facility] Counselor/Therapist Progress Note  Patient ID: Violette Morneault, MRN: [id number],    Date: 03/18/2023  Time Spent: 45 mins  Treatment Type: Individual Therapy  Reported Symptoms: Pt presents for session via Caregility video.  Pt grants consent for session, stating she is in her home with no one else present.  I shared with pt that I am in my office with no one else present here.  Mental Status Exam: Appearance:  Casual     Behavior: Appropriate  Motor: Normal  Speech/Language:  Clear and Coherent  Affect: Appropriate  Mood: normal  Thought process: normal  Thought content:   WNL  Sensory/Perceptual disturbances:   WNL  Orientation: oriented to person, place, and time/date  Attention: Good  Concentration: Good  Memory: WNL  Fund of knowledge:  Good  Insight:   Good  Judgment:  Good  Impulse Control: Good   Risk Assessment: Danger to Self:  No Self-injurious Behavior: No Danger to Others: No Duty to Warn:no Physical Aggression / Violence:No  Access to Firearms a concern: No  Gang Involvement:No   Subjective: Pt shares that she "is doing much better than I was last week; my mood is much better.  I am keeping track of my reduced pot use.  I am noticing much more energy and my sister is helping too keep me accountable.  The pot was making me vomit and I am so tired of that."  Pt shares she has not had any episodes of vomiting since she got out of the hospital since 4/15 and she is very happy about that.  Pt shares that she and her sister are getting along really well lately.  Talked with pt about her started using pot due to anxiety and continuing to use it to deal with the anxiety.  She shares she is not feeling very anxious at this time and she believes it is because she is making positive choices.  She is sleeping well enough right now, with the use of ativan.  She is seeing Zollie Scale, her PCP on Friday for a physical and blood work.  Pt is continuing to walk  daily and enjoys that.  She is starting her PT on Thursday.  Tomorrow she is getting her tooth pulled and is looking forward to feeling better there.  Pt shares that her dad is doing OK this week; they go to lunch everyday together.  Pt talked with her older brother on Saturday and talked to her younger brother yesterday.  They are both doing pretty well.  Pt shares that she is really close with her family; she has a good friend that she has had for the past 20 yrs; her friend is bipolar and has ups and downs.  Pt shares her appetite is still not good and she plans to talk with Zollie Scale about it on Friday.  She is smoking a pack or less of cigarettes per day.  She is also still drinking Coke Zeros daily.  Talked with pt about the benefits of self care activities; she wants to re-engage with reading.  Pt shares she did a lot of cleaning outside this past weekend; "I over-did it this past weekend."  Talked with pt about the need for balance in her life; being steady in her efforts both inside and outside.  She continues to drink Ensure drinks daily.  Encouraged pt to continue with her walking and her other self care activities and we will meet next week for a follow up session.  Interventions: Cognitive Behavioral Therapy  Diagnosis:Major depressive disorder, recurrent episode, mild (HCC)  Plan: Treatment Plan Strengths/Abilities:  Intelligent, Intuitive, Willing to participate in therapy Treatment Preferences:  Outpatient Individual Therapy Statement of Needs:  Patient is to use CBT, mindfulness and coping skills to help manage and/or decrease symptoms associated with their diagnosis. Symptoms:  Depressed/Irritable mood, worry, social withdrawal Problems Addressed:  Depressive thoughts, Sadness, Sleep issues, etc. Long Term Goals:  Pt to reduce overall level, frequency, and intensity of the feelings of depression as evidenced by decreased irritability, negative self talk, and helpless feelings from 6 to 7  days/week to 0 to 1 days/week, per client report, for at least 3 consecutive months.  Progress: 30% Short Term Goals:  Pt to verbally express understanding of the relationship between feelings of depression and their impact on thinking patterns and behaviors.  Pt to verbalize an understanding of the role that distorted thinking plays in creating fears, excessive worry, and ruminations.  Progress: 30% Target Date:  03/11/2024 Frequency:  Bi-weekly Modality:  Cognitive Behavioral Therapy Interventions by Therapist:  Therapist will use CBT, Mindfulness exercises, Coping skills and Referrals, as needed by client. Client has verbally approved this treatment plan.  Karie Kirks, [facility]

## 2023-03-27 ENCOUNTER — Ambulatory Visit: Payer: BC Managed Care – PPO | Admitting: Psychology

## 2023-07-24 ENCOUNTER — Emergency Department (HOSPITAL_BASED_OUTPATIENT_CLINIC_OR_DEPARTMENT_OTHER): Payer: BC Managed Care – PPO

## 2023-07-24 ENCOUNTER — Encounter (HOSPITAL_BASED_OUTPATIENT_CLINIC_OR_DEPARTMENT_OTHER): Payer: Self-pay | Admitting: Emergency Medicine

## 2023-07-24 ENCOUNTER — Emergency Department (HOSPITAL_BASED_OUTPATIENT_CLINIC_OR_DEPARTMENT_OTHER): Payer: BC Managed Care – PPO | Admitting: Radiology

## 2023-07-24 ENCOUNTER — Observation Stay (HOSPITAL_BASED_OUTPATIENT_CLINIC_OR_DEPARTMENT_OTHER)
Admission: EM | Admit: 2023-07-24 | Discharge: 2023-07-26 | Disposition: A | Discharge status: Home / Self Care | Payer: BC Managed Care – PPO | Attending: Internal Medicine | Admitting: Internal Medicine

## 2023-07-24 ENCOUNTER — Other Ambulatory Visit: Payer: Self-pay

## 2023-07-24 DIAGNOSIS — F1290 Cannabis use, unspecified, uncomplicated: Secondary | ICD-10-CM

## 2023-07-24 DIAGNOSIS — F17210 Nicotine dependence, cigarettes, uncomplicated: Secondary | ICD-10-CM | POA: Diagnosis not present

## 2023-07-24 DIAGNOSIS — F419 Anxiety disorder, unspecified: Secondary | ICD-10-CM | POA: Diagnosis present

## 2023-07-24 DIAGNOSIS — R112 Nausea with vomiting, unspecified: Principal | ICD-10-CM

## 2023-07-24 DIAGNOSIS — Z79899 Other long term (current) drug therapy: Secondary | ICD-10-CM

## 2023-07-24 DIAGNOSIS — G8929 Other chronic pain: Secondary | ICD-10-CM | POA: Diagnosis not present

## 2023-07-24 DIAGNOSIS — I160 Hypertensive urgency: Secondary | ICD-10-CM | POA: Diagnosis not present

## 2023-07-24 DIAGNOSIS — G43909 Migraine, unspecified, not intractable, without status migrainosus: Secondary | ICD-10-CM

## 2023-07-24 DIAGNOSIS — R1084 Generalized abdominal pain: Secondary | ICD-10-CM | POA: Diagnosis not present

## 2023-07-24 DIAGNOSIS — Z791 Long term (current) use of non-steroidal anti-inflammatories (NSAID): Secondary | ICD-10-CM

## 2023-07-24 DIAGNOSIS — E785 Hyperlipidemia, unspecified: Secondary | ICD-10-CM | POA: Diagnosis not present

## 2023-07-24 DIAGNOSIS — R1115 Cyclical vomiting syndrome unrelated to migraine: Secondary | ICD-10-CM

## 2023-07-24 DIAGNOSIS — R109 Unspecified abdominal pain: Secondary | ICD-10-CM | POA: Diagnosis not present

## 2023-07-24 DIAGNOSIS — I10 Essential (primary) hypertension: Secondary | ICD-10-CM

## 2023-07-24 DIAGNOSIS — R1013 Epigastric pain: Secondary | ICD-10-CM | POA: Diagnosis not present

## 2023-07-24 DIAGNOSIS — J45909 Unspecified asthma, uncomplicated: Secondary | ICD-10-CM | POA: Diagnosis present

## 2023-07-24 DIAGNOSIS — Z823 Family history of stroke: Secondary | ICD-10-CM

## 2023-07-24 DIAGNOSIS — Z888 Allergy status to other drugs, medicaments and biological substances status: Secondary | ICD-10-CM

## 2023-07-24 DIAGNOSIS — F32A Depression, unspecified: Secondary | ICD-10-CM | POA: Diagnosis not present

## 2023-07-24 DIAGNOSIS — E222 Syndrome of inappropriate secretion of antidiuretic hormone: Secondary | ICD-10-CM | POA: Diagnosis not present

## 2023-07-24 DIAGNOSIS — K2970 Gastritis, unspecified, without bleeding: Secondary | ICD-10-CM | POA: Diagnosis not present

## 2023-07-24 DIAGNOSIS — Z8249 Family history of ischemic heart disease and other diseases of the circulatory system: Secondary | ICD-10-CM | POA: Diagnosis not present

## 2023-07-24 DIAGNOSIS — Z803 Family history of malignant neoplasm of breast: Secondary | ICD-10-CM | POA: Diagnosis not present

## 2023-07-24 DIAGNOSIS — M549 Dorsalgia, unspecified: Secondary | ICD-10-CM | POA: Diagnosis present

## 2023-07-24 DIAGNOSIS — E876 Hypokalemia: Secondary | ICD-10-CM | POA: Diagnosis present

## 2023-07-24 DIAGNOSIS — Z881 Allergy status to other antibiotic agents status: Secondary | ICD-10-CM

## 2023-07-24 DIAGNOSIS — Z833 Family history of diabetes mellitus: Secondary | ICD-10-CM

## 2023-07-24 DIAGNOSIS — K219 Gastro-esophageal reflux disease without esophagitis: Secondary | ICD-10-CM | POA: Diagnosis present

## 2023-07-24 DIAGNOSIS — D259 Leiomyoma of uterus, unspecified: Secondary | ICD-10-CM | POA: Diagnosis not present

## 2023-07-24 DIAGNOSIS — E861 Hypovolemia: Secondary | ICD-10-CM | POA: Diagnosis not present

## 2023-07-24 DIAGNOSIS — K449 Diaphragmatic hernia without obstruction or gangrene: Secondary | ICD-10-CM | POA: Diagnosis not present

## 2023-07-24 LAB — TROPONIN I (HIGH SENSITIVITY)
Troponin I (High Sensitivity): 4 ng/L (ref <18)
Troponin I (High Sensitivity): 7 ng/L (ref <18)

## 2023-07-24 LAB — CBC
HCT: 40.1 % (ref 36.0–46.0)
Hemoglobin: 13.2 g/dL (ref 12.0–15.0)
MCH: 25.1 pg — ABNORMAL LOW (ref 26.0–34.0)
MCHC: 32.9 g/dL (ref 30.0–36.0)
MCV: 76.4 fL — ABNORMAL LOW (ref 80.0–100.0)
Platelets: 445 10*3/uL — ABNORMAL HIGH (ref 150–400)
RBC: 5.25 MIL/uL — ABNORMAL HIGH (ref 3.87–5.11)
RDW: 17.9 % — ABNORMAL HIGH (ref 11.5–15.5)
WBC: 12.7 10*3/uL — ABNORMAL HIGH (ref 4.0–10.5)
nRBC: 0 % (ref 0.0–0.2)

## 2023-07-24 LAB — COMPREHENSIVE METABOLIC PANEL
ALT: 14 U/L (ref 0–44)
AST: 19 U/L (ref 15–41)
Albumin: 4.9 g/dL (ref 3.5–5.0)
Alkaline Phosphatase: 79 U/L (ref 38–126)
Anion gap: 15 (ref 5–15)
BUN: 8 mg/dL (ref 8–23)
CO2: 21 mmol/L — ABNORMAL LOW (ref 22–32)
Calcium: 10.1 mg/dL (ref 8.9–10.3)
Chloride: 99 mmol/L (ref 98–111)
Creatinine, Ser: 0.62 mg/dL (ref 0.44–1.00)
GFR, Estimated: >60 mL/min (ref >60)
Glucose, Bld: 124 mg/dL — ABNORMAL HIGH (ref 70–99)
Potassium: 3.9 mmol/L (ref 3.5–5.1)
Sodium: 135 mmol/L (ref 135–145)
Total Bilirubin: 0.6 mg/dL (ref 0.3–1.2)
Total Protein: 8 g/dL (ref 6.5–8.1)

## 2023-07-24 LAB — LIPASE, BLOOD: Lipase: 17 U/L (ref 11–51)

## 2023-07-24 MED ORDER — HYDRALAZINE HCL 20 MG/ML IJ SOLN
10.0000 mg | Freq: Once | INTRAMUSCULAR | Status: AC
  Administered 2023-07-24: 10 mg via INTRAVENOUS
  Filled 2023-07-24: qty 1

## 2023-07-24 MED ORDER — DROPERIDOL 2.5 MG/ML IJ SOLN
5.0000 mg | Freq: Once | INTRAMUSCULAR | Status: AC
  Administered 2023-07-24: 5 mg via INTRAVENOUS
  Filled 2023-07-24: qty 2

## 2023-07-24 MED ORDER — ROSUVASTATIN CALCIUM 10 MG PO TABS
10.0000 mg | ORAL_TABLET | ORAL | Status: DC
  Administered 2023-07-25: 10 mg via ORAL
  Filled 2023-07-24: qty 1

## 2023-07-24 MED ORDER — ONDANSETRON HCL 4 MG/2ML IJ SOLN
4.0000 mg | Freq: Once | INTRAMUSCULAR | Status: AC
  Administered 2023-07-24: 4 mg via INTRAVENOUS
  Filled 2023-07-24: qty 2

## 2023-07-24 MED ORDER — KETOROLAC TROMETHAMINE 15 MG/ML IJ SOLN
15.0000 mg | Freq: Once | INTRAMUSCULAR | Status: AC
  Administered 2023-07-24: 15 mg via INTRAVENOUS
  Filled 2023-07-24: qty 1

## 2023-07-24 MED ORDER — ONDANSETRON HCL 4 MG/2ML IJ SOLN
4.0000 mg | Freq: Four times a day (QID) | INTRAMUSCULAR | Status: DC | PRN
  Administered 2023-07-25 – 2023-07-26 (×3): 4 mg via INTRAVENOUS
  Filled 2023-07-24 (×3): qty 2

## 2023-07-24 MED ORDER — PANTOPRAZOLE SODIUM 40 MG IV SOLR
40.0000 mg | Freq: Every day | INTRAVENOUS | Status: DC
  Administered 2023-07-24 – 2023-07-25 (×2): 40 mg via INTRAVENOUS
  Filled 2023-07-24 (×2): qty 10

## 2023-07-24 MED ORDER — CAPSAICIN 0.025 % EX CREA
TOPICAL_CREAM | Freq: Once | CUTANEOUS | Status: AC
  Filled 2023-07-24: qty 60

## 2023-07-24 MED ORDER — METOPROLOL SUCCINATE ER 50 MG PO TB24
25.0000 mg | ORAL_TABLET | Freq: Every day | ORAL | Status: DC
  Administered 2023-07-25 – 2023-07-26 (×2): 25 mg via ORAL
  Filled 2023-07-24 (×2): qty 1

## 2023-07-24 MED ORDER — SODIUM CHLORIDE 0.9 % IV SOLN
12.5000 mg | Freq: Four times a day (QID) | INTRAVENOUS | Status: DC | PRN
  Administered 2023-07-24 – 2023-07-26 (×4): 12.5 mg via INTRAVENOUS
  Filled 2023-07-24 (×4): qty 12.5

## 2023-07-24 MED ORDER — IRBESARTAN 150 MG PO TABS
75.0000 mg | ORAL_TABLET | Freq: Every day | ORAL | Status: DC
  Administered 2023-07-25 – 2023-07-26 (×2): 75 mg via ORAL
  Filled 2023-07-24 (×2): qty 1

## 2023-07-24 MED ORDER — SODIUM CHLORIDE 0.9 % IV BOLUS
1000.0000 mL | Freq: Once | INTRAVENOUS | Status: AC
  Administered 2023-07-24: 1000 mL via INTRAVENOUS

## 2023-07-24 MED ORDER — LORATADINE 10 MG PO TABS
10.0000 mg | ORAL_TABLET | Freq: Every day | ORAL | Status: DC
  Administered 2023-07-24 – 2023-07-25 (×2): 10 mg via ORAL
  Filled 2023-07-24 (×2): qty 1

## 2023-07-24 MED ORDER — HYDROMORPHONE HCL 1 MG/ML IJ SOLN
0.5000 mg | INTRAMUSCULAR | Status: DC | PRN
  Administered 2023-07-24 – 2023-07-26 (×8): 0.5 mg via INTRAVENOUS
  Filled 2023-07-24 (×8): qty 0.5

## 2023-07-24 MED ORDER — LORAZEPAM 1 MG PO TABS
1.0000 mg | ORAL_TABLET | Freq: Every day | ORAL | Status: DC
  Administered 2023-07-24 – 2023-07-25 (×2): 1 mg via ORAL
  Filled 2023-07-24 (×2): qty 1

## 2023-07-24 MED ORDER — IOHEXOL 300 MG/ML  SOLN
100.0000 mL | Freq: Once | INTRAMUSCULAR | Status: AC | PRN
  Administered 2023-07-24: 85 mL via INTRAVENOUS

## 2023-07-24 MED ORDER — VITAMIN D 25 MCG (1000 UNIT) PO TABS
1000.0000 [IU] | ORAL_TABLET | Freq: Every day | ORAL | Status: DC
  Administered 2023-07-25 – 2023-07-26 (×2): 1000 [IU] via ORAL
  Filled 2023-07-24 (×2): qty 1

## 2023-07-24 MED ORDER — METOCLOPRAMIDE HCL 5 MG/ML IJ SOLN
10.0000 mg | Freq: Once | INTRAMUSCULAR | Status: AC
  Administered 2023-07-24: 10 mg via INTRAVENOUS
  Filled 2023-07-24: qty 2

## 2023-07-24 MED ORDER — DIAZEPAM 5 MG/ML IJ SOLN
2.5000 mg | Freq: Once | INTRAMUSCULAR | Status: AC
  Administered 2023-07-24: 2.5 mg via INTRAVENOUS
  Filled 2023-07-24: qty 2

## 2023-07-24 MED ORDER — AMLODIPINE BESYLATE 5 MG PO TABS
5.0000 mg | ORAL_TABLET | Freq: Every day | ORAL | Status: DC
  Administered 2023-07-25 – 2023-07-26 (×2): 5 mg via ORAL
  Filled 2023-07-24 (×2): qty 1

## 2023-07-24 MED ORDER — ALBUTEROL SULFATE (2.5 MG/3ML) 0.083% IN NEBU: 3.0000 mL | INHALATION_SOLUTION | Freq: Four times a day (QID) | RESPIRATORY_TRACT | Status: DC | PRN

## 2023-07-24 MED ORDER — LEVOCETIRIZINE DIHYDROCHLORIDE 5 MG PO TABS: 5.0000 mg | ORAL_TABLET | Freq: Every evening | ORAL | Status: DC

## 2023-07-24 MED ORDER — VENLAFAXINE HCL ER 75 MG PO CP24
112.5000 mg | ORAL_CAPSULE | Freq: Every day | ORAL | Status: DC
  Administered 2023-07-25 – 2023-07-26 (×2): 112.5 mg via ORAL
  Filled 2023-07-24 (×3): qty 1

## 2023-07-24 MED ORDER — MORPHINE SULFATE (PF) 2 MG/ML IV SOLN
2.0000 mg | Freq: Once | INTRAVENOUS | Status: AC
  Administered 2023-07-24: 2 mg via INTRAVENOUS
  Filled 2023-07-24: qty 1

## 2023-07-24 MED ORDER — LACTATED RINGERS IV SOLN: INTRAVENOUS | Status: AC

## 2023-07-24 MED ORDER — ENOXAPARIN SODIUM 40 MG/0.4ML IJ SOSY
40.0000 mg | PREFILLED_SYRINGE | INTRAMUSCULAR | Status: DC
  Administered 2023-07-25: 40 mg via SUBCUTANEOUS
  Filled 2023-07-24 (×2): qty 0.4

## 2023-07-24 MED ORDER — AMLODIPINE BESYLATE 5 MG PO TABS: 5.0000 mg | ORAL_TABLET | Freq: Once | ORAL | Status: DC

## 2023-07-24 MED ORDER — MORPHINE SULFATE (PF) 2 MG/ML IV SOLN: 1.0000 mg | INTRAVENOUS | Status: DC | PRN

## 2023-07-24 MED ORDER — DIPHENHYDRAMINE HCL 50 MG/ML IJ SOLN
25.0000 mg | Freq: Once | INTRAMUSCULAR | Status: AC
  Administered 2023-07-24: 25 mg via INTRAVENOUS
  Filled 2023-07-24: qty 1

## 2023-07-24 MED ORDER — MONTELUKAST SODIUM 10 MG PO TABS
10.0000 mg | ORAL_TABLET | Freq: Every morning | ORAL | Status: DC
  Administered 2023-07-25 – 2023-07-26 (×2): 10 mg via ORAL
  Filled 2023-07-24 (×2): qty 1

## 2023-07-24 NOTE — Assessment & Plan Note (Signed)
Continue Effexor.

## 2023-07-24 NOTE — ED Notes (Signed)
Patient transported to CT 

## 2023-07-24 NOTE — ED Notes (Signed)
Pt requesting pain medication. PA notified with verbal order to apply more Zostrix cream to abdomen. Pt refused additional application of cream to abdomen.

## 2023-07-24 NOTE — Assessment & Plan Note (Signed)
-  Has been evaluated and admitted numerous times in the past for the same symptoms.  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  Patient reports cutting back in the past few months and has had improvement of symptoms. -She also reports epigastric pain and is on Celebrex chronically.  Last had upper endoscopy with GI in April revealing gastritis.  Suspect she likely has recurrence of her gastritis contributing to some of her symptoms. -Start IV Protonix daily -As needed antiemetics -Keep on continuous fluid -Full liquid diet and advance as tolerated

## 2023-07-24 NOTE — H&P (Signed)
History and Physical    Patient: Katie Chang ZOX:[id number] DOB: November 10, 1961 DOA: 07/24/2023 DOS: the patient was seen and examined on 07/24/2023 PCP: Delma Officer, PA  Patient coming from:  [facility]  Chief Complaint:  Chief Complaint  Patient presents with   Emesis   HPI: [name] is a 62 y.o. female with medical history significant of hypertension, hyperlipidemia, cyclic vomiting, marijuana use, anxiety, hyperlipidemia, GERD presents with intractable nausea and vomiting.  Patient has been admitted and evaluated numerous times over the year for the same. She was last admitted for the same in April. Had GI consult and upper endoscopy significant for gastritis but otherwise unclear etiology of her vomiting. Thought possibly secondary to marijuana use. Has not established with GI outpatient.  Reports cutting back on marijuana use and has done well up until today. Last use yesterday. Begin to have persistent nausea and non-bilious, non-bloody vomiting. Now with severe epigastric pain.  Continues to take Celebrex daily for her chronic back pain.  Takes Nexium.  Denies any alcohol use.  In the ED, afebrile but BP up to 190. Has received various antiemetics including IV Zofran, IV Reglan, IV Benadryl, IV Valium and droperidol without relief.   CT abdomen and pelvis without any acute findings.  Hospitalist then consulted for admission and transfer to [facility].  Review of Systems: As mentioned in the history of present illness. All other systems reviewed and are negative. Past Medical History:  Diagnosis Date   Allergy    Arthritis    Asthma    Cervical dysplasia    Cyclic vomiting syndrome    DDD (degenerative disc disease), cervical    Cervical and Lumbar   Depression    Eczema    GERD (gastroesophageal reflux disease)    Hypertension    Menopausal symptoms    Migraines    PONV (postoperative nausea and vomiting)    sometimes nausea   Recurrent sinus infections     Past Surgical History:  Procedure Laterality Date   BIOPSY  03/03/2023   Procedure: BIOPSY;  Surgeon: Kathi Der, MD;  Location: [facility];  Service: Gastroenterology;;   CERVICAL BIOPSY     Dr. Eda Paschal   COLONOSCOPY W/ POLYPECTOMY     COLPOSCOPY     ESOPHAGOGASTRODUODENOSCOPY (EGD) WITH PROPOFOL N/A 05/13/2016   Procedure: ESOPHAGOGASTRODUODENOSCOPY (EGD) WITH PROPOFOL;  Surgeon: Charolett Bumpers, MD;  Location: [facility];  Service: Endoscopy;  Laterality: N/A;   ESOPHAGOGASTRODUODENOSCOPY (EGD) WITH PROPOFOL N/A 03/03/2023   Procedure: ESOPHAGOGASTRODUODENOSCOPY (EGD) WITH PROPOFOL;  Surgeon: Kathi Der, MD;  Location: [facility];  Service: Gastroenterology;  Laterality: N/A;   KNEE SURGERY     NASAL SINUS SURGERY  1992   NECK SURGERY     TONSILLECTOMY  1990   Social History:  reports that she has been smoking cigarettes. She has a 15 pack-year smoking history. She has never used smokeless tobacco. She reports current drug use. Drug: Marijuana. She reports that she does not drink alcohol.  Allergies  Allergen Reactions   Avelox [Moxifloxacin Hcl In Nacl] Hives    No allergic reaction to Levaquin.   Haldol [Haloperidol Lactate] Other (See Comments)    "Night terrors"    Family History  Problem Relation Age of Onset   Heart disease Mother    Hypertension Father    Diabetes Paternal Grandfather    Heart disease Paternal Grandfather    Stroke Maternal Grandmother    Stroke Maternal Grandfather    Breast cancer Maternal Aunt  Age 74's    Prior to Admission medications   Medication Sig Start Date End Date Taking? Authorizing Provider  albuterol (PROAIR HFA) 108 (90 BASE) MCG/ACT inhaler Inhale 2 puffs into the lungs every 6 (six) hours as needed for wheezing. 03/11/13  Yes Dunn, Raymon Mutton, PA-C  amLODipine (NORVASC) 5 MG tablet Take 5 mg by mouth daily. 03/29/22  Yes [provider]  celecoxib (CELEBREX) 200 MG capsule Take 200 mg by mouth  daily. 05/23/22  Yes [provider]  cholecalciferol (VITAMIN D3) 25 MCG (1000 UT) tablet Take 1,000 Units by mouth daily.   Yes [provider]  esomeprazole (NEXIUM) 40 MG capsule Take 40 mg by mouth daily. 03/04/18  Yes [provider]  fluticasone (FLONASE) 50 MCG/ACT nasal spray Place 2 sprays into both nostrils daily as needed for allergies.   Yes [provider]  levocetirizine (XYZAL) 5 MG tablet Take 0.5 tablets (2.5 mg total) by mouth every evening. Patient taking differently: Take 5 mg by mouth every evening. 03/03/23  Yes Narda Bonds, MD  LORazepam (ATIVAN) 1 MG tablet Take 1 mg by mouth at bedtime. 04/09/18  Yes [provider]  magnesium hydroxide (MILK OF MAGNESIA) 400 MG/5ML suspension Take 30 mLs by mouth at bedtime.   Yes [provider]  metoprolol succinate (TOPROL-XL) 25 MG 24 hr tablet Take 25 mg by mouth daily. 04/12/22  Yes [provider]  montelukast (SINGULAIR) 10 MG tablet TAKE ONE TABLET AT BEDTIME. Patient taking differently: Take 10 mg by mouth every morning. 04/22/14  Yes Ethelda Chick, MD  ondansetron (ZOFRAN-ODT) 8 MG disintegrating tablet Take 1 tablet (8 mg total) by mouth every 8 (eight) hours as needed for nausea or vomiting. 02/28/23  Yes Cathren Laine, MD  rosuvastatin (CRESTOR) 10 MG tablet Take 10 mg by mouth See admin instructions. Take 10mg  by mouth Monday thru Friday. 03/29/22  Yes [provider]  valsartan (DIOVAN) 80 MG tablet Take 80 mg by mouth at bedtime. 03/07/22  Yes [provider]  venlafaxine (EFFEXOR) 37.5 MG tablet Take 112.5 mg by mouth daily. 07/31/22  Yes [provider]    Physical Exam: Vitals:   07/24/23 1845 07/24/23 1857 07/24/23 2026 07/24/23 2125  BP: (!) 195/99  (!) 172/97 (!) 166/79  Pulse: 91  (!) 117 (!) 103  Resp: 13  (!) 26 (!) 22  Temp:  98.6 F (37 C) 98.5 F (36.9 C) 97.8 F (36.6 C)  TempSrc:  Temporal Oral Oral  SpO2: 98%  100%  100%  Weight:      Height:       Constitutional: NAD, mildly ill-appearing elderly female laying in bed in discomfort and reports abdominal pain Eyes: lids and conjunctivae normal ENMT: Mucous membranes are moist.  Neck: normal, supple Respiratory: clear to auscultation bilaterally, no wheezing, no crackles. Normal respiratory effort. No accessory muscle use.  Cardiovascular: Regular rate and rhythm, no murmurs / rubs / gallops. No extremity edema.  Abdomen: Soft, nontender nondistended.  No rebound tenderness, guarding or rigidity.  Bowel sounds positive.   Musculoskeletal: no clubbing / cyanosis. No joint deformity upper and lower extremities. Good ROM, no contractures. Normal muscle tone.  Skin: no rashes, lesions, ulcers. No induration Neurologic: CN 2-12 grossly intact. Psychiatric: Normal judgment and insight. Alert and oriented x 3. Normal mood.   Data Reviewed:  See HPI  Assessment and Plan: * Intractable nausea and vomiting -Has been evaluated and admitted numerous times in the past for  the same symptoms.  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  Patient reports cutting back in the past few months and has had improvement of symptoms. -She also reports epigastric pain and is on Celebrex chronically.  Last had upper endoscopy with GI in April revealing gastritis.  Suspect she likely has recurrence of her gastritis contributing to some of her symptoms. -Start IV Protonix daily -As needed antiemetics -Keep on continuous fluid -Full liquid diet and advance as tolerated  Essential hypertension - Elevated secondary to pain and persistent nausea and vomiting -BP is improving after total of 20 mg IV hydralazine given in ED -Resume home antihypertensives including amlodipine, irbesartan, metoprolol -Continue pain control  Hyperlipidemia - Continue statin  Migraines - Continue Effexor      Advance Care Planning:   Code Status:  Full Code   Consults: none  Family Communication: none at bedside  Severity of Illness: The appropriate patient status for this patient is OBSERVATION. Observation status is judged to be reasonable and necessary in order to provide the required intensity of service to ensure the patient's safety. The patient's presenting symptoms, physical exam findings, and initial radiographic and laboratory data in the context of their medical condition is felt to place them at decreased risk for further clinical deterioration. Furthermore, it is anticipated that the patient will be medically stable for discharge from the hospital within 2 midnights of admission.   Author: Anselm Jungling, DO 07/24/2023 10:02 PM  For on call review [email or web address].

## 2023-07-24 NOTE — Assessment & Plan Note (Signed)
-   Elevated secondary to pain and persistent nausea and vomiting -BP is improving after total of 20 mg IV hydralazine given in ED -Resume home antihypertensives including amlodipine, irbesartan, metoprolol -Continue pain control

## 2023-07-24 NOTE — ED Provider Notes (Signed)
[place] EMERGENCY DEPARTMENT AT [facility] Provider Note   CSN: [id number] Arrival date & time: 07/24/23  1042     History  Chief Complaint  Patient presents with   Emesis    Katie Chang is a 62 y.o. female, history of cyclic vomiting syndrome, who presents to the ED secondary to nausea, vomiting started this a.m.  She states for the last couple hours, she has had intractable vomiting, has vomited more than 5 times.  She states that she has epigastric pain, after vomiting, but denies any blood in her vomitus.  Denies any problems with her bowels, has been having normal bowel movements.  She states it feels very similar to her past ER visits, and that Zofran typically helps.  Denies any shortness of breath, urinary symptoms, fevers or chills.  Smokes marijuana daily.  No alcohol use per patient.   Home Medications Prior to Admission medications   Medication Sig Start Date End Date Taking? Authorizing Provider  albuterol (PROAIR HFA) 108 (90 BASE) MCG/ACT inhaler Inhale 2 puffs into the lungs every 6 (six) hours as needed for wheezing. 03/11/13   Sondra Barges, PA-C  amLODipine (NORVASC) 5 MG tablet Take 5 mg by mouth daily. 03/29/22   [provider]  celecoxib (CELEBREX) 200 MG capsule Take 200 mg by mouth daily. 05/23/22   [provider]  cholecalciferol (VITAMIN D3) 25 MCG (1000 UT) tablet Take 2,000 Units by mouth daily.    [provider]  esomeprazole (NEXIUM) 40 MG capsule Take 40 mg by mouth daily. 03/04/18   [provider]  fluticasone (FLONASE) 50 MCG/ACT nasal spray Place 2 sprays into both nostrils daily as needed for allergies.    [provider]  levocetirizine (XYZAL) 5 MG tablet Take 0.5 tablets (2.5 mg total) by mouth every evening. 03/03/23   Narda Bonds, MD  LORazepam (ATIVAN) 1 MG tablet Take 1 mg by mouth at bedtime. 04/09/18   [provider]  magnesium hydroxide (MILK OF MAGNESIA) 400 MG/5ML suspension  Take 30 mLs by mouth at bedtime.    [provider]  metoCLOPramide (REGLAN) 10 MG tablet Take 1 tablet (10 mg total) by mouth every 6 (six) hours. 03/01/23   Long, Arlyss Repress, MD  metoprolol succinate (TOPROL-XL) 25 MG 24 hr tablet Take 25 mg by mouth daily. 04/12/22   [provider]  montelukast (SINGULAIR) 10 MG tablet TAKE ONE TABLET AT BEDTIME. Patient taking differently: Take 10 mg by mouth every morning. 04/22/14   Ethelda Chick, MD  ondansetron (ZOFRAN-ODT) 8 MG disintegrating tablet Take 1 tablet (8 mg total) by mouth every 8 (eight) hours as needed for nausea or vomiting. 02/28/23   Cathren Laine, MD  rosuvastatin (CRESTOR) 10 MG tablet Take 10 mg by mouth See admin instructions. Take 10mg  by mouth Monday thru Friday. 03/29/22   [provider]  valsartan (DIOVAN) 80 MG tablet Take 80 mg by mouth at bedtime. 03/07/22   [provider]  venlafaxine (EFFEXOR) 37.5 MG tablet Take 112.5 mg by mouth daily. 07/31/22   [provider]      Allergies    Avelox [moxifloxacin hcl in nacl] and Haldol [haloperidol lactate]    Review of Systems   Review of Systems  Gastrointestinal:  Positive for nausea and vomiting. Negative for constipation.    Physical Exam Updated Vital Signs BP (!) 195/99   Pulse 91   Temp (!) 97.1 F (36.2 C) (Temporal)   Resp 13  Ht 5\' 3"  (1.6 m)   Wt 68 kg   SpO2 98%   BMI 26.57 kg/m  Physical Exam Vitals and nursing note reviewed.  Constitutional:      General: She is not in acute distress.    Appearance: She is well-developed. She is diaphoretic.  HENT:     Head: Normocephalic and atraumatic.  Eyes:     Conjunctiva/sclera: Conjunctivae normal.  Cardiovascular:     Rate and Rhythm: Normal rate and regular rhythm.     Heart sounds: No murmur heard. Pulmonary:     Effort: Pulmonary effort is normal. No respiratory distress.     Breath sounds: Normal breath sounds.  Abdominal:     Palpations: Abdomen is soft.      Tenderness: There is abdominal tenderness in the epigastric area.  Musculoskeletal:        General: No swelling.     Cervical back: Neck supple.  Skin:    General: Skin is warm.     Capillary Refill: Capillary refill takes less than 2 seconds.  Neurological:     Mental Status: She is alert.  Psychiatric:        Mood and Affect: Mood normal.     ED Results / Procedures / Treatments   Labs (all labs ordered are listed, but only abnormal results are displayed) Labs Reviewed  COMPREHENSIVE METABOLIC PANEL - Abnormal; Notable for the following components:      Result Value   CO2 21 (*)    Glucose, Bld 124 (*)    All other components within normal limits  CBC - Abnormal; Notable for the following components:   WBC 12.7 (*)    RBC 5.25 (*)    MCV 76.4 (*)    MCH 25.1 (*)    RDW 17.9 (*)    Platelets 445 (*)    All other components within normal limits  LIPASE, BLOOD  TROPONIN I (HIGH SENSITIVITY)  TROPONIN I (HIGH SENSITIVITY)    EKG EKG Interpretation Date/Time:  Thursday July 24 2023 10:52:37 EDT Ventricular Rate:  76 PR Interval:  172 QRS Duration:  91 QT Interval:  400 QTC Calculation: 450 R Axis:   66  Text Interpretation: Sinus rhythm Abnormal R-wave progression, early transition Consider left ventricular hypertrophy Confirmed by Edwin Dada ([id number]) on 07/24/2023 3:40:07 PM  Radiology CT ABDOMEN PELVIS W CONTRAST  Result Date: 07/24/2023 CLINICAL DATA:  Acute abdominal pain.  Cyclical vomiting. EXAM: CT ABDOMEN AND PELVIS WITH CONTRAST TECHNIQUE: Multidetector CT imaging of the abdomen and pelvis was performed using the standard protocol following bolus administration of intravenous contrast. RADIATION DOSE REDUCTION: This exam was performed according to the departmental dose-optimization program which includes automated exposure control, adjustment of the mA and/or kV according to patient size and/or use of iterative reconstruction technique. CONTRAST:  85mL  OMNIPAQUE IOHEXOL 300 MG/ML  SOLN COMPARISON:  CT 02/28/2023. FINDINGS: Lower chest: Lung bases are clear. No pleural effusion. Kiyoko Mcguirt hiatal hernia. Hepatobiliary: Fatty liver infiltration. No space-occupying liver lesion. Gallbladder is nondilated. Patent portal vein. Pancreas: Unremarkable. No pancreatic ductal dilatation or surrounding inflammatory changes. Spleen: Normal in size without focal abnormality. Adrenals/Urinary Tract: Adrenal glands are preserved. No enhancing renal mass or collecting system dilatation. The ureters have normal course and caliber down to the bladder. Preserved contours of the urinary bladder. Stomach/Bowel: On this non oral contrast exam, the stomach has a normal caliber. There is some mild luminal fluid. The Gaelle Adriance bowel is nondilated. Large bowel has a normal course and  caliber with scattered stool. The appendix is not well seen in the right lower quadrant but no specific pericecal stranding or fluid. Vascular/Lymphatic: Aortic atherosclerosis. No enlarged abdominal or pelvic lymph nodes. Reproductive: Retroverted uterus with calcified fibroids. No separate adnexal mass. Other: No free air or free fluid. Musculoskeletal: Moderate degenerative changes of the spine. Fixation hardware at L4-5 with streak artifact. Appearance is similar to the previous examination. There are areas of stenosis as well including at L5-S1. IMPRESSION: No bowel obstruction, free air or free fluid. The appendix is poorly seen but no pericecal stranding or fluid. The cecum resides in the right hemipelvis. Fatty liver infiltration. Lindy Pennisi hiatal hernia. Uterine fibroids. Electronically Signed   By: Karen Kays M.D.   On: 07/24/2023 18:19   DG Chest 1 View  Result Date: 07/24/2023 CLINICAL DATA:  Epigastric pain EXAM: CHEST  1 VIEW COMPARISON:  X-ray 02/27/2023, 03/01/2023 and older FINDINGS: No consolidation, pneumothorax or effusion. No edema. Normal cardiopericardial silhouette. Fixation hardware along the  lower cervical spine. Degenerative changes of the spine and shoulders. IMPRESSION: No acute cardiopulmonary disease. Electronically Signed   By: Karen Kays M.D.   On: 07/24/2023 12:49   DG Abdomen 1 View  Result Date: 07/24/2023 CLINICAL DATA:  Nausea and vomiting EXAM: ABDOMEN - 1 VIEW COMPARISON:  X-ray 12/24/2022 FINDINGS: Gas seen in nondilated loops of Edlin Ford and large bowel. No obstruction. Overall minimal Louie Flenner bowel gas overall. Extensive degenerative changes along the spine with fixation hardware along the lower lumbar spine. Overlapping cardiac leads. No obvious free air. Portions of the left hemidiaphragm in the right lateral hemi abdominal edge are clipped off the edge of the film. IMPRESSION: Limited radiograph with a nonspecific bowel gas pattern. Electronically Signed   By: Karen Kays M.D.   On: 07/24/2023 12:49    Procedures Procedures    Medications Ordered in ED Medications  amLODipine (NORVASC) tablet 5 mg (0 mg Oral Hold 07/24/23 1317)  hydrALAZINE (APRESOLINE) injection 10 mg (has no administration in time range)  sodium chloride 0.9 % bolus 1,000 mL (0 mLs Intravenous Stopped 07/24/23 1242)  ketorolac (TORADOL) 15 MG/ML injection 15 mg (15 mg Intravenous Given 07/24/23 1115)  ondansetron (ZOFRAN) injection 4 mg (4 mg Intravenous Given 07/24/23 1115)  droperidol (INAPSINE) 2.5 MG/ML injection 5 mg (5 mg Intravenous Given 07/24/23 1139)  morphine (PF) 2 MG/ML injection 2 mg (2 mg Intravenous Given 07/24/23 1238)  hydrALAZINE (APRESOLINE) injection 10 mg (10 mg Intravenous Given 07/24/23 1309)  capsaicin (ZOSTRIX) 0.025 % cream ( Topical Given 07/24/23 1310)  metoCLOPramide (REGLAN) injection 10 mg (10 mg Intravenous Given 07/24/23 1450)  diphenhydrAMINE (BENADRYL) injection 25 mg (25 mg Intravenous Given 07/24/23 1450)  iohexol (OMNIPAQUE) 300 MG/ML solution 100 mL (85 mLs Intravenous Contrast Given 07/24/23 1619)  diazepam (VALIUM) injection 2.5 mg (2.5 mg Intravenous Given 07/24/23 1727)     ED Course/ Medical Decision Making/ A&P                                 Medical Decision Making Patient is 62 year old female, history of cyclic vomiting syndrome, who presents to the ED secondary nausea, vomiting starting this a.m.  She has intractable nausea, vomiting, cannot keep anything down.  Cannot even keep her blood pressure pressure meds down, so she did not take them today.  We will obtain blood work, for further evaluation To treat with some nausea meds.  Amount and/or Complexity of  Data Reviewed Labs: ordered.    Details: Leukocytosis of 12K, negative for transaminitis, lipase within normal limits, troponin negative Radiology: ordered.    Details: CT abdomen pelvis shows no acute findings Discussion of management or test interpretation with external provider(s): Discussed with patient, we have tried multiple antiemetics, pain medicines, and have not had any relief.  CT abdomen pelvis, was obtained to evaluate for any kind of acute abnormalities, and there was no findings.  Believe that she has cyclic vomiting syndrome, and her symptoms are not controlled.  Given that she is not able to tolerate p.o., and cannot stop vomiting, we will admit to hospitalist.  Additionally she will need some management of her blood pressure, as she cannot keep her blood pressure medicines down at this time.  During this time she repeatedly requested Dilaudid, which I informed her is not necessary for cyclic vomiting syndrome.  Admitted to Dr. Cyndia Bent  Risk OTC drugs. Prescription drug management. Decision regarding hospitalization.    Final Clinical Impression(s) / ED Diagnoses Final diagnoses:  Nausea and vomiting, unspecified vomiting type  Generalized abdominal pain  Hypertension, unspecified type    Rx / DC Orders ED Discharge Orders     None         Czarina Gingras Elbert Ewings, PA 07/24/23 1854    Franne Forts, DO 07/29/23 0013

## 2023-07-24 NOTE — Assessment & Plan Note (Signed)
Continue statin. 

## 2023-07-24 NOTE — ED Notes (Signed)
Pt refused to PO challenge. Pt stated pain was too severe and would not attempt to PO challenge at this time.

## 2023-07-24 NOTE — ED Triage Notes (Signed)
Pt arrives to ED with c/o vomiting this morning. Hx cyclical vomiting syndrome. Current THC user.

## 2023-07-24 NOTE — ED Notes (Signed)
Pt ambulatory to restroom

## 2023-07-24 NOTE — Progress Notes (Signed)
Plan of Care Note for accepted transfer   Patient: Katie Chang MRN: [id number]   DOA: 07/24/2023  Facility requesting transfer: [facility] ED Requesting Provider: Barnie Alderman PA Reason for transfer: intractable nausea and vomiting Facility course: 62 y.o. female with a history of hypertension, hyperlipidemia, cyclic vomiting, marijuana use, anxiety, hyperlipidemia, GERD presents with intractable nausea and vomiting and uncontrolled HTN .   CT abd/pelvis without acute findings. She was last admitted for the same in April. Had GI consult and upper endoscopy significant for gastritis but otherwise unclear etiology of her vomiting. Thought possibly secondary to marijuana use.   Pt has been receiving numerous antiemetics in the ED without relief.  BP up to 190s which EDP has been administering PRN IV hydralazine. Pt also very anxious and has been asking for Dilaudid.     Plan of care: The patient is accepted for admission to Progressive unit, at [facility] Or [facility] depending on bed availability. EDP to continue care of patient while she remains in ED.   Author: Anselm Jungling, DO 07/24/2023  Check [email or web address] for on-call coverage.  Nursing staff, Please call [facility] Admits & Consults System-Wide number on Amion as soon as patient's arrival, so appropriate admitting provider can evaluate the pt.

## 2023-07-25 DIAGNOSIS — I10 Essential (primary) hypertension: Secondary | ICD-10-CM | POA: Diagnosis present

## 2023-07-25 DIAGNOSIS — E785 Hyperlipidemia, unspecified: Secondary | ICD-10-CM | POA: Diagnosis present

## 2023-07-25 DIAGNOSIS — Z803 Family history of malignant neoplasm of breast: Secondary | ICD-10-CM | POA: Diagnosis not present

## 2023-07-25 DIAGNOSIS — F1290 Cannabis use, unspecified, uncomplicated: Secondary | ICD-10-CM | POA: Diagnosis present

## 2023-07-25 DIAGNOSIS — G43909 Migraine, unspecified, not intractable, without status migrainosus: Secondary | ICD-10-CM | POA: Diagnosis present

## 2023-07-25 DIAGNOSIS — J45909 Unspecified asthma, uncomplicated: Secondary | ICD-10-CM | POA: Diagnosis present

## 2023-07-25 DIAGNOSIS — E876 Hypokalemia: Secondary | ICD-10-CM | POA: Diagnosis present

## 2023-07-25 DIAGNOSIS — F32A Depression, unspecified: Secondary | ICD-10-CM | POA: Diagnosis present

## 2023-07-25 DIAGNOSIS — Z833 Family history of diabetes mellitus: Secondary | ICD-10-CM | POA: Diagnosis not present

## 2023-07-25 DIAGNOSIS — F17210 Nicotine dependence, cigarettes, uncomplicated: Secondary | ICD-10-CM | POA: Diagnosis present

## 2023-07-25 DIAGNOSIS — Z8249 Family history of ischemic heart disease and other diseases of the circulatory system: Secondary | ICD-10-CM | POA: Diagnosis not present

## 2023-07-25 DIAGNOSIS — Z791 Long term (current) use of non-steroidal anti-inflammatories (NSAID): Secondary | ICD-10-CM | POA: Diagnosis not present

## 2023-07-25 DIAGNOSIS — K2970 Gastritis, unspecified, without bleeding: Secondary | ICD-10-CM | POA: Diagnosis present

## 2023-07-25 DIAGNOSIS — K219 Gastro-esophageal reflux disease without esophagitis: Secondary | ICD-10-CM | POA: Diagnosis present

## 2023-07-25 DIAGNOSIS — I160 Hypertensive urgency: Secondary | ICD-10-CM | POA: Diagnosis present

## 2023-07-25 DIAGNOSIS — M549 Dorsalgia, unspecified: Secondary | ICD-10-CM | POA: Diagnosis present

## 2023-07-25 DIAGNOSIS — Z888 Allergy status to other drugs, medicaments and biological substances status: Secondary | ICD-10-CM | POA: Diagnosis not present

## 2023-07-25 DIAGNOSIS — Z823 Family history of stroke: Secondary | ICD-10-CM | POA: Diagnosis not present

## 2023-07-25 DIAGNOSIS — E222 Syndrome of inappropriate secretion of antidiuretic hormone: Secondary | ICD-10-CM | POA: Diagnosis present

## 2023-07-25 DIAGNOSIS — F419 Anxiety disorder, unspecified: Secondary | ICD-10-CM | POA: Diagnosis present

## 2023-07-25 DIAGNOSIS — R112 Nausea with vomiting, unspecified: Secondary | ICD-10-CM | POA: Diagnosis not present

## 2023-07-25 DIAGNOSIS — G8929 Other chronic pain: Secondary | ICD-10-CM | POA: Diagnosis present

## 2023-07-25 DIAGNOSIS — Z79899 Other long term (current) drug therapy: Secondary | ICD-10-CM | POA: Diagnosis not present

## 2023-07-25 DIAGNOSIS — Z881 Allergy status to other antibiotic agents status: Secondary | ICD-10-CM | POA: Diagnosis not present

## 2023-07-25 DIAGNOSIS — R1084 Generalized abdominal pain: Secondary | ICD-10-CM | POA: Diagnosis present

## 2023-07-25 DIAGNOSIS — E861 Hypovolemia: Secondary | ICD-10-CM | POA: Diagnosis present

## 2023-07-25 LAB — CBC
HCT: 36.8 % (ref 36.0–46.0)
Hemoglobin: 12.6 g/dL (ref 12.0–15.0)
MCH: 25.6 pg — ABNORMAL LOW (ref 26.0–34.0)
MCHC: 34.2 g/dL (ref 30.0–36.0)
MCV: 74.6 fL — ABNORMAL LOW (ref 80.0–100.0)
Platelets: 442 10*3/uL — ABNORMAL HIGH (ref 150–400)
RBC: 4.93 MIL/uL (ref 3.87–5.11)
RDW: 18 % — ABNORMAL HIGH (ref 11.5–15.5)
WBC: 12.9 10*3/uL — ABNORMAL HIGH (ref 4.0–10.5)
nRBC: 0 % (ref 0.0–0.2)

## 2023-07-25 LAB — BASIC METABOLIC PANEL
Anion gap: 14 (ref 5–15)
BUN: 10 mg/dL (ref 8–23)
CO2: 20 mmol/L — ABNORMAL LOW (ref 22–32)
Calcium: 9.2 mg/dL (ref 8.9–10.3)
Chloride: 94 mmol/L — ABNORMAL LOW (ref 98–111)
Creatinine, Ser: 0.58 mg/dL (ref 0.44–1.00)
GFR, Estimated: >60 mL/min (ref >60)
Glucose, Bld: 152 mg/dL — ABNORMAL HIGH (ref 70–99)
Potassium: 2.9 mmol/L — ABNORMAL LOW (ref 3.5–5.1)
Sodium: 128 mmol/L — ABNORMAL LOW (ref 135–145)

## 2023-07-25 LAB — MAGNESIUM: Magnesium: 2 mg/dL (ref 1.7–2.4)

## 2023-07-25 MED ORDER — LIDOCAINE 5 % EX PTCH
1.0000 | MEDICATED_PATCH | CUTANEOUS | Status: DC
  Administered 2023-07-25: 1 via TRANSDERMAL
  Filled 2023-07-25 (×2): qty 1

## 2023-07-25 MED ORDER — POTASSIUM CHLORIDE CRYS ER 20 MEQ PO TBCR
40.0000 meq | EXTENDED_RELEASE_TABLET | ORAL | Status: AC
  Administered 2023-07-25 (×2): 40 meq via ORAL
  Filled 2023-07-25 (×2): qty 2

## 2023-07-25 MED ORDER — LACTATED RINGERS IV SOLN: INTRAVENOUS | Status: AC

## 2023-07-25 NOTE — Plan of Care (Signed)
  Problem: Pain Managment: Goal: General experience of comfort will improve Outcome: Progressing   Problem: Safety: Goal: Ability to remain free from injury will improve Outcome: Progressing   Problem: Skin Integrity: Goal: Risk for impaired skin integrity will decrease Outcome: Progressing   

## 2023-07-25 NOTE — TOC CM/SW Note (Signed)
Transition of Care [facility]) - Inpatient Brief Assessment   Patient Details  Name: Katie Chang MRN: [id number] Date of Birth: 07/07/1961  Transition of Care [facility]) CM/SW Contact:    Howell Rucks, RN Phone Number: 07/25/2023, 11:55 AM   Clinical Narrative: Met with pt at bedside to introduce role of TOC/NCM and review for dc planning, reports she has a PCP and pharmacy in place, no current home care services or home DME, pt reports she feels safe returnign home with support from family/friends/neighbors, confirms she has transportation at discharge. TOC Brief Assessment completed. No TOC needs identified.    Transition of Care Asessment: Insurance and Status: Insurance coverage has been reviewed Patient has primary care physician: Yes Home environment has been reviewed: private residence with support from family/friends/neighbors Prior level of function:: Independent Prior/Current Home Services: No current home services Social Determinants of Health Reivew: SDOH reviewed no interventions necessary Readmission risk has been reviewed: Yes Transition of care needs: no transition of care needs at this time

## 2023-07-25 NOTE — Plan of Care (Signed)

## 2023-07-25 NOTE — Progress Notes (Signed)
PROGRESS NOTE  Katie Chang  DOB: 1961/06/19  PCP: Delma Officer, [place] FAO:[id number]  DOA: 07/24/2023  LOS: 0 days  Hospital Day: 2  Brief narrative: Katie Chang is a 62 y.o. female with PMH significant for HTN, HLD, marijuana use, cyclic vomiting, GERD, allergy 9/5, patient presented to ED with complaint of intractable nausea and vomiting Patient has been admitted and evaluated numerous times over the year for the same. She was last admitted for the same in April. Had GI consult and upper endoscopy significant for gastritis but otherwise unclear etiology of her vomiting. Thought possibly secondary to marijuana use. Has not established with GI outpatient.  Reports cutting back on marijuana use and has done well up until today.  Last use of marijuana was the day prior.  9/5, patient developed nausea, vomiting which persisted as non-bilious, non-bloody vomiting leading to severe epigastric pain and hence came to the ED.  In the ED, patient was afebrile, persistently nauseous, blood pressure was elevated and went as high as 202/104 She needed various IV antiemetics including IV Zofran, IV Reglan, IV Benadryl, IV Valium and droperidol without relief.   CT abdomen and pelvis did not show any evidence of bowel obstruction, free fluid or free air  Kept in observation to [facility]   Subjective: Patient was seen and examined this morning Pleasant middle-aged Caucasian female.  Lying down in bed.  Not in distress.  Feels better than at presentation. Chart reviewed Afebrile, remains tachycardic to 90s and low 100s, blood pressure in 160s Labs from this morning with potassium low at 2.9, sodium low at 128  Assessment and plan: Recurrent intractable nausea and vomiting Has been evaluated and admitted numerous times in the past for the same symptoms.  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  Patient reports cutting back in the past few months and has had  improvement of symptoms. Last had upper endoscopy with GI in April revealing gastritis.  Suspect she likely has recurrence of her gastritis contributing to some of her symptoms. Currently on IV Protonix, IV fluid, as needed IV antiemetics Full liquid diet currently.  Feels better and wants to advance to soft diet.  Hypokalemia Potassium low at 2.9 this morning.  Replacement ordered.  Obtain magnesium level as well Recent Labs  Lab 07/24/23 1059 07/25/23 0510  K 3.9 2.9*    Hyponatremia Sudden slide in sodium level noted today.  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  Could be SIADH due to recurrent nausea as well.  Continue to monitor Recent Labs  Lab 07/24/23 1059 07/25/23 0510  NA 135 128*     Hypertensive urgency Blood pressure was significantly elevated over 200 yesterday in the setting of severe nausea  Overnight blood pressure improving, 160s this morning  PTA meds- metoprolol, amlodipine, irbesartan  Continue all. IV hydralazine as needed   Hyperlipidemia Continue statin   Migraines Continue Effexor    Mobility: Encourage ambulation  Goals of care   Code Status: Full Code     DVT prophylaxis:  enoxaparin (LOVENOX) injection 40 mg Start: 07/25/23 1000   Antimicrobials: None Fluid: LR at 75 mL/h currently Consultants: None Family Communication: None at bedside  Status: Observation Level of care:  Progressive   Patient is from: Home Needs to continue in-hospital care: Improving symptoms.  Electrolytes significantly low Anticipated d/c to: Hopefully home in 1 to 2 days      Diet:  Diet Order  DIET SOFT Room service appropriate? Yes; Fluid consistency: Thin  Diet effective now                   Scheduled Meds:  amLODipine  5 mg Oral Daily   cholecalciferol  1,000 Units Oral Daily   enoxaparin (LOVENOX) injection  40 mg Subcutaneous Q24H   irbesartan  75 mg Oral Daily   lidocaine  1 patch Transdermal Q24H   loratadine   10 mg Oral q1800   LORazepam  1 mg Oral QHS   metoprolol succinate  25 mg Oral Daily   montelukast  10 mg Oral q morning   pantoprazole (PROTONIX) IV  40 mg Intravenous QHS   potassium chloride  40 mEq Oral Q2H   rosuvastatin  10 mg Oral Once per day on Monday Tuesday Wednesday Thursday Friday   venlafaxine XR  112.5 mg Oral Q breakfast    PRN meds: albuterol, HYDROmorphone (DILAUDID) injection, morphine injection, ondansetron (ZOFRAN) IV, promethazine (PHENERGAN) injection (IM or IVPB)   Infusions:   lactated ringers 75 mL/hr at 07/25/23 1121   promethazine (PHENERGAN) injection (IM or IVPB) 12.5 mg (07/24/23 2226)    Antimicrobials: Anti-infectives (From admission, onward)    None       Nutritional status:  Body mass index is 26.57 kg/m.          Objective: Vitals:   07/25/23 0900 07/25/23 1116  BP: (!) 152/79 (!) 158/82  Pulse: 97 96  Resp:  20  Temp:  98.2 F (36.8 C)  SpO2:  100%    Intake/Output Summary (Last 24 hours) at 07/25/2023 1125 Last data filed at 07/24/2023 1242 Gross per 24 hour  Intake 1000 ml  Output 2 ml  Net 998 ml   Filed Weights   07/24/23 1049  Weight: 68 kg   Weight change:  Body mass index is 26.57 kg/m.   Physical Exam: General exam: Pleasant, middle-aged Caucasian female. Skin: No rashes, lesions or ulcers. HEENT: Atraumatic, normocephalic, no obvious bleeding Lungs: Clear to auscultation bilaterally CVS: Regular rate and rhythm, no murmur GI/Abd soft, nontender, nondistended, bowel sound present CNS: Alert, awake monitor x 3 Psychiatry: Mood appropriate Extremities: No pedal edema, no calf tenderness  Data Review: I have personally reviewed the laboratory data and studies available.  F/u labs ordered Unresulted Labs (From admission, onward)     Start     Ordered   07/26/23 0500  CBC with Differential/Platelet  Tomorrow morning,   R        07/25/23 1101   07/26/23 0500  Basic metabolic panel  Tomorrow morning,   R         07/25/23 1101   07/26/23 0500  Phosphorus  Tomorrow morning,   R        07/25/23 1101   07/26/23 0500  Magnesium  Tomorrow morning,   R        07/25/23 1101   07/25/23 1101  Magnesium  Add-on,   AD        07/25/23 1100            Total time spent in review of labs and imaging, patient evaluation, formulation of plan, documentation and communication with family: 55 minutes  Signed, Lorin Glass, MD [facility] 07/25/2023

## 2023-07-25 NOTE — Progress Notes (Signed)
Mobility Specialist - Progress Note   07/25/23 0945  Mobility  Activity Ambulated independently in hallway  Level of Assistance Independent after set-up  Assistive Device None  Distance Ambulated (ft) 460 ft  Range of Motion/Exercises Active  Activity Response Tolerated well  Mobility Referral Yes  $Mobility charge 1 Mobility  Mobility Specialist Start Time (ACUTE ONLY) 0930  Mobility Specialist Stop Time (ACUTE ONLY) [id number]  Mobility Specialist Time Calculation (min) (ACUTE ONLY) 12 min   Pt was found in bed and agreeable to ambulate. No complaints with session. At EOS returned to bed with all needs met. Call bell in reach.  Billey Chang Mobility Specialist

## 2023-07-26 DIAGNOSIS — R112 Nausea with vomiting, unspecified: Secondary | ICD-10-CM | POA: Diagnosis not present

## 2023-07-26 LAB — CBC WITH DIFFERENTIAL/PLATELET
Abs Immature Granulocytes: 0.05 10*3/uL (ref 0.00–0.07)
Basophils Absolute: 0 10*3/uL (ref 0.0–0.1)
Basophils Relative: 0 %
Eosinophils Absolute: 0.1 10*3/uL (ref 0.0–0.5)
Eosinophils Relative: 1 %
HCT: 36.9 % (ref 36.0–46.0)
Hemoglobin: 12.1 g/dL (ref 12.0–15.0)
Immature Granulocytes: 0 %
Lymphocytes Relative: 28 %
Lymphs Abs: 3.7 10*3/uL (ref 0.7–4.0)
MCH: 25.5 pg — ABNORMAL LOW (ref 26.0–34.0)
MCHC: 32.8 g/dL (ref 30.0–36.0)
MCV: 77.7 fL — ABNORMAL LOW (ref 80.0–100.0)
Monocytes Absolute: 0.9 10*3/uL (ref 0.1–1.0)
Monocytes Relative: 7 %
Neutro Abs: 8.4 10*3/uL — ABNORMAL HIGH (ref 1.7–7.7)
Neutrophils Relative %: 64 %
Platelets: 446 10*3/uL — ABNORMAL HIGH (ref 150–400)
RBC: 4.75 MIL/uL (ref 3.87–5.11)
RDW: 18.4 % — ABNORMAL HIGH (ref 11.5–15.5)
WBC: 13.1 10*3/uL — ABNORMAL HIGH (ref 4.0–10.5)
nRBC: 0 % (ref 0.0–0.2)

## 2023-07-26 LAB — BASIC METABOLIC PANEL
Anion gap: 12 (ref 5–15)
BUN: 16 mg/dL (ref 8–23)
CO2: 20 mmol/L — ABNORMAL LOW (ref 22–32)
Calcium: 9.6 mg/dL (ref 8.9–10.3)
Chloride: 98 mmol/L (ref 98–111)
Creatinine, Ser: 0.37 mg/dL — ABNORMAL LOW (ref 0.44–1.00)
GFR, Estimated: >60 mL/min (ref >60)
Glucose, Bld: 136 mg/dL — ABNORMAL HIGH (ref 70–99)
Potassium: 3.2 mmol/L — ABNORMAL LOW (ref 3.5–5.1)
Sodium: 130 mmol/L — ABNORMAL LOW (ref 135–145)

## 2023-07-26 LAB — PHOSPHORUS: Phosphorus: 2.7 mg/dL (ref 2.5–4.6)

## 2023-07-26 LAB — MAGNESIUM: Magnesium: 2 mg/dL (ref 1.7–2.4)

## 2023-07-26 MED ORDER — HYDROMORPHONE HCL 1 MG/ML IJ SOLN: 1.0000 mg | Freq: Once | INTRAMUSCULAR | Status: DC

## 2023-07-26 MED ORDER — PANTOPRAZOLE SODIUM 40 MG IV SOLR: 40.0000 mg | Freq: Two times a day (BID) | INTRAVENOUS | Status: DC

## 2023-07-26 MED ORDER — OXYCODONE HCL 5 MG PO TABS: 5.0000 mg | ORAL_TABLET | Freq: Four times a day (QID) | ORAL | 0 refills | Status: AC | PRN

## 2023-07-26 MED ORDER — POTASSIUM CHLORIDE CRYS ER 20 MEQ PO TBCR
40.0000 meq | EXTENDED_RELEASE_TABLET | ORAL | Status: AC
  Administered 2023-07-26 (×2): 40 meq via ORAL
  Filled 2023-07-26 (×2): qty 2

## 2023-07-26 MED ORDER — HYDROXYZINE HCL 10 MG PO TABS
10.0000 mg | ORAL_TABLET | Freq: Once | ORAL | Status: AC
  Administered 2023-07-26: 10 mg via ORAL
  Filled 2023-07-26: qty 1

## 2023-07-26 MED ORDER — ONDANSETRON 4 MG PO TBDP: 4.0000 mg | ORAL_TABLET | Freq: Three times a day (TID) | ORAL | 0 refills | Status: AC | PRN

## 2023-07-26 NOTE — Progress Notes (Signed)
0601 Pt C/O pain in mid abdominal area. C/O nausea, no vomiting. Rate 9/10 zofran 4 mgIV given 0510. Dilaudid 0.5 mg IV Pt stated "I drank this whole container of water without getting sick. Can I get a ginger ale" Pt yelling out in pain and as soon as I push IV Dilaudid pt stopped. No signs of pain immediately. Will continue to mointor

## 2023-07-26 NOTE — Final Progress Note (Signed)
Patient discharge instructions explained to patient. Encouraged patient to seek post hospital follow-up treatment for help with management of cyclic vomiting syndrome. Pharmacy verified with patient. PIV removed. Patient safely transported to main entrance for discharge.

## 2023-07-26 NOTE — Discharge Summary (Signed)
Physician Discharge Summary  Katie Chang VHQ:[id number] DOB: 01/20/1961 DOA: 07/24/2023  PCP: Delma Officer, [place]  Admit date: 07/24/2023 Discharge date: 07/26/2023  Admitted From: Home Discharge disposition: Home  Recommendations at discharge:  Cautious use of Zofran and oxycodone Stop using marijuana Stay on soft diet for 2 to 3 days and gradually advance.   Brief narrative: Katie Chang is a 62 y.o. female with PMH significant for HTN, HLD, marijuana use, cyclic vomiting, GERD, allergy 9/5, patient presented to ED with complaint of intractable nausea and vomiting Patient has been admitted and evaluated numerous times over the year for the same. She was last admitted for the same in April. Had GI consult and upper endoscopy significant for gastritis but otherwise unclear etiology of her vomiting. Thought possibly secondary to marijuana use. Has not established with GI outpatient.  Reports cutting back on marijuana use and has done well up until today.  Last use of marijuana was the day prior.  9/5, patient developed nausea, vomiting which persisted as non-bilious, non-bloody vomiting leading to severe epigastric pain and hence came to the ED.  In the ED, patient was afebrile, persistently nauseous, blood pressure was elevated and went as high as 202/104 She needed various IV antiemetics including IV Zofran, IV Reglan, IV Benadryl, IV Valium and droperidol without relief.   CT abdomen and pelvis did not show any evidence of bowel obstruction, free fluid or free air  Kept in observation to [facility]   Subjective: Patient was seen and examined this morning Feels better. Tolerating soft diet. Slightly nauseous this morning but able to hydrate herself.  Able to tolerate breakfast.  Feels ready to go.   Labs this morning show some electrolyte deficiencies but improving.  Hospital course: Recurrent intractable nausea and vomiting Has been evaluated and admitted numerous times in the past for  the same symptoms.  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  Patient reports cutting back in the past few months and has had improvement of symptoms. Last had upper endoscopy with GI in April revealing gastritis.  Suspect she likely has recurrence of her gastritis contributing to some of her symptoms. She was treated with IV Protonix, IV fluid, as needed IV antiemetics Symptoms gradually improved.  Able to tolerate soft diet this morning.  Still nauseous but no vomiting.  Adequately hydrated.  Feels ready to go home today.  Will discharge on a short course of Zofran and oxycodone. Stop marijuana Stay on soft diet for 2 to 3 days and gradually advance.  Hypokalemia Potassium was low at 2.9 yesterday.  Replacement given.  Low at 3.2 this morning.  Further replacement given.  Since vomiting has stopped, I expect her potassium level to spontaneously improve.   Recent Labs  Lab 07/24/23 1059 07/25/23 0510 07/26/23 0501  K 3.9 2.9* 3.2*  MG  --  2.0 2.0  PHOS  --   --  2.7    Hyponatremia Sudden slide in sodium level noted today.  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  Could be SIADH due to recurrent nausea as well.  Improving continue to monitor as an outpatient Recent Labs  Lab 07/24/23 1059 07/25/23 0510 07/26/23 0501  NA 135 128* 130*     Hypertensive urgency Blood pressure was significantly elevated over 200 on admission in the setting of severe nausea  PTA meds- metoprolol, amlodipine, irbesartan  Her home meds were resumed and blood pressure gradually improved.   Hyperlipidemia Continue statin  Migraines Continue Effexor    Mobility: Encourage ambulation.  Independent.  Lives by self at home  Goals of care   Code Status: Full Code   Wounds:  -    Discharge Exam:   Vitals:   07/25/23 1116 07/25/23 1551 07/25/23 2051 07/26/23 0600  BP: (!) 158/82 (!) 141/74 139/86 (!) 182/100  Pulse: 96 (!) 109 93 95  Resp: 20  20 20 18   Temp: 98.2 F (36.8 C) 98 F (36.7 C) 97.8 F (36.6 C) 98.2 F (36.8 C)  TempSrc: Oral Oral Oral Oral  SpO2: 100% 100% 100%   Weight:      Height:        Body mass index is 26.57 kg/m.   General exam: Pleasant, middle-aged Caucasian female. Skin: No rashes, lesions or ulcers. HEENT: Atraumatic, normocephalic, no obvious bleeding Lungs: Clear to auscultation bilaterally CVS: Regular rate and rhythm, no murmur GI/Abd soft, nontender, nondistended, bowel sound present CNS: Alert, awake monitor x 3 Psychiatry: Mood appropriate Extremities: No pedal edema, no calf tenderness  Follow ups:    Follow-up Information     Delma Officer, [place] Follow up.   Specialty: Internal Medicine Contact information: [address] [place] [address] [phone]                 Discharge Instructions:   Discharge Instructions     Call MD for:  difficulty breathing, headache or visual disturbances   Complete by: As directed    Call MD for:  extreme fatigue   Complete by: As directed    Call MD for:  hives   Complete by: As directed    Call MD for:  persistant dizziness or light-headedness   Complete by: As directed    Call MD for:  persistant nausea and vomiting   Complete by: As directed    Call MD for:  severe uncontrolled pain   Complete by: As directed    Call MD for:  temperature >100.4   Complete by: As directed    Diet general   Complete by: As directed    Discharge instructions   Complete by: As directed    Recommendations at discharge:   Cautious use of Zofran and oxycodone  Stop using marijuana  Stay on soft diet for 2 to 3 days and gradually advance.  General discharge instructions: Follow with Primary MD Delma Officer, [place] in 7 days  Please request your PCP  to go over your hospital tests, procedures, radiology results at the follow up. Please get your medicines reviewed and adjusted.  Your PCP may decide to repeat  certain labs or tests as needed. Do not drive, operate heavy machinery, perform activities at heights, swimming or participation in water activities or provide baby sitting services if your were admitted for syncope or siezures until you have seen by Primary MD or a Neurologist and advised to do so again. [place] Controlled Substance Reporting System database was reviewed. Do not drive, operate heavy machinery, perform activities at heights, swim, participate in water activities or provide baby-sitting services while on medications for pain, sleep and mood until your outpatient physician has reevaluated you and advised to do so again.  You are strongly recommended to comply with the dose, frequency and duration of prescribed medications. Activity: As tolerated with Full fall precautions use walker/cane & assistance as needed Avoid using any recreational substances like cigarette, tobacco, alcohol, or non-prescribed drug. If you experience worsening of your admission  symptoms, develop shortness of breath, life threatening emergency, suicidal or homicidal thoughts you must seek medical attention immediately by calling 911 or calling your MD immediately  if symptoms less severe. You must read complete instructions/literature along with all the possible adverse reactions/side effects for all the medicines you take and that have been prescribed to you. Take any new medicine only after you have completely understood and accepted all the possible adverse reactions/side effects.  Wear Seat belts while driving. You were cared for by a hospitalist during your hospital stay. If you have any questions about your discharge medications or the care you received while you were in the hospital after you are discharged, you can call the unit and ask to speak with the hospitalist or the covering physician. Once you are discharged, your primary care physician will handle any further medical issues. Please note that NO  REFILLS for any discharge medications will be authorized once you are discharged, as it is imperative that you return to your primary care physician (or establish a relationship with a primary care physician if you do not have one).   Increase activity slowly   Complete by: As directed        Discharge Medications:   Allergies as of 07/26/2023       Reactions   Avelox [moxifloxacin Hcl In Nacl] Hives   No allergic reaction to Levaquin.   Haldol [haloperidol Lactate] Other (See Comments)   "Night terrors"        Medication List     TAKE these medications    albuterol 108 (90 Base) MCG/ACT inhaler Commonly known as: ProAir HFA Inhale 2 puffs into the lungs every 6 (six) hours as needed for wheezing.   amLODipine 5 MG tablet Commonly known as: NORVASC Take 5 mg by mouth daily.   celecoxib 200 MG capsule Commonly known as: CELEBREX Take 200 mg by mouth daily.   cholecalciferol 25 MCG (1000 UNIT) tablet Commonly known as: VITAMIN D3 Take 1,000 Units by mouth daily.   esomeprazole 40 MG capsule Commonly known as: NEXIUM Take 40 mg by mouth daily.   fluticasone 50 MCG/ACT nasal spray Commonly known as: FLONASE Place 2 sprays into both nostrils daily as needed for allergies.   levocetirizine 5 MG tablet Commonly known as: XYZAL Take 0.5 tablets (2.5 mg total) by mouth every evening. What changed: how much to take   LORazepam 1 MG tablet Commonly known as: ATIVAN Take 1 mg by mouth at bedtime.   magnesium hydroxide 400 MG/5ML suspension Commonly known as: MILK OF MAGNESIA Take 30 mLs by mouth at bedtime.   metoprolol succinate 25 MG 24 hr tablet Commonly known as: TOPROL-XL Take 25 mg by mouth daily.   montelukast 10 MG tablet Commonly known as: SINGULAIR TAKE ONE TABLET AT BEDTIME. What changed: when to take this   ondansetron 4 MG disintegrating tablet Commonly known as: ZOFRAN-ODT Take 1 tablet (4 mg total) by mouth every 8 (eight) hours as needed for  up to 5 days for nausea or vomiting. What changed:  medication strength how much to take   oxyCODONE 5 MG immediate release tablet Commonly known as: Roxicodone Take 1 tablet (5 mg total) by mouth every 6 (six) hours as needed for up to 3 days.   rosuvastatin 10 MG tablet Commonly known as: CRESTOR Take 10 mg by mouth See admin instructions. Take 10mg  by mouth Monday thru Friday.   valsartan 80 MG tablet Commonly known as: DIOVAN Take 80 mg by  mouth at bedtime.   venlafaxine 37.5 MG tablet Commonly known as: EFFEXOR Take 112.5 mg by mouth daily.         The results of significant diagnostics from this hospitalization (including imaging, microbiology, ancillary and laboratory) are listed below for reference.    Procedures and Diagnostic Studies:   CT ABDOMEN PELVIS W CONTRAST  Result Date: 07/24/2023 CLINICAL DATA:  Acute abdominal pain.  Cyclical vomiting. EXAM: CT ABDOMEN AND PELVIS WITH CONTRAST TECHNIQUE: Multidetector CT imaging of the abdomen and pelvis was performed using the standard protocol following bolus administration of intravenous contrast. RADIATION DOSE REDUCTION: This exam was performed according to the departmental dose-optimization program which includes automated exposure control, adjustment of the mA and/or kV according to patient size and/or use of iterative reconstruction technique. CONTRAST:  85mL OMNIPAQUE IOHEXOL 300 MG/ML  SOLN COMPARISON:  CT 02/28/2023. FINDINGS: Lower chest: Lung bases are clear. No pleural effusion. Small hiatal hernia. Hepatobiliary: Fatty liver infiltration. No space-occupying liver lesion. Gallbladder is nondilated. Patent portal vein. Pancreas: Unremarkable. No pancreatic ductal dilatation or surrounding inflammatory changes. Spleen: Normal in size without focal abnormality. Adrenals/Urinary Tract: Adrenal glands are preserved. No enhancing renal mass or collecting system dilatation. The ureters have normal course and caliber down to  the bladder. Preserved contours of the urinary bladder. Stomach/Bowel: On this non oral contrast exam, the stomach has a normal caliber. There is some mild luminal fluid. The small bowel is nondilated. Large bowel has a normal course and caliber with scattered stool. The appendix is not well seen in the right lower quadrant but no specific pericecal stranding or fluid. Vascular/Lymphatic: Aortic atherosclerosis. No enlarged abdominal or pelvic lymph nodes. Reproductive: Retroverted uterus with calcified fibroids. No separate adnexal mass. Other: No free air or free fluid. Musculoskeletal: Moderate degenerative changes of the spine. Fixation hardware at L4-5 with streak artifact. Appearance is similar to the previous examination. There are areas of stenosis as well including at L5-S1. IMPRESSION: No bowel obstruction, free air or free fluid. The appendix is poorly seen but no pericecal stranding or fluid. The cecum resides in the right hemipelvis. Fatty liver infiltration. Small hiatal hernia. Uterine fibroids. Electronically Signed   By: Karen Kays M.D.   On: 07/24/2023 18:19   DG Chest 1 View  Result Date: 07/24/2023 CLINICAL DATA:  Epigastric pain EXAM: CHEST  1 VIEW COMPARISON:  X-ray 02/27/2023, 03/01/2023 and older FINDINGS: No consolidation, pneumothorax or effusion. No edema. Normal cardiopericardial silhouette. Fixation hardware along the lower cervical spine. Degenerative changes of the spine and shoulders. IMPRESSION: No acute cardiopulmonary disease. Electronically Signed   By: Karen Kays M.D.   On: 07/24/2023 12:49   DG Abdomen 1 View  Result Date: 07/24/2023 CLINICAL DATA:  Nausea and vomiting EXAM: ABDOMEN - 1 VIEW COMPARISON:  X-ray 12/24/2022 FINDINGS: Gas seen in nondilated loops of small and large bowel. No obstruction. Overall minimal small bowel gas overall. Extensive degenerative changes along the spine with fixation hardware along the lower lumbar spine. Overlapping cardiac leads. No  obvious free air. Portions of the left hemidiaphragm in the right lateral hemi abdominal edge are clipped off the edge of the film. IMPRESSION: Limited radiograph with a nonspecific bowel gas pattern. Electronically Signed   By: Karen Kays M.D.   On: 07/24/2023 12:49     Labs:   Basic Metabolic Panel: Recent Labs  Lab 07/24/23 1059 07/25/23 0510 07/26/23 0501  NA 135 128* 130*  K 3.9 2.9* 3.2*  CL 99 94* 98  CO2 21* 20* 20*  GLUCOSE 124* 152* 136*  BUN 8 10 16   CREATININE 0.62 0.58 0.37*  CALCIUM 10.1 9.2 9.6  MG  --  2.0 2.0  PHOS  --   --  2.7   GFR Estimated Creatinine Clearance: 68.3 mL/min (A) (by C-G formula based on SCr of 0.37 mg/dL (L)). Liver Function Tests: Recent Labs  Lab 07/24/23 1059  AST 19  ALT 14  ALKPHOS 79  BILITOT 0.6  PROT 8.0  ALBUMIN 4.9   Recent Labs  Lab 07/24/23 1059  LIPASE 17   No results for input(s): "AMMONIA" in the last 168 hours. Coagulation profile No results for input(s): "INR", "PROTIME" in the last 168 hours.  CBC: Recent Labs  Lab 07/24/23 1059 07/25/23 0510 07/26/23 0501  WBC 12.7* 12.9* 13.1*  NEUTROABS  --   --  8.4*  HGB 13.2 12.6 12.1  HCT 40.1 36.8 36.9  MCV 76.4* 74.6* 77.7*  PLT 445* 442* 446*   Cardiac Enzymes: No results for input(s): "CKTOTAL", "CKMB", "CKMBINDEX", "TROPONINI" in the last 168 hours. BNP: Invalid input(s): "POCBNP" CBG: No results for input(s): "GLUCAP" in the last 168 hours. D-Dimer No results for input(s): "DDIMER" in the last 72 hours. Hgb A1c No results for input(s): "HGBA1C" in the last 72 hours. Lipid Profile No results for input(s): "CHOL", "HDL", "LDLCALC", "TRIG", "CHOLHDL", "LDLDIRECT" in the last 72 hours. Thyroid function studies No results for input(s): "TSH", "T4TOTAL", "T3FREE", "THYROIDAB" in the last 72 hours.  Invalid input(s): "FREET3" Anemia work up No results for input(s): "VITAMINB12", "FOLATE", "FERRITIN", "TIBC", "IRON", "RETICCTPCT" in the last 72  hours. Microbiology No results found for this or any previous visit (from the past 240 hour(s)).  Time coordinating discharge: 45 minutes  Signed: Chasyn Cinque  [facility] 07/26/2023, 11:47 AM

## 2023-07-26 NOTE — Progress Notes (Signed)
Patient is having drug seeking actions. Patient screaming yelling rocking and panting asking for pain medication and nausea medications the minute they are due.  Refused AM meds but tolerated 100% of breakfast. Educated patient on the importance to remain NPO until MD is able to see her regarding continuous nausea. Patient asked for ice chips from another staff member after RN asked her not to eat ice chips since food made her nauseous. No active vomiting noted by staff.

## 2023-07-26 NOTE — Plan of Care (Signed)

## 2023-09-10 DIAGNOSIS — E039 Hypothyroidism, unspecified: Secondary | ICD-10-CM | POA: Diagnosis not present

## 2023-09-10 DIAGNOSIS — D518 Other vitamin B12 deficiency anemias: Secondary | ICD-10-CM | POA: Diagnosis not present

## 2023-09-10 DIAGNOSIS — N951 Menopausal and female climacteric states: Secondary | ICD-10-CM | POA: Diagnosis not present

## 2023-09-10 DIAGNOSIS — R799 Abnormal finding of blood chemistry, unspecified: Secondary | ICD-10-CM | POA: Diagnosis not present

## 2023-09-10 DIAGNOSIS — E559 Vitamin D deficiency, unspecified: Secondary | ICD-10-CM | POA: Diagnosis not present

## 2023-11-27 ENCOUNTER — Encounter: Payer: BC Managed Care – PPO | Admitting: Hematology & Oncology

## 2023-11-27 ENCOUNTER — Inpatient Hospital Stay: Payer: BC Managed Care – PPO

## 2023-12-01 ENCOUNTER — Encounter (HOSPITAL_COMMUNITY): Payer: Self-pay

## 2023-12-01 ENCOUNTER — Emergency Department (HOSPITAL_COMMUNITY)
Admission: EM | Admit: 2023-12-01 | Discharge: 2023-12-01 | Disposition: A | Discharge status: Home / Self Care | Payer: BC Managed Care – PPO

## 2023-12-01 ENCOUNTER — Other Ambulatory Visit: Payer: Self-pay

## 2023-12-01 ENCOUNTER — Emergency Department (HOSPITAL_COMMUNITY): Payer: BC Managed Care – PPO

## 2023-12-01 DIAGNOSIS — D72829 Elevated white blood cell count, unspecified: Secondary | ICD-10-CM | POA: Insufficient documentation

## 2023-12-01 DIAGNOSIS — I10 Essential (primary) hypertension: Secondary | ICD-10-CM | POA: Diagnosis not present

## 2023-12-01 DIAGNOSIS — J45909 Unspecified asthma, uncomplicated: Secondary | ICD-10-CM | POA: Insufficient documentation

## 2023-12-01 DIAGNOSIS — R109 Unspecified abdominal pain: Secondary | ICD-10-CM | POA: Diagnosis not present

## 2023-12-01 DIAGNOSIS — R112 Nausea with vomiting, unspecified: Secondary | ICD-10-CM | POA: Diagnosis not present

## 2023-12-01 DIAGNOSIS — F12188 Cannabis abuse with other cannabis-induced disorder: Secondary | ICD-10-CM | POA: Diagnosis not present

## 2023-12-01 DIAGNOSIS — Z7951 Long term (current) use of inhaled steroids: Secondary | ICD-10-CM | POA: Insufficient documentation

## 2023-12-01 DIAGNOSIS — D259 Leiomyoma of uterus, unspecified: Secondary | ICD-10-CM | POA: Diagnosis not present

## 2023-12-01 DIAGNOSIS — K760 Fatty (change of) liver, not elsewhere classified: Secondary | ICD-10-CM | POA: Diagnosis not present

## 2023-12-01 DIAGNOSIS — R1111 Vomiting without nausea: Secondary | ICD-10-CM | POA: Diagnosis not present

## 2023-12-01 LAB — COMPREHENSIVE METABOLIC PANEL
ALT: 22 U/L (ref 0–44)
AST: 26 U/L (ref 15–41)
Albumin: 4.9 g/dL (ref 3.5–5.0)
Alkaline Phosphatase: 78 U/L (ref 38–126)
Anion gap: 15 (ref 5–15)
BUN: 7 mg/dL — ABNORMAL LOW (ref 8–23)
CO2: 18 mmol/L — ABNORMAL LOW (ref 22–32)
Calcium: 10 mg/dL (ref 8.9–10.3)
Chloride: 103 mmol/L (ref 98–111)
Creatinine, Ser: 0.57 mg/dL (ref 0.44–1.00)
GFR, Estimated: >60 mL/min (ref >60)
Glucose, Bld: 153 mg/dL — ABNORMAL HIGH (ref 70–99)
Potassium: 3.1 mmol/L — ABNORMAL LOW (ref 3.5–5.1)
Sodium: 136 mmol/L (ref 135–145)
Total Bilirubin: 0.8 mg/dL (ref 0.0–1.2)
Total Protein: 8.3 g/dL — ABNORMAL HIGH (ref 6.5–8.1)

## 2023-12-01 LAB — CBC
HCT: 42.8 % (ref 36.0–46.0)
Hemoglobin: 14.4 g/dL (ref 12.0–15.0)
MCH: 26.1 pg (ref 26.0–34.0)
MCHC: 33.6 g/dL (ref 30.0–36.0)
MCV: 77.7 fL — ABNORMAL LOW (ref 80.0–100.0)
Platelets: 453 10*3/uL — ABNORMAL HIGH (ref 150–400)
RBC: 5.51 MIL/uL — ABNORMAL HIGH (ref 3.87–5.11)
RDW: 19.8 % — ABNORMAL HIGH (ref 11.5–15.5)
WBC: 11.9 10*3/uL — ABNORMAL HIGH (ref 4.0–10.5)
nRBC: 0 % (ref 0.0–0.2)

## 2023-12-01 LAB — LIPASE, BLOOD: Lipase: 22 U/L (ref 11–51)

## 2023-12-01 MED ORDER — POTASSIUM CHLORIDE CRYS ER 20 MEQ PO TBCR
40.0000 meq | EXTENDED_RELEASE_TABLET | Freq: Once | ORAL | Status: AC
  Administered 2023-12-01: 40 meq via ORAL
  Filled 2023-12-01: qty 2

## 2023-12-01 MED ORDER — DROPERIDOL 2.5 MG/ML IJ SOLN
1.2500 mg | Freq: Once | INTRAMUSCULAR | Status: AC
  Administered 2023-12-01: 1.25 mg via INTRAVENOUS
  Filled 2023-12-01: qty 2

## 2023-12-01 MED ORDER — IOHEXOL 300 MG/ML  SOLN
100.0000 mL | Freq: Once | INTRAMUSCULAR | Status: AC | PRN
  Administered 2023-12-01: 100 mL via INTRAVENOUS

## 2023-12-01 MED ORDER — POTASSIUM CHLORIDE CRYS ER 20 MEQ PO TBCR: 20.0000 meq | EXTENDED_RELEASE_TABLET | Freq: Two times a day (BID) | ORAL | 0 refills | Status: DC

## 2023-12-01 MED ORDER — ONDANSETRON HCL 4 MG PO TABS: 4.0000 mg | ORAL_TABLET | Freq: Four times a day (QID) | ORAL | 0 refills | Status: DC | PRN

## 2023-12-01 MED ORDER — SODIUM CHLORIDE 0.9 % IV BOLUS
500.0000 mL | Freq: Once | INTRAVENOUS | Status: AC
  Administered 2023-12-01: 500 mL via INTRAVENOUS

## 2023-12-01 NOTE — ED Notes (Signed)
ED PA at BS 

## 2023-12-01 NOTE — ED Triage Notes (Signed)
 [name] for abdominal pain and nausea x 3 hours. Given zofran and fentanyl with EMS.

## 2023-12-01 NOTE — ED Notes (Signed)
 Sleeping, arousable to voice, calmer, NAD, no emesis noted, none since arrival.

## 2023-12-01 NOTE — ED Notes (Signed)
 Pt on floor in lobby, Tech helped Pt up off floor

## 2023-12-01 NOTE — ED Notes (Signed)
 [place], to CT

## 2023-12-01 NOTE — ED Notes (Signed)
 Pt reluctant to leave, started to hyperventilate, encouraged to slow breathing, requesting phone, aware phone is in the lobby.

## 2023-12-01 NOTE — ED Notes (Signed)
 C/o pain, nausea, dry mouth. Hyperventilating.

## 2023-12-01 NOTE — Discharge Instructions (Signed)
 Please follow-up with your PCP regarding your nausea and vomiting.  I am sending home potassium as well as Zofran .  Please take the potassium once a day for the next 4 days.  Please take Zofran  every 6 hours as needed for nausea and vomiting.  Please discontinue use of marijuana as I feel as if this is the cause of your symptoms here today.  Please read the attached guide concerning cannabis use disorder.  Return for any new or worsening symptoms.

## 2023-12-01 NOTE — ED Notes (Signed)
 Pt requesting nauseous medication while in lobby RN Morrie Sheldon was notified

## 2023-12-01 NOTE — ED Provider Notes (Signed)
 [place] EMERGENCY DEPARTMENT AT [facility] Provider Note   CSN: [id number] Arrival date & time: 12/01/23  1313     History  Chief Complaint  Patient presents with   Abdominal Pain    Katie Chang is a 63 y.o. 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  Patient presents to ED for evaluation of abdominal pain, vomiting.  Reports that she started having abdominal pain about 9 AM this morning associated with nausea and vomiting.  Denies fevers, dysuria, diarrhea at home.  States that she is still using marijuana but reports that she has cut back significantly.  States last use was yesterday.  Has not established follow-up care with GI yet.  Was hospitalized on July 24, 2023 for intractable nausea vomiting, had unremarkable endoscopy which showed gastritis.  She is requesting Dilaudid .   Abdominal Pain Associated symptoms: nausea and vomiting   Associated symptoms: no diarrhea, no fever and no shortness of breath        Home Medications Prior to Admission medications   Medication Sig Start Date End Date Taking? Authorizing Provider  albuterol  (PROAIR  HFA) 108 (90 BASE) MCG/ACT inhaler Inhale 2 puffs into the lungs every 6 (six) hours as needed for wheezing. 03/11/13   Abigail Bernardino HERO, PA-C  amLODipine  (NORVASC ) 5 MG tablet Take 5 mg by mouth daily. 03/29/22   [provider]  celecoxib (CELEBREX) 200 MG capsule Take 200 mg by mouth daily. 05/23/22   [provider]  cholecalciferol  (VITAMIN D3) 25 MCG (1000 UT) tablet Take 1,000 Units by mouth daily.    [provider]  esomeprazole (NEXIUM) 40 MG capsule Take 40 mg by mouth daily. 03/04/18   [provider]  fluticasone  (FLONASE ) 50 MCG/ACT nasal spray Place 2 sprays into both nostrils daily as needed for allergies.    [provider]  levocetirizine (XYZAL ) 5 MG tablet Take 0.5 tablets (2.5 mg total) by mouth  every evening. Patient taking differently: Take 5 mg by mouth every evening. 03/03/23   Briana Elgin LABOR, MD  LORazepam  (ATIVAN ) 1 MG tablet Take 1 mg by mouth at bedtime. 04/09/18   [provider]  magnesium  hydroxide (MILK OF MAGNESIA) 400 MG/5ML suspension Take 30 mLs by mouth at bedtime.    [provider]  metoprolol  succinate (TOPROL -XL) 25 MG 24 hr tablet Take 25 mg by mouth daily. 04/12/22   [provider]  montelukast  (SINGULAIR ) 10 MG tablet TAKE ONE TABLET AT BEDTIME. Patient taking differently: Take 10 mg by mouth every morning. 04/22/14   Derden, Kristi M, MD  rosuvastatin  (CRESTOR ) 10 MG tablet Take 10 mg by mouth See admin instructions. Take 10mg  by mouth Monday thru Friday. 03/29/22   [provider]  valsartan (DIOVAN) 80 MG tablet Take 80 mg by mouth at bedtime. 03/07/22   [provider]  venlafaxine  (EFFEXOR ) 37.5 MG tablet Take 112.5 mg by mouth daily. 07/31/22   [provider]      Allergies    Avelox [moxifloxacin hcl in nacl] and Haldol  [haloperidol  lactate]    Review of Systems   Review of Systems  Constitutional:  Negative for fever.  Respiratory:  Negative for shortness of breath.   Gastrointestinal:  Positive for abdominal pain, nausea and vomiting. Negative for diarrhea.  All other systems reviewed and are negative.   Physical Exam Updated Vital Signs BP (!) 135/124 (BP Location: Left Arm)   Pulse 81   Temp 98.2  F (36.8 C) (Oral)   Resp 18   Ht 5' 3 (1.6 m)   Wt 68 kg   SpO2 100%   BMI 26.56 kg/m  Physical Exam Vitals and nursing note reviewed.  Constitutional:      General: She is in acute distress.     Appearance: Normal appearance. She is not ill-appearing, toxic-appearing or diaphoretic.  HENT:     Head: Normocephalic and atraumatic.     Nose: Nose normal.     Mouth/Throat:     Mouth: Mucous membranes are moist.     Pharynx: Oropharynx is clear.  Eyes:     Extraocular Movements:  Extraocular movements intact.     Conjunctiva/sclera: Conjunctivae normal.     Pupils: Pupils are equal, round, and reactive to light.  Cardiovascular:     Rate and Rhythm: Normal rate and regular rhythm.  Pulmonary:     Effort: Pulmonary effort is normal.     Breath sounds: Normal breath sounds. No wheezing.  Abdominal:     General: Abdomen is flat. Bowel sounds are normal.     Palpations: Abdomen is soft.     Tenderness: There is no abdominal tenderness.  Musculoskeletal:     Cervical back: Normal range of motion and neck supple.  Skin:    General: Skin is warm and dry.     Capillary Refill: Capillary refill takes less than 2 seconds.  Neurological:     Mental Status: She is alert and oriented to person, place, and time.     ED Results / Procedures / Treatments   Labs (all labs ordered are listed, but only abnormal results are displayed) Labs Reviewed  COMPREHENSIVE METABOLIC PANEL - Abnormal; Notable for the following components:      Result Value   Potassium 3.1 (*)    CO2 18 (*)    Glucose, Bld 153 (*)    BUN 7 (*)    Total Protein 8.3 (*)    All other components within normal limits  CBC - Abnormal; Notable for the following components:   WBC 11.9 (*)    RBC 5.51 (*)    MCV 77.7 (*)    RDW 19.8 (*)    Platelets 453 (*)    All other components within normal limits  LIPASE, BLOOD  URINALYSIS, ROUTINE W REFLEX MICROSCOPIC    EKG None  Radiology CT ABDOMEN PELVIS W CONTRAST Result Date: 12/01/2023 CLINICAL DATA:  Abdominal pain, nausea EXAM: CT ABDOMEN AND PELVIS WITH CONTRAST TECHNIQUE: Multidetector CT imaging of the abdomen and pelvis was performed using the standard protocol following bolus administration of intravenous contrast. RADIATION DOSE REDUCTION: This exam was performed according to the departmental dose-optimization program which includes automated exposure control, adjustment of the mA and/or kV according to patient size and/or use of iterative  reconstruction technique. CONTRAST:  OMNIPAQUE  IOHEXOL  300 MG/ML  SOLN COMPARISON:  07/24/2023 FINDINGS: Lower chest: No acute abnormality Hepatobiliary: Diffuse low-density throughout the liver compatible with fatty infiltration. No focal abnormality. Gallbladder unremarkable. Pancreas: No focal abnormality or ductal dilatation. Spleen: No focal abnormality.  Normal size. Adrenals/Urinary Tract: No adrenal abnormality. No focal renal abnormality. No stones or hydronephrosis. Urinary bladder is unremarkable. Stomach/Bowel: Normal appendix. Stomach, large and small bowel grossly unremarkable. Vascular/Lymphatic: No evidence of aneurysm or adenopathy. Moderate infrarenal aortic atherosclerosis Reproductive: Calcified fibroid posteriorly in the uterus measuring approximately 3.5 cm. No adnexal mass. Other: No free fluid or free air. Musculoskeletal: No acute bony abnormality. Postoperative and degenerative changes in  the lumbar spine. IMPRESSION: No acute findings in the abdomen or pelvis. Hepatic steatosis. Aortic atherosclerosis. Fibroid uterus. Electronically Signed   By: Franky Crease M.D.   On: 12/01/2023 17:48    Procedures Procedures   Medications Ordered in ED Medications  droperidol  (INAPSINE ) 2.5 MG/ML injection 1.25 mg (1.25 mg Intravenous Given 12/01/23 1622)  sodium chloride  0.9 % bolus 500 mL (0 mLs Intravenous Stopped 12/01/23 1807)  iohexol  (OMNIPAQUE ) 300 MG/ML solution 100 mL (100 mLs Intravenous Contrast Given 12/01/23 1630)  potassium chloride  SA (KLOR-CON  M) CR tablet 40 mEq (40 mEq Oral Given 12/01/23 1809)    ED Course/ Medical Decision Making/ A&P  Medical Decision Making Amount and/or Complexity of Data Reviewed Labs: ordered.   63 year old female presents to ED for evaluation abdominal pain, nausea and vomiting.  Please see HPI for further details.  On examination patient moaning out in the hallway.  Requesting Dilaudid  by name.  Complaining of abdominal pain.  She is  afebrile, nontachycardic.  Her lung sounds are clear bilaterally, she is not hypoxic.  Her abdomen is soft and compressible with no tenderness noted.  Neurological examination is at baseline.  Chart reviewed.  Patient has history of cyclic vomiting secondary to cannabinoid hyperemesis syndrome.  Will provide patient with droperidol .  Will collect EKG prior to doing this to ensure no QT prolongation.  Will also collect abdominal pain labs and CT scan of abdomen and pelvis due to patient distress.  Patient CBC with a slight leukocytosis to 11.9, hemoglobin stable at 14.4.  Suspect leukocytosis secondary to stress response.  She is afebrile and nontachycardic.  Her lipase is WNL.  Her CMP shows potassium 3.1, glucose 153, bicarb 18, anion gap 15.  CT abdomen pelvis shows no acute process that would explain patient's symptoms.  After patient given 1.25 milligram IV droperidol , patient noted to be resting comfortably in the hallway.  She has had no nausea or vomiting since receiving droperidol .  This has been confirmed with nursing staff.  Nursing staff reports the patient has had no nausea or vomiting.  She passed p.o. fluid challenge and was given oral potassium at 40 mEq.  Suspect patient symptoms secondary to marijuana use.  Have encouraged her to discontinue use of marijuana.  Will send her home with a few days of potassium.  Have encouraged her to follow-up with her PCP.  Patient had all of her questions answered to her satisfaction.  She stable to discharge home.   Final Clinical Impression(s) / ED Diagnoses Final diagnoses:  Cannabinoid hyperemesis syndrome    Rx / DC Orders ED Discharge Orders     None         Ruthell Lonni FALCON, PA-C 12/01/23 1846    Neysa Caron PARAS, DO 12/01/23 2213

## 2023-12-01 NOTE — ED Provider Triage Note (Signed)
 Emergency Medicine Provider Triage Evaluation Note  Kerrie Timm , a 63 y.o. female  was evaluated in triage.  Pt complains of 6 hour Hx of n/v and epigastric pain. Had endoscopy done 02/2023. Has been seen multiple times in 2024 for same issue, notes have attributed this possibly to marijuana use. Pt endorses marijuana use last night.   Review of Systems  Positive: Chills Negative: Fever, headache, chest pain, diarrhea, shortness of breath, dysuria.   Physical Exam  BP (!) 135/124 (BP Location: Left Arm)   Pulse 81   Temp 98.2 F (36.8 C) (Oral)   Resp 18   Ht 5' 3 (1.6 m)   Wt 68 kg   SpO2 100%   BMI 26.56 kg/m  Gen:   Awake, appears very distressed and tearful, able to speak in full sentences and yell demands Resp:  Normal effort  MSK:   Moves extremities without difficulty  Other:    Medical Decision Making  Medically screening exam initiated at 1:53 PM.  Appropriate orders placed.  Mariska Daffin was informed that the remainder of the evaluation will be completed by another provider, this initial triage assessment does not replace that evaluation, and the importance of remaining in the ED until their evaluation is complete.     Beola Terrall RAMAN, [place] 12/01/23 1358

## 2023-12-03 ENCOUNTER — Other Ambulatory Visit: Payer: Self-pay

## 2023-12-03 ENCOUNTER — Emergency Department (HOSPITAL_BASED_OUTPATIENT_CLINIC_OR_DEPARTMENT_OTHER)
Admission: EM | Admit: 2023-12-03 | Discharge: 2023-12-04 | Discharge status: Against Medical Advice | Payer: BC Managed Care – PPO | Attending: Emergency Medicine | Admitting: Emergency Medicine

## 2023-12-03 ENCOUNTER — Encounter (HOSPITAL_BASED_OUTPATIENT_CLINIC_OR_DEPARTMENT_OTHER): Payer: Self-pay | Admitting: *Deleted

## 2023-12-03 ENCOUNTER — Ambulatory Visit: Payer: Self-pay

## 2023-12-03 DIAGNOSIS — G43A0 Cyclical vomiting, not intractable: Secondary | ICD-10-CM | POA: Insufficient documentation

## 2023-12-03 DIAGNOSIS — E876 Hypokalemia: Secondary | ICD-10-CM | POA: Diagnosis not present

## 2023-12-03 DIAGNOSIS — R112 Nausea with vomiting, unspecified: Secondary | ICD-10-CM | POA: Diagnosis present

## 2023-12-03 DIAGNOSIS — R109 Unspecified abdominal pain: Secondary | ICD-10-CM | POA: Insufficient documentation

## 2023-12-03 DIAGNOSIS — R1111 Vomiting without nausea: Secondary | ICD-10-CM | POA: Diagnosis not present

## 2023-12-03 DIAGNOSIS — E860 Dehydration: Secondary | ICD-10-CM | POA: Diagnosis not present

## 2023-12-03 DIAGNOSIS — R1115 Cyclical vomiting syndrome unrelated to migraine: Secondary | ICD-10-CM | POA: Diagnosis not present

## 2023-12-03 DIAGNOSIS — R000 Tachycardia, unspecified: Secondary | ICD-10-CM | POA: Diagnosis not present

## 2023-12-03 DIAGNOSIS — J45909 Unspecified asthma, uncomplicated: Secondary | ICD-10-CM | POA: Insufficient documentation

## 2023-12-03 DIAGNOSIS — E871 Hypo-osmolality and hyponatremia: Secondary | ICD-10-CM | POA: Insufficient documentation

## 2023-12-03 DIAGNOSIS — R1084 Generalized abdominal pain: Secondary | ICD-10-CM | POA: Diagnosis not present

## 2023-12-03 LAB — COMPREHENSIVE METABOLIC PANEL
ALT: 21 U/L (ref 0–44)
AST: 34 U/L (ref 15–41)
Albumin: 5.2 g/dL — ABNORMAL HIGH (ref 3.5–5.0)
Alkaline Phosphatase: 66 U/L (ref 38–126)
Anion gap: 16 — ABNORMAL HIGH (ref 5–15)
BUN: 18 mg/dL (ref 8–23)
CO2: 20 mmol/L — ABNORMAL LOW (ref 22–32)
Calcium: 10.2 mg/dL (ref 8.9–10.3)
Chloride: 88 mmol/L — ABNORMAL LOW (ref 98–111)
Creatinine, Ser: 0.88 mg/dL (ref 0.44–1.00)
GFR, Estimated: >60 mL/min (ref >60)
Glucose, Bld: 110 mg/dL — ABNORMAL HIGH (ref 70–99)
Potassium: 3.1 mmol/L — ABNORMAL LOW (ref 3.5–5.1)
Sodium: 124 mmol/L — ABNORMAL LOW (ref 135–145)
Total Bilirubin: 1.2 mg/dL (ref 0.0–1.2)
Total Protein: 7.8 g/dL (ref 6.5–8.1)

## 2023-12-03 LAB — CBC
HCT: 38.6 % (ref 36.0–46.0)
Hemoglobin: 13.6 g/dL (ref 12.0–15.0)
MCH: 26.2 pg (ref 26.0–34.0)
MCHC: 35.2 g/dL (ref 30.0–36.0)
MCV: 74.4 fL — ABNORMAL LOW (ref 80.0–100.0)
Platelets: 457 10*3/uL — ABNORMAL HIGH (ref 150–400)
RBC: 5.19 MIL/uL — ABNORMAL HIGH (ref 3.87–5.11)
RDW: 19.9 % — ABNORMAL HIGH (ref 11.5–15.5)
WBC: 19.7 10*3/uL — ABNORMAL HIGH (ref 4.0–10.5)
nRBC: 0 % (ref 0.0–0.2)

## 2023-12-03 LAB — LIPASE, BLOOD: Lipase: <10 U/L — ABNORMAL LOW (ref 11–51)

## 2023-12-03 LAB — MAGNESIUM: Magnesium: 1.9 mg/dL (ref 1.7–2.4)

## 2023-12-03 MED ORDER — POTASSIUM CHLORIDE 10 MEQ/100ML IV SOLN
10.0000 meq | INTRAVENOUS | Status: AC
  Administered 2023-12-03 – 2023-12-04 (×6): 10 meq via INTRAVENOUS
  Filled 2023-12-03 (×6): qty 100

## 2023-12-03 MED ORDER — ONDANSETRON HCL 4 MG/2ML IJ SOLN: 4.0000 mg | Freq: Once | INTRAMUSCULAR | Status: DC

## 2023-12-03 MED ORDER — ONDANSETRON 4 MG PO TBDP
4.0000 mg | ORAL_TABLET | ORAL | Status: AC
  Administered 2023-12-03: 4 mg via ORAL
  Filled 2023-12-03: qty 1

## 2023-12-03 MED ORDER — HYDROMORPHONE HCL 1 MG/ML IJ SOLN
0.5000 mg | Freq: Once | INTRAMUSCULAR | Status: AC
  Administered 2023-12-03: 0.5 mg via INTRAVENOUS
  Filled 2023-12-03: qty 1

## 2023-12-03 MED ORDER — LACTATED RINGERS IV BOLUS
1000.0000 mL | Freq: Once | INTRAVENOUS | Status: AC
  Administered 2023-12-03: 1000 mL via INTRAVENOUS

## 2023-12-03 MED ORDER — SODIUM CHLORIDE 0.9 % IV SOLN: Freq: Once | INTRAVENOUS | Status: AC

## 2023-12-03 MED ORDER — DROPERIDOL 2.5 MG/ML IJ SOLN
1.2500 mg | Freq: Once | INTRAMUSCULAR | Status: AC
  Administered 2023-12-03: 1.25 mg via INTRAVENOUS
  Filled 2023-12-03: qty 2

## 2023-12-03 NOTE — ED Triage Notes (Signed)
 Pt with cyclic vomiting syndrome is here by EMS for 3 days of nausea and vomiting.  Pt was seen yesterday at [facility] but her symptoms were not resolved, she states that she requires dilaudid  and zofran  IV to break through this cycle.

## 2023-12-03 NOTE — ED Notes (Signed)
 ED Provider at bedside.

## 2023-12-03 NOTE — Telephone Encounter (Signed)
  Chief Complaint: vomiting, severe pain located bottom of sternum Symptoms: Neck pain ( pt stated has neck pain at baseline h/o 2 level fusion), dizziness, hyperventilation Frequency: 3 days Pertinent Negatives: Patient denies diarrhea Disposition: [x] ED /[] Urgent Care (no appt availability in office) / [] Appointment(In office/virtual)/ []  [facility]/ [] Home Care/ [] Refused Recommended Disposition /[] [place] [facility]/ []  Follow-up with PCP Additional Notes: called 911 for pt  Reason for Disposition  Sounds like a life-threatening emergency to the triager  Answer Assessment - Initial Assessment Questions 1. VOMITING SEVERITY: "How many times have you vomited in the past 24 hours?"     - MILD:  1 - 2 times/day    - MODERATE: 3 - 5 times/day, decreased oral intake without significant weight loss or symptoms of dehydration    - SEVERE: 6 or more times/day, vomits everything or nearly everything, with significant weight loss, symptoms of dehydration      Severe- rust colored bloody 2. ONSET: "When did the vomiting begin?"      Has h/o cyclic vomiting 3. FLUIDS: "What fluids or food have you vomited up today?" "Have you been able to keep any fluids down?"     Ice  4. ABDOMEN PAIN: "Are your having any abdomen pain?" If Yes : "How bad is it and what does it feel like?" (e.g., crampy, dull, intermittent, constant)      8/10 constant  5. DIARRHEA: "Is there any diarrhea?" If Yes, ask: "How many times today?"      no  7. CAUSE: "What do you think is causing your vomiting?"     no 8. HYDRATION STATUS: "Any signs of dehydration?" (e.g., dry mouth [not only dry lips], too weak to stand) "When did you last urinate?"     Dizziness 9. OTHER SYMPTOMS: "Do you have any other symptoms?" (e.g., fever, headache, vertigo, vomiting blood or coffee grounds, recent head injury)     Hyperventilation//abd pain, base of sternum,  Protocols used: Vomiting-A-AH

## 2023-12-03 NOTE — ED Notes (Addendum)
 Attempted labs x2. Unsuccessful

## 2023-12-03 NOTE — ED Provider Notes (Signed)
[place] EMERGENCY DEPARTMENT AT [facility] Provider Note   CSN: [id number] Arrival date & time: 12/03/23  1443     History Chief Complaint  Patient presents with   Emesis    Katie Chang is a 63 y.o. 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 She was seen on 12/01/23 and reports that is when the symptoms began. She is still using marijuana, last use was three days ago. She has not followed up with GI. She reports that she is dry heaving and is not able to keep down any fluids or food. Reports some "soreness" to her abdomen. She is still passing gas. Denies any bowel or bladder changes. She is requesting 2mg  of dilaudid.     Emesis Associated symptoms: abdominal pain   Associated symptoms: no chills, no diarrhea and no fever        Home Medications Prior to Admission medications   Medication Sig Start Date End Date Taking? Authorizing Provider  albuterol (PROAIR HFA) 108 (90 BASE) MCG/ACT inhaler Inhale 2 puffs into the lungs every 6 (six) hours as needed for wheezing. 03/11/13   Sondra Barges, PA-C  amLODipine (NORVASC) 5 MG tablet Take 5 mg by mouth daily. 03/29/22   [provider]  celecoxib (CELEBREX) 200 MG capsule Take 200 mg by mouth daily. 05/23/22   [provider]  cholecalciferol (VITAMIN D3) 25 MCG (1000 UT) tablet Take 1,000 Units by mouth daily.    [provider]  esomeprazole (NEXIUM) 40 MG capsule Take 40 mg by mouth daily. 03/04/18   [provider]  fluticasone (FLONASE) 50 MCG/ACT nasal spray Place 2 sprays into both nostrils daily as needed for allergies.    [provider]  levocetirizine (XYZAL) 5 MG tablet Take 0.5 tablets (2.5 mg total) by mouth every evening. Patient taking differently: Take 5 mg by mouth every evening. 03/03/23   Narda Bonds, MD  LORazepam (ATIVAN) 1 MG tablet  Take 1 mg by mouth at bedtime. 04/09/18   [provider]  magnesium hydroxide (MILK OF MAGNESIA) 400 MG/5ML suspension Take 30 mLs by mouth at bedtime.    [provider]  metoprolol succinate (TOPROL-XL) 25 MG 24 hr tablet Take 25 mg by mouth daily. 04/12/22   [provider]  montelukast (SINGULAIR) 10 MG tablet TAKE ONE TABLET AT BEDTIME. Patient taking differently: Take 10 mg by mouth every morning. 04/22/14   Ethelda Chick, MD  ondansetron (ZOFRAN) 4 MG tablet Take 1 tablet (4 mg total) by mouth every 6 (six) hours as needed for nausea or vomiting. 12/01/23   Al Decant, PA-C  potassium chloride SA (KLOR-CON M) 20 MEQ tablet Take 1 tablet (20 mEq total) by mouth 2 (two) times daily. 12/01/23   Al Decant, PA-C  rosuvastatin (CRESTOR) 10 MG tablet Take 10 mg by mouth See admin instructions. Take 10mg  by mouth Monday thru Friday. 03/29/22   [provider]  valsartan (DIOVAN) 80 MG tablet Take 80 mg by mouth at bedtime. 03/07/22   [provider]  venlafaxine (EFFEXOR) 37.5 MG tablet Take 112.5 mg by mouth daily. 07/31/22   [provider]      Allergies    Avelox [moxifloxacin hcl in nacl] and Haldol [haloperidol lactate]    Review of Systems   Review of Systems  Constitutional:  Negative for chills and fever.  Respiratory:  Negative for shortness of breath.   Cardiovascular:  Negative for chest pain.  Gastrointestinal:  Positive for abdominal pain, nausea and vomiting. Negative for constipation and diarrhea.  Genitourinary:  Negative for dysuria and hematuria.    Physical Exam Updated Vital Signs BP (!) 154/48   Pulse (!) 124   Temp 99 F (37.2 C)   Resp (!) 26   SpO2 100%  Physical Exam Vitals and nursing note reviewed.  Constitutional:      Comments: Hyperventilating however speaking in full sentences  HENT:     Mouth/Throat:     Mouth: Mucous membranes are dry.  Eyes:     General: No scleral  icterus. Cardiovascular:     Rate and Rhythm: Tachycardia present.  Pulmonary:     Breath sounds: Normal breath sounds.  Abdominal:     Palpations: Abdomen is soft.     Tenderness: There is no abdominal tenderness. There is no guarding or rebound.     Comments: Abdomen is soft and nontender.  Skin:    General: Skin is warm and dry.  Neurological:     Mental Status: She is alert.     ED Results / Procedures / Treatments   Labs (all labs ordered are listed, but only abnormal results are displayed) Labs Reviewed  LIPASE, BLOOD - Abnormal; Notable for the following components:      Result Value   Lipase <10 (*)    All other components within normal limits  COMPREHENSIVE METABOLIC PANEL - Abnormal; Notable for the following components:   Sodium 124 (*)    Potassium 3.1 (*)    Chloride 88 (*)    CO2 20 (*)    Glucose, Bld 110 (*)    Albumin 5.2 (*)    Anion gap 16 (*)    All other components within normal limits  CBC - Abnormal; Notable for the following components:   WBC 19.7 (*)    RBC 5.19 (*)    MCV 74.4 (*)    RDW 19.9 (*)    Platelets 457 (*)    All other components within normal limits  URINALYSIS, ROUTINE W REFLEX MICROSCOPIC  MAGNESIUM  OSMOLALITY, URINE  OSMOLALITY    EKG EKG Interpretation Date/Time:  Wednesday December 03 2023 14:55:37 EST Ventricular Rate:  124 PR Interval:  152 QRS Duration:  78 QT Interval:  318 QTC Calculation: 456 R Axis:   55  Text Interpretation: Sinus tachycardia Possible Left atrial enlargement Minimal voltage criteria for LVH, may be normal variant ( Sokolow-Lyon ) Marked ST abnormality, possible inferior subendocardial injury Abnormal ECG Interpretation limited secondary to artifact Confirmed by Vonita Moss [phone]) on 12/03/2023 3:51:01 PM  Radiology No results found.  Procedures Procedures   Medications Ordered in ED Medications  lactated ringers bolus 1,000 mL (has no administration in time range)  potassium  chloride 10 mEq in 100 mL IVPB (has no administration in time range)  HYDROmorphone (DILAUDID) injection 0.5 mg (has no administration in time range)  droperidol (INAPSINE) 2.5 MG/ML injection 1.25 mg (has no administration in time range)  ondansetron (ZOFRAN-ODT) disintegrating tablet 4 mg (4 mg Oral Given 12/03/23 1603)    ED Course/ Medical Decision Making/ A&P   Medical Decision Making Amount and/or Complexity of Data Reviewed Labs: ordered.  Risk Prescription drug management. Decision regarding hospitalization.   63 y.o. female presents to the ER for evaluation of vomiting. Differential diagnosis includes but is not limited to ACS/MI, Boerhaave's, DKA, elevated ICP, Ischemic bowel, Sepsis, Drug-related (  toxicity, THC hyperemesis, ETOH, withdrawal), Appendicitis, Bowel obstruction, Electrolyte abnormalities, Pancreatitis, Biliary colic, Gastroenteritis, Gastroparesis, Hepatitis, Migraine, Thyroid disease, Renal colic, GERD/PUD, UTI. Vital signs blood pressure 154/48, temp 98.9, pulse rate 109, respiratory rate 25 with 100% on room air.Marland Kitchen Physical exam as noted above.   On previous chart evaluation, patient was seen on 12-01-2023 with similar presentation.  She did CT exam done that showed some fibroid uterus and fatty liver however no acute findings.  She was given droperidol and did not have any emesis while there and was discharged home.  She has had multiple admissions for cyclical vomiting syndrome previously.  I independently reviewed and interpreted the patient's labs.  CBC does show leukocytosis at 19.7, this possibly is stress related.  Platelets are slightly elevated at 457 as well.  No anemia.  Lipase less than 10.  CMP does show new hyponatremia with a sodium 124.  Was 136 just 2 days prior.  Consistent hypokalemia at 3.1.  Chloride decreased to 88.  She has a bicarb of 20 with a gap of 16.  Glucose is at 110.  Albumin slightly elevated at 5.2 without any other electrolyte or LFT  abnormalities.  Urinalysis still needs to be collected.  Magnesium within normal limits.  I have checked the patient's EKG, no prolonged QT.  Droperidol ordered.  I have ordered her a very small amount of Dilaudid as well as some potassium and 1 L fluid.  I have also ordered serum osmol.   Given the patient's dehydration and significant electrolyte abnormalities, she will require admission.  I do not see a need to reCT her abdomen as she is not really complaining of abdominal pain is mainly of the vomiting that is bothering her.   She is given a small amount of Dilaudid, droperidol, and IV fluids.  Patient is aware that she will need to be admitted.  Dr. Loney Loh to admit.  Portions of this report may have been transcribed using voice recognition software. Every effort was made to ensure accuracy; however, inadvertent computerized transcription errors may be present.    Final Clinical Impression(s) / ED Diagnoses Final diagnoses:  Hyponatremia  Hypokalemia  Cyclical vomiting  Dehydration    Rx / DC Orders ED Discharge Orders     None         Achille Rich, Cordelia Poche 12/03/23 2119    Rondel Baton, MD 12/11/23 1201

## 2023-12-03 NOTE — ED Notes (Addendum)
 Pt given mouth swabs per request.  Pt also aware of needing urine sample and has call bell in place if she needs to urinate.

## 2023-12-04 ENCOUNTER — Inpatient Hospital Stay: Payer: BC Managed Care – PPO | Attending: Physician Assistant

## 2023-12-04 ENCOUNTER — Inpatient Hospital Stay: Payer: BC Managed Care – PPO | Admitting: Hematology & Oncology

## 2023-12-04 LAB — URINALYSIS, ROUTINE W REFLEX MICROSCOPIC
Bilirubin Urine: NEGATIVE
Glucose, UA: NEGATIVE mg/dL
Ketones, ur: 40 mg/dL — AB
Nitrite: NEGATIVE
Specific Gravity, Urine: 1.019 (ref 1.005–1.030)
pH: 6.5 (ref 5.0–8.0)

## 2023-12-04 LAB — BASIC METABOLIC PANEL
Anion gap: 12 (ref 5–15)
Anion gap: 9 (ref 5–15)
BUN: 17 mg/dL (ref 8–23)
BUN: 14 mg/dL (ref 8–23)
CO2: 20 mmol/L — ABNORMAL LOW (ref 22–32)
CO2: 22 mmol/L (ref 22–32)
Calcium: 8.6 mg/dL — ABNORMAL LOW (ref 8.9–10.3)
Calcium: 8.5 mg/dL — ABNORMAL LOW (ref 8.9–10.3)
Chloride: 94 mmol/L — ABNORMAL LOW (ref 98–111)
Chloride: 93 mmol/L — ABNORMAL LOW (ref 98–111)
Creatinine, Ser: 0.75 mg/dL (ref 0.44–1.00)
Creatinine, Ser: 0.67 mg/dL (ref 0.44–1.00)
GFR, Estimated: >60 mL/min (ref >60)
GFR, Estimated: >60 mL/min (ref >60)
Glucose, Bld: 103 mg/dL — ABNORMAL HIGH (ref 70–99)
Glucose, Bld: 109 mg/dL — ABNORMAL HIGH (ref 70–99)
Potassium: 3.5 mmol/L (ref 3.5–5.1)
Potassium: 3.3 mmol/L — ABNORMAL LOW (ref 3.5–5.1)
Sodium: 126 mmol/L — ABNORMAL LOW (ref 135–145)
Sodium: 124 mmol/L — ABNORMAL LOW (ref 135–145)

## 2023-12-04 LAB — OSMOLALITY, URINE: Osmolality, Ur: 631 mosm/kg (ref 300–900)

## 2023-12-04 LAB — OSMOLALITY: Osmolality: 269 mosm/kg — ABNORMAL LOW (ref 275–295)

## 2023-12-04 MED ORDER — LIDOCAINE 5 % EX PTCH
1.0000 | MEDICATED_PATCH | CUTANEOUS | Status: DC
  Administered 2023-12-04: 1 via TRANSDERMAL
  Filled 2023-12-04: qty 1

## 2023-12-26 ENCOUNTER — Inpatient Hospital Stay: Payer: BC Managed Care – PPO | Admitting: Family

## 2023-12-26 ENCOUNTER — Inpatient Hospital Stay: Payer: BC Managed Care – PPO | Attending: Hematology & Oncology

## 2023-12-26 ENCOUNTER — Other Ambulatory Visit: Payer: Self-pay | Admitting: Family

## 2023-12-26 DIAGNOSIS — D509 Iron deficiency anemia, unspecified: Secondary | ICD-10-CM

## 2023-12-30 ENCOUNTER — Encounter: Payer: BC Managed Care – PPO | Admitting: Family

## 2023-12-30 ENCOUNTER — Inpatient Hospital Stay: Payer: BC Managed Care – PPO

## 2024-01-06 ENCOUNTER — Inpatient Hospital Stay: Payer: BC Managed Care – PPO

## 2024-01-06 ENCOUNTER — Inpatient Hospital Stay: Payer: BC Managed Care – PPO | Admitting: Family

## 2024-02-05 ENCOUNTER — Ambulatory Visit: Payer: BC Managed Care – PPO | Admitting: Family Medicine

## 2024-03-21 ENCOUNTER — Emergency Department (HOSPITAL_BASED_OUTPATIENT_CLINIC_OR_DEPARTMENT_OTHER)

## 2024-03-21 ENCOUNTER — Emergency Department (HOSPITAL_BASED_OUTPATIENT_CLINIC_OR_DEPARTMENT_OTHER)
Admission: EM | Admit: 2024-03-21 | Discharge: 2024-03-21 | Disposition: A | Discharge status: Home / Self Care | Attending: Emergency Medicine | Admitting: Emergency Medicine

## 2024-03-21 ENCOUNTER — Emergency Department (HOSPITAL_BASED_OUTPATIENT_CLINIC_OR_DEPARTMENT_OTHER): Admitting: Radiology

## 2024-03-21 ENCOUNTER — Encounter (HOSPITAL_BASED_OUTPATIENT_CLINIC_OR_DEPARTMENT_OTHER): Payer: Self-pay

## 2024-03-21 ENCOUNTER — Other Ambulatory Visit: Payer: Self-pay

## 2024-03-21 DIAGNOSIS — R509 Fever, unspecified: Secondary | ICD-10-CM | POA: Diagnosis not present

## 2024-03-21 DIAGNOSIS — F1290 Cannabis use, unspecified, uncomplicated: Secondary | ICD-10-CM | POA: Diagnosis not present

## 2024-03-21 DIAGNOSIS — R112 Nausea with vomiting, unspecified: Secondary | ICD-10-CM | POA: Diagnosis not present

## 2024-03-21 DIAGNOSIS — F419 Anxiety disorder, unspecified: Secondary | ICD-10-CM | POA: Insufficient documentation

## 2024-03-21 DIAGNOSIS — Z79899 Other long term (current) drug therapy: Secondary | ICD-10-CM | POA: Diagnosis not present

## 2024-03-21 DIAGNOSIS — R000 Tachycardia, unspecified: Secondary | ICD-10-CM | POA: Diagnosis not present

## 2024-03-21 DIAGNOSIS — D72829 Elevated white blood cell count, unspecified: Secondary | ICD-10-CM | POA: Insufficient documentation

## 2024-03-21 DIAGNOSIS — K449 Diaphragmatic hernia without obstruction or gangrene: Secondary | ICD-10-CM | POA: Diagnosis not present

## 2024-03-21 DIAGNOSIS — R5081 Fever presenting with conditions classified elsewhere: Secondary | ICD-10-CM

## 2024-03-21 DIAGNOSIS — D259 Leiomyoma of uterus, unspecified: Secondary | ICD-10-CM | POA: Diagnosis not present

## 2024-03-21 DIAGNOSIS — R1013 Epigastric pain: Secondary | ICD-10-CM | POA: Diagnosis not present

## 2024-03-21 DIAGNOSIS — F17210 Nicotine dependence, cigarettes, uncomplicated: Secondary | ICD-10-CM | POA: Diagnosis not present

## 2024-03-21 DIAGNOSIS — A419 Sepsis, unspecified organism: Secondary | ICD-10-CM | POA: Diagnosis not present

## 2024-03-21 LAB — COMPREHENSIVE METABOLIC PANEL WITH GFR
ALT: 21 U/L (ref 0–44)
AST: 27 U/L (ref 15–41)
Albumin: 5.3 g/dL — ABNORMAL HIGH (ref 3.5–5.0)
Alkaline Phosphatase: 86 U/L (ref 38–126)
Anion gap: 20 — ABNORMAL HIGH (ref 5–15)
BUN: 8 mg/dL (ref 8–23)
CO2: 19 mmol/L — ABNORMAL LOW (ref 22–32)
Calcium: 11 mg/dL — ABNORMAL HIGH (ref 8.9–10.3)
Chloride: 98 mmol/L (ref 98–111)
Creatinine, Ser: 0.81 mg/dL (ref 0.44–1.00)
GFR, Estimated: >60 mL/min (ref >60)
Glucose, Bld: 158 mg/dL — ABNORMAL HIGH (ref 70–99)
Potassium: 3.9 mmol/L (ref 3.5–5.1)
Sodium: 138 mmol/L (ref 135–145)
Total Bilirubin: 0.5 mg/dL (ref 0.0–1.2)
Total Protein: 7.9 g/dL (ref 6.5–8.1)

## 2024-03-21 LAB — URINALYSIS, ROUTINE W REFLEX MICROSCOPIC
Bacteria, UA: NONE SEEN
Bilirubin Urine: NEGATIVE
Glucose, UA: NEGATIVE mg/dL
Hgb urine dipstick: NEGATIVE
Ketones, ur: 40 mg/dL — AB
Leukocytes,Ua: NEGATIVE
Nitrite: NEGATIVE
Protein, ur: NEGATIVE mg/dL
Specific Gravity, Urine: 1.012 (ref 1.005–1.030)
pH: 7.5 (ref 5.0–8.0)

## 2024-03-21 LAB — BASIC METABOLIC PANEL WITH GFR
Anion gap: 20 — ABNORMAL HIGH (ref 5–15)
BUN: 7 mg/dL — ABNORMAL LOW (ref 8–23)
CO2: 20 mmol/L — ABNORMAL LOW (ref 22–32)
Calcium: 10.1 mg/dL (ref 8.9–10.3)
Chloride: 100 mmol/L (ref 98–111)
Creatinine, Ser: 0.74 mg/dL (ref 0.44–1.00)
GFR, Estimated: >60 mL/min (ref >60)
Glucose, Bld: 145 mg/dL — ABNORMAL HIGH (ref 70–99)
Potassium: 3.2 mmol/L — ABNORMAL LOW (ref 3.5–5.1)
Sodium: 140 mmol/L (ref 135–145)

## 2024-03-21 LAB — CBC
HCT: 42.5 % (ref 36.0–46.0)
Hemoglobin: 15.3 g/dL — ABNORMAL HIGH (ref 12.0–15.0)
MCH: 29.7 pg (ref 26.0–34.0)
MCHC: 36 g/dL (ref 30.0–36.0)
MCV: 82.4 fL (ref 80.0–100.0)
Platelets: 420 10*3/uL — ABNORMAL HIGH (ref 150–400)
RBC: 5.16 MIL/uL — ABNORMAL HIGH (ref 3.87–5.11)
RDW: 14.3 % (ref 11.5–15.5)
WBC: 11.3 10*3/uL — ABNORMAL HIGH (ref 4.0–10.5)
nRBC: 0 % (ref 0.0–0.2)

## 2024-03-21 LAB — RESP PANEL BY RT-PCR (RSV, FLU A&B, COVID)  RVPGX2
Influenza A by PCR: NEGATIVE
Influenza B by PCR: NEGATIVE
Resp Syncytial Virus by PCR: NEGATIVE
SARS Coronavirus 2 by RT PCR: NEGATIVE

## 2024-03-21 LAB — LIPASE, BLOOD: Lipase: 13 U/L (ref 11–51)

## 2024-03-21 LAB — MAGNESIUM: Magnesium: 1.9 mg/dL (ref 1.7–2.4)

## 2024-03-21 MED ORDER — HYDROMORPHONE HCL 1 MG/ML IJ SOLN
0.5000 mg | Freq: Once | INTRAMUSCULAR | Status: AC
  Administered 2024-03-21: 0.5 mg via INTRAMUSCULAR
  Filled 2024-03-21: qty 1

## 2024-03-21 MED ORDER — IOHEXOL 300 MG/ML  SOLN
100.0000 mL | Freq: Once | INTRAMUSCULAR | Status: AC | PRN
  Administered 2024-03-21: 100 mL via INTRAVENOUS

## 2024-03-21 MED ORDER — SODIUM CHLORIDE 0.9 % IV BOLUS
1000.0000 mL | Freq: Once | INTRAVENOUS | Status: AC
  Administered 2024-03-21: 1000 mL via INTRAVENOUS

## 2024-03-21 MED ORDER — PANTOPRAZOLE SODIUM 20 MG PO TBEC: 20.0000 mg | DELAYED_RELEASE_TABLET | Freq: Every day | ORAL | 0 refills | Status: DC

## 2024-03-21 MED ORDER — ACETAMINOPHEN 500 MG PO TABS
1000.0000 mg | ORAL_TABLET | Freq: Once | ORAL | Status: AC
Start: 2024-03-21 — End: 2024-03-21
  Administered 2024-03-21: 1000 mg via ORAL
  Filled 2024-03-21: qty 2

## 2024-03-21 MED ORDER — PROMETHAZINE HCL 25 MG RE SUPP: 25.0000 mg | Freq: Four times a day (QID) | RECTAL | 0 refills | Status: AC | PRN

## 2024-03-21 MED ORDER — HYDROMORPHONE HCL 1 MG/ML IJ SOLN
0.5000 mg | Freq: Once | INTRAMUSCULAR | Status: AC
  Administered 2024-03-21: 0.5 mg via INTRAVENOUS
  Filled 2024-03-21: qty 1

## 2024-03-21 MED ORDER — DROPERIDOL 2.5 MG/ML IJ SOLN
1.2500 mg | Freq: Once | INTRAMUSCULAR | Status: AC
  Administered 2024-03-21: 1.25 mg via INTRAVENOUS
  Filled 2024-03-21: qty 2

## 2024-03-21 MED ORDER — FAMOTIDINE IN NACL 20-0.9 MG/50ML-% IV SOLN
20.0000 mg | Freq: Once | INTRAVENOUS | Status: AC
  Administered 2024-03-21: 20 mg via INTRAVENOUS
  Filled 2024-03-21: qty 50

## 2024-03-21 MED ORDER — ONDANSETRON 4 MG PO TBDP: 4.0000 mg | ORAL_TABLET | Freq: Three times a day (TID) | ORAL | 0 refills | Status: DC | PRN

## 2024-03-21 NOTE — ED Notes (Signed)
 Pt unable to give urine sample when just asked.

## 2024-03-21 NOTE — ED Provider Notes (Signed)
 [place] [facility] AT [facility] Provider Note   CSN: [id number] Arrival date & time: 03/21/24  1559     History  Chief Complaint  Patient presents with   Abdominal Pain   Emesis    Katie Chang is a 63 y.o. female.   Abdominal Pain Associated symptoms: vomiting   Emesis Associated symptoms: abdominal pain     63 year old female presents emergency department with complaints of pain in upper middle abdomen, nausea, vomiting.  States that symptoms began around 9 AM this morning.  Has had multiple rounds of vomiting and intolerance of p.o.  Has been seen in the emergency department multiple times for similar presentation and diagnosed with hyperemesis cannabinoid syndrome.  Patient reports continued marijuana use.  Denies any fevers, chills, urinary symptoms, change in bowel habits.  Denies any hematemesis, coffee-ground emesis.  Past medical history significant for cyclical vomiting syndrome, hyperemesis cannabinoid syndrome, hypertension, hyperlipidemia, opioid dependence, degenerative lumbar spinal stenosis, cervical disc herniation  Home Medications Prior to Admission medications   Medication Sig Start Date End Date Taking? Authorizing Provider  ondansetron  (ZOFRAN -ODT) 4 MG disintegrating tablet Take 1 tablet (4 mg total) by mouth every 8 (eight) hours as needed. 03/21/24  Yes Neil Balls A, PA  pantoprazole  (PROTONIX ) 20 MG tablet Take 1 tablet (20 mg total) by mouth daily. 03/21/24  Yes Neil Balls A, PA  promethazine  (PHENERGAN ) 25 MG suppository Place 1 suppository (25 mg total) rectally every 6 (six) hours as needed for nausea or vomiting. 03/21/24  Yes [place] Butter, PA  albuterol  (PROAIR  HFA) 108 (90 BASE) MCG/ACT inhaler Inhale 2 puffs into the lungs every 6 (six) hours as needed for wheezing. 03/11/13   Roark Chick, PA-C  amLODipine  (NORVASC ) 5 MG tablet Take 5 mg by mouth daily. 03/29/22   [provider]  celecoxib (CELEBREX) 200 MG  capsule Take 200 mg by mouth daily. 05/23/22   [provider]  cholecalciferol  (VITAMIN D3) 25 MCG (1000 UT) tablet Take 1,000 Units by mouth daily.    [provider]  esomeprazole (NEXIUM) 40 MG capsule Take 40 mg by mouth daily. 03/04/18   [provider]  fluticasone (FLONASE) 50 MCG/ACT nasal spray Place 2 sprays into both nostrils daily as needed for allergies.    [provider]  levocetirizine (XYZAL ) 5 MG tablet Take 0.5 tablets (2.5 mg total) by mouth every evening. Patient taking differently: Take 5 mg by mouth every evening. 03/03/23   Verlyn Goad, MD  LORazepam  (ATIVAN ) 1 MG tablet Take 1 mg by mouth at bedtime. 04/09/18   [provider]  magnesium  hydroxide (MILK OF MAGNESIA) 400 MG/5ML suspension Take 30 mLs by mouth at bedtime.    [provider]  metoprolol  succinate (TOPROL -XL) 25 MG 24 hr tablet Take 25 mg by mouth daily. 04/12/22   [provider]  montelukast  (SINGULAIR ) 10 MG tablet TAKE ONE TABLET AT BEDTIME. Patient taking differently: Take 10 mg by mouth every morning. 04/22/14   Valere Gata, MD  ondansetron  (ZOFRAN ) 4 MG tablet Take 1 tablet (4 mg total) by mouth every 6 (six) hours as needed for nausea or vomiting. 12/01/23   Adel Aden, PA-C  potassium chloride  SA (KLOR-CON  M) 20 MEQ tablet Take 1 tablet (20 mEq total) by mouth 2 (two) times daily. 12/01/23   Adel Aden, PA-C  rosuvastatin  (CRESTOR ) 10 MG tablet Take 10 mg by mouth See admin instructions. Take 10mg  by mouth Monday thru Friday. 03/29/22  [provider]  valsartan (DIOVAN) 80 MG tablet Take 80 mg by mouth at bedtime. 03/07/22   [provider]  venlafaxine  (EFFEXOR ) 37.5 MG tablet Take 112.5 mg by mouth daily. 07/31/22   [provider]      Allergies    Avelox [moxifloxacin hcl in nacl] and Haldol  [haloperidol  lactate]    Review of Systems   Review of Systems  Gastrointestinal:  Positive for  abdominal pain and vomiting.  All other systems reviewed and are negative.   Physical Exam Updated Vital Signs BP (!) 165/72   Pulse 83   Temp 99.7 F (37.6 C) (Oral)   Resp 13   SpO2 100%  Physical Exam Vitals and nursing note reviewed.  Constitutional:      Appearance: She is well-developed.  HENT:     Head: Normocephalic and atraumatic.  Eyes:     Conjunctiva/sclera: Conjunctivae normal.  Cardiovascular:     Rate and Rhythm: Normal rate and regular rhythm.     Heart sounds: No murmur heard. Pulmonary:     Effort: Pulmonary effort is normal. No respiratory distress.     Breath sounds: Normal breath sounds.  Abdominal:     Palpations: Abdomen is soft.     Tenderness: There is abdominal tenderness in the epigastric area. There is no guarding. Negative signs include Murphy's sign and McBurney's sign.  Musculoskeletal:        General: No swelling.     Cervical back: Neck supple.  Skin:    General: Skin is warm and dry.     Capillary Refill: Capillary refill takes less than 2 seconds.  Neurological:     Mental Status: She is alert.  Psychiatric:        Mood and Affect: Mood is anxious. Affect is tearful.        Speech: Speech is rapid and pressured.     ED Results / Procedures / Treatments   Labs (all labs ordered are listed, but only abnormal results are displayed) Labs Reviewed  COMPREHENSIVE METABOLIC PANEL WITH GFR - Abnormal; Notable for the following components:      Result Value   CO2 19 (*)    Glucose, Bld 158 (*)    Calcium  11.0 (*)    Albumin 5.3 (*)    Anion gap 20 (*)    All other components within normal limits  CBC - Abnormal; Notable for the following components:   WBC 11.3 (*)    RBC 5.16 (*)    Hemoglobin 15.3 (*)    Platelets 420 (*)    All other components within normal limits  LIPASE, BLOOD  MAGNESIUM   URINALYSIS, ROUTINE W REFLEX MICROSCOPIC  BASIC METABOLIC PANEL WITH GFR    EKG None  Radiology No results  found.  Procedures Procedures    Medications Ordered in ED Medications  sodium chloride  0.9 % bolus 1,000 mL (1,000 mLs Intravenous New Bag/Given 03/21/24 1738)  droperidol  (INAPSINE ) 2.5 MG/ML injection 1.25 mg (1.25 mg Intravenous Given 03/21/24 1721)  famotidine  (PEPCID ) IVPB 20 mg premix (0 mg Intravenous Stopped 03/21/24 1810)  HYDROmorphone  (DILAUDID ) injection 0.5 mg (0.5 mg Intravenous Given 03/21/24 1720)    ED Course/ Medical Decision Making/ A&P Clinical Course as of 03/21/24 1844  Sun Mar 21, 2024  1825 Reevaluation of the patient showed patient sleeping on her left side.  Patient well-appearing in no acute distress, in no apparent pain.  States she is feeling much better.  Will let IV fluids run for the  next 15 to 20 minutes until the completion.  Will trial p.o. and repeat metabolic panel. [CR]    Clinical Course User Index [CR] Ransom Butter, PA                                 Medical Decision Making Amount and/or Complexity of Data Reviewed Labs: ordered.  Risk Prescription drug management.   This patient presents to the ED for concern of abdominal pain, nausea, vomiting, this involves an extensive number of treatment options, and is a complaint that carries with it a high risk of complications and morbidity.  The differential diagnosis includes gastritis, PUD cholecystitis, CBD pathology, SBO/LBO, volvulus, diverticulitis, appendicitis, hyperemesis cannabinoid syndrome, opiate withdrawal, other   Co morbidities that complicate the patient evaluation  See HPI   Additional history obtained:  Additional history obtained from EMR External records from outside source obtained and reviewed including hospital records   Lab Tests:  I Ordered, and personally interpreted labs.  The pertinent results include: Leukocytosis of 11.3.  Polycythemia hemoglobin 15.3.  Thrombocytosis 420.  Decrease in bicarb of 19 otherwise, hypercalcemia of 11 otherwise, S within limits.   No transaminitis.  No renal dysfunction.  Lipase within normal limits.  Repeat BMP pending.   Imaging Studies ordered:  N/a   Cardiac Monitoring: / EKG:  The patient was maintained on a cardiac monitor.  I personally viewed and interpreted the cardiac monitored which showed an underlying rhythm of: Sinus tachycardia.  Probable LVH.  QTc 480.   Consultations Obtained:  N/a   Problem List / ED Course / Critical interventions / Medication management  Epigastric abdominal pain, nausea, vomiting I ordered medication including droperidol , hydromorphone , Pepcid , normal saline   Reevaluation of the patient after these medicines showed that the patient improved I have reviewed the patients home medicines and have made adjustments as needed   Social Determinants of Health:  Chronic cigarette use.  Denies illicit drug use.   Test / Admission - Considered:  Epigastric abdominal pain, nausea, vomiting Vitals signs significant for initial tachycardia with a heart rate of 106 of which improved with time elapsed medicines administered on the emergency department to 87. Otherwise within normal range and stable throughout visit. Laboratory/imaging studies significant for: See above 63 year old female presents emergency department with complaints of pain in upper middle abdomen, nausea, vomiting.  States that symptoms began around 9 AM this morning.  Has had multiple rounds of vomiting and intolerance of p.o.  Has been seen in the emergency department multiple times for similar presentation and diagnosed with hyperemesis cannabinoid syndrome.  Patient reports continued marijuana use.  Denies any fevers, chills, urinary symptoms, change in bowel habits.  Denies any hematemesis, coffee-ground emesis. On exam, reproducible tenderness in epigastric region.  Labs concerning for dehydration including decrease in bicarb, elevation anion gap, leukocytosis/polycythemia/thrombocytosis.  Treated with  medication as well as IV fluids on the emergency department did note significant improvement of symptoms subsequent tolerance of p.o.  At time of shift change, awaiting repeat BMP for assessment of patient's electrolytes/anion gap.  If improved, will discharge.  Patient currently tolerating p.o. at this time.  Patient care handed off to PA-C Small at shift change.  Patient stable at shift change.         Final Clinical Impression(s) / ED Diagnoses Final diagnoses:  Uses marijuana  Nausea and vomiting, unspecified vomiting type  Epigastric abdominal pain  Rx / DC Orders ED Discharge Orders          Ordered    promethazine  (PHENERGAN ) 25 MG suppository  Every 6 hours PRN        03/21/24 1822    ondansetron  (ZOFRAN -ODT) 4 MG disintegrating tablet  Every 8 hours PRN        03/21/24 1822    pantoprazole  (PROTONIX ) 20 MG tablet  Daily        03/21/24 1822              [place] Butter, [place] 03/21/24 1844    Merdis Stalling, MD 03/21/24 2317

## 2024-03-21 NOTE — ED Provider Notes (Signed)
 Patient, hx of cyclic vomiting, repeat BMP, PO trial. Is much more comfortable. Otherwise medically cleared.  Physical Exam  BP (!) 165/72   Pulse 83   Temp 99.7 F (37.6 C) (Oral)   Resp 13   SpO2 100%   Physical Exam Vitals and nursing note reviewed.  Constitutional:      General: She is not in acute distress.    Appearance: She is well-developed.  HENT:     Head: Normocephalic and atraumatic.  Eyes:     Conjunctiva/sclera: Conjunctivae normal.  Cardiovascular:     Rate and Rhythm: Normal rate and regular rhythm.     Heart sounds: No murmur heard. Pulmonary:     Effort: Pulmonary effort is normal. No respiratory distress.     Breath sounds: Normal breath sounds.  Abdominal:     Palpations: Abdomen is soft.     Tenderness: There is no abdominal tenderness.  Musculoskeletal:        General: No swelling.     Cervical back: Neck supple.  Skin:    General: Skin is warm and dry.     Capillary Refill: Capillary refill takes less than 2 seconds.  Neurological:     Mental Status: She is alert.  Psychiatric:        Mood and Affect: Mood normal.     Procedures  Procedures  ED Course / MDM   Clinical Course as of 03/21/24 1841  Sun Mar 21, 2024  1825 Reevaluation of the patient showed patient sleeping on her left side.  Patient well-appearing in no acute distress, in no apparent pain.  States she is feeling much better.  Will let IV fluids run for the next 15 to 20 minutes until the completion.  Will trial p.o. and repeat metabolic panel. [CR]    Clinical Course User Index [CR] [place] Butter, PA   Medical Decision Making Handed off from [place], [place], patient having nausea, vomiting epigastric discomfort, no shortness of breath or chest discomfort.  He is overall well-appearing, required repeat eval, patient spiked a fever, of 102 degrees, was having some nausea, vomiting, abdominal pain.  CT abdomen pelvis obtained, which is unremarkable, chest x-ray obtained  which is unremarkable as well, COVID and flu negative.  Is also that she has some type of gastroenteritis, which may be causing this, anion gap is not much improved, but creatinine is improved, likely that her symptoms likely will take some time to resolve, I discussed with the patient, staying, versus going home, she like to go home, with nausea medicine, she successfully passed a p.o. trial, she has no blood in her stool, and her abdomen is soft overall.  I instructed her on strict return precautions, and she was discharged home  Amount and/or Complexity of Data Reviewed Labs: ordered. Radiology: ordered.  Risk OTC drugs. Prescription drug management.         Timmy Forbes, [place] 03/21/24 2354    Merdis Stalling, MD 03/22/24 1420

## 2024-03-21 NOTE — ED Triage Notes (Signed)
 Pt reports vomiting since 9a; associated abd pain "at the base of my sternum- the whole thing." Reports zofran  approx 2hrs ago, no relief.   Hx cyclic vomiting syndrome

## 2024-03-21 NOTE — Discharge Instructions (Addendum)
 As discussed, suspect that this is most likely due to continued marijuana use vs gastroenteritis.  Please stop using marijuana/THC containing products as this is most likely causing your recurrent visits to the emergency department.  Will send you home with nausea medicine to use as needed as well as medicine for your abdominal discomfort.  Recommend follow-up with GI specialist in the outpatient setting for reassessment.  Please do not hesitate to return to emergency department if the worrisome signs and symptoms we discussed become apparent.

## 2024-03-23 ENCOUNTER — Other Ambulatory Visit: Payer: Self-pay

## 2024-03-23 ENCOUNTER — Encounter (HOSPITAL_BASED_OUTPATIENT_CLINIC_OR_DEPARTMENT_OTHER): Payer: Self-pay | Admitting: Emergency Medicine

## 2024-03-23 ENCOUNTER — Observation Stay (HOSPITAL_BASED_OUTPATIENT_CLINIC_OR_DEPARTMENT_OTHER)
Admission: EM | Admit: 2024-03-23 | Discharge: 2024-03-25 | Disposition: A | Discharge status: Home / Self Care | Attending: Internal Medicine | Admitting: Internal Medicine

## 2024-03-23 DIAGNOSIS — M5020 Other cervical disc displacement, unspecified cervical region: Secondary | ICD-10-CM | POA: Insufficient documentation

## 2024-03-23 DIAGNOSIS — F32A Depression, unspecified: Secondary | ICD-10-CM | POA: Diagnosis not present

## 2024-03-23 DIAGNOSIS — E66811 Obesity, class 1: Secondary | ICD-10-CM | POA: Insufficient documentation

## 2024-03-23 DIAGNOSIS — E871 Hypo-osmolality and hyponatremia: Secondary | ICD-10-CM | POA: Insufficient documentation

## 2024-03-23 DIAGNOSIS — E785 Hyperlipidemia, unspecified: Secondary | ICD-10-CM | POA: Insufficient documentation

## 2024-03-23 DIAGNOSIS — E876 Hypokalemia: Principal | ICD-10-CM | POA: Insufficient documentation

## 2024-03-23 DIAGNOSIS — Z720 Tobacco use: Secondary | ICD-10-CM | POA: Diagnosis present

## 2024-03-23 DIAGNOSIS — I10 Essential (primary) hypertension: Secondary | ICD-10-CM | POA: Diagnosis not present

## 2024-03-23 DIAGNOSIS — F17210 Nicotine dependence, cigarettes, uncomplicated: Secondary | ICD-10-CM | POA: Diagnosis not present

## 2024-03-23 DIAGNOSIS — M47816 Spondylosis without myelopathy or radiculopathy, lumbar region: Secondary | ICD-10-CM | POA: Diagnosis present

## 2024-03-23 DIAGNOSIS — F1290 Cannabis use, unspecified, uncomplicated: Secondary | ICD-10-CM | POA: Insufficient documentation

## 2024-03-23 DIAGNOSIS — Z79899 Other long term (current) drug therapy: Secondary | ICD-10-CM | POA: Insufficient documentation

## 2024-03-23 DIAGNOSIS — M5126 Other intervertebral disc displacement, lumbar region: Secondary | ICD-10-CM | POA: Diagnosis not present

## 2024-03-23 DIAGNOSIS — M48061 Spinal stenosis, lumbar region without neurogenic claudication: Secondary | ICD-10-CM | POA: Insufficient documentation

## 2024-03-23 DIAGNOSIS — M47896 Other spondylosis, lumbar region: Secondary | ICD-10-CM | POA: Diagnosis not present

## 2024-03-23 DIAGNOSIS — R1084 Generalized abdominal pain: Secondary | ICD-10-CM | POA: Diagnosis not present

## 2024-03-23 DIAGNOSIS — Z6831 Body mass index (BMI) 31.0-31.9, adult: Secondary | ICD-10-CM | POA: Insufficient documentation

## 2024-03-23 DIAGNOSIS — R109 Unspecified abdominal pain: Secondary | ICD-10-CM

## 2024-03-23 DIAGNOSIS — R112 Nausea with vomiting, unspecified: Principal | ICD-10-CM | POA: Insufficient documentation

## 2024-03-23 LAB — COMPREHENSIVE METABOLIC PANEL WITH GFR
ALT: 25 U/L (ref 0–44)
AST: 47 U/L — ABNORMAL HIGH (ref 15–41)
Albumin: 4.9 g/dL (ref 3.5–5.0)
Alkaline Phosphatase: 73 U/L (ref 38–126)
Anion gap: 21 — ABNORMAL HIGH (ref 5–15)
BUN: 9 mg/dL (ref 8–23)
CO2: 18 mmol/L — ABNORMAL LOW (ref 22–32)
Calcium: 10.1 mg/dL (ref 8.9–10.3)
Chloride: 88 mmol/L — ABNORMAL LOW (ref 98–111)
Creatinine, Ser: 0.92 mg/dL (ref 0.44–1.00)
GFR, Estimated: >60 mL/min (ref >60)
Glucose, Bld: 139 mg/dL — ABNORMAL HIGH (ref 70–99)
Potassium: 2.5 mmol/L — CL (ref 3.5–5.1)
Sodium: 127 mmol/L — ABNORMAL LOW (ref 135–145)
Total Bilirubin: 0.9 mg/dL (ref 0.0–1.2)
Total Protein: 7.2 g/dL (ref 6.5–8.1)

## 2024-03-23 LAB — URINALYSIS, ROUTINE W REFLEX MICROSCOPIC
Bilirubin Urine: NEGATIVE
Glucose, UA: NEGATIVE mg/dL
Hgb urine dipstick: NEGATIVE
Ketones, ur: 40 mg/dL — AB
Nitrite: NEGATIVE
Specific Gravity, Urine: 1.013 (ref 1.005–1.030)
pH: 7 (ref 5.0–8.0)

## 2024-03-23 LAB — CBC WITH DIFFERENTIAL/PLATELET
Abs Immature Granulocytes: 0.08 10*3/uL — ABNORMAL HIGH (ref 0.00–0.07)
Basophils Absolute: 0 10*3/uL (ref 0.0–0.1)
Basophils Relative: 0 %
Eosinophils Absolute: 0.1 10*3/uL (ref 0.0–0.5)
Eosinophils Relative: 0 %
HCT: 40.6 % (ref 36.0–46.0)
Hemoglobin: 15.2 g/dL — ABNORMAL HIGH (ref 12.0–15.0)
Immature Granulocytes: 1 %
Lymphocytes Relative: 25 %
Lymphs Abs: 3.9 10*3/uL (ref 0.7–4.0)
MCH: 29.9 pg (ref 26.0–34.0)
MCHC: 37.4 g/dL — ABNORMAL HIGH (ref 30.0–36.0)
MCV: 79.8 fL — ABNORMAL LOW (ref 80.0–100.0)
Monocytes Absolute: 1.2 10*3/uL — ABNORMAL HIGH (ref 0.1–1.0)
Monocytes Relative: 8 %
Neutro Abs: 10.2 10*3/uL — ABNORMAL HIGH (ref 1.7–7.7)
Neutrophils Relative %: 66 %
Platelets: 409 10*3/uL — ABNORMAL HIGH (ref 150–400)
RBC: 5.09 MIL/uL (ref 3.87–5.11)
RDW: 14 % (ref 11.5–15.5)
WBC: 15.4 10*3/uL — ABNORMAL HIGH (ref 4.0–10.5)
nRBC: 0 % (ref 0.0–0.2)

## 2024-03-23 LAB — URINE DRUG SCREEN
Amphetamines: NOT DETECTED
Barbiturates: NOT DETECTED
Benzodiazepines: DETECTED — AB
Cocaine: NOT DETECTED
Fentanyl: NOT DETECTED
Methadone Scn, Ur: NOT DETECTED
Opiates: DETECTED — AB
Tetrahydrocannabinol: DETECTED — AB

## 2024-03-23 LAB — POTASSIUM: Potassium: 2 mmol/L — CL (ref 3.5–5.1)

## 2024-03-23 LAB — MAGNESIUM: Magnesium: 1.7 mg/dL (ref 1.7–2.4)

## 2024-03-23 MED ORDER — LIDOCAINE 5 % EX PTCH
1.0000 | MEDICATED_PATCH | CUTANEOUS | Status: DC
  Administered 2024-03-23 – 2024-03-24 (×2): 1 via TRANSDERMAL
  Filled 2024-03-23 (×2): qty 1

## 2024-03-23 MED ORDER — ENOXAPARIN SODIUM 40 MG/0.4ML IJ SOSY
40.0000 mg | PREFILLED_SYRINGE | INTRAMUSCULAR | Status: DC
  Administered 2024-03-23 – 2024-03-24 (×2): 40 mg via SUBCUTANEOUS
  Filled 2024-03-23 (×2): qty 0.4

## 2024-03-23 MED ORDER — LORAZEPAM 1 MG PO TABS
1.0000 mg | ORAL_TABLET | Freq: Every day | ORAL | Status: DC
  Administered 2024-03-23 – 2024-03-24 (×2): 1 mg via ORAL
  Filled 2024-03-23 (×2): qty 1

## 2024-03-23 MED ORDER — POTASSIUM CHLORIDE 2 MEQ/ML IV SOLN
INTRAVENOUS | Status: DC
  Filled 2024-03-23 (×2): qty 1000

## 2024-03-23 MED ORDER — LACTATED RINGERS IV BOLUS
1000.0000 mL | Freq: Once | INTRAVENOUS | Status: AC
  Administered 2024-03-23: 1000 mL via INTRAVENOUS

## 2024-03-23 MED ORDER — DROPERIDOL 2.5 MG/ML IJ SOLN
1.2500 mg | Freq: Once | INTRAMUSCULAR | Status: AC
  Administered 2024-03-23: 1.25 mg via INTRAVENOUS
  Filled 2024-03-23: qty 2

## 2024-03-23 MED ORDER — NICOTINE 14 MG/24HR TD PT24
14.0000 mg | MEDICATED_PATCH | Freq: Every day | TRANSDERMAL | Status: DC
  Administered 2024-03-23 – 2024-03-25 (×3): 14 mg via TRANSDERMAL
  Filled 2024-03-23 (×3): qty 1

## 2024-03-23 MED ORDER — MORPHINE SULFATE (PF) 2 MG/ML IV SOLN
2.0000 mg | Freq: Four times a day (QID) | INTRAVENOUS | Status: DC | PRN
  Administered 2024-03-23: 2 mg via INTRAVENOUS
  Filled 2024-03-23: qty 1

## 2024-03-23 MED ORDER — ALBUTEROL SULFATE (2.5 MG/3ML) 0.083% IN NEBU: 2.5000 mg | INHALATION_SOLUTION | Freq: Four times a day (QID) | RESPIRATORY_TRACT | Status: DC | PRN

## 2024-03-23 MED ORDER — POTASSIUM CHLORIDE CRYS ER 20 MEQ PO TBCR
20.0000 meq | EXTENDED_RELEASE_TABLET | Freq: Once | ORAL | Status: AC
  Administered 2024-03-23: 20 meq via ORAL
  Filled 2024-03-23: qty 1

## 2024-03-23 MED ORDER — METOCLOPRAMIDE HCL 5 MG/ML IJ SOLN
10.0000 mg | Freq: Once | INTRAMUSCULAR | Status: AC
  Administered 2024-03-23: 10 mg via INTRAVENOUS
  Filled 2024-03-23: qty 2

## 2024-03-23 MED ORDER — POTASSIUM CHLORIDE 10 MEQ/100ML IV SOLN
10.0000 meq | INTRAVENOUS | Status: AC
  Administered 2024-03-23 – 2024-03-24 (×3): 10 meq via INTRAVENOUS
  Filled 2024-03-23 (×3): qty 100

## 2024-03-23 MED ORDER — POTASSIUM CHLORIDE CRYS ER 20 MEQ PO TBCR
20.0000 meq | EXTENDED_RELEASE_TABLET | Freq: Two times a day (BID) | ORAL | Status: DC
  Administered 2024-03-24 – 2024-03-25 (×3): 20 meq via ORAL
  Filled 2024-03-23 (×3): qty 1

## 2024-03-23 MED ORDER — PANTOPRAZOLE SODIUM 40 MG IV SOLR
40.0000 mg | Freq: Every day | INTRAVENOUS | Status: DC
  Administered 2024-03-23 – 2024-03-24 (×2): 40 mg via INTRAVENOUS
  Filled 2024-03-23 (×2): qty 10

## 2024-03-23 MED ORDER — DICYCLOMINE HCL 10 MG/ML IM SOLN
20.0000 mg | Freq: Once | INTRAMUSCULAR | Status: AC
  Administered 2024-03-23: 20 mg via INTRAMUSCULAR
  Filled 2024-03-23: qty 2

## 2024-03-23 MED ORDER — POTASSIUM CHLORIDE 10 MEQ/100ML IV SOLN
10.0000 meq | Freq: Once | INTRAVENOUS | Status: AC
  Administered 2024-03-23: 10 meq via INTRAVENOUS
  Filled 2024-03-23: qty 100

## 2024-03-23 MED ORDER — MAGNESIUM SULFATE 2 GM/50ML IV SOLN
2.0000 g | Freq: Once | INTRAVENOUS | Status: AC
  Administered 2024-03-23: 2 g via INTRAVENOUS
  Filled 2024-03-23: qty 50

## 2024-03-23 MED ORDER — SENNOSIDES-DOCUSATE SODIUM 8.6-50 MG PO TABS: 1.0000 | ORAL_TABLET | Freq: Every evening | ORAL | Status: DC | PRN

## 2024-03-23 MED ORDER — HYDRALAZINE HCL 20 MG/ML IJ SOLN: 5.0000 mg | Freq: Three times a day (TID) | INTRAMUSCULAR | Status: DC | PRN

## 2024-03-23 MED ORDER — ROSUVASTATIN CALCIUM 10 MG PO TABS
10.0000 mg | ORAL_TABLET | ORAL | Status: DC
Start: 2024-03-24 — End: 2024-03-25
  Administered 2024-03-24 – 2024-03-25 (×2): 10 mg via ORAL
  Filled 2024-03-23 (×2): qty 1

## 2024-03-23 MED ORDER — LORATADINE 10 MG PO TABS
5.0000 mg | ORAL_TABLET | Freq: Every evening | ORAL | Status: DC
  Administered 2024-03-24: 5 mg via ORAL
  Filled 2024-03-23: qty 1

## 2024-03-23 MED ORDER — POTASSIUM CHLORIDE 10 MEQ/50ML IV SOLN: 10.0000 meq | INTRAVENOUS | Status: DC

## 2024-03-23 MED ORDER — METOPROLOL SUCCINATE ER 25 MG PO TB24
25.0000 mg | ORAL_TABLET | Freq: Every day | ORAL | Status: DC
  Administered 2024-03-24 – 2024-03-25 (×2): 25 mg via ORAL
  Filled 2024-03-23 (×2): qty 1

## 2024-03-23 MED ORDER — LEVOCETIRIZINE DIHYDROCHLORIDE 5 MG PO TABS: 2.5000 mg | ORAL_TABLET | Freq: Every evening | ORAL | Status: DC

## 2024-03-23 MED ORDER — PROMETHAZINE HCL 25 MG/ML IJ SOLN
INTRAMUSCULAR | Status: AC
Start: 2024-03-23 — End: 2024-03-23
  Filled 2024-03-23: qty 1

## 2024-03-23 MED ORDER — ALBUTEROL SULFATE HFA 108 (90 BASE) MCG/ACT IN AERS: 2.0000 | INHALATION_SPRAY | Freq: Four times a day (QID) | RESPIRATORY_TRACT | Status: DC | PRN

## 2024-03-23 MED ORDER — SODIUM CHLORIDE 0.9 % IV SOLN
25.0000 mg | Freq: Once | INTRAVENOUS | Status: AC
  Administered 2024-03-23: 25 mg via INTRAVENOUS
  Filled 2024-03-23: qty 1

## 2024-03-23 MED ORDER — MONTELUKAST SODIUM 10 MG PO TABS
10.0000 mg | ORAL_TABLET | Freq: Every day | ORAL | Status: DC
  Administered 2024-03-23 – 2024-03-24 (×2): 10 mg via ORAL
  Filled 2024-03-23 (×2): qty 1

## 2024-03-23 MED ORDER — IRBESARTAN 150 MG PO TABS
75.0000 mg | ORAL_TABLET | Freq: Every day | ORAL | Status: DC
  Administered 2024-03-24 – 2024-03-25 (×2): 75 mg via ORAL
  Filled 2024-03-23 (×2): qty 1

## 2024-03-23 MED ORDER — AMLODIPINE BESYLATE 10 MG PO TABS
5.0000 mg | ORAL_TABLET | Freq: Every day | ORAL | Status: DC
  Administered 2024-03-24 – 2024-03-25 (×2): 5 mg via ORAL
  Filled 2024-03-23 (×2): qty 1

## 2024-03-23 MED ORDER — FLUTICASONE PROPIONATE 50 MCG/ACT NA SUSP
2.0000 | Freq: Every day | NASAL | Status: DC | PRN
  Administered 2024-03-24: 2 via NASAL
  Filled 2024-03-23: qty 16

## 2024-03-23 MED ORDER — MORPHINE SULFATE (PF) 4 MG/ML IV SOLN
4.0000 mg | Freq: Once | INTRAVENOUS | Status: AC
  Administered 2024-03-23: 4 mg via INTRAVENOUS
  Filled 2024-03-23: qty 1

## 2024-03-23 MED ORDER — VITAMIN D 25 MCG (1000 UNIT) PO TABS
1000.0000 [IU] | ORAL_TABLET | Freq: Every day | ORAL | Status: DC
Start: 2024-03-23 — End: 2024-03-25
  Administered 2024-03-24 – 2024-03-25 (×2): 1000 [IU] via ORAL
  Filled 2024-03-23 (×2): qty 1

## 2024-03-23 MED ORDER — PROCHLORPERAZINE EDISYLATE 10 MG/2ML IJ SOLN
10.0000 mg | Freq: Four times a day (QID) | INTRAMUSCULAR | Status: DC | PRN
  Administered 2024-03-23: 10 mg via INTRAVENOUS
  Filled 2024-03-23: qty 2

## 2024-03-23 NOTE — ED Provider Notes (Signed)
  Physical Exam  BP (!) 147/87 (BP Location: Left Arm)   Pulse (!) 104   Temp 98.5 F (36.9 C) (Oral)   Resp (!) 30   SpO2 99%   Physical Exam Vitals and nursing note reviewed.  HENT:     Head: Normocephalic and atraumatic.  Eyes:     Pupils: Pupils are equal, round, and reactive to light.  Cardiovascular:     Rate and Rhythm: Normal rate and regular rhythm.  Pulmonary:     Effort: Pulmonary effort is normal.     Breath sounds: Normal breath sounds.  Abdominal:     Palpations: Abdomen is soft.     Tenderness: There is no abdominal tenderness.  Skin:    General: Skin is warm and dry.  Neurological:     Mental Status: She is alert.  Psychiatric:        Mood and Affect: Mood normal.     Procedures  Procedures  ED Course / MDM   Clinical Course as of 03/23/24 1707  Tue Mar 23, 2024  1706 Unfortunately patient is not feeling any better after ED interventions of IV fluids, potassium, droperidol , Reglan  Phenergan  Bentyl  morphine .  She has been here for over 6 hours with no relief despite all these interventions.  Will need to admit for hypokalemia and uncontrolled abdominal pain nausea vomiting. [MP]    Clinical Course User Index [MP] Sallyanne Creamer, DO   Medical Decision Making I, Rafael Bun DO, have assumed care of this patient from the previous provider pending reevaluation and disposition  Amount and/or Complexity of Data Reviewed Labs: ordered.  Risk Prescription drug management.          Sallyanne Creamer, DO 03/24/24 [phone]

## 2024-03-23 NOTE — ED Notes (Signed)
 Pt left dept w/ [facility]

## 2024-03-23 NOTE — ED Provider Notes (Signed)
 [place] [facility]TH [facility]LO[facility] UROLOGY Provider Note  CSN: [id number] Arrival date & time: 03/23/24 1055  Chief Complaint(s) Abdominal Pain  HPI [name] is a 63 y.o. female who is here today for abdominal pain and vomiting.  Patient has been having symptoms ongoing since Sunday.  Was seen here on Sunday, had labs, imaging done that was negative.  Was discharged with symptomatic improvement.  Patient with history of cannabinoid hyperemesis syndrome.  Patient recently used marijuana.   Past Medical History Past Medical History:  Diagnosis Date   Allergy    Arthritis    Asthma    Cervical dysplasia    Cyclic vomiting syndrome    DDD (degenerative disc disease), cervical    Cervical and Lumbar   Depression    Eczema    GERD (gastroesophageal reflux disease)    Hypertension    Menopausal symptoms    Migraines    PONV (postoperative nausea and vomiting)    sometimes nausea   Recurrent sinus infections    Patient Active Problem List   Diagnosis Date Noted   Intractable cyclical vomiting with nausea 03/01/2023   Hematemesis 03/01/2023   Hyperlipidemia 03/01/2023   Essential hypertension 03/01/2023   Tobacco abuse 03/01/2023   CAP (community acquired pneumonia) 03/01/2023   Anxiety 12/15/2022   Cannabis use disorder 12/15/2022   Cervical disc herniation 12/15/2022   Lumbar spondylosis 12/15/2022   Lumbar herniated disc 12/15/2022   Intractable nausea and vomiting 05/30/2022   Hypomagnesemia 05/30/2022   Hyponatremia 05/30/2022   Prolonged QT interval 05/30/2022   Hypocalcemia 05/30/2022   Opioid dependence (HCC) 07/19/2019   Degenerative lumbar spinal stenosis 06/16/2019   Hypokalemia 02/09/2018   Possible Cyclic vomiting syndrome 02/09/2018   Dehydration 02/09/2018   Depression    Asthma    Eczema    Cervical dysplasia    Menopausal symptoms    Migraines    Home Medication(s) Prior to Admission medications   Medication Sig Start Date End Date  Taking? Authorizing Provider  metoprolol  succinate (TOPROL -XL) 25 MG 24 hr tablet Take 25 mg by mouth daily. 04/12/22  Yes [provider]  albuterol  (PROAIR  HFA) 108 (90 BASE) MCG/ACT inhaler Inhale 2 puffs into the lungs every 6 (six) hours as needed for wheezing. 03/11/13   Roark Chick, PA-C  amLODipine  (NORVASC ) 5 MG tablet Take 5 mg by mouth daily. 03/29/22   [provider]  celecoxib (CELEBREX) 200 MG capsule Take 200 mg by mouth daily. 05/23/22   [provider]  cholecalciferol  (VITAMIN D3) 25 MCG (1000 UT) tablet Take 1,000 Units by mouth daily.    [provider]  esomeprazole (NEXIUM) 40 MG capsule Take 40 mg by mouth daily. 03/04/18   [provider]  fluticasone (FLONASE) 50 MCG/ACT nasal spray Place 2 sprays into both nostrils daily as needed for allergies.    [provider]  levocetirizine (XYZAL ) 5 MG tablet Take 0.5 tablets (2.5 mg total) by mouth every evening. Patient taking differently: Take 5 mg by mouth every evening. 03/03/23   Verlyn Goad, MD  LORazepam  (ATIVAN ) 1 MG tablet Take 1 mg by mouth at bedtime. 04/09/18   [provider]  magnesium  hydroxide (MILK OF MAGNESIA) 400 MG/5ML suspension Take 30 mLs by mouth at bedtime.    [provider]  montelukast  (SINGULAIR ) 10 MG tablet TAKE ONE TABLET AT BEDTIME. Patient taking differently: Take 10 mg by mouth every morning. 04/22/14   Hun, Kristi M, MD  ondansetron  (  ZOFRAN ) 4 MG tablet Take 1 tablet (4 mg total) by mouth every 6 (six) hours as needed for nausea or vomiting. 12/01/23   Adel Aden, PA-C  ondansetron  (ZOFRAN -ODT) 4 MG disintegrating tablet Take 1 tablet (4 mg total) by mouth every 8 (eight) hours as needed. 03/21/24   [place] Butter, PA  pantoprazole  (PROTONIX ) 20 MG tablet Take 1 tablet (20 mg total) by mouth daily. 03/21/24   [place] Butter, PA  potassium chloride  SA (KLOR-CON  M) 20 MEQ tablet Take 1 tablet (20 mEq total) by mouth 2  (two) times daily. 12/01/23   Adel Aden, PA-C  promethazine  (PHENERGAN ) 25 MG suppository Place 1 suppository (25 mg total) rectally every 6 (six) hours as needed for nausea or vomiting. 03/21/24   [place] Butter, PA  rosuvastatin  (CRESTOR ) 10 MG tablet Take 10 mg by mouth See admin instructions. Take 10mg  by mouth Monday thru Friday. 03/29/22   [provider]  valsartan (DIOVAN) 80 MG tablet Take 80 mg by mouth at bedtime. 03/07/22   [provider]  venlafaxine  (EFFEXOR ) 37.5 MG tablet Take 112.5 mg by mouth daily. 07/31/22   [provider]                                                                                                                                    Past Surgical History Past Surgical History:  Procedure Laterality Date   BIOPSY  03/03/2023   Procedure: BIOPSY;  Surgeon: Felecia Hopper, MD;  Location: [facility];  Service: Gastroenterology;;   CERVICAL BIOPSY     Dr. Jinger Mount   COLONOSCOPY W/ POLYPECTOMY     COLPOSCOPY     ESOPHAGOGASTRODUODENOSCOPY (EGD) WITH PROPOFOL  N/A 05/13/2016   Procedure: ESOPHAGOGASTRODUODENOSCOPY (EGD) WITH PROPOFOL ;  Surgeon: Garrett Kallman, MD;  Location: [facility];  Service: Endoscopy;  Laterality: N/A;   ESOPHAGOGASTRODUODENOSCOPY (EGD) WITH PROPOFOL  N/A 03/03/2023   Procedure: ESOPHAGOGASTRODUODENOSCOPY (EGD) WITH PROPOFOL ;  Surgeon: Felecia Hopper, MD;  Location: [facility];  Service: Gastroenterology;  Laterality: N/A;   KNEE SURGERY     NASAL SINUS SURGERY  1992   NECK SURGERY     TONSILLECTOMY  1990   Family History Family History  Problem Relation Age of Onset   Heart disease Mother    Hypertension Father    Diabetes Paternal Grandfather    Heart disease Paternal Grandfather    Stroke Maternal Grandmother    Stroke Maternal Grandfather    Breast cancer Maternal Aunt        Age 60's    Social History Social History   Tobacco Use   Smoking status: Every Day    Current  packs/day: 1.50    Average packs/day: 1.5 packs/day for 10.0 years (15.0 ttl pk-yrs)    Types: Cigarettes   Smokeless tobacco: Never  Vaping Use   Vaping status: Some Days  Substance Use Topics   Alcohol use: No  Comment: rare   Drug use: Yes    Types: Marijuana    Comment: yes- will refrain x24 hours   Allergies Avelox [moxifloxacin hcl in nacl] and Haldol  [haloperidol  lactate]  Review of Systems Review of Systems  Physical Exam Vital Signs  I have reviewed the triage vital signs BP 134/80 (BP Location: Left Arm)   Pulse 88   Temp 97.7 F (36.5 C) (Oral)   Resp 20   SpO2 100%   Physical Exam Vitals reviewed.  Cardiovascular:     Rate and Rhythm: Normal rate.  Pulmonary:     Effort: Pulmonary effort is normal.  Abdominal:     General: Abdomen is flat.     Tenderness: There is no abdominal tenderness.     Hernia: No hernia is present.  Neurological:     Mental Status: She is alert.     ED Results and Treatments Labs (all labs ordered are listed, but only abnormal results are displayed) Labs Reviewed  CBC WITH DIFFERENTIAL/PLATELET - Abnormal; Notable for the following components:      Result Value   WBC 15.4 (*)    Hemoglobin 15.2 (*)    MCV 79.8 (*)    MCHC 37.4 (*)    Platelets 409 (*)    Neutro Abs 10.2 (*)    Monocytes Absolute 1.2 (*)    Abs Immature Granulocytes 0.08 (*)    All other components within normal limits  COMPREHENSIVE METABOLIC PANEL WITH GFR - Abnormal; Notable for the following components:   Sodium 127 (*)    Potassium 2.5 (*)    Chloride 88 (*)    CO2 18 (*)    Glucose, Bld 139 (*)    AST 47 (*)    Anion gap 21 (*)    All other components within normal limits  URINE DRUG SCREEN - Abnormal; Notable for the following components:   Opiates DETECTED (*)    Benzodiazepines DETECTED (*)    Tetrahydrocannabinol DETECTED (*)    All other components within normal limits  URINALYSIS, ROUTINE W REFLEX MICROSCOPIC - Abnormal; Notable  for the following components:   APPearance HAZY (*)    Ketones, ur 40 (*)    Protein, ur TRACE (*)    Leukocytes,Ua SMALL (*)    Bacteria, UA RARE (*)    All other components within normal limits  BASIC METABOLIC PANEL WITH GFR - Abnormal; Notable for the following components:   Sodium 127 (*)    Potassium 2.6 (*)    Chloride 95 (*)    CO2 21 (*)    Glucose, Bld 108 (*)    BUN 5 (*)    Calcium  8.3 (*)    All other components within normal limits  CBC - Abnormal; Notable for the following components:   WBC 14.0 (*)    All other components within normal limits  POTASSIUM - Abnormal; Notable for the following components:   Potassium 2.0 (*)    All other components within normal limits  MAGNESIUM   HIV ANTIBODY (ROUTINE TESTING W REFLEX)  MAGNESIUM   Radiology No results found.  Pertinent labs & imaging results that were available during my care of the patient were reviewed by me and considered in my medical decision making (see MDM for details).  Medications Ordered in ED Medications  promethazine  (PHENERGAN ) 25 MG/ML injection (  Not Given 03/23/24 1151)  lidocaine  (LIDODERM ) 5 % 1 patch (1 patch Transdermal Patch Removed 03/24/24 0211)  amLODipine  (NORVASC ) tablet 5 mg (has no administration in time range)  metoprolol  succinate (TOPROL -XL) 24 hr tablet 25 mg (has no administration in time range)  rosuvastatin  (CRESTOR ) tablet 10 mg (has no administration in time range)  irbesartan  (AVAPRO ) tablet 75 mg (has no administration in time range)  LORazepam  (ATIVAN ) tablet 1 mg (1 mg Oral Given 03/23/24 2125)  cholecalciferol  (VITAMIN D3) 25 MCG (1000 UNIT) tablet 1,000 Units (has no administration in time range)  potassium chloride  SA (KLOR-CON  M) CR tablet 20 mEq (has no administration in time range)  fluticasone (FLONASE) 50 MCG/ACT nasal spray 2 spray (2 sprays Each  Nare Given 03/24/24 0649)  montelukast  (SINGULAIR ) tablet 10 mg (10 mg Oral Given 03/23/24 2126)  enoxaparin  (LOVENOX ) injection 40 mg (40 mg Subcutaneous Given 03/23/24 2124)  lactated ringers  1,000 mL with potassium chloride  20 mEq infusion ( Intravenous New Bag/Given 03/23/24 2228)  morphine  (PF) 2 MG/ML injection 2 mg (2 mg Intravenous Given 03/23/24 2125)  senna-docusate (Senokot-S) tablet 1 tablet (has no administration in time range)  hydrALAZINE  (APRESOLINE ) injection 5 mg (has no administration in time range)  nicotine  (NICODERM CQ  - dosed in mg/24 hours) patch 14 mg (14 mg Transdermal Patch Applied 03/23/24 2126)  pantoprazole  (PROTONIX ) injection 40 mg (40 mg Intravenous Given 03/23/24 2124)  prochlorperazine  (COMPAZINE ) injection 10 mg (10 mg Intravenous Given 03/23/24 2125)  albuterol  (PROVENTIL ) (2.5 MG/3ML) 0.083% nebulizer solution 2.5 mg (has no administration in time range)  loratadine  (CLARITIN ) tablet 5 mg (has no administration in time range)  promethazine  (PHENERGAN ) 25 mg in sodium chloride  0.9 % 50 mL IVPB (0 mg Intravenous Stopped 03/23/24 1205)  dicyclomine  (BENTYL ) injection 20 mg (20 mg Intramuscular Given 03/23/24 1200)  morphine  (PF) 4 MG/ML injection 4 mg (4 mg Intravenous Given 03/23/24 1200)  metoCLOPramide  (REGLAN ) injection 10 mg (10 mg Intravenous Given 03/23/24 1256)  lactated ringers  bolus 1,000 mL (0 mLs Intravenous Stopped 03/23/24 1458)  droperidol  (INAPSINE ) 2.5 MG/ML injection 1.25 mg (1.25 mg Intravenous Given 03/23/24 1337)  potassium chloride  10 mEq in 100 mL IVPB (0 mEq Intravenous Stopped 03/23/24 1458)  droperidol  (INAPSINE ) 2.5 MG/ML injection 1.25 mg (1.25 mg Intravenous Given 03/23/24 1454)  magnesium  sulfate IVPB 2 g 50 mL (2 g Intravenous New Bag/Given 03/23/24 1716)  potassium chloride  10 mEq in 100 mL IVPB (10 mEq Intravenous New Bag/Given 03/23/24 1752)  lactated ringers  bolus 1,000 mL (1,000 mLs Intravenous New Bag/Given 03/23/24 1751)  metoCLOPramide  (REGLAN ) injection 10  mg (10 mg Intravenous Given 03/23/24 2125)  potassium chloride  10 mEq in 100 mL IVPB (10 mEq Intravenous New Bag/Given 03/24/24 0036)  potassium chloride  SA (KLOR-CON  M) CR tablet 20 mEq (20 mEq Oral Given 03/23/24 2125)  potassium chloride  SA (KLOR-CON  M) CR tablet 40 mEq (40 mEq Oral Given 03/24/24 0649)  Procedures .Critical Care  Performed by: Nathanael Baker, DO Authorized by: Nathanael Baker, DO   Critical care provider statement:    Critical care time (minutes):  33   Critical care was necessary to treat or prevent imminent or life-threatening deterioration of the following conditions:  Metabolic crisis   Critical care was time spent personally by me on the following activities:  Development of treatment plan with patient or surrogate, discussions with consultants, evaluation of patient's response to treatment, examination of patient, ordering and review of laboratory studies, ordering and review of radiographic studies, ordering and performing treatments and interventions, pulse oximetry, re-evaluation of patient's condition and review of old charts   (including critical care time)  Medical Decision Making / ED Course   This patient presents to the ED for concern of abdominal pain and vomiting, this involves an extensive number of treatment options, and is a complaint that carries with it a high risk of complications and morbidity.  The differential diagnosis includes cannabinoid hyperemesis, gastroparesis, cyclic vomiting, less of acute intra-abdominal infection.  MDM: Patient with generalized abdominal tenderness.  Negative CT imaging done 2 days ago.  Do not believe patient requires repeat imaging at this time.  Will provide her with some antiemetics.  Patient does have an allergy listed to haloperidol , though it sounds like she had a bad dream while she  was on it.  Will try some droperidol .  Do not believe patient requires repeat CT imaging today.  Patient with a low potassium, will begin to replete.  Patient disposition will be determined by her response to treatment and ability to tolerate p.o.  Patient signed out to Dr. Ranelle Buys  Additional history obtained: -Additional history obtained from friend at bedside -External records from outside source obtained and reviewed including: Chart review including previous notes, labs, imaging, consultation notes   Lab Tests: -I ordered, reviewed, and interpreted labs.   The pertinent results include:   Labs Reviewed  CBC WITH DIFFERENTIAL/PLATELET - Abnormal; Notable for the following components:      Result Value   WBC 15.4 (*)    Hemoglobin 15.2 (*)    MCV 79.8 (*)    MCHC 37.4 (*)    Platelets 409 (*)    Neutro Abs 10.2 (*)    Monocytes Absolute 1.2 (*)    Abs Immature Granulocytes 0.08 (*)    All other components within normal limits  COMPREHENSIVE METABOLIC PANEL WITH GFR - Abnormal; Notable for the following components:   Sodium 127 (*)    Potassium 2.5 (*)    Chloride 88 (*)    CO2 18 (*)    Glucose, Bld 139 (*)    AST 47 (*)    Anion gap 21 (*)    All other components within normal limits  URINE DRUG SCREEN - Abnormal; Notable for the following components:   Opiates DETECTED (*)    Benzodiazepines DETECTED (*)    Tetrahydrocannabinol DETECTED (*)    All other components within normal limits  URINALYSIS, ROUTINE W REFLEX MICROSCOPIC - Abnormal; Notable for the following components:   APPearance HAZY (*)    Ketones, ur 40 (*)    Protein, ur TRACE (*)    Leukocytes,Ua SMALL (*)    Bacteria, UA RARE (*)    All other components within normal limits  BASIC METABOLIC PANEL WITH GFR - Abnormal; Notable for the following components:   Sodium 127 (*)    Potassium 2.6 (*)    Chloride 95 (*)  CO2 21 (*)    Glucose, Bld 108 (*)    BUN 5 (*)    Calcium  8.3 (*)    All other  components within normal limits  CBC - Abnormal; Notable for the following components:   WBC 14.0 (*)    All other components within normal limits  POTASSIUM - Abnormal; Notable for the following components:   Potassium 2.0 (*)    All other components within normal limits  MAGNESIUM   HIV ANTIBODY (ROUTINE TESTING W REFLEX)  MAGNESIUM            Medicines ordered and prescription drug management: Meds ordered this encounter  Medications   promethazine  (PHENERGAN ) 25 mg in sodium chloride  0.9 % 50 mL IVPB   promethazine  (PHENERGAN ) 25 MG/ML injection    Felicita Horns, Brandi R: cabinet override   dicyclomine  (BENTYL ) injection 20 mg   morphine  (PF) 4 MG/ML injection 4 mg   metoCLOPramide  (REGLAN ) injection 10 mg   lactated ringers  bolus 1,000 mL   droperidol  (INAPSINE ) 2.5 MG/ML injection 1.25 mg   potassium chloride  10 mEq in 100 mL IVPB   droperidol  (INAPSINE ) 2.5 MG/ML injection 1.25 mg   lidocaine  (LIDODERM ) 5 % 1 patch   magnesium  sulfate IVPB 2 g 50 mL   potassium chloride  10 mEq in 100 mL IVPB   lactated ringers  bolus 1,000 mL   amLODipine  (NORVASC ) tablet 5 mg   metoprolol  succinate (TOPROL -XL) 24 hr tablet 25 mg   rosuvastatin  (CRESTOR ) tablet 10 mg    Take 10mg  by mouth Monday thru Friday.     irbesartan  (AVAPRO ) tablet 75 mg   LORazepam  (ATIVAN ) tablet 1 mg   cholecalciferol  (VITAMIN D3) 25 MCG (1000 UNIT) tablet 1,000 Units   potassium chloride  SA (KLOR-CON  M) CR tablet 20 mEq   DISCONTD: albuterol  (VENTOLIN  HFA) 108 (90 Base) MCG/ACT inhaler 2 puff   fluticasone (FLONASE) 50 MCG/ACT nasal spray 2 spray   DISCONTD: levocetirizine (XYZAL ) tablet 2.5 mg   montelukast  (SINGULAIR ) tablet 10 mg   enoxaparin  (LOVENOX ) injection 40 mg   lactated ringers  1,000 mL with potassium chloride  20 mEq infusion   morphine  (PF) 2 MG/ML injection 2 mg   senna-docusate (Senokot-S) tablet 1 tablet   hydrALAZINE  (APRESOLINE ) injection 5 mg   nicotine  (NICODERM CQ  - dosed in mg/24  hours) patch 14 mg   pantoprazole  (PROTONIX ) injection 40 mg   prochlorperazine  (COMPAZINE ) injection 10 mg   DISCONTD: potassium chloride  10 mEq in 50 mL *CENTRAL LINE* IVPB   metoCLOPramide  (REGLAN ) injection 10 mg   potassium chloride  10 mEq in 100 mL IVPB   potassium chloride  SA (KLOR-CON  M) CR tablet 20 mEq   albuterol  (PROVENTIL ) (2.5 MG/3ML) 0.083% nebulizer solution 2.5 mg   loratadine  (CLARITIN ) tablet 5 mg   potassium chloride  SA (KLOR-CON  M) CR tablet 40 mEq    -I have reviewed the patients home medicines and have made adjustments as needed  Critical interventions hypokalemia    Cardiac Monitoring: The patient was maintained on a cardiac monitor.  I personally viewed and interpreted the cardiac monitored which showed an underlying rhythm of: sinus rhythm     Reevaluation: After the interventions noted above, I reevaluated the patient and found that they have :improved  Co morbidities that complicate the patient evaluation  Past Medical History:  Diagnosis Date   Allergy    Arthritis    Asthma    Cervical dysplasia    Cyclic vomiting syndrome    DDD (degenerative disc disease), cervical  Cervical and Lumbar   Depression    Eczema    GERD (gastroesophageal reflux disease)    Hypertension    Menopausal symptoms    Migraines    PONV (postoperative nausea and vomiting)    sometimes nausea   Recurrent sinus infections       Dispostion: Signed out to Dr Ranelle Buys     Final Clinical Impression(s) / ED Diagnoses Final diagnoses:  Hypokalemia  Abdominal pain, unspecified abdominal location  Nausea and vomiting, unspecified vomiting type     @PCDICTATION @    Afton Horse T, DO 03/24/24 [id number]

## 2024-03-23 NOTE — ED Triage Notes (Signed)
 C/o n/v since Sunday. Seen here for same. States symptoms have not improved. Tachypnea in triage. Very emotional .

## 2024-03-23 NOTE — H&P (Signed)
 History and Physical    Patient: Katie Chang ZOX:[id number] DOB: 1961-06-16 DOA: 03/23/2024 DOS: the patient was seen and examined on 03/23/2024 PCP: [name], NP  Patient coming from: Home  Chief Complaint:  Chief Complaint  Patient presents with   Abdominal Pain   HPI: Katie Chang is a 63 y.o. 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  She presents on this occasion with intractable nausea and vomiting for the past several days.  She was seen in the ED 2 days prior for similar symptoms but patient opted to be discharged home.  She returns to the ED due to intractable symptoms and unable to keep any food down.  She is anxious on this visit.  Vomitus is nonbilious, nonbloody.  She complains of epigastric pain that is nonradiating.  Review of records suggest CT abdomen and pelvis was done 2 days prior.  Unremarkable CT scan.   Review of Systems: As mentioned in the history of present illness. All other systems reviewed and are negative. Past Medical History:  Diagnosis Date   Allergy    Arthritis    Asthma    Cervical dysplasia    Cyclic vomiting syndrome    DDD (degenerative disc disease), cervical    Cervical and Lumbar   Depression    Eczema    GERD (gastroesophageal reflux disease)    Hypertension    Menopausal symptoms    Migraines    PONV (postoperative nausea and vomiting)    sometimes nausea   Recurrent sinus infections    Past Surgical History:  Procedure Laterality Date   BIOPSY  03/03/2023   Procedure: BIOPSY;  Surgeon: Felecia Hopper, MD;  Location: [facility];  Service: Gastroenterology;;   CERVICAL BIOPSY     Dr. Jinger Mount   COLONOSCOPY W/ POLYPECTOMY     COLPOSCOPY     ESOPHAGOGASTRODUODENOSCOPY (EGD) WITH PROPOFOL  N/A 05/13/2016    Procedure: ESOPHAGOGASTRODUODENOSCOPY (EGD) WITH PROPOFOL ;  Surgeon: Garrett Kallman, MD;  Location: [facility];  Service: Endoscopy;  Laterality: N/A;   ESOPHAGOGASTRODUODENOSCOPY (EGD) WITH PROPOFOL  N/A 03/03/2023   Procedure: ESOPHAGOGASTRODUODENOSCOPY (EGD) WITH PROPOFOL ;  Surgeon: Felecia Hopper, MD;  Location: [facility];  Service: Gastroenterology;  Laterality: N/A;   KNEE SURGERY     NASAL SINUS SURGERY  1992   NECK SURGERY     TONSILLECTOMY  1990   Social History:  reports that she has been smoking cigarettes. She has a 15 pack-year smoking history. She has never used smokeless tobacco. She reports current drug use. Drug: Marijuana. She reports that she does not drink alcohol.  Allergies  Allergen Reactions   Avelox [Moxifloxacin Hcl In Nacl] Hives    No allergic reaction to Levaquin.   Haldol  [Haloperidol  Lactate] Other (See Comments)    "Night terrors"    Family History  Problem Relation Age of Onset   Heart disease Mother    Hypertension Father    Diabetes Paternal Grandfather    Heart disease Paternal Grandfather    Stroke Maternal Grandmother    Stroke Maternal Grandfather    Breast cancer Maternal Aunt        Age 41's    Prior to Admission medications   Medication Sig Start Date End Date Taking? Authorizing Provider  metoprolol  succinate (TOPROL -XL) 25 MG 24 hr tablet Take 25 mg by mouth daily. 04/12/22  Yes [provider]  albuterol  (PROAIR  HFA) 108 (90 BASE) MCG/ACT inhaler Inhale 2 puffs into the lungs every 6 (six) hours as needed for wheezing. 03/11/13   Roark Chick, PA-C  amLODipine  (NORVASC ) 5 MG tablet Take 5 mg by mouth daily. 03/29/22   [provider]  celecoxib (CELEBREX) 200 MG capsule Take 200 mg by mouth daily. 05/23/22   [provider]  cholecalciferol  (VITAMIN D3) 25 MCG (1000 UT) tablet Take 1,000 Units by mouth daily.    [provider]  esomeprazole (NEXIUM) 40 MG capsule Take 40 mg by mouth daily.  03/04/18   [provider]  fluticasone (FLONASE) 50 MCG/ACT nasal spray Place 2 sprays into both nostrils daily as needed for allergies.    [provider]  levocetirizine (XYZAL ) 5 MG tablet Take 0.5 tablets (2.5 mg total) by mouth every evening. Patient taking differently: Take 5 mg by mouth every evening. 03/03/23   Verlyn Goad, MD  LORazepam  (ATIVAN ) 1 MG tablet Take 1 mg by mouth at bedtime. 04/09/18   [provider]  magnesium  hydroxide (MILK OF MAGNESIA) 400 MG/5ML suspension Take 30 mLs by mouth at bedtime.    [provider]  montelukast  (SINGULAIR ) 10 MG tablet TAKE ONE TABLET AT BEDTIME. Patient taking differently: Take 10 mg by mouth every morning. 04/22/14   Valere Gata, MD  ondansetron  (ZOFRAN ) 4 MG tablet Take 1 tablet (4 mg total) by mouth every 6 (six) hours as needed for nausea or vomiting. 12/01/23   Adel Aden, PA-C  ondansetron  (ZOFRAN -ODT) 4 MG disintegrating tablet Take 1 tablet (4 mg total) by mouth every 8 (eight) hours as needed. 03/21/24   Salmon Butter, PA  pantoprazole  (PROTONIX ) 20 MG tablet Take 1 tablet (20 mg total) by mouth daily. 03/21/24   [place] Butter, PA  potassium chloride  SA (KLOR-CON  M) 20 MEQ tablet Take 1 tablet (20 mEq total) by mouth 2 (two) times daily. 12/01/23   Adel Aden, PA-C  promethazine  (PHENERGAN ) 25 MG suppository Place 1 suppository (25 mg total) rectally every 6 (six) hours as needed for nausea or vomiting. 03/21/24   [place] Butter, PA  rosuvastatin  (CRESTOR ) 10 MG tablet Take 10 mg by mouth See admin instructions. Take 10mg  by mouth Monday thru Friday. 03/29/22   [provider]  valsartan (DIOVAN) 80 MG tablet Take 80 mg by mouth at bedtime. 03/07/22   [provider]  venlafaxine  (EFFEXOR ) 37.5 MG tablet Take 112.5 mg by mouth daily. 07/31/22   [provider]    Physical Exam: Vitals:   03/23/24 1102 03/23/24 1457 03/23/24 1709 03/23/24 1834  BP:  (!) 125/91 (!) 147/87 (!) 157/87 (!) 159/92  Pulse: (!) 125 (!) 104 (!) 122 94  Resp: 20 (!) 30 (!) 34 (!) 24  Temp: 98.6 F (37 C) 98.5 F (36.9 C) 98.5 F (36.9 C) 98.2 F (36.8 C)  TempSrc: Oral Oral Oral Oral  SpO2: 100% 99% 100% 100%   General: Patient is anxious appearing female who is hyperventilating.  She was crying. HEENT: Oral mucosa dry, head is atraumatic. Neck: Supple with no JVD Chest: Clinically clear with no added sounds.  No use of accessory muscles. Cardiovascular: Tachycardic with no appreciable murmur. CNS shows no focal deficits.  Cranial nerves grossly intact. Skin negative for any new rash. Gait could not be assessed at this time. Data Reviewed: Sodium was 127, potassium 2.5, chloride 88, bicarb 18, glucose 139, BUN 9, creatinine 0.92 lipase  was 13 on 05/04.  AST was 47, ALT 25, WBC 15.4, hemoglobin 15.2, hematocrit 40, MCV 79.8, platelet count 409 neutrophil count 66  CT scan: FINDINGS as noted below per radiology notes: Lower chest: Small hiatal hernia.  No acute findings.   Hepatobiliary: Diffuse low-density throughout the liver compatible with fatty infiltration. No focal abnormality. Gallbladder unremarkable.   Pancreas: No focal abnormality or ductal dilatation.   Spleen: No focal abnormality.  Normal size.   Adrenals/Urinary Tract: No adrenal abnormality. No focal renal abnormality. No stones or hydronephrosis. Urinary bladder is unremarkable.   Stomach/Bowel: Stomach, large and small bowel grossly unremarkable.   Vascular/Lymphatic: Diffuse aortic atherosclerosis. No evidence of aneurysm or adenopathy.   Reproductive: Calcified fibroid in the uterus.  No adnexal mass.   Other: No free fluid or free air.   Musculoskeletal: No acute bony abnormality. Postoperative and degenerative changes in the lumbar spine. Assessment and Plan:  63 year old female with history of cyclical vomiting, chronic THC use, admitted on account of intractable  nausea vomiting, failed outpatient treatment.  #1.  Cyclical nausea/vomiting most likely secondary to chronic THC use.  Differentials include cannabinoid hyperemesis syndrome.  Trial of antiemetics including Zofran , Compazine  and Reglan  will be offered.  Known history of QTc prolongation hence we will keep patient on telemetry.  #2.  Hyponatremia and hypokalemia most likely due to dehydration and poor oral intake.  Will replete electrolytes as appropriate.  Monitor serum sodium and potassium as appropriate.  #3.  Chronic THC use: Cessation counseling was again advised.  She uses THC for anxiety disorder.  Trial of Ativan  will be offered.  No known withdrawal symptoms.  #4.  Known history of gastritis.  Patient will be placed on IV PPI.  This time we will only initiate clear liquid diet as tolerated by patient.  #5.  History of QTc prolongation.  EKG from 5/04 showed QTc within normal limits.  Will repeat EKG tonight.  Patient be monitored on telemetry while is on antiemetics.  Caution advised with antiemetics.  #6.  Hypertension: Essential hypertension.  Will resume home medications as tolerated by patient.  IV hydralazine  as needed for SBP greater than 180 mmHg.  #7.  History of migraine headaches: Patient is on Effexor .  Note that Effexor  can potentially increase QTc prolongation.  Will monitor carefully on telemetry.    Advance Care Planning:   Code Status: Full Code   Consults: Gastroenterology  Family Communication:   Severity of Illness: The appropriate patient status for this patient is OBSERVATION. Observation status is judged to be reasonable and necessary in order to provide the required intensity of service to ensure the patient's safety. The patient's presenting symptoms, physical exam findings, and initial radiographic and laboratory data in the context of their medical condition is felt to place them at decreased risk for further clinical deterioration. Furthermore, it is  anticipated that the patient will be medically stable for discharge from the hospital within 2 midnights of admission.   Author: Theodora Fish, MD 03/23/2024 9:34 PM  For on call review [email or web address].

## 2024-03-24 DIAGNOSIS — R112 Nausea with vomiting, unspecified: Secondary | ICD-10-CM | POA: Diagnosis not present

## 2024-03-24 LAB — BASIC METABOLIC PANEL WITH GFR
Anion gap: 11 (ref 5–15)
Anion gap: 13 (ref 5–15)
BUN: 5 mg/dL — ABNORMAL LOW (ref 8–23)
BUN: <5 mg/dL — ABNORMAL LOW (ref 8–23)
CO2: 21 mmol/L — ABNORMAL LOW (ref 22–32)
CO2: 20 mmol/L — ABNORMAL LOW (ref 22–32)
Calcium: 8.3 mg/dL — ABNORMAL LOW (ref 8.9–10.3)
Calcium: 9.1 mg/dL (ref 8.9–10.3)
Chloride: 95 mmol/L — ABNORMAL LOW (ref 98–111)
Chloride: 98 mmol/L (ref 98–111)
Creatinine, Ser: 0.54 mg/dL (ref 0.44–1.00)
Creatinine, Ser: 0.47 mg/dL (ref 0.44–1.00)
GFR, Estimated: >60 mL/min (ref >60)
GFR, Estimated: >60 mL/min (ref >60)
Glucose, Bld: 108 mg/dL — ABNORMAL HIGH (ref 70–99)
Glucose, Bld: 132 mg/dL — ABNORMAL HIGH (ref 70–99)
Potassium: 2.6 mmol/L — CL (ref 3.5–5.1)
Potassium: 3.2 mmol/L — ABNORMAL LOW (ref 3.5–5.1)
Sodium: 127 mmol/L — ABNORMAL LOW (ref 135–145)
Sodium: 131 mmol/L — ABNORMAL LOW (ref 135–145)

## 2024-03-24 LAB — CBC
HCT: 37.8 % (ref 36.0–46.0)
Hemoglobin: 13.2 g/dL (ref 12.0–15.0)
MCH: 29.7 pg (ref 26.0–34.0)
MCHC: 34.9 g/dL (ref 30.0–36.0)
MCV: 85.1 fL (ref 80.0–100.0)
Platelets: 328 10*3/uL (ref 150–400)
RBC: 4.44 MIL/uL (ref 3.87–5.11)
RDW: 14.5 % (ref 11.5–15.5)
WBC: 14 10*3/uL — ABNORMAL HIGH (ref 4.0–10.5)
nRBC: 0 % (ref 0.0–0.2)

## 2024-03-24 LAB — MAGNESIUM: Magnesium: 2.5 mg/dL — ABNORMAL HIGH (ref 1.7–2.4)

## 2024-03-24 LAB — HIV ANTIBODY (ROUTINE TESTING W REFLEX): HIV Screen 4th Generation wRfx: NONREACTIVE

## 2024-03-24 MED ORDER — VENLAFAXINE HCL 75 MG PO TABS
112.5000 mg | ORAL_TABLET | Freq: Every day | ORAL | Status: DC
  Administered 2024-03-24 – 2024-03-25 (×2): 112.5 mg via ORAL
  Filled 2024-03-24 (×2): qty 2

## 2024-03-24 MED ORDER — POTASSIUM CHLORIDE CRYS ER 20 MEQ PO TBCR
40.0000 meq | EXTENDED_RELEASE_TABLET | Freq: Once | ORAL | Status: AC
  Administered 2024-03-24: 40 meq via ORAL
  Filled 2024-03-24: qty 2

## 2024-03-24 NOTE — Progress Notes (Signed)
 [facility] Progress Note  Patient: Katie Chang     ZOX:[id number]  DOA: 03/23/2024   PCP: [name], NP       Brief hospital course: 63 year old female with chronic marijuana use, cyclical vomiting, hypertension, hyperlipidemia, GERD and QT prolongation who has had hospital admissions for cannabis hyperemesis syndrome in the past. She presented to the hospital with intractable nausea and vomiting. Temperature was 102 degrees when she was evaluated in the ER on 5/4 and the patient was told that she may have a GI virus.  GI symptoms did not resolve.  Subjective:  Nausea and vomiting have resolved.  She tolerated full liquids for lunch today.  She did feel lightheaded when she ambulated this morning.  She had 2 normal bowel movements today.  Assessment and Plan: Principal Problem:   Intractable nausea and vomiting -Possibly viral at this time as she was febrile and had leukocytosis - Appears to be tolerating full liquids and I will advance to solid food for this evening   Electrolyte abnormalities - Sodium dropped from 140-127 - Potassium dropped from 3.9-2.5 and was 2.6 this morning - Likely secondary to her underlying GI issues that she has been dealing with-replacing and rechecking  Hypertension - Continue amlodipine , metoprolol  and valsartan  Chronic cannabis use with reported cannabis hyperemesis syndrome    Code Status: Full Code Total time on patient care: 30 minutes DVT prophylaxis:  enoxaparin  (LOVENOX ) injection 40 mg Start: 03/23/24 2200 SCDs Start: 03/23/24 2056     Objective:   Vitals:   03/23/24 2255 03/24/24 0331 03/24/24 0604 03/24/24 1238  BP: 135/79 131/84 134/80 (!) 149/87  Pulse: (!) 102 86 88 87  Resp: 20 20 20 20   Temp: 99.8 F (37.7 C) 98.7 F (37.1 C) 97.7 F (36.5 C) 97.6 F (36.4 C)  TempSrc: Oral Oral Oral Oral  SpO2: 97% 100% 100% 100%   There were no vitals filed for this visit. Exam: General exam: Appears comfortable   HEENT: oral mucosa moist Respiratory system: Clear to auscultation.  Cardiovascular system: S1 & S2 heard  Gastrointestinal system: Abdomen soft, mild epigastric tenderness is present, nondistended. Normal bowel sounds   Extremities: No cyanosis, clubbing or edema Psychiatry:  Mood & affect appropriate.   CBC: Recent Labs  Lab 03/21/24 1616 03/23/24 1123 03/24/24 0403  WBC 11.3* 15.4* 14.0*  NEUTROABS  --  10.2*  --   HGB 15.3* 15.2* 13.2  HCT 42.5 40.6 37.8  MCV 82.4 79.8* 85.1  PLT 420* 409* 328   Basic Metabolic Panel: Recent Labs  Lab 03/21/24 1610 03/21/24 1616 03/21/24 1922 03/23/24 1123 03/23/24 1400 03/23/24 2127 03/24/24 0356 03/24/24 0403  NA  --  138 140 127*  --   --   --  127*  K  --  3.9 3.2* 2.5*  --  2.0*  --  2.6*  CL  --  98 100 88*  --   --   --  95*  CO2  --  19* 20* 18*  --   --   --  21*  GLUCOSE  --  158* 145* 139*  --   --   --  108*  BUN  --  8 7* 9  --   --   --  5*  CREATININE  --  0.81 0.74 0.92  --   --   --  0.54  CALCIUM   --  11.0* 10.1 10.1  --   --   --  8.3*  MG  1.9  --   --   --  1.7  --  2.5*  --      Scheduled Meds:  amLODipine   5 mg Oral Daily   cholecalciferol   1,000 Units Oral Daily   enoxaparin  (LOVENOX ) injection  40 mg Subcutaneous Q24H   irbesartan   75 mg Oral Daily   lidocaine   1 patch Transdermal Q24H   loratadine   5 mg Oral QPM   LORazepam   1 mg Oral QHS   metoprolol  succinate  25 mg Oral Daily   montelukast   10 mg Oral QHS   nicotine   14 mg Transdermal Daily   pantoprazole  (PROTONIX ) IV  40 mg Intravenous QHS   potassium chloride  SA  20 mEq Oral BID   rosuvastatin   10 mg Oral Once per day on Monday Tuesday Wednesday Thursday Friday    Imaging and lab data personally reviewed   Author: Laiklyn Pilkenton  03/24/2024 2:10 PM  To contact [facility]>   Check the care team in [facility] and look for the attending/consulting [facility] provider listed  Log into [email or web address] and use [facility]'s universal password   Go  to> "[facility]"  and find provider  If you still have difficulty reaching the provider, please page the [facility] (Director on Call) for the Hospitalists listed on amion

## 2024-03-24 NOTE — Plan of Care (Signed)
  Problem: Education: Goal: Knowledge of General Education information will improve Description: Including pain rating scale, medication(s)/side effects and non-pharmacologic comfort measures Outcome: Progressing   Problem: Clinical Measurements: Goal: Diagnostic test results will improve Outcome: Progressing   Problem: Nutrition: Goal: Adequate nutrition will be maintained Outcome: Progressing   Problem: Coping: Goal: Level of anxiety will decrease Outcome: Progressing   Problem: Pain Managment: Goal: General experience of comfort will improve and/or be controlled Outcome: Progressing

## 2024-03-24 NOTE — Progress Notes (Signed)
 Mobility Specialist - Progress Note   03/24/24 1000  Mobility  Activity Ambulated with assistance in hallway  Level of Assistance Modified independent, requires aide device or extra time  Assistive Device None  Distance Ambulated (ft) 350 ft  Range of Motion/Exercises Active  Activity Response Tolerated well  Mobility Referral Yes  Mobility visit 1 Mobility  Mobility Specialist Start Time (ACUTE ONLY) [id number]  Mobility Specialist Stop Time (ACUTE ONLY) 0931  Mobility Specialist Time Calculation (min) (ACUTE ONLY) 8 min   Received in chair and agreed to mobility. Had c/o of dizziness, claimed it was due to being hungry. Returned to chair to finish her soup with all needs met.  Katie Chang Mobility Specialist

## 2024-03-24 NOTE — Progress Notes (Signed)
   03/24/24 1321  TOC Brief Assessment  Insurance and Status Reviewed  Patient has primary care physician Yes  Home environment has been reviewed Resides at home alone  Prior level of function: Independent with ADLs at baseline  Prior/Current Home Services No current home services  Social Drivers of Health Review SDOH reviewed no interventions necessary  Readmission risk has been reviewed Yes  Transition of care needs no transition of care needs at this time

## 2024-03-25 DIAGNOSIS — R112 Nausea with vomiting, unspecified: Secondary | ICD-10-CM | POA: Diagnosis not present

## 2024-03-25 LAB — BASIC METABOLIC PANEL WITH GFR
Anion gap: 9 (ref 5–15)
BUN: 7 mg/dL — ABNORMAL LOW (ref 8–23)
CO2: 21 mmol/L — ABNORMAL LOW (ref 22–32)
Calcium: 8.8 mg/dL — ABNORMAL LOW (ref 8.9–10.3)
Chloride: 101 mmol/L (ref 98–111)
Creatinine, Ser: 0.4 mg/dL — ABNORMAL LOW (ref 0.44–1.00)
GFR, Estimated: >60 mL/min (ref >60)
Glucose, Bld: 101 mg/dL — ABNORMAL HIGH (ref 70–99)
Potassium: 3.9 mmol/L (ref 3.5–5.1)
Sodium: 131 mmol/L — ABNORMAL LOW (ref 135–145)

## 2024-03-25 MED ORDER — NICOTINE 7 MG/24HR TD PT24: 7.0000 mg | MEDICATED_PATCH | TRANSDERMAL | 0 refills | Status: AC

## 2024-03-25 MED ORDER — NICOTINE 14 MG/24HR TD PT24: 14.0000 mg | MEDICATED_PATCH | Freq: Every day | TRANSDERMAL | 0 refills | Status: AC

## 2024-03-25 MED ORDER — LIDOCAINE 5 % EX PTCH: MEDICATED_PATCH | CUTANEOUS | 0 refills | Status: AC

## 2024-03-25 MED ORDER — PANTOPRAZOLE SODIUM 40 MG PO TBEC: 40.0000 mg | DELAYED_RELEASE_TABLET | Freq: Every day | ORAL | Status: DC

## 2024-03-25 NOTE — Progress Notes (Signed)
 IVs removed, dressed, belongings packed, instructions reviewed with understanding verbalized, transferred to front entrance via wheelchair.

## 2024-03-25 NOTE — Plan of Care (Signed)
  Problem: Education: Goal: Knowledge of General Education information will improve Description: Including pain rating scale, medication(s)/side effects and non-pharmacologic comfort measures Outcome: Progressing   Problem: Clinical Measurements: Goal: Diagnostic test results will improve Outcome: Progressing   Problem: Nutrition: Goal: Adequate nutrition will be maintained Outcome: Progressing   Problem: Coping: Goal: Level of anxiety will decrease Outcome: Progressing   Problem: Pain Managment: Goal: General experience of comfort will improve and/or be controlled Outcome: Progressing

## 2024-03-25 NOTE — Plan of Care (Signed)

## 2024-03-27 NOTE — Discharge Summary (Addendum)
 Physician Discharge Summary  Katie Chang BJY:[id number] DOB: 11/18/1961 DOA: 03/23/2024  PCP: [name], NP  Admit date: 03/23/2024 Discharge date: 03/27/2024 Discharging to: home     Discharge Diagnoses:   Principal Problem:   Intractable nausea and vomiting Active Problems:   Depression   Hypokalemia   Hyponatremia   Cannabis use disorder   Cervical disc herniation   Lumbar spondylosis   Degenerative lumbar spinal stenosis   Lumbar herniated disc   Hyperlipidemia   Essential hypertension   Tobacco abuse     Brief hospital course: 63 year old female with chronic marijuana use, cyclical vomiting, hypertension, hyperlipidemia, GERD and QT prolongation who has had hospital admissions for cannabis hyperemesis syndrome in the past. She presented to the hospital with intractable nausea and vomiting. Temperature was 102 degrees when she was evaluated in the ER on 5/4 and the patient was told that she may have a GI virus.  GI symptoms did not resolve.   Subjective:  Nausea and vomiting have resolved.  She is tolerating solid food   Assessment and Plan: Principal Problem:   Intractable nausea and vomiting -Possibly viral at this time as she was febrile and had leukocytosis -Treated symptomatically and symptoms resolved - Had slowly advance to solid food   Active Problems:  Electrolyte abnormalities - Sodium dropped from 140-127 - Potassium dropped from 3.9-2.5 and was 2.6 t on 5/7-replaced and improved to 3.9   Hypertension - Continue amlodipine , metoprolol  and valsartan   Chronic cannabis use with reported cannabis hyperemesis syndrome UDS positive for benzodiazepines, opiates and tetrahydrocannabinol 09 back better continue and then I will pick it up sometimes and yeah yeah that helps me against this yeah that helps my lower back I use it in my car to states  Obesity class I  Body mass index is 31.11 kg/m.            Discharge Instructions  Discharge  Instructions     Diet - low sodium heart healthy   Complete by: As directed    Increase activity slowly   Complete by: As directed       Allergies as of 03/25/2024       Reactions   Avelox [moxifloxacin Hcl In Nacl] Hives, Other (See Comments)   No allergic reaction to Levaquin.   Haldol  [haloperidol  Lactate] Anxiety, Other (See Comments)   "Night terrors"        Medication List     TAKE these medications    albuterol  108 (90 Base) MCG/ACT inhaler Commonly known as: ProAir  HFA Inhale 2 puffs into the lungs every 6 (six) hours as needed for wheezing.   amLODipine  5 MG tablet Commonly known as: NORVASC  Take 5 mg by mouth daily.   celecoxib 200 MG capsule Commonly known as: CELEBREX Take 200 mg by mouth daily.   cholecalciferol  25 MCG (1000 UNIT) tablet Commonly known as: VITAMIN D3 Take 1,000 Units by mouth daily.   DHEA PO Take 1 capsule by mouth daily at 12 noon.   esomeprazole 40 MG capsule Commonly known as: NEXIUM Take 40 mg by mouth daily.   ferrous sulfate 325 (65 FE) MG EC tablet Take 325 mg by mouth in the morning and at bedtime.   fluticasone  50 MCG/ACT nasal spray Commonly known as: FLONASE  Place 2 sprays into both nostrils daily as needed for allergies.   levocetirizine 5 MG tablet Commonly known as: XYZAL  Take 0.5 tablets (2.5 mg total) by mouth every evening. What changed: how much to take  lidocaine  5 % Commonly known as: LIDODERM  Apply to right sacrum as needed (PRN). Remove & Discard patch within 12 hours and wait 12 hours before applying another patch.   LORazepam  1 MG tablet Commonly known as: ATIVAN  Take 1 mg by mouth at bedtime.   magnesium  hydroxide 400 MG/5ML suspension Commonly known as: MILK OF MAGNESIA Take 30 mLs by mouth at bedtime.   metoprolol  succinate 25 MG 24 hr tablet Commonly known as: TOPROL -XL Take 25 mg by mouth daily.   montelukast  10 MG tablet Commonly known as: SINGULAIR  TAKE ONE TABLET AT  BEDTIME. What changed: when to take this   nicotine  14 mg/24hr patch Commonly known as: NICODERM CQ  - dosed in mg/24 hours Place 1 patch (14 mg total) onto the skin daily for 28 days.   nicotine  7 mg/24hr patch Commonly known as: NICODERM CQ  - dosed in mg/24 hr Place 1 patch (7 mg total) onto the skin daily. Start taking on: April 24, 2024   ondansetron  4 MG tablet Commonly known as: ZOFRAN  Take 1 tablet (4 mg total) by mouth every 6 (six) hours as needed for nausea or vomiting.   promethazine  25 MG suppository Commonly known as: PHENERGAN  Place 1 suppository (25 mg total) rectally every 6 (six) hours as needed for nausea or vomiting.   rosuvastatin  10 MG tablet Commonly known as: CRESTOR  Take 10 mg by mouth See admin instructions. Take 10mg  by mouth Monday thru Friday.   valsartan 80 MG tablet Commonly known as: DIOVAN Take 80 mg by mouth at bedtime.   venlafaxine  37.5 MG tablet Commonly known as: EFFEXOR  Take 112.5 mg by mouth daily.            The results of significant diagnostics from this hospitalization (including imaging, microbiology, ancillary and laboratory) are listed below for reference.    CT ABDOMEN PELVIS W CONTRAST Result Date: 03/21/2024 CLINICAL DATA:  Sepsis EXAM: CT ABDOMEN AND PELVIS WITH CONTRAST TECHNIQUE: Multidetector CT imaging of the abdomen and pelvis was performed using the standard protocol following bolus administration of intravenous contrast. RADIATION DOSE REDUCTION: This exam was performed according to the departmental dose-optimization program which includes automated exposure control, adjustment of the mA and/or kV according to patient size and/or use of iterative reconstruction technique. CONTRAST:  OMNIPAQUE  IOHEXOL  300 MG/ML  SOLN COMPARISON:  12/01/2023 FINDINGS: Lower chest: Small hiatal hernia.  No acute findings. Hepatobiliary: Diffuse low-density throughout the liver compatible with fatty infiltration. No focal abnormality.  Gallbladder unremarkable. Pancreas: No focal abnormality or ductal dilatation. Spleen: No focal abnormality.  Normal size. Adrenals/Urinary Tract: No adrenal abnormality. No focal renal abnormality. No stones or hydronephrosis. Urinary bladder is unremarkable. Stomach/Bowel: Stomach, large and small bowel grossly unremarkable. Vascular/Lymphatic: Diffuse aortic atherosclerosis. No evidence of aneurysm or adenopathy. Reproductive: Calcified fibroid in the uterus.  No adnexal mass. Other: No free fluid or free air. Musculoskeletal: No acute bony abnormality. Postoperative and degenerative changes in the lumbar spine. IMPRESSION: No acute findings in the abdomen or pelvis. Hepatic steatosis. Aortic atherosclerosis. Fibroid uterus. Electronically Signed   By: Janeece Mechanic M.D.   On: 03/21/2024 21:12   DG Chest 2 View Result Date: 03/21/2024 CLINICAL DATA:  Fever EXAM: CHEST - 2 VIEW COMPARISON:  07/24/2023 FINDINGS: The heart size and mediastinal contours are within normal limits. Both lungs are clear. The visualized skeletal structures are unremarkable. IMPRESSION: No active cardiopulmonary disease. Electronically Signed   By: Janeece Mechanic M.D.   On: 03/21/2024 20:07   Labs:  Basic Metabolic Panel: Recent Labs  Lab 03/21/24 1610 03/21/24 1616 03/21/24 1922 03/23/24 1123 03/23/24 1400 03/23/24 2127 03/24/24 0356 03/24/24 0403 03/24/24 1331 03/25/24 0402  NA  --    < > 140 127*  --   --   --  127* 131* 131*  K  --    < > 3.2* 2.5*  --  2.0*  --  2.6* 3.2* 3.9  CL  --    < > 100 88*  --   --   --  95* 98 101  CO2  --    < > 20* 18*  --   --   --  21* 20* 21*  GLUCOSE  --    < > 145* 139*  --   --   --  108* 132* 101*  BUN  --    < > 7* 9  --   --   --  5* <5* 7*  CREATININE  --    < > 0.74 0.92  --   --   --  0.54 0.47 0.40*  CALCIUM   --    < > 10.1 10.1  --   --   --  8.3* 9.1 8.8*  MG 1.9  --   --   --  1.7  --  2.5*  --   --   --    < > = values in this interval not displayed.      CBC: Recent Labs  Lab 03/21/24 1616 03/23/24 1123 03/24/24 0403  WBC 11.3* 15.4* 14.0*  NEUTROABS  --  10.2*  --   HGB 15.3* 15.2* 13.2  HCT 42.5 40.6 37.8  MCV 82.4 79.8* 85.1  PLT 420* 409* 328         SIGNED:   Sedalia Dacosta, MD  [facility] 03/27/2024, 5:37 PM Time taking on discharge: 50 minutes

## 2024-04-16 ENCOUNTER — Other Ambulatory Visit: Payer: Self-pay

## 2024-04-26 ENCOUNTER — Other Ambulatory Visit: Payer: Self-pay

## 2024-04-26 ENCOUNTER — Inpatient Hospital Stay (HOSPITAL_COMMUNITY)
Admission: EM | Admit: 2024-04-26 | Discharge: 2024-04-30 | DRG: 641 | Disposition: A | Discharge status: Home / Self Care | Attending: Internal Medicine | Admitting: Internal Medicine

## 2024-04-26 ENCOUNTER — Encounter (HOSPITAL_COMMUNITY): Payer: Self-pay

## 2024-04-26 DIAGNOSIS — E876 Hypokalemia: Secondary | ICD-10-CM | POA: Diagnosis present

## 2024-04-26 DIAGNOSIS — J45909 Unspecified asthma, uncomplicated: Secondary | ICD-10-CM | POA: Diagnosis not present

## 2024-04-26 DIAGNOSIS — Z87410 Personal history of cervical dysplasia: Secondary | ICD-10-CM | POA: Diagnosis not present

## 2024-04-26 DIAGNOSIS — Z823 Family history of stroke: Secondary | ICD-10-CM | POA: Diagnosis not present

## 2024-04-26 DIAGNOSIS — E860 Dehydration: Secondary | ICD-10-CM | POA: Diagnosis not present

## 2024-04-26 DIAGNOSIS — M5030 Other cervical disc degeneration, unspecified cervical region: Secondary | ICD-10-CM | POA: Diagnosis present

## 2024-04-26 DIAGNOSIS — Z888 Allergy status to other drugs, medicaments and biological substances status: Secondary | ICD-10-CM

## 2024-04-26 DIAGNOSIS — F17210 Nicotine dependence, cigarettes, uncomplicated: Secondary | ICD-10-CM | POA: Diagnosis not present

## 2024-04-26 DIAGNOSIS — Z8249 Family history of ischemic heart disease and other diseases of the circulatory system: Secondary | ICD-10-CM | POA: Diagnosis not present

## 2024-04-26 DIAGNOSIS — Z79899 Other long term (current) drug therapy: Secondary | ICD-10-CM

## 2024-04-26 DIAGNOSIS — Z833 Family history of diabetes mellitus: Secondary | ICD-10-CM

## 2024-04-26 DIAGNOSIS — Z0389 Encounter for observation for other suspected diseases and conditions ruled out: Secondary | ICD-10-CM | POA: Diagnosis not present

## 2024-04-26 DIAGNOSIS — M51369 Other intervertebral disc degeneration, lumbar region without mention of lumbar back pain or lower extremity pain: Secondary | ICD-10-CM | POA: Diagnosis not present

## 2024-04-26 DIAGNOSIS — E785 Hyperlipidemia, unspecified: Secondary | ICD-10-CM | POA: Diagnosis present

## 2024-04-26 DIAGNOSIS — K219 Gastro-esophageal reflux disease without esophagitis: Secondary | ICD-10-CM | POA: Diagnosis present

## 2024-04-26 DIAGNOSIS — R112 Nausea with vomiting, unspecified: Principal | ICD-10-CM | POA: Diagnosis present

## 2024-04-26 DIAGNOSIS — E861 Hypovolemia: Secondary | ICD-10-CM | POA: Diagnosis not present

## 2024-04-26 DIAGNOSIS — R1084 Generalized abdominal pain: Secondary | ICD-10-CM | POA: Diagnosis not present

## 2024-04-26 DIAGNOSIS — I10 Essential (primary) hypertension: Secondary | ICD-10-CM | POA: Diagnosis not present

## 2024-04-26 DIAGNOSIS — E871 Hypo-osmolality and hyponatremia: Secondary | ICD-10-CM | POA: Diagnosis not present

## 2024-04-26 DIAGNOSIS — R1111 Vomiting without nausea: Secondary | ICD-10-CM | POA: Diagnosis not present

## 2024-04-26 DIAGNOSIS — Z1152 Encounter for screening for COVID-19: Secondary | ICD-10-CM | POA: Diagnosis not present

## 2024-04-26 DIAGNOSIS — Z886 Allergy status to analgesic agent status: Secondary | ICD-10-CM | POA: Diagnosis not present

## 2024-04-26 DIAGNOSIS — R1033 Periumbilical pain: Secondary | ICD-10-CM | POA: Diagnosis not present

## 2024-04-26 DIAGNOSIS — Z803 Family history of malignant neoplasm of breast: Secondary | ICD-10-CM | POA: Diagnosis not present

## 2024-04-26 DIAGNOSIS — D259 Leiomyoma of uterus, unspecified: Secondary | ICD-10-CM | POA: Diagnosis not present

## 2024-04-26 DIAGNOSIS — R9431 Abnormal electrocardiogram [ECG] [EKG]: Secondary | ICD-10-CM | POA: Diagnosis not present

## 2024-04-26 DIAGNOSIS — M1990 Unspecified osteoarthritis, unspecified site: Secondary | ICD-10-CM | POA: Diagnosis not present

## 2024-04-26 DIAGNOSIS — K449 Diaphragmatic hernia without obstruction or gangrene: Secondary | ICD-10-CM | POA: Diagnosis not present

## 2024-04-26 DIAGNOSIS — R457 State of emotional shock and stress, unspecified: Secondary | ICD-10-CM | POA: Diagnosis not present

## 2024-04-26 DIAGNOSIS — R109 Unspecified abdominal pain: Secondary | ICD-10-CM | POA: Diagnosis not present

## 2024-04-26 LAB — COMPREHENSIVE METABOLIC PANEL WITH GFR
ALT: 17 U/L (ref 0–44)
AST: 22 U/L (ref 15–41)
Albumin: 4.8 g/dL (ref 3.5–5.0)
Alkaline Phosphatase: 83 U/L (ref 38–126)
Anion gap: 19 — ABNORMAL HIGH (ref 5–15)
BUN: 7 mg/dL — ABNORMAL LOW (ref 8–23)
CO2: 17 mmol/L — ABNORMAL LOW (ref 22–32)
Calcium: 10.1 mg/dL (ref 8.9–10.3)
Chloride: 98 mmol/L (ref 98–111)
Creatinine, Ser: 0.75 mg/dL (ref 0.44–1.00)
GFR, Estimated: >60 mL/min (ref >60)
Glucose, Bld: 191 mg/dL — ABNORMAL HIGH (ref 70–99)
Potassium: 3.4 mmol/L — ABNORMAL LOW (ref 3.5–5.1)
Sodium: 134 mmol/L — ABNORMAL LOW (ref 135–145)
Total Bilirubin: 0.7 mg/dL (ref 0.0–1.2)
Total Protein: 8.1 g/dL (ref 6.5–8.1)

## 2024-04-26 LAB — URINALYSIS, ROUTINE W REFLEX MICROSCOPIC
Bilirubin Urine: NEGATIVE
Glucose, UA: 50 mg/dL — AB
Ketones, ur: 20 mg/dL — AB
Leukocytes,Ua: NEGATIVE
Nitrite: NEGATIVE
Protein, ur: 100 mg/dL — AB
Specific Gravity, Urine: 1.019 (ref 1.005–1.030)
pH: 6 (ref 5.0–8.0)

## 2024-04-26 LAB — CBC
HCT: 45 % (ref 36.0–46.0)
Hemoglobin: 15.9 g/dL — ABNORMAL HIGH (ref 12.0–15.0)
MCH: 30.1 pg (ref 26.0–34.0)
MCHC: 35.3 g/dL (ref 30.0–36.0)
MCV: 85.1 fL (ref 80.0–100.0)
Platelets: 477 10*3/uL — ABNORMAL HIGH (ref 150–400)
RBC: 5.29 MIL/uL — ABNORMAL HIGH (ref 3.87–5.11)
RDW: 14.3 % (ref 11.5–15.5)
WBC: 13.6 10*3/uL — ABNORMAL HIGH (ref 4.0–10.5)
nRBC: 0 % (ref 0.0–0.2)

## 2024-04-26 LAB — LIPASE, BLOOD: Lipase: 24 U/L (ref 11–51)

## 2024-04-26 MED ORDER — METOCLOPRAMIDE HCL 5 MG/ML IJ SOLN
10.0000 mg | Freq: Once | INTRAMUSCULAR | Status: AC
  Administered 2024-04-26: 10 mg via INTRAVENOUS
  Filled 2024-04-26: qty 2

## 2024-04-26 NOTE — ED Triage Notes (Signed)
 Pt vomiting at this time. Pt states she has taken zofran  3 times today and it hasn't helped

## 2024-04-26 NOTE — ED Notes (Signed)
 Pt unable to void at this time.  KM

## 2024-04-26 NOTE — ED Triage Notes (Signed)
 Pt normally takes deltal 8 and has not had it today. Woke up this morning vomiting and is still vomiting. Is anxious abdomen is hurting.

## 2024-04-26 NOTE — ED Provider Notes (Signed)
 [place] [facility] Provider Note   CSN: [id number] Arrival date & time: 04/26/24  2134     History {Add pertinent medical, surgical, social history, OB history to HPI:1} Chief Complaint  Patient presents with   Abdominal Pain    Katie Chang is a 63 y.o. female.  Patient with history of cyclical vomiting syndrome, degenerative disc disease, arthritis, migraines, THC use presents to the emergency department complaining of intractable nausea and vomiting.  She states she normally uses delta 8 THC but has not used any today.  She woke up this morning with nausea and vomiting and reports vomiting throughout the day.  Patient also reports anxiety and abdominal pain in the periumbilical area.  She denies diarrhea, chest pain, shortness of breath, fevers.  She states she used Zofran  3 times throughout the day with no relief of symptoms.  She was recently admitted to the hospital from May 6 through May 8 for similar symptoms.   Abdominal Pain      Home Medications Prior to Admission medications   Medication Sig Start Date End Date Taking? Authorizing Provider  albuterol  (PROAIR  HFA) 108 (90 BASE) MCG/ACT inhaler Inhale 2 puffs into the lungs every 6 (six) hours as needed for wheezing. 03/11/13   Roark Chick, PA-C  amLODipine  (NORVASC ) 5 MG tablet Take 5 mg by mouth daily. 03/29/22   [provider]  celecoxib (CELEBREX) 200 MG capsule Take 200 mg by mouth daily. 05/23/22   [provider]  cholecalciferol  (VITAMIN D3) 25 MCG (1000 UT) tablet Take 1,000 Units by mouth daily.    [provider]  esomeprazole (NEXIUM) 40 MG capsule Take 40 mg by mouth daily. 03/04/18   [provider]  ferrous sulfate 325 (65 FE) MG EC tablet Take 325 mg by mouth in the morning and at bedtime.    [provider]  fluticasone  (FLONASE ) 50 MCG/ACT nasal spray Place 2 sprays into both nostrils daily as needed for allergies.     [provider]  levocetirizine (XYZAL ) 5 MG tablet Take 0.5 tablets (2.5 mg total) by mouth every evening. Patient taking differently: Take 5 mg by mouth every evening. 03/03/23   Verlyn Goad, MD  lidocaine  (LIDODERM ) 5 % Apply to right sacrum as needed (PRN). Remove & Discard patch within 12 hours and wait 12 hours before applying another patch. 03/25/24   Rizwan, Saima, MD  LORazepam  (ATIVAN ) 1 MG tablet Take 1 mg by mouth at bedtime. 04/09/18   [provider]  magnesium  hydroxide (MILK OF MAGNESIA) 400 MG/5ML suspension Take 30 mLs by mouth at bedtime.    [provider]  metoprolol  succinate (TOPROL -XL) 25 MG 24 hr tablet Take 25 mg by mouth daily. 04/12/22   [provider]  montelukast  (SINGULAIR ) 10 MG tablet TAKE ONE TABLET AT BEDTIME. Patient taking differently: Take 10 mg by mouth every morning. 04/22/14   Rauls, Kristi M, MD  nicotine  (NICODERM CQ  - DOSED IN MG/24 HR) 7 mg/24hr patch Place 1 patch (7 mg total) onto the skin daily. 04/24/24 04/24/25  Rizwan, Saima, MD  ondansetron  (ZOFRAN ) 4 MG tablet Take 1 tablet (4 mg total) by mouth every 6 (six) hours as needed for nausea or vomiting. 12/01/23   Adel Aden, PA-C  Prasterone, DHEA, (DHEA PO) Take 1 capsule by mouth daily at 12 noon.    [provider]  promethazine  (PHENERGAN ) 25 MG suppository Place 1 suppository (25 mg total) rectally every  6 (six) hours as needed for nausea or vomiting. 03/21/24   Algonquin Butter, PA  rosuvastatin  (CRESTOR ) 10 MG tablet Take 10 mg by mouth See admin instructions. Take 10mg  by mouth Monday thru Friday. 03/29/22   [provider]  valsartan (DIOVAN) 80 MG tablet Take 80 mg by mouth at bedtime. 03/07/22   [provider]  venlafaxine  (EFFEXOR ) 37.5 MG tablet Take 112.5 mg by mouth daily. 07/31/22   [provider]      Allergies    Avelox [moxifloxacin hcl in nacl] and Haldol  [haloperidol  lactate]    Review of Systems    Review of Systems  Gastrointestinal:  Positive for abdominal pain.    Physical Exam Updated Vital Signs BP 117/89   Pulse (!) 126   Temp 99.7 F (37.6 C) (Oral)   Resp (!) 22   Ht 5\' 3"  (1.6 m)   Wt 78.9 kg   SpO2 99%   BMI 30.82 kg/m  Physical Exam Vitals and nursing note reviewed.  Constitutional:      General: She is in acute distress.     Appearance: She is well-developed.  HENT:     Head: Normocephalic and atraumatic.  Eyes:     Conjunctiva/sclera: Conjunctivae normal.  Cardiovascular:     Rate and Rhythm: Regular rhythm. Tachycardia present.     Heart sounds: No murmur heard. Pulmonary:     Effort: Pulmonary effort is normal. No respiratory distress.     Breath sounds: Normal breath sounds.  Abdominal:     Palpations: Abdomen is soft.     Tenderness: There is generalized abdominal tenderness.  Musculoskeletal:        General: No swelling.     Cervical back: Neck supple.  Skin:    General: Skin is warm and dry.     Capillary Refill: Capillary refill takes less than 2 seconds.  Neurological:     Mental Status: She is alert.  Psychiatric:        Mood and Affect: Mood is anxious.     ED Results / Procedures / Treatments   Labs (all labs ordered are listed, but only abnormal results are displayed) Labs Reviewed  CBC - Abnormal; Notable for the following components:      Result Value   WBC 13.6 (*)    RBC 5.29 (*)    Hemoglobin 15.9 (*)    Platelets 477 (*)    All other components within normal limits  LIPASE, BLOOD  COMPREHENSIVE METABOLIC PANEL WITH GFR  URINALYSIS, ROUTINE W REFLEX MICROSCOPIC    EKG None  Radiology No results found.  Procedures Procedures  {Document cardiac monitor, telemetry assessment procedure when appropriate:1}  Medications Ordered in ED Medications  metoCLOPramide  (REGLAN ) injection 10 mg (10 mg Intravenous Given 04/26/24 2251)    ED Course/ Medical Decision Making/ A&P   {   Click here for ABCD2, HEART and  other calculatorsREFRESH Note before signing :1}                              Medical Decision Making Amount and/or Complexity of Data Reviewed Labs: ordered. Radiology: ordered.  Risk Prescription drug management.   This patient presents to the ED for concern of nausea and vomiting, this involves an extensive number of treatment options, and is a complaint that carries with it a high risk of complications and morbidity.  The differential diagnosis includes cyclical vomiting syndrome, gastroenteritis, cholecystitis, appendicitis, others  Co morbidities / Chronic conditions that complicate the patient evaluation  History of cyclical vomiting syndrome, THC usage   Additional history obtained:  Additional history obtained from EMR External records from outside source obtained and reviewed including recent discharge summary   Lab Tests:  I Ordered, and personally interpreted labs.  The pertinent results include: Leukocytosis with a white count of 13,600   Imaging Studies ordered:  I ordered imaging studies including CT abdomen pelvis with contrast I independently visualized and interpreted imaging which showed *** I agree with the radiologist interpretation   Cardiac Monitoring: / EKG:  The patient was maintained on a cardiac monitor.  I personally viewed and interpreted the cardiac monitored which showed an underlying rhythm of: ***   Problem List / ED Course / Critical interventions / Medication management   I ordered medication including Reglan  Reevaluation of the patient after these medicines showed that the patient *** I have reviewed the patients home medicines and have made adjustments as needed   Consultations Obtained:  I requested consultation with the ***,  and discussed lab and imaging findings as well as pertinent plan - they recommend: ***   Social Determinants of Health:  ***   Test / Admission - Considered:  ***   {Document critical care  time when appropriate:1} {Document review of labs and clinical decision tools ie heart score, Chads2Vasc2 etc:1}  {Document your independent review of radiology images, and any outside records:1} {Document your discussion with family members, caretakers, and with consultants:1} {Document social determinants of health affecting pt's care:1} {Document your decision making why or why not admission, treatments were needed:1} Final Clinical Impression(s) / ED Diagnoses Final diagnoses:  None    Rx / DC Orders ED Discharge Orders     None

## 2024-04-27 ENCOUNTER — Emergency Department (HOSPITAL_COMMUNITY)

## 2024-04-27 ENCOUNTER — Observation Stay (HOSPITAL_COMMUNITY)

## 2024-04-27 DIAGNOSIS — Z0389 Encounter for observation for other suspected diseases and conditions ruled out: Secondary | ICD-10-CM | POA: Diagnosis not present

## 2024-04-27 DIAGNOSIS — R112 Nausea with vomiting, unspecified: Secondary | ICD-10-CM | POA: Diagnosis not present

## 2024-04-27 LAB — RESP PANEL BY RT-PCR (RSV, FLU A&B, COVID)  RVPGX2
Influenza A by PCR: NEGATIVE
Influenza B by PCR: NEGATIVE
Resp Syncytial Virus by PCR: NEGATIVE
SARS Coronavirus 2 by RT PCR: NEGATIVE

## 2024-04-27 LAB — CBC
HCT: 39.3 % (ref 36.0–46.0)
Hemoglobin: 13.9 g/dL (ref 12.0–15.0)
MCH: 29.6 pg (ref 26.0–34.0)
MCHC: 35.4 g/dL (ref 30.0–36.0)
MCV: 83.8 fL (ref 80.0–100.0)
Platelets: 397 10*3/uL (ref 150–400)
RBC: 4.69 MIL/uL (ref 3.87–5.11)
RDW: 14.6 % (ref 11.5–15.5)
WBC: 14.2 10*3/uL — ABNORMAL HIGH (ref 4.0–10.5)
nRBC: 0 % (ref 0.0–0.2)

## 2024-04-27 LAB — CREATININE, SERUM
Creatinine, Ser: 0.72 mg/dL (ref 0.44–1.00)
GFR, Estimated: >60 mL/min (ref >60)

## 2024-04-27 LAB — RESPIRATORY PANEL BY PCR

## 2024-04-27 MED ORDER — VITAMIN D 25 MCG (1000 UNIT) PO TABS
1000.0000 [IU] | ORAL_TABLET | Freq: Every day | ORAL | Status: DC
  Administered 2024-04-27 – 2024-04-30 (×3): 1000 [IU] via ORAL
  Filled 2024-04-27 (×3): qty 1

## 2024-04-27 MED ORDER — MAGNESIUM HYDROXIDE 400 MG/5ML PO SUSP: 30.0000 mL | Freq: Every evening | ORAL | Status: DC | PRN

## 2024-04-27 MED ORDER — VENLAFAXINE HCL 75 MG PO TABS
112.5000 mg | ORAL_TABLET | Freq: Every day | ORAL | Status: DC
  Administered 2024-04-27 – 2024-04-30 (×4): 112.5 mg via ORAL
  Filled 2024-04-27 (×4): qty 1

## 2024-04-27 MED ORDER — ENOXAPARIN SODIUM 40 MG/0.4ML IJ SOSY
40.0000 mg | PREFILLED_SYRINGE | INTRAMUSCULAR | Status: DC
  Administered 2024-04-27 – 2024-04-30 (×4): 40 mg via SUBCUTANEOUS
  Filled 2024-04-27 (×4): qty 0.4

## 2024-04-27 MED ORDER — IPRATROPIUM-ALBUTEROL 0.5-2.5 (3) MG/3ML IN SOLN: 3.0000 mL | Freq: Four times a day (QID) | RESPIRATORY_TRACT | Status: DC | PRN

## 2024-04-27 MED ORDER — HYDROXYZINE HCL 25 MG PO TABS
25.0000 mg | ORAL_TABLET | Freq: Three times a day (TID) | ORAL | Status: DC | PRN
  Administered 2024-04-27 – 2024-04-29 (×4): 25 mg via ORAL
  Filled 2024-04-27 (×4): qty 1

## 2024-04-27 MED ORDER — ONDANSETRON HCL 4 MG/2ML IJ SOLN
4.0000 mg | Freq: Four times a day (QID) | INTRAMUSCULAR | Status: DC | PRN
  Administered 2024-04-27 – 2024-04-29 (×9): 4 mg via INTRAVENOUS
  Filled 2024-04-27 (×9): qty 2

## 2024-04-27 MED ORDER — DROPERIDOL 2.5 MG/ML IJ SOLN
1.2500 mg | Freq: Once | INTRAMUSCULAR | Status: AC
  Administered 2024-04-27: 1.25 mg via INTRAVENOUS
  Filled 2024-04-27: qty 2

## 2024-04-27 MED ORDER — IPRATROPIUM-ALBUTEROL 0.5-2.5 (3) MG/3ML IN SOLN
3.0000 mL | Freq: Four times a day (QID) | RESPIRATORY_TRACT | Status: DC
  Administered 2024-04-27: 3 mL via RESPIRATORY_TRACT
  Filled 2024-04-27: qty 3

## 2024-04-27 MED ORDER — LORAZEPAM 1 MG PO TABS
1.0000 mg | ORAL_TABLET | Freq: Once | ORAL | Status: AC
  Administered 2024-04-27: 1 mg via ORAL
  Filled 2024-04-27: qty 1

## 2024-04-27 MED ORDER — MORPHINE SULFATE (PF) 4 MG/ML IV SOLN
4.0000 mg | Freq: Once | INTRAVENOUS | Status: AC
  Administered 2024-04-27: 4 mg via INTRAVENOUS
  Filled 2024-04-27: qty 1

## 2024-04-27 MED ORDER — HYDRALAZINE HCL 20 MG/ML IJ SOLN
5.0000 mg | INTRAMUSCULAR | Status: DC | PRN
  Administered 2024-04-27 – 2024-04-29 (×4): 5 mg via INTRAVENOUS
  Filled 2024-04-27 (×4): qty 1

## 2024-04-27 MED ORDER — SODIUM CHLORIDE 0.9 % IV BOLUS
1000.0000 mL | Freq: Once | INTRAVENOUS | Status: AC
  Administered 2024-04-27: 1000 mL via INTRAVENOUS

## 2024-04-27 MED ORDER — METOPROLOL SUCCINATE ER 25 MG PO TB24
25.0000 mg | ORAL_TABLET | Freq: Every day | ORAL | Status: DC
  Administered 2024-04-27 – 2024-04-30 (×4): 25 mg via ORAL
  Filled 2024-04-27 (×4): qty 1

## 2024-04-27 MED ORDER — IOHEXOL 350 MG/ML SOLN
75.0000 mL | Freq: Once | INTRAVENOUS | Status: AC | PRN
  Administered 2024-04-27: 75 mL via INTRAVENOUS

## 2024-04-27 MED ORDER — OXYCODONE HCL 5 MG PO TABS
5.0000 mg | ORAL_TABLET | Freq: Four times a day (QID) | ORAL | Status: DC | PRN
  Administered 2024-04-27 – 2024-04-29 (×9): 5 mg via ORAL
  Filled 2024-04-27 (×9): qty 1

## 2024-04-27 MED ORDER — LORAZEPAM 2 MG/ML IJ SOLN
1.0000 mg | Freq: Once | INTRAMUSCULAR | Status: AC
  Administered 2024-04-27: 1 mg via INTRAVENOUS
  Filled 2024-04-27: qty 1

## 2024-04-27 MED ORDER — AMLODIPINE BESYLATE 5 MG PO TABS
5.0000 mg | ORAL_TABLET | Freq: Every day | ORAL | Status: DC
  Administered 2024-04-27 – 2024-04-30 (×4): 5 mg via ORAL
  Filled 2024-04-27 (×4): qty 1

## 2024-04-27 MED ORDER — ROSUVASTATIN CALCIUM 5 MG PO TABS
10.0000 mg | ORAL_TABLET | ORAL | Status: DC
  Administered 2024-04-27 – 2024-04-29 (×3): 10 mg via ORAL
  Filled 2024-04-27 (×3): qty 2

## 2024-04-27 MED ORDER — PANTOPRAZOLE SODIUM 40 MG PO TBEC
40.0000 mg | DELAYED_RELEASE_TABLET | Freq: Every day | ORAL | Status: DC
  Administered 2024-04-27 – 2024-04-30 (×4): 40 mg via ORAL
  Filled 2024-04-27 (×4): qty 1

## 2024-04-27 MED ORDER — MAGNESIUM HYDROXIDE 400 MG/5ML PO SUSP
30.0000 mL | Freq: Every day | ORAL | Status: DC
  Filled 2024-04-27: qty 30

## 2024-04-27 MED ORDER — NICOTINE 7 MG/24HR TD PT24
7.0000 mg | MEDICATED_PATCH | TRANSDERMAL | Status: DC
  Administered 2024-04-27 – 2024-04-30 (×4): 7 mg via TRANSDERMAL
  Filled 2024-04-27 (×4): qty 1

## 2024-04-27 MED ORDER — IRBESARTAN 75 MG PO TABS: 75.0000 mg | ORAL_TABLET | Freq: Every day | ORAL | Status: DC

## 2024-04-27 MED ORDER — LACTATED RINGERS IV SOLN: INTRAVENOUS | Status: AC

## 2024-04-27 NOTE — TOC CM/SW Note (Signed)
 Transition of Care [facility]) - Inpatient Brief Assessment   Patient Details  Name: Katie Chang MRN: [id number] Date of Birth: 1961-08-11  [facility] CM/SW Contact:    Juliane Och, LCSW Phone Number: 04/27/2024, 9:10 AM   Clinical Narrative:  9:10 AM Per chart review, patient resides at home alone. Patient has a PCP and insurance. Patient does not have SNF or HH history. [place] has DME (rolling walker) history with [employer]. Patient's preferred pharmacy is [facility]. No TOC needs were identified at this time. TOC will continue to follow and be available to assist.  Transition of Care Asessment: Insurance and Status: Insurance coverage has been reviewed Patient has primary care physician: Yes Home environment has been reviewed: Private Residence Prior level of function:: N/A Prior/Current Home Services: No current home services Social Drivers of Health Review: SDOH reviewed no interventions necessary Readmission risk has been reviewed: Yes Transition of care needs: no transition of care needs at this time

## 2024-04-27 NOTE — Plan of Care (Signed)

## 2024-04-27 NOTE — H&P (Addendum)
 History and Physical    Patient: Katie Chang ZOX:[id number] DOB: 1961/09/09 DOA: 04/26/2024 DOS: the patient was seen and examined on 04/27/2024 PCP: [name], NP  Patient coming from: Home  Chief Complaint:  Chief Complaint  Patient presents with   Abdominal Pain   HPI: Katie Chang is a 63 y.o. female with medical history significant of chronic marijuana use, cyclical vomiting, hypertension, hyperlipidemia, GERD p/w intractable n/v.  Pt states she was in her USOH until yesterday at 0800 when 10-15 minutes after waking up she vomited. She took ODT zofran  x3 and was unable to keep anything down. She finally presented to the ED around 1900 when her sx did not resolve. Of note, pt endorses decreased marijuana use, but still smokes marijuana daily. Of note, pt had difficulty taking deep breaths on my exam and had to limit the history/exam.  In the ED, pt tachycardic, hypertensive, and tachypneic on RA. Labs notable for Na 134, K 3.4, and WBC 13.6. Flu, RSV, and COVID neg. CT abd pelvis wnl. Pt admitted to medicine for ongoing w/u.  Review of Systems: As mentioned in the history of present illness. All other systems reviewed and are negative. Past Medical History:  Diagnosis Date   Allergy    Arthritis    Asthma    Cervical dysplasia    Cyclic vomiting syndrome    DDD (degenerative disc disease), cervical    Cervical and Lumbar   Depression    Eczema    GERD (gastroesophageal reflux disease)    Hypertension    Menopausal symptoms    Migraines    PONV (postoperative nausea and vomiting)    sometimes nausea   Recurrent sinus infections    Past Surgical History:  Procedure Laterality Date   BIOPSY  03/03/2023   Procedure: BIOPSY;  Surgeon: Felecia Hopper, MD;  Location: [facility];  Service: Gastroenterology;;   CERVICAL BIOPSY     Dr. Jinger Mount   COLONOSCOPY W/ POLYPECTOMY     COLPOSCOPY     ESOPHAGOGASTRODUODENOSCOPY (EGD) WITH PROPOFOL  N/A 05/13/2016    Procedure: ESOPHAGOGASTRODUODENOSCOPY (EGD) WITH PROPOFOL ;  Surgeon: Garrett Kallman, MD;  Location: [facility]COPY;  Service: Endoscopy;  Laterality: N/A;   ESOPHAGOGASTRODUODENOSCOPY (EGD) WITH PROPOFOL  N/A 03/03/2023   Procedure: ESOPHAGOGASTRODUODENOSCOPY (EGD) WITH PROPOFOL ;  Surgeon: Felecia Hopper, MD;  Location: [facility];  Service: Gastroenterology;  Laterality: N/A;   KNEE SURGERY     NASAL SINUS SURGERY  1992   NECK SURGERY     TONSILLECTOMY  1990   Social History:  reports that she has been smoking cigarettes. She has a 15 pack-year smoking history. She has never used smokeless tobacco. She reports current drug use. Drug: Marijuana. She reports that she does not drink alcohol.  Allergies  Allergen Reactions   Avelox [Moxifloxacin Hcl In Nacl] Hives and Other (See Comments)    No allergic reaction to Levaquin.   Haldol  [Haloperidol  Lactate] Anxiety and Other (See Comments)    "Night terrors"    Family History  Problem Relation Age of Onset   Heart disease Mother    Hypertension Father    Diabetes Paternal Grandfather    Heart disease Paternal Grandfather    Stroke Maternal Grandmother    Stroke Maternal Grandfather    Breast cancer Maternal Aunt        Age 6's    Prior to Admission medications   Medication Sig Start Date End Date Taking? Authorizing Provider  albuterol  (PROAIR  HFA) 108 (90 BASE) MCG/ACT  inhaler Inhale 2 puffs into the lungs every 6 (six) hours as needed for wheezing. 03/11/13  Yes Dunn, Elvia Hammans, PA-C  amLODipine  (NORVASC ) 5 MG tablet Take 5 mg by mouth daily. 03/29/22  Yes [provider]  celecoxib (CELEBREX) 200 MG capsule Take 200 mg by mouth daily. 05/23/22  Yes [provider]  cholecalciferol  (VITAMIN D3) 25 MCG (1000 UT) tablet Take 1,000 Units by mouth daily.   Yes [provider]  esomeprazole (NEXIUM) 40 MG capsule Take 40 mg by mouth daily. 03/04/18  Yes [provider]  ferrous sulfate 325 (65 FE) MG EC  tablet Take 325 mg by mouth in the morning and at bedtime.   Yes [provider]  fluticasone  (FLONASE ) 50 MCG/ACT nasal spray Place 2 sprays into both nostrils daily as needed for allergies.   Yes [provider]  levocetirizine (XYZAL ) 5 MG tablet Take 0.5 tablets (2.5 mg total) by mouth every evening. Patient taking differently: Take 5 mg by mouth every evening. 03/03/23  Yes Verlyn Goad, MD  lidocaine  (LIDODERM ) 5 % Apply to right sacrum as needed (PRN). Remove & Discard patch within 12 hours and wait 12 hours before applying another patch. Patient taking differently: Place 1 patch onto the skin daily as needed (pain). 03/25/24  Yes Rizwan, Saima, MD  LORazepam  (ATIVAN ) 1 MG tablet Take 1 mg by mouth at bedtime. 04/09/18  Yes [provider]  magnesium  hydroxide (PHILLIPS CHEWS) 311 MG CHEW chewable tablet Chew 311 mg by mouth at bedtime.   Yes [provider]  metoprolol  succinate (TOPROL -XL) 25 MG 24 hr tablet Take 25 mg by mouth daily. 04/12/22  Yes [provider]  montelukast  (SINGULAIR ) 10 MG tablet TAKE ONE TABLET AT BEDTIME. Patient taking differently: Take 10 mg by mouth every morning. 04/22/14  Yes Valere Gata, MD  ondansetron  (ZOFRAN -ODT) 4 MG disintegrating tablet Take 4 mg by mouth every 8 (eight) hours as needed for nausea or vomiting. 04/20/24  Yes [provider]  Prasterone, DHEA, (DHEA PO) Take 1 capsule by mouth daily.   Yes [provider]  promethazine  (PHENERGAN ) 25 MG suppository Place 1 suppository (25 mg total) rectally every 6 (six) hours as needed for nausea or vomiting. 03/21/24  Yes Neil Balls A, PA  rosuvastatin  (CRESTOR ) 10 MG tablet Take 10 mg by mouth See admin instructions. Take 10mg  by mouth Monday thru Friday. 03/29/22  Yes [provider]  UNABLE TO FIND Take 1 tablet by mouth at bedtime as needed (sleep). Med Name: Delta 8   Yes [provider]  valsartan (DIOVAN) 80 MG tablet  Take 80 mg by mouth at bedtime. 03/07/22  Yes [provider]  venlafaxine  (EFFEXOR ) 37.5 MG tablet Take 112.5 mg by mouth daily. 07/31/22  Yes [provider]  nicotine  (NICODERM CQ  - DOSED IN MG/24 HR) 7 mg/24hr patch Place 1 patch (7 mg total) onto the skin daily. Patient not taking: Reported on 04/27/2024 04/24/24 04/24/25  Sedalia Dacosta, MD    Physical Exam: Vitals:   04/27/24 0813 04/27/24 0814 04/27/24 0815 04/27/24 0900  BP:    (!) 185/84  Pulse: (!) 104 98 86 85  Resp: (!) 30 (!) 23 18 20   Temp:    98.1 F (36.7 C)  TempSrc:    Oral  SpO2: 100% 100% 100% 100%  Weight:      Height:       General: Alert, oriented x3, resting comfortably in no acute distress Respiratory:  Lungs clear to auscultation bilaterally with normal respiratory effort; no w/r/r Cardiovascular: Regular rate and rhythm w/o m/r/g Abdomen: Soft, nontender, nondistended. Positive bowel sounds   Data Reviewed:  Lab Results  Component Value Date   WBC 14.2 (H) 04/27/2024   HGB 13.9 04/27/2024   HCT 39.3 04/27/2024   MCV 83.8 04/27/2024   PLT 397 04/27/2024   Lab Results  Component Value Date   GLUCOSE 191 (H) 04/26/2024   CALCIUM  10.1 04/26/2024   NA 134 (L) 04/26/2024   K 3.4 (L) 04/26/2024   CO2 17 (L) 04/26/2024   CL 98 04/26/2024   BUN 7 (L) 04/26/2024   CREATININE 0.72 04/27/2024   Lab Results  Component Value Date   ALT 17 04/26/2024   AST 22 04/26/2024   ALKPHOS 83 04/26/2024   BILITOT 0.7 04/26/2024   No results found for: "INR", "PROTIME"  Radiology: CT ABDOMEN PELVIS W CONTRAST Result Date: 04/27/2024 CLINICAL DATA:  Abdominal pain, vomiting EXAM: CT ABDOMEN AND PELVIS WITH CONTRAST TECHNIQUE: Multidetector CT imaging of the abdomen and pelvis was performed using the standard protocol following bolus administration of intravenous contrast. RADIATION DOSE REDUCTION: This exam was performed according to the departmental dose-optimization program which includes automated  exposure control, adjustment of the mA and/or kV according to patient size and/or use of iterative reconstruction technique. CONTRAST:  75mL OMNIPAQUE  IOHEXOL  350 MG/ML SOLN COMPARISON:  03/21/2024 FINDINGS: Lower chest: No acute findings.  Small hiatal hernia. Hepatobiliary: No focal hepatic abnormality. Gallbladder unremarkable. Pancreas: No focal abnormality or ductal dilatation. Spleen: No focal abnormality.  Normal size. Adrenals/Urinary Tract: No adrenal abnormality. No focal renal abnormality. No stones or hydronephrosis. Urinary bladder is unremarkable. Stomach/Bowel: Stomach, large and small bowel grossly unremarkable. Vascular/Lymphatic: Aortic atherosclerosis. No evidence of aneurysm or adenopathy. Reproductive: Calcified fibroid in the uterine fundus. No adnexal mass. Other: No free fluid or free air. Musculoskeletal: No acute bony abnormality. Postoperative and degenerative changes in the lumbar spine. IMPRESSION: No acute findings in the abdomen or pelvis. Stable small hiatal hernia. Aortic atherosclerosis. Fibroid uterus. Electronically Signed   By: Janeece Mechanic M.D.   On: 04/27/2024 00:57    Assessment and Plan: 78F h/o chronic marijuana use, cyclical vomiting, hypertension, hyperlipidemia, GERD p/w intractable n/v.  Intractable n/v Presumed cannabinoid hyperemesis High suspicion for cyclical vomiting given daily marijuana use -MIVF: LR at 75cc/hr for 24h -IV zofran  prn -PTA Effexor ; unclear if pt adherent to this medication and withdrawal from this can cause severe n/v  DOE/SOB High suspicion for COPD given 50+ marijuana smoking hx, and 10+ tobacco smoking hx -Duonebs q6h sch -F/u PFTs -F/u RVP; consider AECOPD tx pending imaging -F/u CT chest WO to eval for emphysema on imaging  HTN -PTA amlodipine  5mg  daily and metoprolol  XL 25mg  daily  HLD -PTA crestor    Advance Care Planning:   Code Status: Full Code   Consults: N/A  Family Communication: N/A  Severity of  Illness: The appropriate patient status for this patient is INPATIENT. Inpatient status is judged to be reasonable and necessary in order to provide the required intensity of service to ensure the patient's safety. The patient's presenting symptoms, physical exam findings, and initial radiographic and laboratory data in the context of their chronic comorbidities is felt to place them at high risk for further clinical deterioration. Furthermore, it is not anticipated that the patient will be medically stable for discharge from the hospital within 2 midnights of admission.   * I certify that at the point of  admission it is my clinical judgment that the patient will require inpatient hospital care spanning beyond 2 midnights from the point of admission due to high intensity of service, high risk for further deterioration and high frequency of surveillance required.*   ------- I spent 55 minutes reviewing previous labs/notes, obtaining separate history at the bedside, counseling/discussing the treatment plan outlined above, ordering medications/tests, and performing clinical documentation.  Author: Arne Langdon, MD 04/27/2024 1:05 PM  For on call review [email or web address].

## 2024-04-27 NOTE — ED Notes (Addendum)
 Pt arrives to room via stretcher, alert, NAD, anxious, interactive. Pain and nausea resolved, denies at this time.

## 2024-04-27 NOTE — ED Notes (Signed)
 Report received, pending arrival.

## 2024-04-28 DIAGNOSIS — R112 Nausea with vomiting, unspecified: Secondary | ICD-10-CM

## 2024-04-28 DIAGNOSIS — E861 Hypovolemia: Secondary | ICD-10-CM | POA: Diagnosis present

## 2024-04-28 DIAGNOSIS — K219 Gastro-esophageal reflux disease without esophagitis: Secondary | ICD-10-CM | POA: Diagnosis present

## 2024-04-28 DIAGNOSIS — M51369 Other intervertebral disc degeneration, lumbar region without mention of lumbar back pain or lower extremity pain: Secondary | ICD-10-CM | POA: Diagnosis present

## 2024-04-28 DIAGNOSIS — F17210 Nicotine dependence, cigarettes, uncomplicated: Secondary | ICD-10-CM | POA: Diagnosis present

## 2024-04-28 DIAGNOSIS — E785 Hyperlipidemia, unspecified: Secondary | ICD-10-CM | POA: Diagnosis present

## 2024-04-28 DIAGNOSIS — Z1152 Encounter for screening for COVID-19: Secondary | ICD-10-CM | POA: Diagnosis not present

## 2024-04-28 DIAGNOSIS — I10 Essential (primary) hypertension: Secondary | ICD-10-CM | POA: Diagnosis present

## 2024-04-28 DIAGNOSIS — J45909 Unspecified asthma, uncomplicated: Secondary | ICD-10-CM | POA: Diagnosis present

## 2024-04-28 DIAGNOSIS — E860 Dehydration: Secondary | ICD-10-CM | POA: Diagnosis present

## 2024-04-28 DIAGNOSIS — Z87410 Personal history of cervical dysplasia: Secondary | ICD-10-CM | POA: Diagnosis not present

## 2024-04-28 DIAGNOSIS — E876 Hypokalemia: Secondary | ICD-10-CM | POA: Diagnosis present

## 2024-04-28 DIAGNOSIS — M1990 Unspecified osteoarthritis, unspecified site: Secondary | ICD-10-CM | POA: Diagnosis present

## 2024-04-28 DIAGNOSIS — Z888 Allergy status to other drugs, medicaments and biological substances status: Secondary | ICD-10-CM | POA: Diagnosis not present

## 2024-04-28 DIAGNOSIS — Z833 Family history of diabetes mellitus: Secondary | ICD-10-CM | POA: Diagnosis not present

## 2024-04-28 DIAGNOSIS — M5030 Other cervical disc degeneration, unspecified cervical region: Secondary | ICD-10-CM | POA: Diagnosis present

## 2024-04-28 DIAGNOSIS — Z803 Family history of malignant neoplasm of breast: Secondary | ICD-10-CM | POA: Diagnosis not present

## 2024-04-28 DIAGNOSIS — Z886 Allergy status to analgesic agent status: Secondary | ICD-10-CM | POA: Diagnosis not present

## 2024-04-28 DIAGNOSIS — Z79899 Other long term (current) drug therapy: Secondary | ICD-10-CM | POA: Diagnosis not present

## 2024-04-28 DIAGNOSIS — E871 Hypo-osmolality and hyponatremia: Secondary | ICD-10-CM | POA: Diagnosis present

## 2024-04-28 DIAGNOSIS — Z823 Family history of stroke: Secondary | ICD-10-CM | POA: Diagnosis not present

## 2024-04-28 DIAGNOSIS — Z8249 Family history of ischemic heart disease and other diseases of the circulatory system: Secondary | ICD-10-CM | POA: Diagnosis not present

## 2024-04-28 LAB — CBC
HCT: 39.6 % (ref 36.0–46.0)
Hemoglobin: 14.1 g/dL (ref 12.0–15.0)
MCH: 30 pg (ref 26.0–34.0)
MCHC: 35.6 g/dL (ref 30.0–36.0)
MCV: 84.3 fL (ref 80.0–100.0)
Platelets: 344 10*3/uL (ref 150–400)
RBC: 4.7 MIL/uL (ref 3.87–5.11)
RDW: 14.4 % (ref 11.5–15.5)
WBC: 13.3 10*3/uL — ABNORMAL HIGH (ref 4.0–10.5)
nRBC: 0 % (ref 0.0–0.2)

## 2024-04-28 LAB — BASIC METABOLIC PANEL WITH GFR
Anion gap: 11 (ref 5–15)
BUN: 5 mg/dL — ABNORMAL LOW (ref 8–23)
CO2: 23 mmol/L (ref 22–32)
Calcium: 9.4 mg/dL (ref 8.9–10.3)
Chloride: 94 mmol/L — ABNORMAL LOW (ref 98–111)
Creatinine, Ser: 0.64 mg/dL (ref 0.44–1.00)
GFR, Estimated: >60 mL/min (ref >60)
Glucose, Bld: 134 mg/dL — ABNORMAL HIGH (ref 70–99)
Potassium: 3 mmol/L — ABNORMAL LOW (ref 3.5–5.1)
Sodium: 128 mmol/L — ABNORMAL LOW (ref 135–145)

## 2024-04-28 LAB — OSMOLALITY: Osmolality: 261 mosm/kg — ABNORMAL LOW (ref 275–295)

## 2024-04-28 MED ORDER — POTASSIUM CHLORIDE CRYS ER 20 MEQ PO TBCR
40.0000 meq | EXTENDED_RELEASE_TABLET | ORAL | Status: AC
  Administered 2024-04-28 (×2): 40 meq via ORAL
  Filled 2024-04-28 (×2): qty 2

## 2024-04-28 MED ORDER — MORPHINE SULFATE (PF) 2 MG/ML IV SOLN
2.0000 mg | Freq: Once | INTRAVENOUS | Status: AC
  Administered 2024-04-28: 2 mg via INTRAVENOUS
  Filled 2024-04-28: qty 1

## 2024-04-28 MED ORDER — LORAZEPAM 1 MG PO TABS
1.0000 mg | ORAL_TABLET | Freq: Once | ORAL | Status: AC
  Administered 2024-04-28: 1 mg via ORAL
  Filled 2024-04-28: qty 1

## 2024-04-28 MED ORDER — SODIUM CHLORIDE 0.9 % IV SOLN: INTRAVENOUS | Status: AC

## 2024-04-28 MED ORDER — LORAZEPAM 1 MG PO TABS
1.0000 mg | ORAL_TABLET | Freq: Every day | ORAL | Status: AC
  Administered 2024-04-28: 1 mg via ORAL
  Filled 2024-04-28: qty 1

## 2024-04-28 NOTE — Progress Notes (Signed)
 Progress Note   Patient: Katie Chang ZOX:[id number] DOB: 03/15/1961 DOA: 04/26/2024     0 DOS: the patient was seen and examined on 04/28/2024   Brief hospital course: Katie Chang is a 63 y.o. female with medical history significant of chronic marijuana use, cyclical vomiting, hypertension, hyperlipidemia, GERD p/w intractable n/v.   Pt states she was in her USOH until yesterday at 0800 when 10-15 minutes after waking up she vomited. She took ODT zofran  x3 and was unable to keep anything down. She finally presented to the ED around 1900 when her sx did not resolve. Of note, pt endorses decreased marijuana use, but still smokes marijuana daily. Of note, pt had difficulty taking deep breaths on my exam and had to limit the history/exam.   In the ED, pt tachycardic, hypertensive, and tachypneic on RA. Labs notable for Na 134, K 3.4, and WBC 13.6. Flu, RSV, and COVID neg. CT abd pelvis wnl. Pt admitted to medicine for ongoing w/u.   Assessment and Plan:  Intractable n/v aCUTE hyponatremia likely secondary to hypovolemia Presumed cannabinoid hyperemesis High suspicion for cyclical vomiting given daily marijuana use - Continue IV fluid Monitor urine electrolytes as well as serum osmolarity -IV zofran  prn -PTA Effexor ; unclear if pt adherent to this medication and withdrawal from this can cause severe n/v   DOE/SOB High suspicion for COPD given 50+ marijuana smoking hx, and 10+ tobacco smoking hx -Duonebs q6h sch -F/u PFTs -F/u RVP; consider AECOPD tx pending imaging -F/u CT chest WO to eval for emphysema on imaging   HTN -PTA amlodipine  5mg  daily and metoprolol  XL 25mg  daily   HLD -PTA crestor     Advance Care Planning:   Code Status: Full Code    Consults: N/A   Family Communication: N/A    Subjective:  Seen and examined at bedside this morning Noted to have hyponatremia due to dehydration  Physical Exam:  General: Alert, oriented x3, resting comfortably in no acute  distress Respiratory: Lungs clear to auscultation bilaterally with normal respiratory effort; no w/r/r Cardiovascular: Regular rate and rhythm w/o m/r/g Abdomen: Soft, nontender, nondistended. Positive bowel sounds   Vitals:   04/28/24 0337 04/28/24 0457 04/28/24 0816 04/28/24 1604  BP: (!) 171/95 (!) 159/84 (!) 158/101 (!) 176/117  Pulse: 94 86 96 76  Resp: 16 16 18 18   Temp: 99.1 F (37.3 C)  98 F (36.7 C)   TempSrc: Oral  Oral   SpO2: 97% 97% 95% 99%  Weight:      Height:        Data Reviewed:    Latest Ref Rng & Units 04/28/2024    4:45 AM 04/27/2024    7:51 AM 04/26/2024    9:45 PM  CBC  WBC 4.0 - 10.5 K/uL 13.3  14.2  13.6   Hemoglobin 12.0 - 15.0 g/dL 81.1  91.4  78.2   Hematocrit 36.0 - 46.0 % 39.6  39.3  45.0   Platelets 150 - 400 K/uL 344  397  477        Latest Ref Rng & Units 04/28/2024    4:45 AM 04/27/2024    7:51 AM 04/26/2024    9:45 PM  BMP  Glucose 70 - 99 mg/dL 956   213   BUN 8 - 23 mg/dL 5   7   Creatinine 0.86 - 1.00 mg/dL 5.78  4.69  6.29   Sodium 135 - 145 mmol/L 128   134   Potassium 3.5 - 5.1 mmol/L  3.0   3.4   Chloride 98 - 111 mmol/L 94   98   CO2 22 - 32 mmol/L 23   17   Calcium  8.9 - 10.3 mg/dL 9.4   09.8   CT scan of the chest reviewed that did not show any significant change  Family Communication: None  Disposition: Status is: Inpatient   Time spent: 55 minutes  Author: Ezzard Holms, MD 04/28/2024 4:25 PM  For on call review [email or web address].

## 2024-04-29 DIAGNOSIS — R112 Nausea with vomiting, unspecified: Secondary | ICD-10-CM | POA: Diagnosis not present

## 2024-04-29 LAB — BASIC METABOLIC PANEL WITH GFR
Anion gap: 13 (ref 5–15)
BUN: 6 mg/dL — ABNORMAL LOW (ref 8–23)
CO2: 18 mmol/L — ABNORMAL LOW (ref 22–32)
Calcium: 9 mg/dL (ref 8.9–10.3)
Chloride: 97 mmol/L — ABNORMAL LOW (ref 98–111)
Creatinine, Ser: 0.63 mg/dL (ref 0.44–1.00)
GFR, Estimated: >60 mL/min (ref >60)
Glucose, Bld: 120 mg/dL — ABNORMAL HIGH (ref 70–99)
Potassium: 2.6 mmol/L — CL (ref 3.5–5.1)
Sodium: 128 mmol/L — ABNORMAL LOW (ref 135–145)

## 2024-04-29 LAB — CBC
HCT: 40.3 % (ref 36.0–46.0)
Hemoglobin: 14.3 g/dL (ref 12.0–15.0)
MCH: 29.7 pg (ref 26.0–34.0)
MCHC: 35.5 g/dL (ref 30.0–36.0)
MCV: 83.6 fL (ref 80.0–100.0)
Platelets: 356 10*3/uL (ref 150–400)
RBC: 4.82 MIL/uL (ref 3.87–5.11)
RDW: 14.1 % (ref 11.5–15.5)
WBC: 13.8 10*3/uL — ABNORMAL HIGH (ref 4.0–10.5)
nRBC: 0 % (ref 0.0–0.2)

## 2024-04-29 LAB — OSMOLALITY, URINE: Osmolality, Ur: 103 mosm/kg — ABNORMAL LOW (ref 300–900)

## 2024-04-29 LAB — RAPID URINE DRUG SCREEN, HOSP PERFORMED
Amphetamines: NOT DETECTED
Barbiturates: NOT DETECTED
Benzodiazepines: NOT DETECTED
Cocaine: NOT DETECTED
Opiates: NOT DETECTED
Tetrahydrocannabinol: POSITIVE — AB

## 2024-04-29 LAB — MAGNESIUM: Magnesium: 1.6 mg/dL — ABNORMAL LOW (ref 1.7–2.4)

## 2024-04-29 LAB — POTASSIUM: Potassium: 3 mmol/L — ABNORMAL LOW (ref 3.5–5.1)

## 2024-04-29 LAB — SODIUM, URINE, RANDOM: Sodium, Ur: 25 mmol/L

## 2024-04-29 MED ORDER — POTASSIUM CHLORIDE 20 MEQ PO PACK
40.0000 meq | PACK | Freq: Once | ORAL | Status: AC
  Administered 2024-04-29: 40 meq via ORAL
  Filled 2024-04-29: qty 2

## 2024-04-29 MED ORDER — POTASSIUM CHLORIDE 10 MEQ/100ML IV SOLN
10.0000 meq | INTRAVENOUS | Status: AC
  Administered 2024-04-29 (×6): 10 meq via INTRAVENOUS
  Filled 2024-04-29 (×6): qty 100

## 2024-04-29 MED ORDER — SODIUM CHLORIDE 0.9 % IV SOLN: INTRAVENOUS | Status: DC

## 2024-04-29 MED ORDER — MENTHOL 3 MG MT LOZG
1.0000 | LOZENGE | OROMUCOSAL | Status: DC | PRN
  Filled 2024-04-29: qty 9

## 2024-04-29 MED ORDER — MAGNESIUM SULFATE 2 GM/50ML IV SOLN
2.0000 g | Freq: Once | INTRAVENOUS | Status: AC
  Administered 2024-04-29: 2 g via INTRAVENOUS
  Filled 2024-04-29: qty 50

## 2024-04-29 MED ORDER — PHENOL 1.4 % MT LIQD
1.0000 | OROMUCOSAL | Status: DC | PRN
  Administered 2024-04-29: 1 via OROMUCOSAL
  Filled 2024-04-29: qty 177

## 2024-04-29 MED ORDER — POTASSIUM CHLORIDE CRYS ER 20 MEQ PO TBCR
40.0000 meq | EXTENDED_RELEASE_TABLET | ORAL | Status: AC
  Administered 2024-04-29 (×2): 40 meq via ORAL
  Filled 2024-04-29 (×2): qty 2

## 2024-04-29 NOTE — Progress Notes (Signed)
 Progress Note   Patient: Katie Chang QIH:[id number] DOB: November 09, 1961 DOA: 04/26/2024     1 DOS: the patient was seen and examined on 04/29/2024     Brief hospital course: Katie Chang is a 63 y.o. female with medical history significant of chronic marijuana use, cyclical vomiting, hypertension, hyperlipidemia, GERD p/w intractable n/v.   Pt states she was in her USOH until yesterday at 0800 when 10-15 minutes after waking up she vomited. She took ODT zofran  x3 and was unable to keep anything down. She finally presented to the ED around 1900 when her sx did not resolve. Of note, pt endorses decreased marijuana use, but still smokes marijuana daily. Of note, pt had difficulty taking deep breaths on my exam and had to limit the history/exam.   In the ED, pt tachycardic, hypertensive, and tachypneic on RA. Labs notable for Na 134, K 3.4, and WBC 13.6. Flu, RSV, and COVID neg. CT abd pelvis wnl. Pt admitted to medicine for ongoing w/u.     Assessment and Plan:  Intractable n/v Acute hyponatremia likely secondary to hypovolemia Presumed cannabinoid hyperemesis High suspicion for cyclical vomiting given daily marijuana use Continue IV fluid resuscitation Continue as needed antiemetics Monitor electrolytes closely Follow-up on urine toxicology -PTA Effexor ; unclear if pt adherent to this medication and withdrawal from this can cause severe n/v   Severe hypokalemia secondary to severe vomiting Hypomagnesemia-continue repletion and monitoring Continue potassium repletion and close monitoring Follow-up on magnesium  level  Shortness of breath-resolved CT scan reviewed   HTN -PTA amlodipine  5mg  daily and metoprolol  XL 25mg  daily   HLD -PTA crestor     Advance Care Planning:   Code Status: Full Code    Consults: N/A   Family Communication: N/A     Subjective:  Seen and examined at bedside this morning Noted to have hyponatremia due to dehydration   Physical Exam:   General:  Alert, oriented x3, resting comfortably in no acute distress Respiratory: Lungs clear to auscultation bilaterally with normal respiratory effort; no w/r/r Cardiovascular: Regular rate and rhythm w/o m/r/g Abdomen: Soft, nontender, nondistended. Positive bowel sounds   Family Communication: None   Disposition: Status is: Inpatient     Time spent: 45 minutes  Data Reviewed: CT chest reviewed that did not show any acute intrapulmonary process    Latest Ref Rng & Units 04/29/2024    5:08 AM 04/28/2024    4:45 AM 04/27/2024    7:51 AM  CBC  WBC 4.0 - 10.5 K/uL 13.8  13.3  14.2   Hemoglobin 12.0 - 15.0 g/dL 87.5  64.3  32.9   Hematocrit 36.0 - 46.0 % 40.3  39.6  39.3   Platelets 150 - 400 K/uL 356  344  397        Latest Ref Rng & Units 04/29/2024    2:34 PM 04/29/2024    5:08 AM 04/28/2024    4:45 AM  BMP  Glucose 70 - 99 mg/dL  518  841   BUN 8 - 23 mg/dL  6  5   Creatinine 6.60 - 1.00 mg/dL  6.30  1.60   Sodium 109 - 145 mmol/L  128  128   Potassium 3.5 - 5.1 mmol/L 3.0  2.6  3.0   Chloride 98 - 111 mmol/L  97  94   CO2 22 - 32 mmol/L  18  23   Calcium  8.9 - 10.3 mg/dL  9.0  9.4     Vitals:   04/29/24 0200  04/29/24 0421 04/29/24 0745 04/29/24 1619  BP: 119/83 (!) 166/94 (!) 166/92 118/77  Pulse: 82 (!) 104 95 80  Resp:  20  18  Temp:  99 F (37.2 C) 98.3 F (36.8 C) 98.6 F (37 C)  TempSrc:  Oral  Oral  SpO2:  100% 100% 100%  Weight:      Height:         Author: Ezzard Holms, MD 04/29/2024 4:34 PM  For on call review [email or web address].

## 2024-04-29 NOTE — Plan of Care (Signed)
  Problem: Pain Managment: Goal: General experience of comfort will improve and/or be controlled Outcome: Progressing   Problem: Safety: Goal: Ability to remain free from injury will improve Outcome: Progressing   Problem: Skin Integrity: Goal: Risk for impaired skin integrity will decrease Outcome: Progressing

## 2024-04-29 NOTE — Plan of Care (Signed)

## 2024-04-30 DIAGNOSIS — R112 Nausea with vomiting, unspecified: Secondary | ICD-10-CM | POA: Diagnosis not present

## 2024-04-30 LAB — CBC
HCT: 37.4 % (ref 36.0–46.0)
Hemoglobin: 12.8 g/dL (ref 12.0–15.0)
MCH: 29.9 pg (ref 26.0–34.0)
MCHC: 34.2 g/dL (ref 30.0–36.0)
MCV: 87.4 fL (ref 80.0–100.0)
Platelets: 310 10*3/uL (ref 150–400)
RBC: 4.28 MIL/uL (ref 3.87–5.11)
RDW: 14.7 % (ref 11.5–15.5)
WBC: 10.5 10*3/uL (ref 4.0–10.5)
nRBC: 0 % (ref 0.0–0.2)

## 2024-04-30 LAB — BASIC METABOLIC PANEL WITH GFR
Anion gap: 9 (ref 5–15)
BUN: 7 mg/dL — ABNORMAL LOW (ref 8–23)
CO2: 19 mmol/L — ABNORMAL LOW (ref 22–32)
Calcium: 8.9 mg/dL (ref 8.9–10.3)
Chloride: 106 mmol/L (ref 98–111)
Creatinine, Ser: 0.73 mg/dL (ref 0.44–1.00)
GFR, Estimated: >60 mL/min (ref >60)
Glucose, Bld: 114 mg/dL — ABNORMAL HIGH (ref 70–99)
Potassium: 3.9 mmol/L (ref 3.5–5.1)
Sodium: 134 mmol/L — ABNORMAL LOW (ref 135–145)

## 2024-04-30 NOTE — Discharge Summary (Signed)
 Physician Discharge Summary   Patient: Katie Chang MRN: [id number] DOB: 08/08/1961  Admit date:     04/26/2024  Discharge date: 04/30/24  Discharge Physician: Ezzard Holms   PCP: [name], NP   Recommendations at discharge:  Follow-up with PCP  Discharge Diagnoses:  Intractable n/v Acute hyponatremia likely secondary to hypovolemia Presumed cannabinoid hyperemesis Severe hypokalemia secondary to severe vomiting Hypomagnesemia-continue repletion and monitoring Shortness of breath-resolved HTN HLD  Hospital Course: Katie Chang is a 63 y.o. female with medical history significant of chronic marijuana use, cyclical vomiting, hypertension, hyperlipidemia, GERD p/w intractable n/v.  Patient was found to have significant hyponatremia as well as hypokalemia due to hypoeuvolemia.  Nausea and vomiting have resolved and electrolyte corrected and therefore patient being discharged today to follow-up with PCP  Consultants: None Procedures performed: None Disposition: Home Diet recommendation:  Cardiac diet DISCHARGE MEDICATION: Allergies as of 04/30/2024       Reactions   Avelox [moxifloxacin Hcl In Nacl] Hives, Other (See Comments)   No allergic reaction to Levaquin.   Haldol  [haloperidol  Lactate] Anxiety, Other (See Comments)   Night terrors        Medication List     TAKE these medications    albuterol  108 (90 Base) MCG/ACT inhaler Commonly known as: ProAir  HFA Inhale 2 puffs into the lungs every 6 (six) hours as needed for wheezing.   amLODipine  5 MG tablet Commonly known as: NORVASC  Take 5 mg by mouth daily.   celecoxib 200 MG capsule Commonly known as: CELEBREX Take 200 mg by mouth daily.   cholecalciferol  25 MCG (1000 UNIT) tablet Commonly known as: VITAMIN D3 Take 1,000 Units by mouth daily.   DHEA PO Take 1 capsule by mouth daily.   esomeprazole 40 MG capsule Commonly known as: NEXIUM Take 40 mg by mouth daily.   ferrous sulfate 325  (65 FE) MG EC tablet Take 325 mg by mouth in the morning and at bedtime.   fluticasone  50 MCG/ACT nasal spray Commonly known as: FLONASE  Place 2 sprays into both nostrils daily as needed for allergies.   levocetirizine 5 MG tablet Commonly known as: XYZAL  Take 0.5 tablets (2.5 mg total) by mouth every evening. What changed: how much to take   lidocaine  5 % Commonly known as: LIDODERM  Apply to right sacrum as needed (PRN). Remove & Discard patch within 12 hours and wait 12 hours before applying another patch. What changed:  how much to take how to take this when to take this reasons to take this additional instructions   LORazepam  1 MG tablet Commonly known as: ATIVAN  Take 1 mg by mouth at bedtime.   magnesium  hydroxide 311 MG Chew chewable tablet Commonly known as: PHILLIPS CHEWS Chew 311 mg by mouth at bedtime.   metoprolol  succinate 25 MG 24 hr tablet Commonly known as: TOPROL -XL Take 25 mg by mouth daily.   montelukast  10 MG tablet Commonly known as: SINGULAIR  TAKE ONE TABLET AT BEDTIME. What changed: when to take this   nicotine  7 mg/24hr patch Commonly known as: NICODERM CQ  - dosed in mg/24 hr Place 1 patch (7 mg total) onto the skin daily.   ondansetron  4 MG disintegrating tablet Commonly known as: ZOFRAN -ODT Take 4 mg by mouth every 8 (eight) hours as needed for nausea or vomiting.   promethazine  25 MG suppository Commonly known as: PHENERGAN  Place 1 suppository (25 mg total) rectally every 6 (six) hours as needed for nausea or vomiting.   rosuvastatin  10 MG  tablet Commonly known as: CRESTOR  Take 10 mg by mouth See admin instructions. Take 10mg  by mouth Monday thru Friday.   UNABLE TO FIND Take 1 tablet by mouth at bedtime as needed (sleep). Med Name: Delta 8   valsartan 80 MG tablet Commonly known as: DIOVAN Take 80 mg by mouth at bedtime.   venlafaxine  37.5 MG tablet Commonly known as: EFFEXOR  Take 112.5 mg by mouth daily.         Discharge Exam: Filed Weights   04/26/24 2130  Weight: 78.9 kg   General: Alert, oriented x3, resting comfortably Respiratory: Lungs clear to auscultation bilaterally  Cardiovascular: Regular rate and rhythm w/o m/r/g Abdomen: Soft, nontender, nondistended. Positive bowel sounds    Condition at discharge: good  The results of significant diagnostics from this hospitalization (including imaging, microbiology, ancillary and laboratory) are listed below for reference.   Imaging Studies: CT CHEST WO CONTRAST Result Date: 04/27/2024 CLINICAL DATA:  COPD suspected EXAM: CT CHEST WITHOUT CONTRAST TECHNIQUE: Multidetector CT imaging of the chest was performed following the standard protocol without IV contrast. RADIATION DOSE REDUCTION: This exam was performed according to the departmental dose-optimization program which includes automated exposure control, adjustment of the mA and/or kV according to patient size and/or use of iterative reconstruction technique. COMPARISON:  Apr 15, 2022 FINDINGS: Cardiovascular: No significant vascular findings. Normal heart size. No pericardial effusion. No significant pericardial calcifications Mediastinum/Nodes: No enlarged mediastinal or axillary lymph nodes. Thyroid gland, trachea, and esophagus demonstrate no significant findings. Lungs/Pleura: Lungs are clear. No pleural effusion or pneumothorax. Upper Abdomen: No acute abnormality. Musculoskeletal: No chest wall mass or suspicious bone lesions identified. IMPRESSION: *No pulmonary nodules. No evidence of significant COPD changes. Electronically Signed   By: Fredrich Jefferson M.D.   On: 04/27/2024 17:14   CT ABDOMEN PELVIS W CONTRAST Result Date: 04/27/2024 CLINICAL DATA:  Abdominal pain, vomiting EXAM: CT ABDOMEN AND PELVIS WITH CONTRAST TECHNIQUE: Multidetector CT imaging of the abdomen and pelvis was performed using the standard protocol following bolus administration of intravenous contrast. RADIATION DOSE  REDUCTION: This exam was performed according to the departmental dose-optimization program which includes automated exposure control, adjustment of the mA and/or kV according to patient size and/or use of iterative reconstruction technique. CONTRAST:  75mL OMNIPAQUE  IOHEXOL  350 MG/ML SOLN COMPARISON:  03/21/2024 FINDINGS: Lower chest: No acute findings.  Small hiatal hernia. Hepatobiliary: No focal hepatic abnormality. Gallbladder unremarkable. Pancreas: No focal abnormality or ductal dilatation. Spleen: No focal abnormality.  Normal size. Adrenals/Urinary Tract: No adrenal abnormality. No focal renal abnormality. No stones or hydronephrosis. Urinary bladder is unremarkable. Stomach/Bowel: Stomach, large and small bowel grossly unremarkable. Vascular/Lymphatic: Aortic atherosclerosis. No evidence of aneurysm or adenopathy. Reproductive: Calcified fibroid in the uterine fundus. No adnexal mass. Other: No free fluid or free air. Musculoskeletal: No acute bony abnormality. Postoperative and degenerative changes in the lumbar spine. IMPRESSION: No acute findings in the abdomen or pelvis. Stable small hiatal hernia. Aortic atherosclerosis. Fibroid uterus. Electronically Signed   By: Janeece Mechanic M.D.   On: 04/27/2024 00:57    Microbiology: Results for orders placed or performed during the hospital encounter of 04/26/24  Resp panel by RT-PCR (RSV, Flu A&B, Covid) Anterior Nasal Swab     Status: None   Collection Time: 04/27/24  5:19 AM   Specimen: Anterior Nasal Swab  Result Value Ref Range Status   SARS Coronavirus 2 by RT PCR NEGATIVE NEGATIVE Final   Influenza A by PCR NEGATIVE NEGATIVE Final   Influenza B by  PCR NEGATIVE NEGATIVE Final    Comment: (NOTE) The Xpert Xpress SARS-CoV-2/FLU/RSV plus assay is intended as an aid in the diagnosis of influenza from Nasopharyngeal swab specimens and should not be used as a sole basis for treatment. Nasal washings and aspirates are unacceptable for Xpert Xpress  SARS-CoV-2/FLU/RSV testing.  Fact Sheet for Patients: BloggerCourse.com  Fact Sheet for Healthcare Providers: SeriousBroker.it  This test is not yet approved or cleared by the [place]  FDA and has been authorized for detection and/or diagnosis of SARS-CoV-2 by FDA under an Emergency Use Authorization (EUA). This EUA will remain in effect (meaning this test can be used) for the duration of the COVID-19 declaration under Section 564(b)(1) of the Act, 21 U.S.C. section 360bbb-3(b)(1), unless the authorization is terminated or revoked.     Resp Syncytial Virus by PCR NEGATIVE NEGATIVE Final    Comment: (NOTE) Fact Sheet for Patients: BloggerCourse.com  Fact Sheet for Healthcare Providers: SeriousBroker.it  This test is not yet approved or cleared by the [place]  FDA and has been authorized for detection and/or diagnosis of SARS-CoV-2 by FDA under an Emergency Use Authorization (EUA). This EUA will remain in effect (meaning this test can be used) for the duration of the COVID-19 declaration under Section 564(b)(1) of the Act, 21 U.S.C. section 360bbb-3(b)(1), unless the authorization is terminated or revoked.  Performed at [facility], [address], [place], [place] [address]   Respiratory (~20 pathogens) panel by PCR     Status: None   Collection Time: 04/27/24  1:16 PM   Specimen: Nasopharyngeal Swab; Respiratory  Result Value Ref Range Status   Adenovirus NOT DETECTED NOT DETECTED Final   Coronavirus 229E NOT DETECTED NOT DETECTED Final    Comment: (NOTE) The Coronavirus on the Respiratory Panel, DOES NOT test for the novel  Coronavirus (2019 nCoV)    Coronavirus HKU1 NOT DETECTED NOT DETECTED Final   Coronavirus NL63 NOT DETECTED NOT DETECTED Final   Coronavirus OC43 NOT DETECTED NOT DETECTED Final   Metapneumovirus NOT DETECTED NOT DETECTED Final    Rhinovirus / Enterovirus NOT DETECTED NOT DETECTED Final   Influenza A NOT DETECTED NOT DETECTED Final   Influenza B NOT DETECTED NOT DETECTED Final   Parainfluenza Virus 1 NOT DETECTED NOT DETECTED Final   Parainfluenza Virus 2 NOT DETECTED NOT DETECTED Final   Parainfluenza Virus 3 NOT DETECTED NOT DETECTED Final   Parainfluenza Virus 4 NOT DETECTED NOT DETECTED Final   Respiratory Syncytial Virus NOT DETECTED NOT DETECTED Final   Bordetella pertussis NOT DETECTED NOT DETECTED Final   Bordetella Parapertussis NOT DETECTED NOT DETECTED Final   Chlamydophila pneumoniae NOT DETECTED NOT DETECTED Final   Mycoplasma pneumoniae NOT DETECTED NOT DETECTED Final    Comment: Performed at [facility], [address], [place], [place] [address]    Labs: CBC: Recent Labs  Lab 04/26/24 2145 04/27/24 0751 04/28/24 0445 04/29/24 0508 04/30/24 0606  WBC 13.6* 14.2* 13.3* 13.8* 10.5  HGB 15.9* 13.9 14.1 14.3 12.8  HCT 45.0 39.3 39.6 40.3 37.4  MCV 85.1 83.8 84.3 83.6 87.4  PLT 477* 397 344 356 310   Basic Metabolic Panel: Recent Labs  Lab 04/26/24 2145 04/27/24 0751 04/28/24 0445 04/29/24 0507 04/29/24 0508 04/29/24 1434 04/30/24 0606  NA 134*  --  128*  --  128*  --  134*  K 3.4*  --  3.0*  --  2.6* 3.0* 3.9  CL 98  --  94*  --  97*  --  106  CO2 17*  --  23  --  18*  --  19*  GLUCOSE 191*  --  134*  --  120*  --  114*  BUN 7*  --  5*  --  6*  --  7*  CREATININE 0.75 0.72 0.64  --  0.63  --  0.73  CALCIUM  10.1  --  9.4  --  9.0  --  8.9  MG  --   --   --  1.6*  --   --   --    Liver Function Tests: Recent Labs  Lab 04/26/24 2145  AST 22  ALT 17  ALKPHOS 83  BILITOT 0.7  PROT 8.1  ALBUMIN 4.8   CBG: No results for input(s): GLUCAP in the last 168 hours.  Discharge time spent:  36 minutes.  Signed: Ezzard Holms, MD [facility] 04/30/2024

## 2024-04-30 NOTE — Plan of Care (Signed)
  Problem: Education: Goal: Knowledge of General Education information will improve Description: Including pain rating scale, medication(s)/side effects and non-pharmacologic comfort measures Outcome: Progressing   Problem: Health Behavior/Discharge Planning: Goal: Ability to manage health-related needs will improve Outcome: Progressing   Problem: Clinical Measurements: Goal: Ability to maintain clinical measurements within normal limits will improve Outcome: Progressing Goal: Will remain free from infection Outcome: Progressing Goal: Diagnostic test results will improve Outcome: Progressing Goal: Respiratory complications will improve Outcome: Progressing Goal: Cardiovascular complication will be avoided Outcome: Progressing   Problem: Skin Integrity: Goal: Risk for impaired skin integrity will decrease Outcome: Progressing   Problem: Safety: Goal: Ability to remain free from injury will improve Outcome: Progressing   Problem: Pain Managment: Goal: General experience of comfort will improve and/or be controlled Outcome: Progressing

## 2024-04-30 NOTE — Plan of Care (Signed)

## 2024-05-19 ENCOUNTER — Encounter (HOSPITAL_BASED_OUTPATIENT_CLINIC_OR_DEPARTMENT_OTHER): Payer: Self-pay | Admitting: *Deleted

## 2024-05-19 ENCOUNTER — Other Ambulatory Visit: Payer: Self-pay

## 2024-05-19 ENCOUNTER — Emergency Department (HOSPITAL_BASED_OUTPATIENT_CLINIC_OR_DEPARTMENT_OTHER)
Admission: EM | Admit: 2024-05-19 | Discharge: 2024-05-19 | Disposition: A | Discharge status: Home / Self Care | Attending: Emergency Medicine | Admitting: Emergency Medicine

## 2024-05-19 DIAGNOSIS — R000 Tachycardia, unspecified: Secondary | ICD-10-CM | POA: Diagnosis not present

## 2024-05-19 DIAGNOSIS — Z7951 Long term (current) use of inhaled steroids: Secondary | ICD-10-CM | POA: Insufficient documentation

## 2024-05-19 DIAGNOSIS — Z79899 Other long term (current) drug therapy: Secondary | ICD-10-CM | POA: Diagnosis not present

## 2024-05-19 DIAGNOSIS — R1084 Generalized abdominal pain: Secondary | ICD-10-CM | POA: Insufficient documentation

## 2024-05-19 DIAGNOSIS — R110 Nausea: Secondary | ICD-10-CM | POA: Insufficient documentation

## 2024-05-19 DIAGNOSIS — R112 Nausea with vomiting, unspecified: Secondary | ICD-10-CM | POA: Diagnosis not present

## 2024-05-19 DIAGNOSIS — R1110 Vomiting, unspecified: Secondary | ICD-10-CM | POA: Diagnosis not present

## 2024-05-19 DIAGNOSIS — R064 Hyperventilation: Secondary | ICD-10-CM | POA: Diagnosis not present

## 2024-05-19 DIAGNOSIS — I10 Essential (primary) hypertension: Secondary | ICD-10-CM | POA: Insufficient documentation

## 2024-05-19 DIAGNOSIS — J45909 Unspecified asthma, uncomplicated: Secondary | ICD-10-CM | POA: Diagnosis not present

## 2024-05-19 LAB — COMPREHENSIVE METABOLIC PANEL WITH GFR
ALT: 20 U/L (ref 0–44)
AST: 20 U/L (ref 15–41)
Albumin: 5 g/dL (ref 3.5–5.0)
Alkaline Phosphatase: 86 U/L (ref 38–126)
Anion gap: 17 — ABNORMAL HIGH (ref 5–15)
BUN: 10 mg/dL (ref 8–23)
CO2: 21 mmol/L — ABNORMAL LOW (ref 22–32)
Calcium: 10.7 mg/dL — ABNORMAL HIGH (ref 8.9–10.3)
Chloride: 102 mmol/L (ref 98–111)
Creatinine, Ser: 0.66 mg/dL (ref 0.44–1.00)
GFR, Estimated: >60 mL/min (ref >60)
Glucose, Bld: 142 mg/dL — ABNORMAL HIGH (ref 70–99)
Potassium: 3.6 mmol/L (ref 3.5–5.1)
Sodium: 139 mmol/L (ref 135–145)
Total Bilirubin: 0.5 mg/dL (ref 0.0–1.2)
Total Protein: 8.2 g/dL — ABNORMAL HIGH (ref 6.5–8.1)

## 2024-05-19 LAB — CBC WITH DIFFERENTIAL/PLATELET
Abs Immature Granulocytes: 0.03 10*3/uL (ref 0.00–0.07)
Basophils Absolute: 0.1 10*3/uL (ref 0.0–0.1)
Basophils Relative: 1 %
Eosinophils Absolute: 0 10*3/uL (ref 0.0–0.5)
Eosinophils Relative: 0 %
HCT: 41.5 % (ref 36.0–46.0)
Hemoglobin: 14.5 g/dL (ref 12.0–15.0)
Immature Granulocytes: 0 %
Lymphocytes Relative: 14 %
Lymphs Abs: 1.2 10*3/uL (ref 0.7–4.0)
MCH: 30.1 pg (ref 26.0–34.0)
MCHC: 34.9 g/dL (ref 30.0–36.0)
MCV: 86.3 fL (ref 80.0–100.0)
Monocytes Absolute: 0.3 10*3/uL (ref 0.1–1.0)
Monocytes Relative: 3 %
Neutro Abs: 7.2 10*3/uL (ref 1.7–7.7)
Neutrophils Relative %: 82 %
Platelets: 399 10*3/uL (ref 150–400)
RBC: 4.81 MIL/uL (ref 3.87–5.11)
RDW: 13.9 % (ref 11.5–15.5)
WBC: 8.7 10*3/uL (ref 4.0–10.5)
nRBC: 0 % (ref 0.0–0.2)

## 2024-05-19 LAB — LIPASE, BLOOD: Lipase: 13 U/L (ref 11–51)

## 2024-05-19 MED ORDER — KETOROLAC TROMETHAMINE 30 MG/ML IJ SOLN
30.0000 mg | Freq: Once | INTRAMUSCULAR | Status: AC
Start: 2024-05-19 — End: 2024-05-19
  Administered 2024-05-19: 30 mg via INTRAVENOUS
  Filled 2024-05-19: qty 1

## 2024-05-19 MED ORDER — DROPERIDOL 2.5 MG/ML IJ SOLN
1.2500 mg | Freq: Once | INTRAMUSCULAR | Status: AC
  Administered 2024-05-19: 1.25 mg via INTRAVENOUS
  Filled 2024-05-19: qty 2

## 2024-05-19 MED ORDER — SODIUM CHLORIDE 0.9 % IV BOLUS
1000.0000 mL | Freq: Once | INTRAVENOUS | Status: AC
  Administered 2024-05-19: 1000 mL via INTRAVENOUS

## 2024-05-19 MED ORDER — LIDOCAINE VISCOUS HCL 2 % MT SOLN
15.0000 mL | Freq: Once | OROMUCOSAL | Status: AC
  Administered 2024-05-19: 15 mL via ORAL
  Filled 2024-05-19: qty 15

## 2024-05-19 MED ORDER — CAPSAICIN 0.025 % EX CREA
TOPICAL_CREAM | Freq: Once | CUTANEOUS | Status: AC
Start: 2024-05-19 — End: 2024-05-19
  Filled 2024-05-19: qty 60

## 2024-05-19 MED ORDER — ALUM & MAG HYDROXIDE-SIMETH 200-200-20 MG/5ML PO SUSP
30.0000 mL | Freq: Once | ORAL | Status: AC
  Administered 2024-05-19: 30 mL via ORAL
  Filled 2024-05-19: qty 30

## 2024-05-19 MED ORDER — DIAZEPAM 5 MG/ML IJ SOLN
2.5000 mg | Freq: Once | INTRAMUSCULAR | Status: AC
  Administered 2024-05-19: 2.5 mg via INTRAVENOUS
  Filled 2024-05-19: qty 2

## 2024-05-19 NOTE — ED Triage Notes (Signed)
 Patient to ED POV reporting vomiting and severe upper abd pain since 9am this morning. No known fevers. No diarrhea.   Patient breathing uncontrollably in triage and crying out.   Friend at bedside reports increased anxiety around fathers declining health recently.

## 2024-05-19 NOTE — ED Provider Notes (Signed)
 [place] EMERGENCY DEPARTMENT AT [facility] Provider Note   CSN: [id number] Arrival date & time: 05/19/24  1620     Patient presents with: Abdominal Pain and Emesis   Katie Chang is a 63 y.o. female who presents with a cc vomiting. Onset 9:00 am . 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  Patient reports severe abdominal pain and vomiting.She reports that it feels the same as previous episodes. Patient's friend at bedside offers that she has been  smoking less and is under significant stress at home with her elderly parent's health issues. She denies fever, chills, urinary sxs. History is difficult to obtain from the patient due to current condition.    Abdominal Pain Associated symptoms: vomiting   Emesis Associated symptoms: abdominal pain        Prior to Admission medications   Medication Sig Start Date End Date Taking? Authorizing Provider  albuterol  (PROAIR  HFA) 108 (90 BASE) MCG/ACT inhaler Inhale 2 puffs into the lungs every 6 (six) hours as needed for wheezing. 03/11/13   Tiwanna Tuch Bernardino HERO, PA-C  amLODipine  (NORVASC ) 5 MG tablet Take 5 mg by mouth daily. 03/29/22   [provider]  celecoxib (CELEBREX) 200 MG capsule Take 200 mg by mouth daily. 05/23/22   [provider]  cholecalciferol  (VITAMIN D3) 25 MCG (1000 UT) tablet Take 1,000 Units by mouth daily.    [provider]  esomeprazole (NEXIUM) 40 MG capsule Take 40 mg by mouth daily. 03/04/18   [provider]  ferrous sulfate 325 (65 FE) MG EC tablet Take 325 mg by mouth in the morning and at bedtime.    [provider]  fluticasone  (FLONASE ) 50 MCG/ACT nasal spray Place 2 sprays into both nostrils daily as needed for allergies.    [provider]  levocetirizine (XYZAL ) 5 MG tablet Take 0.5 tablets (2.5 mg total) by mouth every evening. Patient taking differently: Take 5 mg by mouth every evening. 03/03/23   Briana Elgin LABOR, MD   lidocaine  (LIDODERM ) 5 % Apply to right sacrum as needed (PRN). Remove & Discard patch within 12 hours and wait 12 hours before applying another patch. Patient taking differently: Place 1 patch onto the skin daily as needed (pain). 03/25/24   Rizwan, Saima, MD  LORazepam  (ATIVAN ) 1 MG tablet Take 1 mg by mouth at bedtime. 04/09/18   [provider]  magnesium  hydroxide (PHILLIPS CHEWS) 311 MG CHEW chewable tablet Chew 311 mg by mouth at bedtime.    [provider]  metoprolol  succinate (TOPROL -XL) 25 MG 24 hr tablet Take 25 mg by mouth daily. 04/12/22   [provider]  montelukast  (SINGULAIR ) 10 MG tablet TAKE ONE TABLET AT BEDTIME. Patient taking differently: Take 10 mg by mouth every morning. 04/22/14   Ortega, Kristi M, MD  nicotine  (NICODERM CQ  - DOSED IN MG/24 HR) 7 mg/24hr patch Place 1 patch (7 mg total) onto the skin daily. Patient not taking: Reported on 04/27/2024 04/24/24 04/24/25  Rizwan, Saima, MD  ondansetron  (ZOFRAN -ODT) 4 MG disintegrating tablet Take 4 mg by mouth every 8 (eight) hours as needed for nausea or vomiting. 04/20/24   [provider]  Prasterone, DHEA, (DHEA PO) Take 1 capsule by mouth daily.    [provider]  promethazine  (PHENERGAN ) 25 MG suppository Place 1 suppository (25 mg total) rectally every 6 (six) hours as needed for nausea or vomiting. 03/21/24   Silver Wonda LABOR, PA  rosuvastatin  (CRESTOR ) 10  MG tablet Take 10 mg by mouth See admin instructions. Take 10mg  by mouth Monday thru Friday. 03/29/22   [provider]  UNABLE TO FIND Take 1 tablet by mouth at bedtime as needed (sleep). Med Name: Delta 8    [provider]  valsartan (DIOVAN) 80 MG tablet Take 80 mg by mouth at bedtime. 03/07/22   [provider]  venlafaxine  (EFFEXOR ) 37.5 MG tablet Take 112.5 mg by mouth daily. 07/31/22   [provider]    Allergies: Avelox [moxifloxacin hcl in nacl] and Haldol  [haloperidol  lactate]    Review  of Systems  Gastrointestinal:  Positive for abdominal pain and vomiting.    Updated Vital Signs BP (!) 162/104   Pulse 100   Temp 98.8 F (37.1 C) (Oral)   Resp 19   SpO2 100%   Physical Exam Constitutional:      Appearance: She is well-developed.     Comments: The patient exhibits marked anxiety, demonstrated by rapid, shallow breathing and restlessness.    HENT:     Head: Normocephalic and atraumatic.     Mouth/Throat:     Mouth: Mucous membranes are dry.  Eyes:     Extraocular Movements: Extraocular movements intact.     Pupils: Pupils are equal, round, and reactive to light.  Cardiovascular:     Rate and Rhythm: Tachycardia present.  Pulmonary:     Comments: hyperventillation Abdominal:     Palpations: Abdomen is soft.     Tenderness: There is generalized abdominal tenderness. There is no guarding or rebound.  Skin:    General: Skin is warm.  Psychiatric:        Mood and Affect: Mood is anxious. Affect is tearful.     Comments: The patient's affect appears exaggerated and emotionally labile, with excessive concern over symptoms. She appears to exhibit psychological distress     (all labs ordered are listed, but only abnormal results are displayed) Labs Reviewed  COMPREHENSIVE METABOLIC PANEL WITH GFR - Abnormal; Notable for the following components:      Result Value   CO2 21 (*)    Glucose, Bld 142 (*)    Calcium  10.7 (*)    Total Protein 8.2 (*)    Anion gap 17 (*)    All other components within normal limits  CBC WITH DIFFERENTIAL/PLATELET  LIPASE, BLOOD    EKG: EKG Interpretation Date/Time:  Wednesday May 19 2024 17:16:38 EDT Ventricular Rate:  101 PR Interval:  194 QRS Duration:  96 QT Interval:  367 QTC Calculation: 476 R Axis:   70  Text Interpretation: Sinus tachycardia LAE, consider biatrial enlargement Left ventricular hypertrophy Confirmed by Yolande Charleston [phone]) on 05/19/2024 5:51:36 PM  Radiology: No results found.   Procedures    Medications Ordered in the ED  diazepam  (VALIUM ) injection 2.5 mg (2.5 mg Intravenous Given 05/19/24 1728)  droperidol  (INAPSINE ) 2.5 MG/ML injection 1.25 mg (1.25 mg Intravenous Given 05/19/24 1727)  capsaicin  (ZOSTRIX) 0.025 % cream ( Topical Given 05/19/24 1756)  alum & mag hydroxide-simeth (MAALOX/MYLANTA) 200-200-20 MG/5ML suspension 30 mL (30 mLs Oral Given 05/19/24 1911)    And  lidocaine  (XYLOCAINE ) 2 % viscous mouth solution 15 mL (15 mLs Oral Given 05/19/24 1911)  ketorolac  (TORADOL ) 30 MG/ML injection 30 mg (30 mg Intravenous Given 05/19/24 1911)  sodium chloride  0.9 % bolus 1,000 mL (0 mLs Intravenous Stopped 05/19/24 2059)    Clinical Course as of 05/20/24 1701  Wed May 19, 2024  1842 Anion gap(!): 17 Anion  gap 17 likely due to GI loss [AH]  1842 I visualized and interpreted EKG which shows sinus tachycardia at a rate of 101 with QTc of 476 [AH]  1843 Lipase, blood [AH]  1843 CBC with Differential Lipase and CBC within normal limits [AH]  2027 Patient's lab values are unremarkable.  She has no hypokalemia.  I went to reevaluate the patient who was laying in the bed facing away from the door, quietly, with a normal breathing pattern. She appeared far less restless.  Upon coming to the side of the bed to discuss findings the patient began to exhibit the same symptoms of rapid hyperventilation, restlessness with heightened anxiety and emotional distress, representing a sudden change in her demeanor.   I was able to communicate the findings, reassess her abdomen ( benign to palpation) and discuss plans for discharge after fluids have finished infusing.  During the visit the patient has not been observed vomiting, gagging or retching.  [AH]    Clinical Course User Index [AH] Arloa Chroman, PA-C                                 Medical Decision Making Amount and/or Complexity of Data Reviewed Labs: ordered. Decision-making details documented in ED Course. ECG/medicine tests:  ordered.  Risk OTC drugs. Prescription drug management.   This patient presents to the ED for concern of generalized abdominal pain and vomiting, this involves an extensive number of treatment options, and is a complaint that carries with it a high risk of complications and morbidity.   The emergent differential diagnosis for vomiting includes, but is not limited to ACS/MI, DKA, Ischemic bowel, Meningitis, Sepsis, Acute gastric dilation, Adrenal insufficiency, Appendicitis,  Bowel obstruction/ileus, Carbon monoxide poisoning, Cholecystitis, Electrolyte abnormalities, Elevated ICP, Gastric outlet obstruction, Pancreatitis, Ruptured viscus, Biliary colic, Cannabinoid hyperemesis syndrome, Gastritis, Gastroenteritis, Gastroparesis,  Narcotic withdrawal, Peptic ulcer disease, and UTI   Co morbidities:    has a past medical history of Allergy, Arthritis, Asthma, Cervical dysplasia, Cyclic vomiting syndrome, DDD (degenerative disc disease), cervical, Depression, Eczema, GERD (gastroesophageal reflux disease), Hypertension, Menopausal symptoms, Migraines, PONV (postoperative nausea and vomiting), and Recurrent sinus infections.   Social Determinants of Health:    SDOH Screenings   Food Insecurity: No Food Insecurity (04/27/2024)  Housing: Low Risk  (04/27/2024)  Transportation Needs: No Transportation Needs (04/27/2024)  Utilities: Not At Risk (04/27/2024)  Depression (PHQ2-9): High Risk (02/12/2023)  Social Connections: Socially Isolated (04/27/2024)  Stress: No Stress Concern Present (12/31/2022)  Tobacco Use: High Risk (05/19/2024)     Additional history:  {Additional history obtained from friend at bedside Records from EMR andoutside source obtained and reviewed including Previous surgical and admission documents  Lab Tests:  I Ordered, and personally interpreted labs.  The pertinent results include:   As Per ED course Hepatic and Renal function within normal limits Calcium  10.7,  slightly above normal but  does not represent an acute abnormlity Glucose 142 probale acute phase reactant in the setting of pain or mild inflammatory process CO2 21 with slight anion gap- may be due to previous GI loss or hyperventillation No other electrolyte abnormalities and No noted Leukocytosis on blood count   Imaging Studies:  N/A  Cardiac Monitoring/ECG:  The patient was maintained on a cardiac monitor.  I personally viewed and interpreted the cardiac monitored which showed an underlying rhythm of:   As per ED course Patient initial vitals showed sinus tachycardia- resolved  with fluids and pain medications.  Medicines ordered and prescription drug management:  I ordered medication including  Medications  diazepam  (VALIUM ) injection 2.5 mg (2.5 mg Intravenous Given 05/19/24 1728)  droperidol  (INAPSINE ) 2.5 MG/ML injection 1.25 mg (1.25 mg Intravenous Given 05/19/24 1727)  capsaicin  (ZOSTRIX) 0.025 % cream ( Topical Given 05/19/24 1756)  alum & mag hydroxide-simeth (MAALOX/MYLANTA) 200-200-20 MG/5ML suspension 30 mL (30 mLs Oral Given 05/19/24 1911)    And  lidocaine  (XYLOCAINE ) 2 % viscous mouth solution 15 mL (15 mLs Oral Given 05/19/24 1911)  ketorolac  (TORADOL ) 30 MG/ML injection 30 mg (30 mg Intravenous Given 05/19/24 1911)  sodium chloride  0.9 % bolus 1,000 mL (0 mLs Intravenous Stopped 05/19/24 2059)   for  Reevaluation of the patient after these medicines showed that the patient improved I have reviewed the patients home medicines and have made adjustments as needed  Test Considered:   I considered CT imaging of the patient's abdomen pelvis but patient has no active vomiting and a benign abdominal exam.  Patient's exam is not tender and follow-up examination  Critical Interventions:   fluids, antiemetics, pain and nausea control using droperidol , Toradol , Valium , viscous lidocaine  and Maalox combination  Consultations Obtained:   Problem List / ED Course:      ICD-10-CM   1. Nausea  R11.0     2. Hyperventilation  R06.4       MDM: Patient here with cyclic nausea and vomiting.  I have a high suspicion for both an underlying anxiety component versus extreme anxiousness and distress associated with her underlying discomfort due to suspected CHS. The patient's labs are reassuring during today's visit as she has had previously been admitted for multiple electrolyte disturbances due to her vomiting.  During the patient's visit the patient did not have any active vomiting..  I feel that she is safe for discharge at this time and has held down oral medications including viscous lidocaine  and Maalox without vomiting here in the emergency department.  She should follow closely with her primary care physician   Dispostion:  After consideration of the diagnostic results and the patients response to treatment, I feel that the patent would benefit from discharge with strict return precautions.      Final diagnoses:  Nausea  Hyperventilation    ED Discharge Orders     None            Arloa Chroman, PA-C 05/21/24 1750    Yolande Lamar BROCKS, MD 05/25/24 [phone]

## 2024-05-19 NOTE — ED Notes (Signed)
 Pt out of ED via [facility] with friend. A/Ox4 not in visible distress.

## 2024-05-19 NOTE — Discharge Instructions (Signed)
Get help right away if: You cannot stop vomiting. You have blood in your vomit or your vomit looks like coffee grounds. You have severe abdominal pain. You have stools that are bloody or black, or stools that look like tar. You have symptoms of dehydration, such as: Sunken eyes. Inability to make tears. Cracked lips or dry mouth. Decreased urine production. Weakness. Sleepiness. Dizziness, light-headedness, or fainting. These symptoms may be an emergency. Get help right away. Call 911. Do not wait to see if the symptoms will go away. Do not drive yourself to the hospital.

## 2024-07-01 DIAGNOSIS — G43A0 Cyclical vomiting, not intractable: Secondary | ICD-10-CM | POA: Diagnosis not present

## 2024-09-22 DIAGNOSIS — G43A0 Cyclical vomiting, not intractable: Secondary | ICD-10-CM | POA: Diagnosis not present

## 2024-09-22 DIAGNOSIS — F1210 Cannabis abuse, uncomplicated: Secondary | ICD-10-CM | POA: Diagnosis not present
